# Patient Record
Sex: Female | Born: 1935
Health system: Southern US, Community
[De-identification: ages and names within clinical notes are randomized; demographics above are authoritative.]

## PROBLEM LIST (undated history)

## (undated) DIAGNOSIS — E785 Hyperlipidemia, unspecified: Secondary | ICD-10-CM

## (undated) DIAGNOSIS — M1990 Unspecified osteoarthritis, unspecified site: Secondary | ICD-10-CM

## (undated) DIAGNOSIS — K219 Gastro-esophageal reflux disease without esophagitis: Secondary | ICD-10-CM

## (undated) DIAGNOSIS — E039 Hypothyroidism, unspecified: Secondary | ICD-10-CM

## (undated) DIAGNOSIS — L570 Actinic keratosis: Secondary | ICD-10-CM

## (undated) DIAGNOSIS — J45909 Unspecified asthma, uncomplicated: Secondary | ICD-10-CM

## (undated) HISTORY — PX: THYROIDECTOMY: SHX17

## (undated) HISTORY — DX: Actinic keratosis: L57.0

## (undated) HISTORY — DX: Hyperlipidemia, unspecified: E78.5

## (undated) HISTORY — DX: Unspecified asthma, uncomplicated: J45.909

## (undated) HISTORY — PX: TOTAL KNEE ARTHROPLASTY: SHX125

## (undated) HISTORY — DX: Unspecified osteoarthritis, unspecified site: M19.90

## (undated) HISTORY — DX: Gastro-esophageal reflux disease without esophagitis: K21.9

## (undated) HISTORY — DX: Hypothyroidism, unspecified: E03.9

## (undated) HISTORY — PX: TONSILLECTOMY: SUR1361

## (undated) HISTORY — PX: VAGINAL HYSTERECTOMY: SUR661

## (undated) HISTORY — PX: CATARACT EXTRACTION: SUR2

---

## 2001-07-20 ENCOUNTER — Encounter: Payer: Self-pay | Admitting: Otolaryngology

## 2001-07-20 ENCOUNTER — Ambulatory Visit (HOSPITAL_COMMUNITY): Admission: RE | Admit: 2001-07-20 | Discharge: 2001-07-20 | Payer: Self-pay | Admitting: Otolaryngology

## 2002-07-09 ENCOUNTER — Encounter: Payer: Self-pay | Admitting: Family Medicine

## 2002-07-09 ENCOUNTER — Ambulatory Visit (HOSPITAL_COMMUNITY): Admission: RE | Admit: 2002-07-09 | Discharge: 2002-07-09 | Payer: Self-pay | Admitting: Family Medicine

## 2005-06-27 ENCOUNTER — Observation Stay (HOSPITAL_COMMUNITY): Admission: RE | Admit: 2005-06-27 | Discharge: 2005-06-29 | Payer: Self-pay | Admitting: Obstetrics and Gynecology

## 2005-06-27 ENCOUNTER — Encounter: Payer: Self-pay | Admitting: Obstetrics and Gynecology

## 2006-06-28 ENCOUNTER — Ambulatory Visit (HOSPITAL_COMMUNITY): Admission: RE | Admit: 2006-06-28 | Discharge: 2006-06-28 | Payer: Self-pay | Admitting: Ophthalmology

## 2010-06-01 ENCOUNTER — Ambulatory Visit (HOSPITAL_COMMUNITY): Admission: RE | Admit: 2010-06-01 | Discharge: 2010-06-01 | Payer: Self-pay | Admitting: Ophthalmology

## 2010-10-20 LAB — POCT I-STAT 4, (NA,K, GLUC, HGB,HCT)
Glucose, Bld: 104 mg/dL — ABNORMAL HIGH (ref 70–99)
HCT: 41 % (ref 36.0–46.0)
Hemoglobin: 13.9 g/dL (ref 12.0–15.0)
Potassium: 3.6 mEq/L (ref 3.5–5.1)
Sodium: 139 mEq/L (ref 135–145)

## 2010-12-24 NOTE — Group Therapy Note (Signed)
[place], Lindsey Butler               ACCOUNT NO.:  [id number]   MEDICAL RECORD NO.:  [id number]          PATIENT TYPE:  OBV   LOCATION:  A428                          FACILITY:  [facility]   PHYSICIAN:  Tilda Burrow, M.D. DATE OF BIRTH:  January 31, 1929   DATE OF PROCEDURE:  06/28/2005  DATE OF DISCHARGE:                                   PROGRESS NOTE   9 a.m., June 28, 2005.   [name] is having an unusually heavy amount of clots and blood per vagina.  She underwent posterior repair, hemorrhoidectomy, and enterocele repair  yesterday with vagina packing removed at midnight. She was taken to the  bathroom but discontinued due to grogginess from Phenergan given due to the  patient's nausea. She required 25 mg for the nausea, but this made her  extremely unsteady. Hemoglobin and hematocrit are 12.0 and 35.1 compared to  yesterday, 13.6 and 38.9, considerably reasonable for the hemodilution of  continuous IV at 100 cc an hour overnight. Urine output has been quite good  since Foley catheter was reinserted. Speculum exam shows that she has had  large amounts of clots at the vaginal apex which were extracted. Then we sat  there and watched and did not see any evidence of continued active bleeding.  Speculum was removed. We waited a few minutes and reinserted, and there is  indeed just a little bit of slow passive ooze. The fact that she cannot  identify an active site makes me think that she might respond to simple  packing for an additional period of time. Foley catheter is left in place.  Vaginal packing with Betadine-soaked gauze was inserted. Bowel sounds are  listened for and seem low normal activity. There is no hyperactivity, and  currently, patient has no bloating and specifically has no shoulder pain. We  will keep an additional day. Will give an additional dose of antibiotics due  to the manipulations and her elevated white count of 15,300 with 88  neutrophils. Convert to  admission.      Tilda Burrow, M.D.  Electronically Signed     JVF/MEDQ  D:  06/28/2005  T:  06/28/2005  Job:  [id number]

## 2010-12-24 NOTE — H&P (Signed)
NAMEMARKAN, CAZAREZ               ACCOUNT NO.:  [id number]   MEDICAL RECORD NO.:  [id number]          PATIENT TYPE:  AMB   LOCATION:  DAY                           FACILITY:  [facility]   PHYSICIAN:  Tilda Burrow, M.D. DATE OF BIRTH:  1929/05/10   DATE OF ADMISSION:  06/27/2005  DATE OF DISCHARGE:  LH                                HISTORY & PHYSICAL   ADMISSION DIAGNOSES:  1.  Rectocele.  2.  External hemorrhoids.  3.  Possible enterocele.   HISTORY OF PRESENT ILLNESS:  This 77 year old female is complaining of  symptomatic bulge at the vaginal introitus is found to have a rectocele  originating high in the posterior vaginal wall.  Digital rectal exam shows  that the lower one-third of the vagina is adequately supported, but above it  there is a large bulge that is distensible and stretches all the way to the  introitus.  There is no palpable enterocele, but there is possibility of  adjacent enterocele being encountered at the time of surgery.  The patient  has vaginal cuff laxity.  She additionally has an external hemorrhoid that  is symptomatic.  Plans are for trimming of the hemorrhoid at the time of the  rectocele repair.   PAST MEDICAL HISTORY:  1.  Osteoarthritis.  2.  GERD.  3.  Hypercholesterolemia.  4.  Hypothyroidism.  5.  Allergic rhinitis.   PAST SURGICAL HISTORY:  1.  Vaginal hysterectomy years ago.  2.  Tonsillectomy in 1950.  3.  Appendectomy in 1950.  4.  Left knee replacement.   The patient has had this rectocele since 2000, but it has only recently  become sufficiently uncomfortable for her to seek repair.   ALLERGIES:  None.   MEDICATIONS:  Nexium, Synthroid, Zeta.   PHYSICAL EXAMINATION:  GENERAL:  Slim, Caucasian female, alert and oriented  x3, who appears her stated age.  HEENT:  Pupils equal, round, and reactive.  NECK:  Supple.  Trachea midline.  Status post well-healed thyroidectomy  scar.  CARDIAC: Regular rate and rhythm.  No murmurs  appreciated.  LUNGS:  Clear.  ABDOMEN:  Nontender.  RECTAL/PELVIC:  High rectocele noted on digital rectal exam.  External  hemorrhoid present. Cervix and uterus absent.  Adnexa nontender.  External  hemorrhoid present anteriorly.   PLAN:  Posterior repair with hemorrhoidectomy June 27, 2005.      Tilda Burrow, M.D.  Electronically Signed     JVF/MEDQ  D:  06/22/2005  T:  06/22/2005  Job:  [id number]   cc:   [place], Attention: Dr. Reuel Boom

## 2010-12-24 NOTE — Op Note (Signed)
NAMEVIRGIL, Lindsey Butler               ACCOUNT NO.:  [id number]   MEDICAL RECORD NO.:  [id number]          PATIENT TYPE:  OBV   LOCATION:  A428                          FACILITY:  [facility]   PHYSICIAN:  Tilda Burrow, M.D. DATE OF BIRTH:  March 17, 1929   DATE OF PROCEDURE:  06/27/2005  DATE OF DISCHARGE:                                 OPERATIVE REPORT   PREOPERATIVE DIAGNOSIS:  Rectocele, possible enterocele, external  hemorrhoids.   POSTOPERATIVE DIAGNOSIS:  Rectocele, possible enterocele, external  hemorrhoids.   PROCEDURE:  Rectocele repair, enterocele repair, external hemorrhoidectomy.   SURGEON:  Dr. Emelda Fear.   ASSISTANT:  Small, R.N.   ANESTHESIA:  Spinal, Daniel, C.R.NA.   COMPLICATIONS:  None.   FINDINGS:  Extensive laxity in the perineal body and large enterocele in  front of a high rectocele, with good pelvic support tissues identified.   DETAILS OF PROCEDURE:  The patient was taken to the operating room, prepped  and draped for vaginal procedure with legs supported carefully with high  lithotomy support. The perineal body was prepped and draped. Large  hemorrhoidal skin tags noted. We used a vaginal bib to separate the rectal  area from the vaginal area. The patient had an enlarged bulging rectocele  which upon contact caused the expulsion of additional fecal material. This  did not contaminate the surgical field for the vaginal portion of the  procedure. We then proceeded with splitting of posterior vaginal mucosa at  the lower two thirds of the vagina from the hymen ring remnants upwards for  two thirds length of the vagina. We then proceeded to split the vaginal  mucosa off of the underlying connective tissue. There was an obvious  enterocele. Digital rectal exam could then be performed cautiously using  double-gloved technique, and we were able to identify the rectocele as  separate from the enterocele, opened the enterocele, peeled the enterocele  mucosa away  from the underlying connective tissue. Allis clamps could be  placed laterally at the small amount of remaining supportive tissues that  felt to represent the tips of the uterosacral ligament. These were grasped  and tagged with Allis clamps. The double-gloved hand was extracted, gloves  changed, and then proceeded with trimming the enterocele and placing a purse-  string around the edges of the remaining tissues. A series of horizontal  mattress sutures using 0 Vicryl was then performed to pull the remnants of  the uterosacral ligaments together in the midline, and then we used Allis  clamps down the side to pull perirectal tissues together in the midline to  rebuild the perineal body and the posterior rectovaginal septum. This  resulted in good tissue edge approximation and good anterior support. This  anterior process was performed with once again a double gloved right index  finger in the rectum in order to better identify the limits of bowel. At no  time was there any suspicion bowel preparation. The series of horizontal  mattress sutures were placed followed by trimming redundant vaginal mucosa,  and then 2-0 chromic was used to trim the vaginal mucosa posteriorly. We  elevated  the rectal tissues during the prior layers of sutures so this  entire process resulted in excellent tissue reapproximation. Once the  vaginal procedure had been completed, we placed a Foley catheter, obtained a  generous amount of clear urine, and placed Betadine-soaked vaginal packing.   Hemorrhoidectomy. The vaginal bib was then removed, a very loose elastic  redundant skin tissue was then removed cross clamping transversely, excising  the redundant tissue and then using interrupted 2-0 chromic sutures to pull  together the skin edges. The patient tolerated the procedure well. Of note,  she had three lateral large bulging external hemorrhoids which were much  more prominent now that she had finished her bowel  prep than before. We  considered whether to leave those or whether to attempt to repair those, but  considering they would require deeper work and that the bowel had not  resulted in an optimal evacuation and there had been contamination, we  decided to defer this until a later date with probable surgeon referral.      Tilda Burrow, M.D.  Electronically Signed     JVF/MEDQ  D:  06/27/2005  T:  06/27/2005  Job:  [id number]

## 2010-12-24 NOTE — Discharge Summary (Signed)
[place] Butler, Lindsey Butler               ACCOUNT NO.:  [id number]   MEDICAL RECORD NO.:  [id number]          PATIENT TYPE:  INP   LOCATION:  A428                          FACILITY:  [facility]   PHYSICIAN:  Tilda Burrow, M.D. DATE OF BIRTH:  09-20-1928   DATE OF ADMISSION:  06/27/2005  DATE OF DISCHARGE:  LH                                 DISCHARGE SUMMARY   ADMISSION DIAGNOSES:  1.  Rectocele.  2.  External hemorrhoids.  3.  Possible enterocele.   DISCHARGE DIAGNOSES:  1.  Rectocele.  2.  Enterocele.  3.  External hemorrhoids.   PROCEDURE:  Posterior repair, enterocele repair, hemorrhoidectomy, Tilda Burrow, M.D., June 27, 2005.   DISCHARGE MEDICATIONS:  1.  Tylox 0.1 mg p.o. daily.  2.  Lisinopril/hydrochlorothiazide 20 mg/12.5 mg p.o. daily.  3.  Zetia 10 mg p.o. daily.  4.  Nexium 40 mg p.o. daily.  5.  Doxycycline 100 mg b.i.d. for 5 days.   HOSPITAL SUMMARY:  This 77 year old female was admitted June 27, 2005  for posterior repair and hemorrhoidectomy with enterocele identified during  the surgery and closed as described in the operative note. Postoperative day  #1 was notable for a greater than expected amount of bleeding after the  vaginal packing was removed. She had a hemoglobin of 12, hematocrit 35  compared to her preoperative values of 13.6 and 38.9. speculum exam showed  that the bleeding was at the vaginal apex. There was no continued active  bleeding, but numerous clots had accumulated beneath the overnight vaginal  packing. She had repacking inserted for an additional precautionary 24 hours  with Foley catheter reinserted when she did not void. She did well over the  following 24  hours and had resolution of the initial postoperative nausea with tolerance  of a regular diet and remained afebrile with white count mildly elevated at  15,300. She was discharged home June 29, 2005 in excellent condition for  followup two weeks in our  office.      Tilda Burrow, M.D.  Electronically Signed     JVF/MEDQ  D:  06/29/2005  T:  06/29/2005  Job:  [id number]   cc:   [facility]   Donzetta Sprung  Fax: [phone]

## 2011-05-05 DIAGNOSIS — S93409A Sprain of unspecified ligament of unspecified ankle, initial encounter: Secondary | ICD-10-CM | POA: Insufficient documentation

## 2012-05-22 ENCOUNTER — Encounter: Payer: Self-pay | Admitting: Internal Medicine

## 2012-05-22 ENCOUNTER — Ambulatory Visit (INDEPENDENT_AMBULATORY_CARE_PROVIDER_SITE_OTHER): Payer: Medicare Other | Admitting: Internal Medicine

## 2012-05-22 VITALS — BP 142/86 | HR 93 | Temp 97.5°F | Ht 65.5 in | Wt 158.4 lb

## 2012-05-22 DIAGNOSIS — R05 Cough: Secondary | ICD-10-CM

## 2012-05-22 NOTE — Progress Notes (Signed)
Subjective:    Patient ID: Lindsey Butler, female    DOB: 1929-03-25, 78 y.o.   MRN: [id number]  HPI  PCP is DANIEL, TERRY, MD  IOV  78 year old Body mass index is 25.96 kg/(m^2).  reports that she has never smoked. Poor historian but outside chart review seems to correlate with her hx below    Reports cough and associated wheeze on and off x 2 years. Insidious onset. Stable since onset but with ups and downs. Coughs daily and mostly at day and only rarely at night. Quality of cough is dry. Aggravated by bending over and walking and wakinhg up early in morning. Improved at night when she sleeps, sitting quietly and also transiently by nebs. She gives hx of  prednisone bursts on and off x 2  Years with total 5 bursts with each course lasting 5-10 days. Currently on 4 month prednisone course that started 2 months ago and she has 2 more months to go. She feels cough is severe and driving her crazy. Visits PMD atleast q2-3 months for cough. RSI cough score is 38 and strongly indicates LPR cough  Does have associated symptoms of mucus in throat, gag but no clearing of throat  Note: she says she generally does not like taking medications  Other issues  1) BP: does not suffer from it and not on ace inhibitor  2) Allergies: denies allergy symptoms or dx or workup  3) Sinus - does have chronic  But intermittent runny nose for 2 years.  Rx periodic prednisone that helps. Rx claritin and singulair x 2 year - normal sense of smell  - no blocked nose  - no hx of ENT eval  - no hx if sinus CT scan  4) GERD  - admits to  "bad" GERD - Rx nexium that helps control well but without it bad GERD  - dx hiatal hernia - Dr Teena Dunk GI doc in [place], [place] 2 months ago  5) Pulmonary   - dx as asthma by PMD based on current symptoms x 1 year  - Rx nebs that does help cough but only transiently - per hx CXR are normal. No hx of CT chest. . Denies having had PFTs      Dr Gretta Cool Reflux Symptom Index (>  13-15 suggestive of LPR cough) 0 -> 5  =  none ->severe problem  Hoarseness of problem with voice 5  Clearing  Of Throat 4  Excess throat mucus or feeling of post nasal drip 4  Difficulty swallowing food, liquid or tablets 3  Cough after eating or lying down 4  Breathing difficulties or choking episodes 4  Troublesome or annoying cough 5  Sensation of something sticking in throat or lump in throat 5  Heartburn, chest pain, indigestion, or stomach acid coming up 4  TOTAL 38     Kouffman Reflux v Neurogenic Cough Differentiator Reflux Comments  Do you awaken from a sound sleep coughing violently?                            With trouble breathing?    Do you have choking episodes when you cannot  Get enough air, gasping for air ?                 Do you usually cough when you lie down into  The bed, or when you just lie down to rest ?  Not at night   Do you usually cough after meals or eating?         y   Do you cough when (or after) you bend over?    y   GERD SCORE  2   Kouffman Reflux v Neurogenic Cough Differentiator Neurogenic   Do you more-or-less cough all day long? y   Does change of temperature make you cough?    Does laughing or chuckling cause you to cough? y   Do fumes (perfume, automobile fumes, burned  Toast, etc.,) cause you to cough ?         Does speaking, singing, or talking on the phone cause you to cough   ?               y   Neurogenic/Airway score 3         Past Medical History  Diagnosis Date  . Asthma   . Hyperlipidemia   . GERD (gastroesophageal reflux disease)   . Osteoarthritis   . Hypothyroidism   . Actinic keratosis      Family History  Problem Relation Age of Onset  . Cancer Son   . Liver cancer Brother      History   Social History  . Marital Status: Married    Spouse Name: N/A    Number of Children: N/A  . Years of Education: N/A   Occupational History  . retired    Social History Main Topics  .  Smoking status: Never Smoker   . Smokeless tobacco: Not on file  . Alcohol Use: No  . Drug Use: No  . Sexually Active: Not on file   Other Topics Concern  . Not on file   Social History Narrative  . No narrative on file     No Known Allergies   No outpatient prescriptions prior to visit.    Current outpatient prescriptions:budesonide (PULMICORT) 0.5 MG/2ML nebulizer solution, Take 0.5 mg by nebulization 2 (two) times daily., Disp: , Rfl: ;  ipratropium-albuterol (DUONEB) 0.5-2.5 (3) MG/3ML SOLN, Take 3 mLs by nebulization every 6 (six) hours as needed., Disp: , Rfl: ;  levothyroxine (SYNTHROID, LEVOTHROID) 100 MCG tablet, Take 1 tablet by mouth Daily., Disp: , Rfl:  loratadine (CLARITIN) 10 MG tablet, Take 10 mg by mouth daily., Disp: , Rfl: ;  montelukast (SINGULAIR) 10 MG tablet, Take 1 tablet by mouth Daily., Disp: , Rfl: ;  NEXIUM 40 MG capsule, Take 1 tablet by mouth Daily., Disp: , Rfl: ;  predniSONE (DELTASONE) 20 MG tablet, Take 1 tablet by mouth Daily., Disp: , Rfl:    Review of Systems  Constitutional: Negative for fever and unexpected weight change.  HENT: Positive for congestion, rhinorrhea and postnasal drip. Negative for ear pain, nosebleeds, sore throat, sneezing, trouble swallowing, dental problem and sinus pressure.   Eyes: Negative for redness and itching.  Respiratory: Positive for cough, chest tightness, shortness of breath and wheezing.   Cardiovascular: Negative for palpitations and leg swelling.  Gastrointestinal: Negative for nausea and vomiting.  Genitourinary: Negative for dysuria.  Musculoskeletal: Negative for joint swelling.  Skin: Negative for rash.  Neurological: Negative for headaches.  Hematological: Does not bruise/bleed easily.  Psychiatric/Behavioral: Negative for dysphoric mood. The patient is not nervous/anxious.        Objective:   Physical Exam  Vitals reviewed. Constitutional: She is oriented to person, place, and time. She appears  well-developed and well-nourished. No distress.       Barking layrngeal Cough  and gag during interview  HENT:  Head: Normocephalic and atraumatic.  Right Ear: External ear normal.  Left Ear: External ear normal.  Mouth/Throat: Oropharynx is clear and moist. No oropharyngeal exudate.  Eyes: Conjunctivae normal and EOM are normal. Pupils are equal, round, and reactive to light. Right eye exhibits no discharge. Left eye exhibits no discharge. No scleral icterus.  Neck: Normal range of motion. Neck supple. No JVD present. No tracheal deviation present. No thyromegaly present.  Cardiovascular: Normal rate, regular rhythm, normal heart sounds and intact distal pulses.  Exam reveals no gallop and no friction rub.   No murmur heard. Pulmonary/Chest: Effort normal and breath sounds normal. No respiratory distress. She has no wheezes. She has no rales. She exhibits no tenderness.  Abdominal: Soft. Bowel sounds are normal. She exhibits no distension and no mass. There is no tenderness. There is no rebound and no guarding.  Musculoskeletal: Normal range of motion. She exhibits no edema and no tenderness.  Lymphadenopathy:    She has no cervical adenopathy.  Neurological: She is alert and oriented to person, place, and time. She has normal reflexes. No cranial nerve deficit. She exhibits normal muscle tone. Coordination normal.  Skin: Skin is warm and dry. No rash noted. She is not diaphoretic. No erythema. No pallor.  Psychiatric: She has a normal mood and affect. Her behavior is normal. Judgment and thought content normal.          Assessment & Plan:

## 2012-05-22 NOTE — Patient Instructions (Addendum)
In order to understand your cough better we need to get you off prednisone first STarting today, take prednisone 5mg  only on Tuesday 05/22/12, Thursday 05/24/12  and Saturday 05/26/12 Next week take prednisone on Tuesday 05/29/12 and Saturday 06/02/12 Then take prednisone 5mg  on Tuesday 06/05/12 as your last dose  No more prednisone after that Then mid November 2013 have full PFT breathing test and CT scan chest at [facility] See Dr Sandrea Hughs in [place], [place] for your cough- he is the cough guru of [place]

## 2012-05-23 DIAGNOSIS — R053 Chronic cough: Secondary | ICD-10-CM | POA: Insufficient documentation

## 2012-05-23 DIAGNOSIS — R05 Cough: Secondary | ICD-10-CM | POA: Insufficient documentation

## 2012-05-23 NOTE — Assessment & Plan Note (Signed)
Her cough is likely multifactorial and high cough score strongly suggests LPR or IRRITABLE LARYNX related cough. Unclear if she has true asthma or not. Will first slowly taper prednisone off over next 2 weeks. Then 2 weeks after that (4 weesk from now) get full PFT and CT chest wo contrast (rule out ILD) and she will see Dr Francella Solian the [facility] pulmonologist who comes to [place], [place]  Plan to fu with Dr Sherene Sires based on patient preference for shorter commute and Dr Sherene Sires experience/expertise with cough

## 2012-06-18 ENCOUNTER — Encounter: Payer: Self-pay | Admitting: Internal Medicine

## 2012-06-18 ENCOUNTER — Ambulatory Visit (HOSPITAL_COMMUNITY)
Admission: RE | Admit: 2012-06-18 | Discharge: 2012-06-18 | Disposition: A | Discharge status: Home / Self Care | Payer: Medicare Other | Source: Ambulatory Visit | Attending: Internal Medicine | Admitting: Internal Medicine

## 2012-06-18 ENCOUNTER — Telehealth: Payer: Self-pay | Admitting: Internal Medicine

## 2012-06-18 DIAGNOSIS — R05 Cough: Secondary | ICD-10-CM | POA: Insufficient documentation

## 2012-06-18 DIAGNOSIS — R9389 Abnormal findings on diagnostic imaging of other specified body structures: Secondary | ICD-10-CM | POA: Insufficient documentation

## 2012-06-18 DIAGNOSIS — R918 Other nonspecific abnormal finding of lung field: Secondary | ICD-10-CM | POA: Insufficient documentation

## 2012-06-18 DIAGNOSIS — R059 Cough, unspecified: Secondary | ICD-10-CM | POA: Insufficient documentation

## 2012-06-18 DIAGNOSIS — K219 Gastro-esophageal reflux disease without esophagitis: Secondary | ICD-10-CM | POA: Insufficient documentation

## 2012-06-18 DIAGNOSIS — E049 Nontoxic goiter, unspecified: Secondary | ICD-10-CM | POA: Insufficient documentation

## 2012-06-18 DIAGNOSIS — J45909 Unspecified asthma, uncomplicated: Secondary | ICD-10-CM | POA: Insufficient documentation

## 2012-06-18 MED ORDER — ALBUTEROL SULFATE (5 MG/ML) 0.5% IN NEBU
2.5000 mg | INHALATION_SOLUTION | Freq: Once | RESPIRATORY_TRACT | Status: AC
  Administered 2012-06-18: 2.5 mg via RESPIRATORY_TRACT

## 2012-06-18 NOTE — Telephone Encounter (Signed)
Ok to see in [place]- thx, will f/u on the ct

## 2012-06-18 NOTE — Procedures (Signed)
Lindsey Butler, Lindsey Butler               ACCOUNT NO.:  [id number]  MEDICAL RECORD NO.:  [id number]  LOCATION:  RAD                           FACILITY:  [facility]  PHYSICIAN:  Marvin Maenza L. Juanetta Gosling, M.D.DATE OF BIRTH:  1928-10-30  DATE OF PROCEDURE: DATE OF DISCHARGE:                           PULMONARY FUNCTION TEST   Reason for pulmonary function testing is chronic cough. 1. Spirometry shows a moderate ventilatory defect with evidence of     airflow obstruction. 2. Lung volumes show air trapping. 3. DLCO is moderately reduced. 4. Airway resistance is elevated confirming the presence of airflow     obstruction. 5. There is no significant bronchodilator improvement. 6. This study is consistent with COPD, despite apparently no smoking     history.  She did have exposure to tobacco at her workplace.     Jaece Ducharme L. Juanetta Gosling, M.D.     ELH/MEDQ  D:  06/18/2012  T:  06/18/2012  Job:  [id number]  cc:   Kalman Shan, MD [address] [place] [address] [place] [address]

## 2012-06-18 NOTE — Telephone Encounter (Signed)
thanks

## 2012-06-18 NOTE — Telephone Encounter (Signed)
Kathlene November  This lady I saw for cough. She lives near [place] and I have recommended she follow with you and also due to your cough expertise. You see her early dec 2013. I did CT chest on her due to poor hx quality. SHows some ? Focal boop LUL. I will tell her you will address it when you see her  Thanks  MR

## 2012-06-19 LAB — PULMONARY FUNCTION TEST

## 2012-06-22 NOTE — Progress Notes (Signed)
Quick Note:  Spoke with pt and notified of results per Dr.Ramaswamy. Pt verbalized understanding and denied any questions.  ______ 

## 2012-07-10 ENCOUNTER — Institutional Professional Consult (permissible substitution): Payer: Medicare Other | Admitting: Internal Medicine

## 2012-08-14 ENCOUNTER — Institutional Professional Consult (permissible substitution): Payer: Medicare Other | Admitting: Internal Medicine

## 2012-09-11 ENCOUNTER — Encounter: Payer: Self-pay | Admitting: Internal Medicine

## 2012-09-11 ENCOUNTER — Ambulatory Visit (INDEPENDENT_AMBULATORY_CARE_PROVIDER_SITE_OTHER): Payer: Medicare Other | Admitting: Internal Medicine

## 2012-09-11 ENCOUNTER — Institutional Professional Consult (permissible substitution): Payer: Medicare Other | Admitting: Internal Medicine

## 2012-09-11 VITALS — BP 160/100 | HR 92 | Temp 97.7°F | Ht 65.5 in | Wt 152.0 lb

## 2012-09-11 DIAGNOSIS — R05 Cough: Secondary | ICD-10-CM

## 2012-09-11 DIAGNOSIS — R9389 Abnormal findings on diagnostic imaging of other specified body structures: Secondary | ICD-10-CM

## 2012-09-11 DIAGNOSIS — J45909 Unspecified asthma, uncomplicated: Secondary | ICD-10-CM

## 2012-09-11 NOTE — Assessment & Plan Note (Addendum)
?   Could this be some form of BOOP or eosinophic process.  Before next round of prednisone rec check esr and cbc with diff and repeat cxr now

## 2012-09-11 NOTE — Patient Instructions (Addendum)
We will call you to set up a follow up CT sinus and cxr at [facility]  Nexium 40 mg Take 30-60 min before first meal of the day (plan A)  Reduce the albuterol by one half but continue to take the nebulizer twice daily.(plan B)  GERD (REFLUX)  is an extremely common cause of respiratory symptoms, many times with no significant heartburn at all.    It can be treated with medication, but also with lifestyle changes including avoidance of late meals, excessive alcohol, smoking cessation, and avoid fatty foods, chocolate, peppermint, colas, red wine, and acidic juices such as orange juice.  NO MINT OR MENTHOL PRODUCTS SO NO COUGH DROPS  USE SUGARLESS CANDY INSTEAD (jolley ranchers or Stover's)  NO OIL BASED VITAMINS - use powdered substitutes.  Please schedule a follow up office visit in 4 weeks, sooner if needed with all medications in hand separated into which medications are used as  Plan A = automatic, take no matter what Plan B = Backup, only take them when you feel like you need them.

## 2012-09-11 NOTE — Assessment & Plan Note (Signed)
The most common causes of chronic cough in immunocompetent adults include the following: upper airway cough syndrome (UACS), previously referred to as postnasal drip syndrome (PNDS), which is caused by variety of rhinosinus conditions; (2) asthma; (3) GERD; (4) chronic bronchitis from cigarette smoking or other inhaled environmental irritants; (5) nonasthmatic eosinophilic bronchitis; and (6) bronchiectasis.   These conditions, singly or in combination, have accounted for up to 94% of the causes of chronic cough in prospective studies.   Other conditions have constituted no >6% of the causes in prospective studies These have included bronchogenic carcinoma, chronic interstitial pneumonia, sarcoidosis, left ventricular failure, ACEI-induced cough, and aspiration from a condition associated with pharyngeal dysfunction.    Chronic cough is often simultaneously caused by more than one condition. A single cause has been found from 38 to 82% of the time, multiple causes from 18 to 62%. Multiply caused cough has been the result of three diseases up to 42% of the time.       Most likely this is either upper airway cough syndrome or some form of eosinophic / steroid responsive process or even BOOP with the prominent h/o rhinitis chronically favoring the former and the CT chest favoring the latter  The standardized cough guidelines published in Chest by [place] in 2006 are still the best available and consist of a multiple step process (up to 12!) , not a single office visit,  and are intended  to address this problem logically,  with an alogrithm dependent on response to empiric treatment at  each progressive step  to determine a specific diagnosis with  minimal addtional testing needed. Therefore if adherence is an issue or can't be accurately verified,  it's very unlikely the standard evaluation and treatment will be successful here.    Furthermore, response to therapy (other than acute cough  suppression, which should only be used short term with avoidance of narcotic containing cough syrups if possible), can be a gradual process for which the patient may perceive immediate benefit.  Unlike going to an eye doctor where the best perscription is almost always the first one and is immediately effective, this is almost never the case in the management of chronic cough syndromes. Therefore the patient needs to commit up front to consistently adhere to recommendations  for up to 6 weeks of therapy directed at the likely underlying problem(s) before the response can be reasonably evaluated.   Before we do anything else she'll need to return for full and accurate medication reconciliation  See instructions for specific recommendations which were reviewed directly with the patient who was given a copy with highlighter outlining the key components.

## 2012-09-11 NOTE — Progress Notes (Signed)
Subjective:    Patient ID: Lindsey Butler, female    DOB: 04/15/1929  MRN: [id number]  HPI  61 yowf never smoker with problems with sinus dz starting around mid 90's > sent by company doctors to "[facility] doctor" dx as sinus infection> p rx got better but required frequent otcs year round ever since then cough started around 2012.   05/22/12 1st pulmonary eva/ [facility]  cough and associated wheeze on and off x 2 years. Insidious onset. Stable since onset but with ups and downs. Coughs daily and mostly at day and only rarely at night. Quality of cough is dry. Aggravated by bending over and walking and waking up early in morning. Improved at night when she sleeps, sitting quietly and also transiently by nebs. She gives hx of  prednisone bursts on and off x 2  Years with total 5 bursts with each course lasting 5-10 days. Currently on 4 month prednisone course that started 2 months ago and she has 2 more months to go. She feels cough is severe and driving her crazy. Visits PMD atleast q2-3 months for cough. RSI cough score is 38 and strongly indicateed LPR cough  Does have associated symptoms of mucus in throat, gag but no clearing of throat  Note: she says she generally does not like taking medications  Other issues  1) BP: does not suffer from it and not on ace inhibitor  2) Allergies: denies allergy symptoms or dx or workup  3) Sinus - does have chronic  But intermittent runny nose for 2 years.  Rx periodic prednisone that helps. Rx claritin and singulair x 2 year - normal sense of smell  - no blocked nose  - no hx of ENT eval  - no hx if sinus CT scan  4) GERD  - admits to  "bad" GERD - Rx nexium that helps control well but without it bad GERD and takes randomly during the day  - dx hiatal hernia - Dr Teena Dunk GI doc in [place], [place] 2 months ago  5) Pulmonary   - dx as asthma by PMD based on current symptoms x 1 year  - Rx nebs that does help cough but only transiently - per hx  CXR are normal. No hx of CT chest. . Denies having had PFTs rec In order to understand your cough better we need to get you off prednisone first STarting today, take prednisone 5mg  only on Tuesday 05/22/12, Thursday 05/24/12  and Saturday 05/26/12 Next week take prednisone on Tuesday 05/29/12 and Saturday 06/02/12 Then take prednisone 5mg  on Tuesday 06/05/12 as your last dose  No more prednisone after that Then mid November 2013 have full PFT breathing test and CT [facility]   09/11/2012 f/u ov/Lindsey Butler cc cough progressively worse xsev weeks day> night cough and breathing transiently better only with prednisone but only for about a week - during that week though stopped all meds prior to  flare.   No arthralgias or purulent nasal discharge or overt HB.  Mostly sob when coughing, mostly dry or prod of min mucoid sputum.  Sleeping ok without nocturnal  or early am exacerbation  of respiratory  c/o's or need for noct saba. Also denies any obvious fluctuation of symptoms with weather or environmental changes or other aggravating or alleviating factors except as outlined above   Review of Systems  Constitutional: Negative for fever and unexpected weight change.  HENT: Positive for sore throat and sneezing. Negative  for ear pain, nosebleeds, congestion, rhinorrhea, trouble swallowing, dental problem, postnasal drip and sinus pressure.   Eyes: Negative for redness and itching.  Respiratory: Positive for cough and shortness of breath. Negative for chest tightness and wheezing.   Cardiovascular: Negative for palpitations and leg swelling.  Gastrointestinal: Negative for nausea and vomiting.  Genitourinary: Negative for dysuria.  Musculoskeletal: Negative for joint swelling.  Skin: Negative for rash.  Neurological: Positive for headaches.  Hematological: Does not bruise/bleed easily.  Psychiatric/Behavioral: Negative for dysphoric mood. The patient is not nervous/anxious.         Objective:   Physical Exam  amb wf with nasal tone to voice Wt Readings from Last 3 Encounters:  09/11/12 152 lb (68.947 kg)  05/22/12 158 lb 6.4 oz (71.85 kg)    HEENT: nl dentition, turbinates, and orophanx. Nl external ear canals without cough reflex   NECK :  without JVD/Nodes/TM/ nl carotid upstrokes bilaterally   LUNGS: no acc muscle use, clear to A and P bilaterally without cough on insp or exp maneuvers   CV:  RRR  no s3 or murmur or increase in P2, no edema   ABD:  soft and nontender with nl excursion in the supine position. No bruits or organomegaly, bowel sounds nl  MS:  warm without deformities, calf tenderness, cyanosis or clubbing  SKIN: warm and dry without lesions    NEURO:  alert, approp, no deficits    06/22/12 CT Chest 1. Patchy areas of pulmonary parenchymal ground-glass and airspace  consolidation, worse in the left upper lobe. Pattern is indicative  of organizing pneumonia.       Assessment & Plan:

## 2012-09-14 DIAGNOSIS — J45909 Unspecified asthma, uncomplicated: Secondary | ICD-10-CM | POA: Insufficient documentation

## 2012-09-14 NOTE — Assessment & Plan Note (Signed)
-   PFT's 06/18/12  FEV1  1.98 (54%) ratio 64 and DLCO 55 corrects to 91  Never smoker so clearly this is asthma that is difficult to control  DDX of  difficult airways managment all start with A and  include Adherence, Ace Inhibitors, Acid Reflux, Active Sinus Disease, Alpha 1 Antitripsin deficiency, Anxiety masquerading as Airways dz,  ABPA,  allergy(esp in young), Aspiration (esp in elderly), Adverse effects of DPI,  Active smokers, plus two Bs  = Bronchiectasis and Beta blocker use..and one C= CHF  Adherence is always the initial "prime suspect" and is a multilayered concern that requires a "trust but verify" approach in every patient - starting with knowing how to use medications, especially inhalers, correctly, keeping up with refills and understanding the fundamental difference between maintenance and prns vs those medications only taken for a very short course and then stopped and not refilled.    ? Acid reflux > max rx  ? Active or acute sinus dz > sinus ct next

## 2012-09-17 ENCOUNTER — Telehealth: Payer: Self-pay | Admitting: Internal Medicine

## 2012-09-17 ENCOUNTER — Encounter (HOSPITAL_COMMUNITY): Payer: Self-pay

## 2012-09-17 ENCOUNTER — Ambulatory Visit (HOSPITAL_COMMUNITY)
Admission: RE | Admit: 2012-09-17 | Discharge: 2012-09-17 | Disposition: A | Discharge status: Home / Self Care | Payer: Medicare Other | Source: Ambulatory Visit | Attending: Internal Medicine | Admitting: Internal Medicine

## 2012-09-17 DIAGNOSIS — R059 Cough, unspecified: Secondary | ICD-10-CM | POA: Insufficient documentation

## 2012-09-17 DIAGNOSIS — R51 Headache: Secondary | ICD-10-CM | POA: Insufficient documentation

## 2012-09-17 DIAGNOSIS — R05 Cough: Secondary | ICD-10-CM | POA: Insufficient documentation

## 2012-09-17 MED ORDER — PREDNISONE 10 MG PO TABS: ORAL_TABLET | ORAL | Status: DC

## 2012-09-17 NOTE — Telephone Encounter (Signed)
Pt aware of MW recs. rx has been sent to the pharmacy. Nothing further was needed

## 2012-09-17 NOTE — Telephone Encounter (Signed)
Prednisone 10 mg take  4 each am x 2 days,   2 each am x 2 days,  1 each am x2days and stop then needs ov to regroup.

## 2012-09-17 NOTE — Telephone Encounter (Signed)
I spoke with pt. She c/o wheezing, dry cough, and SOB is worse. She is going to [place] [facility] sinus at [facility] today at 3:00 and she lives in [place]. She is not able to come in today for OV. She is requetsingt o have something called in for the meantime. She is taking nexium daily. Pt was in for OV 2.4.14. Please advise MW thanks  No Known Allergies

## 2012-09-18 ENCOUNTER — Encounter: Payer: Self-pay | Admitting: Internal Medicine

## 2012-09-18 NOTE — Progress Notes (Signed)
Quick Note:  Spoke with pt and notified of results per Dr. Wert. Pt verbalized understanding and denied any questions.  ______ 

## 2012-10-01 DIAGNOSIS — R300 Dysuria: Secondary | ICD-10-CM | POA: Insufficient documentation

## 2013-10-28 DIAGNOSIS — M810 Age-related osteoporosis without current pathological fracture: Secondary | ICD-10-CM | POA: Insufficient documentation

## 2014-03-27 DIAGNOSIS — IMO0002 Reserved for concepts with insufficient information to code with codable children: Secondary | ICD-10-CM | POA: Insufficient documentation

## 2014-08-11 DIAGNOSIS — M5441 Lumbago with sciatica, right side: Secondary | ICD-10-CM | POA: Diagnosis not present

## 2014-08-12 DIAGNOSIS — H3562 Retinal hemorrhage, left eye: Secondary | ICD-10-CM | POA: Diagnosis not present

## 2014-08-12 DIAGNOSIS — H3532 Exudative age-related macular degeneration: Secondary | ICD-10-CM | POA: Diagnosis not present

## 2014-08-12 DIAGNOSIS — H3531 Nonexudative age-related macular degeneration: Secondary | ICD-10-CM | POA: Diagnosis not present

## 2014-08-12 DIAGNOSIS — H35052 Retinal neovascularization, unspecified, left eye: Secondary | ICD-10-CM | POA: Diagnosis not present

## 2014-09-01 DIAGNOSIS — M5137 Other intervertebral disc degeneration, lumbosacral region: Secondary | ICD-10-CM | POA: Diagnosis not present

## 2014-09-01 DIAGNOSIS — M461 Sacroiliitis, not elsewhere classified: Secondary | ICD-10-CM | POA: Diagnosis not present

## 2014-09-01 DIAGNOSIS — M4807 Spinal stenosis, lumbosacral region: Secondary | ICD-10-CM | POA: Diagnosis not present

## 2014-09-01 DIAGNOSIS — M4727 Other spondylosis with radiculopathy, lumbosacral region: Secondary | ICD-10-CM | POA: Diagnosis not present

## 2014-09-01 DIAGNOSIS — Z79891 Long term (current) use of opiate analgesic: Secondary | ICD-10-CM | POA: Diagnosis not present

## 2014-09-09 DIAGNOSIS — M4727 Other spondylosis with radiculopathy, lumbosacral region: Secondary | ICD-10-CM | POA: Diagnosis not present

## 2014-09-09 DIAGNOSIS — I10 Essential (primary) hypertension: Secondary | ICD-10-CM | POA: Diagnosis not present

## 2014-09-09 DIAGNOSIS — J45909 Unspecified asthma, uncomplicated: Secondary | ICD-10-CM | POA: Diagnosis not present

## 2014-09-09 DIAGNOSIS — M4720 Other spondylosis with radiculopathy, site unspecified: Secondary | ICD-10-CM | POA: Diagnosis not present

## 2014-09-09 DIAGNOSIS — M5137 Other intervertebral disc degeneration, lumbosacral region: Secondary | ICD-10-CM | POA: Diagnosis not present

## 2014-09-09 DIAGNOSIS — Z888 Allergy status to other drugs, medicaments and biological substances status: Secondary | ICD-10-CM | POA: Diagnosis not present

## 2014-09-09 DIAGNOSIS — K219 Gastro-esophageal reflux disease without esophagitis: Secondary | ICD-10-CM | POA: Diagnosis not present

## 2014-09-09 DIAGNOSIS — J328 Other chronic sinusitis: Secondary | ICD-10-CM | POA: Diagnosis not present

## 2014-09-09 DIAGNOSIS — Z79899 Other long term (current) drug therapy: Secondary | ICD-10-CM | POA: Diagnosis not present

## 2014-09-09 DIAGNOSIS — M461 Sacroiliitis, not elsewhere classified: Secondary | ICD-10-CM | POA: Diagnosis not present

## 2014-09-09 DIAGNOSIS — Z96653 Presence of artificial knee joint, bilateral: Secondary | ICD-10-CM | POA: Diagnosis not present

## 2014-09-09 DIAGNOSIS — G8929 Other chronic pain: Secondary | ICD-10-CM | POA: Diagnosis not present

## 2014-09-09 DIAGNOSIS — E039 Hypothyroidism, unspecified: Secondary | ICD-10-CM | POA: Diagnosis not present

## 2014-09-09 DIAGNOSIS — M4807 Spinal stenosis, lumbosacral region: Secondary | ICD-10-CM | POA: Diagnosis not present

## 2014-09-09 DIAGNOSIS — E785 Hyperlipidemia, unspecified: Secondary | ICD-10-CM | POA: Diagnosis not present

## 2014-09-16 DIAGNOSIS — H35052 Retinal neovascularization, unspecified, left eye: Secondary | ICD-10-CM | POA: Diagnosis not present

## 2014-09-16 DIAGNOSIS — H3532 Exudative age-related macular degeneration: Secondary | ICD-10-CM | POA: Diagnosis not present

## 2014-09-25 DIAGNOSIS — M25571 Pain in right ankle and joints of right foot: Secondary | ICD-10-CM | POA: Diagnosis not present

## 2014-09-25 DIAGNOSIS — M25562 Pain in left knee: Secondary | ICD-10-CM | POA: Diagnosis not present

## 2014-10-08 DIAGNOSIS — M4807 Spinal stenosis, lumbosacral region: Secondary | ICD-10-CM | POA: Diagnosis not present

## 2014-10-08 DIAGNOSIS — M4727 Other spondylosis with radiculopathy, lumbosacral region: Secondary | ICD-10-CM | POA: Diagnosis not present

## 2014-10-08 DIAGNOSIS — M5137 Other intervertebral disc degeneration, lumbosacral region: Secondary | ICD-10-CM | POA: Diagnosis not present

## 2014-10-08 DIAGNOSIS — Z79899 Other long term (current) drug therapy: Secondary | ICD-10-CM | POA: Diagnosis not present

## 2014-10-13 DIAGNOSIS — R2681 Unsteadiness on feet: Secondary | ICD-10-CM | POA: Diagnosis not present

## 2014-10-13 DIAGNOSIS — M461 Sacroiliitis, not elsewhere classified: Secondary | ICD-10-CM | POA: Diagnosis not present

## 2014-10-13 DIAGNOSIS — M5137 Other intervertebral disc degeneration, lumbosacral region: Secondary | ICD-10-CM | POA: Diagnosis not present

## 2014-10-13 DIAGNOSIS — M545 Low back pain: Secondary | ICD-10-CM | POA: Diagnosis not present

## 2014-10-13 DIAGNOSIS — M4807 Spinal stenosis, lumbosacral region: Secondary | ICD-10-CM | POA: Diagnosis not present

## 2014-10-15 DIAGNOSIS — M545 Low back pain: Secondary | ICD-10-CM | POA: Diagnosis not present

## 2014-10-15 DIAGNOSIS — M5137 Other intervertebral disc degeneration, lumbosacral region: Secondary | ICD-10-CM | POA: Diagnosis not present

## 2014-10-15 DIAGNOSIS — M461 Sacroiliitis, not elsewhere classified: Secondary | ICD-10-CM | POA: Diagnosis not present

## 2014-10-15 DIAGNOSIS — M4807 Spinal stenosis, lumbosacral region: Secondary | ICD-10-CM | POA: Diagnosis not present

## 2014-10-15 DIAGNOSIS — R2681 Unsteadiness on feet: Secondary | ICD-10-CM | POA: Diagnosis not present

## 2014-10-20 DIAGNOSIS — E039 Hypothyroidism, unspecified: Secondary | ICD-10-CM | POA: Diagnosis not present

## 2014-10-20 DIAGNOSIS — E782 Mixed hyperlipidemia: Secondary | ICD-10-CM | POA: Diagnosis not present

## 2014-10-20 DIAGNOSIS — K210 Gastro-esophageal reflux disease with esophagitis: Secondary | ICD-10-CM | POA: Diagnosis not present

## 2014-10-22 DIAGNOSIS — M461 Sacroiliitis, not elsewhere classified: Secondary | ICD-10-CM | POA: Diagnosis not present

## 2014-10-22 DIAGNOSIS — M4807 Spinal stenosis, lumbosacral region: Secondary | ICD-10-CM | POA: Diagnosis not present

## 2014-10-22 DIAGNOSIS — M5137 Other intervertebral disc degeneration, lumbosacral region: Secondary | ICD-10-CM | POA: Diagnosis not present

## 2014-10-22 DIAGNOSIS — R2681 Unsteadiness on feet: Secondary | ICD-10-CM | POA: Diagnosis not present

## 2014-10-22 DIAGNOSIS — M545 Low back pain: Secondary | ICD-10-CM | POA: Diagnosis not present

## 2014-10-27 DIAGNOSIS — J301 Allergic rhinitis due to pollen: Secondary | ICD-10-CM | POA: Diagnosis not present

## 2014-10-27 DIAGNOSIS — E782 Mixed hyperlipidemia: Secondary | ICD-10-CM | POA: Diagnosis not present

## 2014-10-27 DIAGNOSIS — E871 Hypo-osmolality and hyponatremia: Secondary | ICD-10-CM | POA: Diagnosis not present

## 2014-10-27 DIAGNOSIS — E039 Hypothyroidism, unspecified: Secondary | ICD-10-CM | POA: Diagnosis not present

## 2014-10-27 DIAGNOSIS — J449 Chronic obstructive pulmonary disease, unspecified: Secondary | ICD-10-CM | POA: Diagnosis not present

## 2014-10-28 DIAGNOSIS — H3532 Exudative age-related macular degeneration: Secondary | ICD-10-CM | POA: Diagnosis not present

## 2014-11-14 DIAGNOSIS — M810 Age-related osteoporosis without current pathological fracture: Secondary | ICD-10-CM | POA: Diagnosis not present

## 2014-11-14 DIAGNOSIS — M47816 Spondylosis without myelopathy or radiculopathy, lumbar region: Secondary | ICD-10-CM | POA: Diagnosis not present

## 2014-11-14 DIAGNOSIS — M4806 Spinal stenosis, lumbar region: Secondary | ICD-10-CM | POA: Diagnosis not present

## 2014-12-02 DIAGNOSIS — M810 Age-related osteoporosis without current pathological fracture: Secondary | ICD-10-CM | POA: Diagnosis not present

## 2014-12-09 DIAGNOSIS — H3532 Exudative age-related macular degeneration: Secondary | ICD-10-CM | POA: Diagnosis not present

## 2014-12-15 DIAGNOSIS — E039 Hypothyroidism, unspecified: Secondary | ICD-10-CM | POA: Diagnosis not present

## 2014-12-23 DIAGNOSIS — M4806 Spinal stenosis, lumbar region: Secondary | ICD-10-CM | POA: Diagnosis not present

## 2014-12-23 DIAGNOSIS — M47816 Spondylosis without myelopathy or radiculopathy, lumbar region: Secondary | ICD-10-CM | POA: Diagnosis not present

## 2014-12-23 DIAGNOSIS — M5136 Other intervertebral disc degeneration, lumbar region: Secondary | ICD-10-CM | POA: Diagnosis not present

## 2014-12-24 DIAGNOSIS — M4800 Spinal stenosis, site unspecified: Secondary | ICD-10-CM | POA: Diagnosis not present

## 2014-12-24 DIAGNOSIS — M4806 Spinal stenosis, lumbar region: Secondary | ICD-10-CM | POA: Diagnosis not present

## 2014-12-24 DIAGNOSIS — M47816 Spondylosis without myelopathy or radiculopathy, lumbar region: Secondary | ICD-10-CM | POA: Diagnosis not present

## 2015-01-16 DIAGNOSIS — J45909 Unspecified asthma, uncomplicated: Secondary | ICD-10-CM | POA: Diagnosis not present

## 2015-01-19 DIAGNOSIS — M4806 Spinal stenosis, lumbar region: Secondary | ICD-10-CM | POA: Diagnosis not present

## 2015-01-19 DIAGNOSIS — J449 Chronic obstructive pulmonary disease, unspecified: Secondary | ICD-10-CM | POA: Diagnosis not present

## 2015-01-19 DIAGNOSIS — E039 Hypothyroidism, unspecified: Secondary | ICD-10-CM | POA: Diagnosis not present

## 2015-01-19 DIAGNOSIS — Z7952 Long term (current) use of systemic steroids: Secondary | ICD-10-CM | POA: Diagnosis not present

## 2015-01-19 DIAGNOSIS — J45909 Unspecified asthma, uncomplicated: Secondary | ICD-10-CM | POA: Diagnosis not present

## 2015-01-19 DIAGNOSIS — Z96651 Presence of right artificial knee joint: Secondary | ICD-10-CM | POA: Diagnosis not present

## 2015-01-19 DIAGNOSIS — Z79891 Long term (current) use of opiate analgesic: Secondary | ICD-10-CM | POA: Diagnosis not present

## 2015-01-19 DIAGNOSIS — I10 Essential (primary) hypertension: Secondary | ICD-10-CM | POA: Diagnosis not present

## 2015-01-19 DIAGNOSIS — M4326 Fusion of spine, lumbar region: Secondary | ICD-10-CM | POA: Diagnosis not present

## 2015-01-19 DIAGNOSIS — K219 Gastro-esophageal reflux disease without esophagitis: Secondary | ICD-10-CM | POA: Diagnosis not present

## 2015-01-19 DIAGNOSIS — E780 Pure hypercholesterolemia: Secondary | ICD-10-CM | POA: Diagnosis not present

## 2015-01-19 DIAGNOSIS — M47816 Spondylosis without myelopathy or radiculopathy, lumbar region: Secondary | ICD-10-CM | POA: Diagnosis not present

## 2015-01-19 DIAGNOSIS — M47896 Other spondylosis, lumbar region: Secondary | ICD-10-CM | POA: Diagnosis not present

## 2015-01-19 DIAGNOSIS — Z7951 Long term (current) use of inhaled steroids: Secondary | ICD-10-CM | POA: Diagnosis not present

## 2015-01-19 DIAGNOSIS — M810 Age-related osteoporosis without current pathological fracture: Secondary | ICD-10-CM | POA: Diagnosis not present

## 2015-01-19 DIAGNOSIS — Z79899 Other long term (current) drug therapy: Secondary | ICD-10-CM | POA: Diagnosis not present

## 2015-01-19 DIAGNOSIS — Z888 Allergy status to other drugs, medicaments and biological substances status: Secondary | ICD-10-CM | POA: Diagnosis not present

## 2015-01-19 DIAGNOSIS — J329 Chronic sinusitis, unspecified: Secondary | ICD-10-CM | POA: Diagnosis not present

## 2015-01-23 DIAGNOSIS — J449 Chronic obstructive pulmonary disease, unspecified: Secondary | ICD-10-CM | POA: Diagnosis not present

## 2015-01-23 DIAGNOSIS — S52502D Unspecified fracture of the lower end of left radius, subsequent encounter for closed fracture with routine healing: Secondary | ICD-10-CM | POA: Diagnosis not present

## 2015-01-23 DIAGNOSIS — M4806 Spinal stenosis, lumbar region: Secondary | ICD-10-CM | POA: Diagnosis not present

## 2015-01-23 DIAGNOSIS — I10 Essential (primary) hypertension: Secondary | ICD-10-CM | POA: Diagnosis not present

## 2015-01-23 DIAGNOSIS — Z4789 Encounter for other orthopedic aftercare: Secondary | ICD-10-CM | POA: Diagnosis not present

## 2015-01-23 DIAGNOSIS — M4306 Spondylolysis, lumbar region: Secondary | ICD-10-CM | POA: Diagnosis not present

## 2015-01-24 DIAGNOSIS — Z4789 Encounter for other orthopedic aftercare: Secondary | ICD-10-CM | POA: Diagnosis not present

## 2015-01-24 DIAGNOSIS — M4806 Spinal stenosis, lumbar region: Secondary | ICD-10-CM | POA: Diagnosis not present

## 2015-01-24 DIAGNOSIS — M4306 Spondylolysis, lumbar region: Secondary | ICD-10-CM | POA: Diagnosis not present

## 2015-01-24 DIAGNOSIS — S52502D Unspecified fracture of the lower end of left radius, subsequent encounter for closed fracture with routine healing: Secondary | ICD-10-CM | POA: Diagnosis not present

## 2015-01-24 DIAGNOSIS — J449 Chronic obstructive pulmonary disease, unspecified: Secondary | ICD-10-CM | POA: Diagnosis not present

## 2015-01-24 DIAGNOSIS — I10 Essential (primary) hypertension: Secondary | ICD-10-CM | POA: Diagnosis not present

## 2015-01-26 DIAGNOSIS — Z888 Allergy status to other drugs, medicaments and biological substances status: Secondary | ICD-10-CM | POA: Diagnosis not present

## 2015-01-26 DIAGNOSIS — M159 Polyosteoarthritis, unspecified: Secondary | ICD-10-CM | POA: Diagnosis not present

## 2015-01-26 DIAGNOSIS — Z7952 Long term (current) use of systemic steroids: Secondary | ICD-10-CM | POA: Diagnosis not present

## 2015-01-26 DIAGNOSIS — R262 Difficulty in walking, not elsewhere classified: Secondary | ICD-10-CM | POA: Diagnosis not present

## 2015-01-26 DIAGNOSIS — M5416 Radiculopathy, lumbar region: Secondary | ICD-10-CM | POA: Diagnosis not present

## 2015-01-26 DIAGNOSIS — M179 Osteoarthritis of knee, unspecified: Secondary | ICD-10-CM | POA: Diagnosis not present

## 2015-01-26 DIAGNOSIS — Z7951 Long term (current) use of inhaled steroids: Secondary | ICD-10-CM | POA: Diagnosis not present

## 2015-01-26 DIAGNOSIS — I10 Essential (primary) hypertension: Secondary | ICD-10-CM | POA: Diagnosis not present

## 2015-01-26 DIAGNOSIS — E039 Hypothyroidism, unspecified: Secondary | ICD-10-CM | POA: Diagnosis not present

## 2015-01-26 DIAGNOSIS — M810 Age-related osteoporosis without current pathological fracture: Secondary | ICD-10-CM | POA: Diagnosis not present

## 2015-01-26 DIAGNOSIS — J45909 Unspecified asthma, uncomplicated: Secondary | ICD-10-CM | POA: Diagnosis not present

## 2015-01-26 DIAGNOSIS — M1712 Unilateral primary osteoarthritis, left knee: Secondary | ICD-10-CM | POA: Diagnosis not present

## 2015-01-26 DIAGNOSIS — Z79899 Other long term (current) drug therapy: Secondary | ICD-10-CM | POA: Diagnosis not present

## 2015-01-26 DIAGNOSIS — R269 Unspecified abnormalities of gait and mobility: Secondary | ICD-10-CM | POA: Diagnosis not present

## 2015-01-26 DIAGNOSIS — Z9181 History of falling: Secondary | ICD-10-CM | POA: Diagnosis not present

## 2015-01-26 DIAGNOSIS — I517 Cardiomegaly: Secondary | ICD-10-CM | POA: Diagnosis not present

## 2015-01-26 DIAGNOSIS — Z79891 Long term (current) use of opiate analgesic: Secondary | ICD-10-CM | POA: Diagnosis not present

## 2015-01-26 DIAGNOSIS — M25562 Pain in left knee: Secondary | ICD-10-CM | POA: Diagnosis not present

## 2015-01-26 DIAGNOSIS — K219 Gastro-esophageal reflux disease without esophagitis: Secondary | ICD-10-CM | POA: Diagnosis not present

## 2015-01-27 DIAGNOSIS — R262 Difficulty in walking, not elsewhere classified: Secondary | ICD-10-CM | POA: Diagnosis not present

## 2015-01-27 DIAGNOSIS — M179 Osteoarthritis of knee, unspecified: Secondary | ICD-10-CM | POA: Diagnosis not present

## 2015-01-28 DIAGNOSIS — M129 Arthropathy, unspecified: Secondary | ICD-10-CM | POA: Diagnosis not present

## 2015-01-28 DIAGNOSIS — R262 Difficulty in walking, not elsewhere classified: Secondary | ICD-10-CM | POA: Diagnosis not present

## 2015-01-28 DIAGNOSIS — M79605 Pain in left leg: Secondary | ICD-10-CM | POA: Diagnosis not present

## 2015-01-29 DIAGNOSIS — R262 Difficulty in walking, not elsewhere classified: Secondary | ICD-10-CM | POA: Diagnosis not present

## 2015-01-29 DIAGNOSIS — M5416 Radiculopathy, lumbar region: Secondary | ICD-10-CM | POA: Diagnosis not present

## 2015-01-29 DIAGNOSIS — Z9181 History of falling: Secondary | ICD-10-CM | POA: Diagnosis not present

## 2015-01-29 DIAGNOSIS — Z7952 Long term (current) use of systemic steroids: Secondary | ICD-10-CM | POA: Diagnosis not present

## 2015-01-29 DIAGNOSIS — M25562 Pain in left knee: Secondary | ICD-10-CM | POA: Diagnosis not present

## 2015-01-29 DIAGNOSIS — K5900 Constipation, unspecified: Secondary | ICD-10-CM | POA: Diagnosis not present

## 2015-01-29 DIAGNOSIS — M6281 Muscle weakness (generalized): Secondary | ICD-10-CM | POA: Diagnosis not present

## 2015-01-29 DIAGNOSIS — Z888 Allergy status to other drugs, medicaments and biological substances status: Secondary | ICD-10-CM | POA: Diagnosis not present

## 2015-01-29 DIAGNOSIS — Z79891 Long term (current) use of opiate analgesic: Secondary | ICD-10-CM | POA: Diagnosis not present

## 2015-01-29 DIAGNOSIS — M4856XA Collapsed vertebra, not elsewhere classified, lumbar region, initial encounter for fracture: Secondary | ICD-10-CM | POA: Diagnosis not present

## 2015-01-29 DIAGNOSIS — M159 Polyosteoarthritis, unspecified: Secondary | ICD-10-CM | POA: Diagnosis not present

## 2015-01-29 DIAGNOSIS — Z981 Arthrodesis status: Secondary | ICD-10-CM | POA: Diagnosis not present

## 2015-01-29 DIAGNOSIS — E039 Hypothyroidism, unspecified: Secondary | ICD-10-CM | POA: Diagnosis not present

## 2015-01-29 DIAGNOSIS — R52 Pain, unspecified: Secondary | ICD-10-CM | POA: Diagnosis not present

## 2015-01-29 DIAGNOSIS — I10 Essential (primary) hypertension: Secondary | ICD-10-CM | POA: Diagnosis not present

## 2015-01-29 DIAGNOSIS — K219 Gastro-esophageal reflux disease without esophagitis: Secondary | ICD-10-CM | POA: Diagnosis not present

## 2015-01-29 DIAGNOSIS — Z79899 Other long term (current) drug therapy: Secondary | ICD-10-CM | POA: Diagnosis not present

## 2015-01-29 DIAGNOSIS — Z7951 Long term (current) use of inhaled steroids: Secondary | ICD-10-CM | POA: Diagnosis not present

## 2015-01-29 DIAGNOSIS — M4806 Spinal stenosis, lumbar region: Secondary | ICD-10-CM | POA: Diagnosis not present

## 2015-01-29 DIAGNOSIS — J45909 Unspecified asthma, uncomplicated: Secondary | ICD-10-CM | POA: Diagnosis not present

## 2015-01-29 DIAGNOSIS — M47816 Spondylosis without myelopathy or radiculopathy, lumbar region: Secondary | ICD-10-CM | POA: Diagnosis not present

## 2015-01-29 DIAGNOSIS — M810 Age-related osteoporosis without current pathological fracture: Secondary | ICD-10-CM | POA: Diagnosis not present

## 2015-01-29 DIAGNOSIS — M4800 Spinal stenosis, site unspecified: Secondary | ICD-10-CM | POA: Diagnosis not present

## 2015-01-29 DIAGNOSIS — M179 Osteoarthritis of knee, unspecified: Secondary | ICD-10-CM | POA: Diagnosis not present

## 2015-02-01 DIAGNOSIS — M5416 Radiculopathy, lumbar region: Secondary | ICD-10-CM | POA: Diagnosis not present

## 2015-02-03 DIAGNOSIS — M4856XA Collapsed vertebra, not elsewhere classified, lumbar region, initial encounter for fracture: Secondary | ICD-10-CM | POA: Diagnosis not present

## 2015-02-03 DIAGNOSIS — Z981 Arthrodesis status: Secondary | ICD-10-CM | POA: Diagnosis not present

## 2015-02-08 DIAGNOSIS — M5416 Radiculopathy, lumbar region: Secondary | ICD-10-CM | POA: Diagnosis not present

## 2015-02-14 DIAGNOSIS — M5416 Radiculopathy, lumbar region: Secondary | ICD-10-CM | POA: Diagnosis not present

## 2015-02-20 DIAGNOSIS — I10 Essential (primary) hypertension: Secondary | ICD-10-CM | POA: Diagnosis not present

## 2015-02-20 DIAGNOSIS — M4306 Spondylolysis, lumbar region: Secondary | ICD-10-CM | POA: Diagnosis not present

## 2015-02-20 DIAGNOSIS — S52502D Unspecified fracture of the lower end of left radius, subsequent encounter for closed fracture with routine healing: Secondary | ICD-10-CM | POA: Diagnosis not present

## 2015-02-20 DIAGNOSIS — M4806 Spinal stenosis, lumbar region: Secondary | ICD-10-CM | POA: Diagnosis not present

## 2015-02-20 DIAGNOSIS — J449 Chronic obstructive pulmonary disease, unspecified: Secondary | ICD-10-CM | POA: Diagnosis not present

## 2015-02-20 DIAGNOSIS — Z4789 Encounter for other orthopedic aftercare: Secondary | ICD-10-CM | POA: Diagnosis not present

## 2015-02-23 DIAGNOSIS — M4806 Spinal stenosis, lumbar region: Secondary | ICD-10-CM | POA: Diagnosis not present

## 2015-02-23 DIAGNOSIS — I10 Essential (primary) hypertension: Secondary | ICD-10-CM | POA: Diagnosis not present

## 2015-02-23 DIAGNOSIS — S52502D Unspecified fracture of the lower end of left radius, subsequent encounter for closed fracture with routine healing: Secondary | ICD-10-CM | POA: Diagnosis not present

## 2015-02-23 DIAGNOSIS — M4306 Spondylolysis, lumbar region: Secondary | ICD-10-CM | POA: Diagnosis not present

## 2015-02-23 DIAGNOSIS — Z4789 Encounter for other orthopedic aftercare: Secondary | ICD-10-CM | POA: Diagnosis not present

## 2015-02-23 DIAGNOSIS — J449 Chronic obstructive pulmonary disease, unspecified: Secondary | ICD-10-CM | POA: Diagnosis not present

## 2015-02-24 DIAGNOSIS — S52502D Unspecified fracture of the lower end of left radius, subsequent encounter for closed fracture with routine healing: Secondary | ICD-10-CM | POA: Diagnosis not present

## 2015-02-24 DIAGNOSIS — M4306 Spondylolysis, lumbar region: Secondary | ICD-10-CM | POA: Diagnosis not present

## 2015-02-24 DIAGNOSIS — M4806 Spinal stenosis, lumbar region: Secondary | ICD-10-CM | POA: Diagnosis not present

## 2015-02-24 DIAGNOSIS — J449 Chronic obstructive pulmonary disease, unspecified: Secondary | ICD-10-CM | POA: Diagnosis not present

## 2015-02-24 DIAGNOSIS — Z4789 Encounter for other orthopedic aftercare: Secondary | ICD-10-CM | POA: Diagnosis not present

## 2015-02-24 DIAGNOSIS — I10 Essential (primary) hypertension: Secondary | ICD-10-CM | POA: Diagnosis not present

## 2015-02-26 DIAGNOSIS — I10 Essential (primary) hypertension: Secondary | ICD-10-CM | POA: Diagnosis not present

## 2015-02-26 DIAGNOSIS — J449 Chronic obstructive pulmonary disease, unspecified: Secondary | ICD-10-CM | POA: Diagnosis not present

## 2015-02-26 DIAGNOSIS — S52502D Unspecified fracture of the lower end of left radius, subsequent encounter for closed fracture with routine healing: Secondary | ICD-10-CM | POA: Diagnosis not present

## 2015-02-26 DIAGNOSIS — M4806 Spinal stenosis, lumbar region: Secondary | ICD-10-CM | POA: Diagnosis not present

## 2015-02-26 DIAGNOSIS — Z4789 Encounter for other orthopedic aftercare: Secondary | ICD-10-CM | POA: Diagnosis not present

## 2015-02-26 DIAGNOSIS — M4306 Spondylolysis, lumbar region: Secondary | ICD-10-CM | POA: Diagnosis not present

## 2015-02-27 DIAGNOSIS — Z4789 Encounter for other orthopedic aftercare: Secondary | ICD-10-CM | POA: Diagnosis not present

## 2015-02-27 DIAGNOSIS — M4806 Spinal stenosis, lumbar region: Secondary | ICD-10-CM | POA: Diagnosis not present

## 2015-02-27 DIAGNOSIS — J449 Chronic obstructive pulmonary disease, unspecified: Secondary | ICD-10-CM | POA: Diagnosis not present

## 2015-02-27 DIAGNOSIS — S52502D Unspecified fracture of the lower end of left radius, subsequent encounter for closed fracture with routine healing: Secondary | ICD-10-CM | POA: Diagnosis not present

## 2015-02-27 DIAGNOSIS — I10 Essential (primary) hypertension: Secondary | ICD-10-CM | POA: Diagnosis not present

## 2015-02-27 DIAGNOSIS — M4306 Spondylolysis, lumbar region: Secondary | ICD-10-CM | POA: Diagnosis not present

## 2015-03-02 DIAGNOSIS — J449 Chronic obstructive pulmonary disease, unspecified: Secondary | ICD-10-CM | POA: Diagnosis not present

## 2015-03-02 DIAGNOSIS — M4806 Spinal stenosis, lumbar region: Secondary | ICD-10-CM | POA: Diagnosis not present

## 2015-03-02 DIAGNOSIS — I10 Essential (primary) hypertension: Secondary | ICD-10-CM | POA: Diagnosis not present

## 2015-03-02 DIAGNOSIS — M4306 Spondylolysis, lumbar region: Secondary | ICD-10-CM | POA: Diagnosis not present

## 2015-03-02 DIAGNOSIS — S52502D Unspecified fracture of the lower end of left radius, subsequent encounter for closed fracture with routine healing: Secondary | ICD-10-CM | POA: Diagnosis not present

## 2015-03-02 DIAGNOSIS — Z4789 Encounter for other orthopedic aftercare: Secondary | ICD-10-CM | POA: Diagnosis not present

## 2015-03-05 DIAGNOSIS — S52502D Unspecified fracture of the lower end of left radius, subsequent encounter for closed fracture with routine healing: Secondary | ICD-10-CM | POA: Diagnosis not present

## 2015-03-05 DIAGNOSIS — M4806 Spinal stenosis, lumbar region: Secondary | ICD-10-CM | POA: Diagnosis not present

## 2015-03-05 DIAGNOSIS — Z4789 Encounter for other orthopedic aftercare: Secondary | ICD-10-CM | POA: Diagnosis not present

## 2015-03-05 DIAGNOSIS — I10 Essential (primary) hypertension: Secondary | ICD-10-CM | POA: Diagnosis not present

## 2015-03-05 DIAGNOSIS — J449 Chronic obstructive pulmonary disease, unspecified: Secondary | ICD-10-CM | POA: Diagnosis not present

## 2015-03-05 DIAGNOSIS — M4306 Spondylolysis, lumbar region: Secondary | ICD-10-CM | POA: Diagnosis not present

## 2015-03-09 DIAGNOSIS — M4856XD Collapsed vertebra, not elsewhere classified, lumbar region, subsequent encounter for fracture with routine healing: Secondary | ICD-10-CM | POA: Diagnosis not present

## 2015-03-09 DIAGNOSIS — Z9889 Other specified postprocedural states: Secondary | ICD-10-CM | POA: Diagnosis not present

## 2015-03-09 DIAGNOSIS — S32000A Wedge compression fracture of unspecified lumbar vertebra, initial encounter for closed fracture: Secondary | ICD-10-CM | POA: Diagnosis not present

## 2015-03-10 DIAGNOSIS — J449 Chronic obstructive pulmonary disease, unspecified: Secondary | ICD-10-CM | POA: Diagnosis not present

## 2015-03-10 DIAGNOSIS — S52502D Unspecified fracture of the lower end of left radius, subsequent encounter for closed fracture with routine healing: Secondary | ICD-10-CM | POA: Diagnosis not present

## 2015-03-10 DIAGNOSIS — Z4789 Encounter for other orthopedic aftercare: Secondary | ICD-10-CM | POA: Diagnosis not present

## 2015-03-10 DIAGNOSIS — M4806 Spinal stenosis, lumbar region: Secondary | ICD-10-CM | POA: Diagnosis not present

## 2015-03-10 DIAGNOSIS — I10 Essential (primary) hypertension: Secondary | ICD-10-CM | POA: Diagnosis not present

## 2015-03-10 DIAGNOSIS — M4306 Spondylolysis, lumbar region: Secondary | ICD-10-CM | POA: Diagnosis not present

## 2015-03-12 DIAGNOSIS — M4806 Spinal stenosis, lumbar region: Secondary | ICD-10-CM | POA: Diagnosis not present

## 2015-03-12 DIAGNOSIS — I10 Essential (primary) hypertension: Secondary | ICD-10-CM | POA: Diagnosis not present

## 2015-03-12 DIAGNOSIS — S52502D Unspecified fracture of the lower end of left radius, subsequent encounter for closed fracture with routine healing: Secondary | ICD-10-CM | POA: Diagnosis not present

## 2015-03-12 DIAGNOSIS — J449 Chronic obstructive pulmonary disease, unspecified: Secondary | ICD-10-CM | POA: Diagnosis not present

## 2015-03-12 DIAGNOSIS — Z4789 Encounter for other orthopedic aftercare: Secondary | ICD-10-CM | POA: Diagnosis not present

## 2015-03-12 DIAGNOSIS — M4306 Spondylolysis, lumbar region: Secondary | ICD-10-CM | POA: Diagnosis not present

## 2015-03-29 DIAGNOSIS — R609 Edema, unspecified: Secondary | ICD-10-CM | POA: Insufficient documentation

## 2015-03-30 DIAGNOSIS — R05 Cough: Secondary | ICD-10-CM | POA: Diagnosis not present

## 2015-03-30 DIAGNOSIS — W19XXXA Unspecified fall, initial encounter: Secondary | ICD-10-CM | POA: Diagnosis not present

## 2015-03-30 DIAGNOSIS — S8002XA Contusion of left knee, initial encounter: Secondary | ICD-10-CM | POA: Diagnosis not present

## 2015-03-30 DIAGNOSIS — J4541 Moderate persistent asthma with (acute) exacerbation: Secondary | ICD-10-CM | POA: Diagnosis not present

## 2015-03-30 DIAGNOSIS — S20212A Contusion of left front wall of thorax, initial encounter: Secondary | ICD-10-CM | POA: Diagnosis not present

## 2015-03-30 DIAGNOSIS — R600 Localized edema: Secondary | ICD-10-CM | POA: Diagnosis not present

## 2015-03-30 DIAGNOSIS — M25562 Pain in left knee: Secondary | ICD-10-CM | POA: Diagnosis not present

## 2015-03-30 DIAGNOSIS — R0602 Shortness of breath: Secondary | ICD-10-CM | POA: Diagnosis not present

## 2015-04-01 DIAGNOSIS — J301 Allergic rhinitis due to pollen: Secondary | ICD-10-CM | POA: Diagnosis not present

## 2015-04-01 DIAGNOSIS — M4806 Spinal stenosis, lumbar region: Secondary | ICD-10-CM | POA: Diagnosis not present

## 2015-04-01 DIAGNOSIS — E039 Hypothyroidism, unspecified: Secondary | ICD-10-CM | POA: Diagnosis not present

## 2015-04-01 DIAGNOSIS — E782 Mixed hyperlipidemia: Secondary | ICD-10-CM | POA: Diagnosis not present

## 2015-04-01 DIAGNOSIS — K219 Gastro-esophageal reflux disease without esophagitis: Secondary | ICD-10-CM | POA: Diagnosis not present

## 2015-04-03 DIAGNOSIS — E039 Hypothyroidism, unspecified: Secondary | ICD-10-CM | POA: Diagnosis not present

## 2015-04-03 DIAGNOSIS — J45909 Unspecified asthma, uncomplicated: Secondary | ICD-10-CM | POA: Diagnosis not present

## 2015-04-03 DIAGNOSIS — E780 Pure hypercholesterolemia: Secondary | ICD-10-CM | POA: Diagnosis not present

## 2015-04-03 DIAGNOSIS — S8011XA Contusion of right lower leg, initial encounter: Secondary | ICD-10-CM | POA: Diagnosis not present

## 2015-04-03 DIAGNOSIS — W109XXA Fall (on) (from) unspecified stairs and steps, initial encounter: Secondary | ICD-10-CM | POA: Diagnosis not present

## 2015-04-03 DIAGNOSIS — M79604 Pain in right leg: Secondary | ICD-10-CM | POA: Diagnosis not present

## 2015-04-03 DIAGNOSIS — M7989 Other specified soft tissue disorders: Secondary | ICD-10-CM | POA: Diagnosis not present

## 2015-04-03 DIAGNOSIS — Z79899 Other long term (current) drug therapy: Secondary | ICD-10-CM | POA: Diagnosis not present

## 2015-04-08 DIAGNOSIS — M6281 Muscle weakness (generalized): Secondary | ICD-10-CM | POA: Diagnosis not present

## 2015-04-08 DIAGNOSIS — M549 Dorsalgia, unspecified: Secondary | ICD-10-CM | POA: Diagnosis not present

## 2015-04-08 DIAGNOSIS — M79661 Pain in right lower leg: Secondary | ICD-10-CM | POA: Diagnosis not present

## 2015-04-10 DIAGNOSIS — M79661 Pain in right lower leg: Secondary | ICD-10-CM | POA: Diagnosis not present

## 2015-04-10 DIAGNOSIS — M549 Dorsalgia, unspecified: Secondary | ICD-10-CM | POA: Diagnosis not present

## 2015-04-10 DIAGNOSIS — M6281 Muscle weakness (generalized): Secondary | ICD-10-CM | POA: Diagnosis not present

## 2015-04-14 DIAGNOSIS — M549 Dorsalgia, unspecified: Secondary | ICD-10-CM | POA: Diagnosis not present

## 2015-04-14 DIAGNOSIS — M6281 Muscle weakness (generalized): Secondary | ICD-10-CM | POA: Diagnosis not present

## 2015-04-14 DIAGNOSIS — B351 Tinea unguium: Secondary | ICD-10-CM | POA: Insufficient documentation

## 2015-04-14 DIAGNOSIS — M79661 Pain in right lower leg: Secondary | ICD-10-CM | POA: Diagnosis not present

## 2015-04-15 DIAGNOSIS — S81801A Unspecified open wound, right lower leg, initial encounter: Secondary | ICD-10-CM | POA: Diagnosis not present

## 2015-04-15 DIAGNOSIS — B351 Tinea unguium: Secondary | ICD-10-CM | POA: Diagnosis not present

## 2015-04-16 DIAGNOSIS — M549 Dorsalgia, unspecified: Secondary | ICD-10-CM | POA: Diagnosis not present

## 2015-04-16 DIAGNOSIS — M79661 Pain in right lower leg: Secondary | ICD-10-CM | POA: Diagnosis not present

## 2015-04-16 DIAGNOSIS — M6281 Muscle weakness (generalized): Secondary | ICD-10-CM | POA: Diagnosis not present

## 2015-04-21 DIAGNOSIS — M549 Dorsalgia, unspecified: Secondary | ICD-10-CM | POA: Diagnosis not present

## 2015-04-21 DIAGNOSIS — M79661 Pain in right lower leg: Secondary | ICD-10-CM | POA: Diagnosis not present

## 2015-04-21 DIAGNOSIS — M6281 Muscle weakness (generalized): Secondary | ICD-10-CM | POA: Diagnosis not present

## 2015-04-23 DIAGNOSIS — M79661 Pain in right lower leg: Secondary | ICD-10-CM | POA: Diagnosis not present

## 2015-04-23 DIAGNOSIS — M6281 Muscle weakness (generalized): Secondary | ICD-10-CM | POA: Diagnosis not present

## 2015-04-23 DIAGNOSIS — M549 Dorsalgia, unspecified: Secondary | ICD-10-CM | POA: Diagnosis not present

## 2015-04-26 DIAGNOSIS — M549 Dorsalgia, unspecified: Secondary | ICD-10-CM | POA: Diagnosis not present

## 2015-04-26 DIAGNOSIS — M6281 Muscle weakness (generalized): Secondary | ICD-10-CM | POA: Diagnosis not present

## 2015-04-26 DIAGNOSIS — M79661 Pain in right lower leg: Secondary | ICD-10-CM | POA: Diagnosis not present

## 2015-04-27 DIAGNOSIS — M79661 Pain in right lower leg: Secondary | ICD-10-CM | POA: Diagnosis not present

## 2015-04-27 DIAGNOSIS — M6281 Muscle weakness (generalized): Secondary | ICD-10-CM | POA: Diagnosis not present

## 2015-04-27 DIAGNOSIS — M549 Dorsalgia, unspecified: Secondary | ICD-10-CM | POA: Diagnosis not present

## 2015-04-29 DIAGNOSIS — M47816 Spondylosis without myelopathy or radiculopathy, lumbar region: Secondary | ICD-10-CM | POA: Diagnosis not present

## 2015-04-29 DIAGNOSIS — M549 Dorsalgia, unspecified: Secondary | ICD-10-CM | POA: Diagnosis not present

## 2015-04-29 DIAGNOSIS — M6281 Muscle weakness (generalized): Secondary | ICD-10-CM | POA: Diagnosis not present

## 2015-04-29 DIAGNOSIS — M79661 Pain in right lower leg: Secondary | ICD-10-CM | POA: Diagnosis not present

## 2015-04-30 DIAGNOSIS — M6281 Muscle weakness (generalized): Secondary | ICD-10-CM | POA: Diagnosis not present

## 2015-04-30 DIAGNOSIS — M79661 Pain in right lower leg: Secondary | ICD-10-CM | POA: Diagnosis not present

## 2015-04-30 DIAGNOSIS — S81801A Unspecified open wound, right lower leg, initial encounter: Secondary | ICD-10-CM | POA: Diagnosis not present

## 2015-04-30 DIAGNOSIS — M549 Dorsalgia, unspecified: Secondary | ICD-10-CM | POA: Diagnosis not present

## 2015-05-04 DIAGNOSIS — M6281 Muscle weakness (generalized): Secondary | ICD-10-CM | POA: Diagnosis not present

## 2015-05-06 DIAGNOSIS — S81801A Unspecified open wound, right lower leg, initial encounter: Secondary | ICD-10-CM | POA: Diagnosis not present

## 2015-05-07 DIAGNOSIS — M549 Dorsalgia, unspecified: Secondary | ICD-10-CM | POA: Diagnosis not present

## 2015-05-07 DIAGNOSIS — M79661 Pain in right lower leg: Secondary | ICD-10-CM | POA: Diagnosis not present

## 2015-05-07 DIAGNOSIS — M6281 Muscle weakness (generalized): Secondary | ICD-10-CM | POA: Diagnosis not present

## 2015-05-13 DIAGNOSIS — M79661 Pain in right lower leg: Secondary | ICD-10-CM | POA: Diagnosis not present

## 2015-05-13 DIAGNOSIS — M549 Dorsalgia, unspecified: Secondary | ICD-10-CM | POA: Diagnosis not present

## 2015-05-13 DIAGNOSIS — M6281 Muscle weakness (generalized): Secondary | ICD-10-CM | POA: Diagnosis not present

## 2015-05-14 DIAGNOSIS — Z961 Presence of intraocular lens: Secondary | ICD-10-CM | POA: Diagnosis not present

## 2015-05-14 DIAGNOSIS — H353210 Exudative age-related macular degeneration, right eye, stage unspecified: Secondary | ICD-10-CM | POA: Diagnosis not present

## 2015-05-14 DIAGNOSIS — H4720 Unspecified optic atrophy: Secondary | ICD-10-CM | POA: Diagnosis not present

## 2015-05-21 DIAGNOSIS — M79661 Pain in right lower leg: Secondary | ICD-10-CM | POA: Diagnosis not present

## 2015-05-21 DIAGNOSIS — M549 Dorsalgia, unspecified: Secondary | ICD-10-CM | POA: Diagnosis not present

## 2015-05-21 DIAGNOSIS — M6281 Muscle weakness (generalized): Secondary | ICD-10-CM | POA: Diagnosis not present

## 2015-05-26 DIAGNOSIS — J4541 Moderate persistent asthma with (acute) exacerbation: Secondary | ICD-10-CM | POA: Diagnosis not present

## 2015-05-26 DIAGNOSIS — R062 Wheezing: Secondary | ICD-10-CM | POA: Diagnosis not present

## 2015-05-26 DIAGNOSIS — R05 Cough: Secondary | ICD-10-CM | POA: Diagnosis not present

## 2015-06-01 DIAGNOSIS — K219 Gastro-esophageal reflux disease without esophagitis: Secondary | ICD-10-CM | POA: Diagnosis not present

## 2015-06-01 DIAGNOSIS — J301 Allergic rhinitis due to pollen: Secondary | ICD-10-CM | POA: Diagnosis not present

## 2015-06-01 DIAGNOSIS — E782 Mixed hyperlipidemia: Secondary | ICD-10-CM | POA: Diagnosis not present

## 2015-06-01 DIAGNOSIS — M4806 Spinal stenosis, lumbar region: Secondary | ICD-10-CM | POA: Diagnosis not present

## 2015-06-01 DIAGNOSIS — E039 Hypothyroidism, unspecified: Secondary | ICD-10-CM | POA: Diagnosis not present

## 2015-06-03 DIAGNOSIS — K225 Diverticulum of esophagus, acquired: Secondary | ICD-10-CM | POA: Diagnosis not present

## 2015-06-03 DIAGNOSIS — K449 Diaphragmatic hernia without obstruction or gangrene: Secondary | ICD-10-CM | POA: Diagnosis not present

## 2015-06-03 DIAGNOSIS — R1310 Dysphagia, unspecified: Secondary | ICD-10-CM | POA: Diagnosis not present

## 2015-06-30 DIAGNOSIS — J4541 Moderate persistent asthma with (acute) exacerbation: Secondary | ICD-10-CM | POA: Diagnosis not present

## 2015-06-30 DIAGNOSIS — R05 Cough: Secondary | ICD-10-CM | POA: Diagnosis not present

## 2015-08-07 DIAGNOSIS — J40 Bronchitis, not specified as acute or chronic: Secondary | ICD-10-CM | POA: Diagnosis not present

## 2015-08-07 DIAGNOSIS — J4541 Moderate persistent asthma with (acute) exacerbation: Secondary | ICD-10-CM | POA: Diagnosis not present

## 2015-08-25 DIAGNOSIS — M4800 Spinal stenosis, site unspecified: Secondary | ICD-10-CM | POA: Diagnosis not present

## 2015-08-25 DIAGNOSIS — M4806 Spinal stenosis, lumbar region: Secondary | ICD-10-CM | POA: Diagnosis not present

## 2015-08-25 DIAGNOSIS — M47816 Spondylosis without myelopathy or radiculopathy, lumbar region: Secondary | ICD-10-CM | POA: Diagnosis not present

## 2015-09-06 DIAGNOSIS — R519 Headache, unspecified: Secondary | ICD-10-CM | POA: Insufficient documentation

## 2015-09-07 DIAGNOSIS — J069 Acute upper respiratory infection, unspecified: Secondary | ICD-10-CM | POA: Diagnosis not present

## 2015-09-07 DIAGNOSIS — R51 Headache: Secondary | ICD-10-CM | POA: Diagnosis not present

## 2015-09-07 DIAGNOSIS — J4541 Moderate persistent asthma with (acute) exacerbation: Secondary | ICD-10-CM | POA: Diagnosis not present

## 2015-09-09 DIAGNOSIS — J449 Chronic obstructive pulmonary disease, unspecified: Secondary | ICD-10-CM | POA: Diagnosis not present

## 2015-09-09 DIAGNOSIS — E782 Mixed hyperlipidemia: Secondary | ICD-10-CM | POA: Diagnosis not present

## 2015-09-09 DIAGNOSIS — K219 Gastro-esophageal reflux disease without esophagitis: Secondary | ICD-10-CM | POA: Diagnosis not present

## 2015-09-09 DIAGNOSIS — E039 Hypothyroidism, unspecified: Secondary | ICD-10-CM | POA: Diagnosis not present

## 2015-09-09 DIAGNOSIS — J301 Allergic rhinitis due to pollen: Secondary | ICD-10-CM | POA: Diagnosis not present

## 2015-10-06 DIAGNOSIS — J4541 Moderate persistent asthma with (acute) exacerbation: Secondary | ICD-10-CM | POA: Diagnosis not present

## 2015-10-06 DIAGNOSIS — J209 Acute bronchitis, unspecified: Secondary | ICD-10-CM | POA: Diagnosis not present

## 2015-10-28 DIAGNOSIS — J4541 Moderate persistent asthma with (acute) exacerbation: Secondary | ICD-10-CM | POA: Diagnosis not present

## 2015-11-25 DIAGNOSIS — J4541 Moderate persistent asthma with (acute) exacerbation: Secondary | ICD-10-CM | POA: Diagnosis not present

## 2015-11-26 DIAGNOSIS — E871 Hypo-osmolality and hyponatremia: Secondary | ICD-10-CM | POA: Diagnosis not present

## 2015-11-26 DIAGNOSIS — Z7952 Long term (current) use of systemic steroids: Secondary | ICD-10-CM | POA: Diagnosis not present

## 2015-11-26 DIAGNOSIS — M810 Age-related osteoporosis without current pathological fracture: Secondary | ICD-10-CM | POA: Diagnosis not present

## 2015-11-26 DIAGNOSIS — Z8489 Family history of other specified conditions: Secondary | ICD-10-CM | POA: Diagnosis not present

## 2015-11-26 DIAGNOSIS — I517 Cardiomegaly: Secondary | ICD-10-CM | POA: Diagnosis not present

## 2015-11-26 DIAGNOSIS — J9601 Acute respiratory failure with hypoxia: Secondary | ICD-10-CM | POA: Diagnosis not present

## 2015-11-26 DIAGNOSIS — E7800 Pure hypercholesterolemia, unspecified: Secondary | ICD-10-CM | POA: Diagnosis not present

## 2015-11-26 DIAGNOSIS — K219 Gastro-esophageal reflux disease without esophagitis: Secondary | ICD-10-CM | POA: Diagnosis not present

## 2015-11-26 DIAGNOSIS — M47896 Other spondylosis, lumbar region: Secondary | ICD-10-CM | POA: Diagnosis not present

## 2015-11-26 DIAGNOSIS — Z79899 Other long term (current) drug therapy: Secondary | ICD-10-CM | POA: Diagnosis not present

## 2015-11-26 DIAGNOSIS — J4552 Severe persistent asthma with status asthmaticus: Secondary | ICD-10-CM | POA: Diagnosis not present

## 2015-11-26 DIAGNOSIS — M5136 Other intervertebral disc degeneration, lumbar region: Secondary | ICD-10-CM | POA: Diagnosis not present

## 2015-11-26 DIAGNOSIS — J9602 Acute respiratory failure with hypercapnia: Secondary | ICD-10-CM | POA: Diagnosis not present

## 2015-11-26 DIAGNOSIS — Z96651 Presence of right artificial knee joint: Secondary | ICD-10-CM | POA: Diagnosis not present

## 2015-11-26 DIAGNOSIS — I272 Other secondary pulmonary hypertension: Secondary | ICD-10-CM | POA: Diagnosis not present

## 2015-11-26 DIAGNOSIS — Z888 Allergy status to other drugs, medicaments and biological substances status: Secondary | ICD-10-CM | POA: Diagnosis not present

## 2015-11-26 DIAGNOSIS — R0602 Shortness of breath: Secondary | ICD-10-CM | POA: Diagnosis not present

## 2015-11-26 DIAGNOSIS — J440 Chronic obstructive pulmonary disease with acute lower respiratory infection: Secondary | ICD-10-CM | POA: Diagnosis not present

## 2015-11-26 DIAGNOSIS — J45901 Unspecified asthma with (acute) exacerbation: Secondary | ICD-10-CM | POA: Diagnosis not present

## 2015-11-26 DIAGNOSIS — I5181 Takotsubo syndrome: Secondary | ICD-10-CM | POA: Diagnosis not present

## 2015-11-26 DIAGNOSIS — Z79891 Long term (current) use of opiate analgesic: Secondary | ICD-10-CM | POA: Diagnosis not present

## 2015-11-26 DIAGNOSIS — G4700 Insomnia, unspecified: Secondary | ICD-10-CM | POA: Diagnosis not present

## 2015-11-26 DIAGNOSIS — E039 Hypothyroidism, unspecified: Secondary | ICD-10-CM | POA: Diagnosis not present

## 2015-11-26 DIAGNOSIS — I358 Other nonrheumatic aortic valve disorders: Secondary | ICD-10-CM | POA: Diagnosis not present

## 2015-11-26 DIAGNOSIS — I5021 Acute systolic (congestive) heart failure: Secondary | ICD-10-CM | POA: Diagnosis not present

## 2015-12-03 DIAGNOSIS — K219 Gastro-esophageal reflux disease without esophagitis: Secondary | ICD-10-CM | POA: Diagnosis not present

## 2015-12-03 DIAGNOSIS — M5186 Other intervertebral disc disorders, lumbar region: Secondary | ICD-10-CM | POA: Diagnosis not present

## 2015-12-03 DIAGNOSIS — I5030 Unspecified diastolic (congestive) heart failure: Secondary | ICD-10-CM | POA: Diagnosis not present

## 2015-12-03 DIAGNOSIS — J4551 Severe persistent asthma with (acute) exacerbation: Secondary | ICD-10-CM | POA: Diagnosis not present

## 2015-12-03 DIAGNOSIS — I272 Other secondary pulmonary hypertension: Secondary | ICD-10-CM | POA: Diagnosis not present

## 2015-12-03 DIAGNOSIS — M1990 Unspecified osteoarthritis, unspecified site: Secondary | ICD-10-CM | POA: Diagnosis not present

## 2015-12-03 DIAGNOSIS — Z7952 Long term (current) use of systemic steroids: Secondary | ICD-10-CM | POA: Diagnosis not present

## 2015-12-03 DIAGNOSIS — Z7951 Long term (current) use of inhaled steroids: Secondary | ICD-10-CM | POA: Diagnosis not present

## 2015-12-03 DIAGNOSIS — I5181 Takotsubo syndrome: Secondary | ICD-10-CM | POA: Diagnosis not present

## 2015-12-03 DIAGNOSIS — E039 Hypothyroidism, unspecified: Secondary | ICD-10-CM | POA: Diagnosis not present

## 2015-12-07 DIAGNOSIS — M5186 Other intervertebral disc disorders, lumbar region: Secondary | ICD-10-CM | POA: Diagnosis not present

## 2015-12-07 DIAGNOSIS — K219 Gastro-esophageal reflux disease without esophagitis: Secondary | ICD-10-CM | POA: Diagnosis not present

## 2015-12-07 DIAGNOSIS — I5021 Acute systolic (congestive) heart failure: Secondary | ICD-10-CM | POA: Insufficient documentation

## 2015-12-07 DIAGNOSIS — M1990 Unspecified osteoarthritis, unspecified site: Secondary | ICD-10-CM | POA: Diagnosis not present

## 2015-12-07 DIAGNOSIS — Z7952 Long term (current) use of systemic steroids: Secondary | ICD-10-CM | POA: Diagnosis not present

## 2015-12-07 DIAGNOSIS — I5181 Takotsubo syndrome: Secondary | ICD-10-CM | POA: Diagnosis not present

## 2015-12-07 DIAGNOSIS — J4551 Severe persistent asthma with (acute) exacerbation: Secondary | ICD-10-CM | POA: Diagnosis not present

## 2015-12-07 DIAGNOSIS — I5030 Unspecified diastolic (congestive) heart failure: Secondary | ICD-10-CM | POA: Diagnosis not present

## 2015-12-07 DIAGNOSIS — E039 Hypothyroidism, unspecified: Secondary | ICD-10-CM | POA: Diagnosis not present

## 2015-12-07 DIAGNOSIS — Z7951 Long term (current) use of inhaled steroids: Secondary | ICD-10-CM | POA: Diagnosis not present

## 2015-12-07 DIAGNOSIS — I272 Other secondary pulmonary hypertension: Secondary | ICD-10-CM | POA: Diagnosis not present

## 2015-12-09 DIAGNOSIS — I272 Other secondary pulmonary hypertension: Secondary | ICD-10-CM | POA: Diagnosis not present

## 2015-12-09 DIAGNOSIS — Z7952 Long term (current) use of systemic steroids: Secondary | ICD-10-CM | POA: Diagnosis not present

## 2015-12-09 DIAGNOSIS — M5186 Other intervertebral disc disorders, lumbar region: Secondary | ICD-10-CM | POA: Diagnosis not present

## 2015-12-09 DIAGNOSIS — M1990 Unspecified osteoarthritis, unspecified site: Secondary | ICD-10-CM | POA: Diagnosis not present

## 2015-12-09 DIAGNOSIS — J4551 Severe persistent asthma with (acute) exacerbation: Secondary | ICD-10-CM | POA: Diagnosis not present

## 2015-12-09 DIAGNOSIS — I5181 Takotsubo syndrome: Secondary | ICD-10-CM | POA: Diagnosis not present

## 2015-12-09 DIAGNOSIS — Z7951 Long term (current) use of inhaled steroids: Secondary | ICD-10-CM | POA: Diagnosis not present

## 2015-12-09 DIAGNOSIS — K219 Gastro-esophageal reflux disease without esophagitis: Secondary | ICD-10-CM | POA: Diagnosis not present

## 2015-12-09 DIAGNOSIS — E039 Hypothyroidism, unspecified: Secondary | ICD-10-CM | POA: Diagnosis not present

## 2015-12-09 DIAGNOSIS — I5030 Unspecified diastolic (congestive) heart failure: Secondary | ICD-10-CM | POA: Diagnosis not present

## 2015-12-10 DIAGNOSIS — M1990 Unspecified osteoarthritis, unspecified site: Secondary | ICD-10-CM | POA: Diagnosis not present

## 2015-12-10 DIAGNOSIS — M5186 Other intervertebral disc disorders, lumbar region: Secondary | ICD-10-CM | POA: Diagnosis not present

## 2015-12-10 DIAGNOSIS — Z7951 Long term (current) use of inhaled steroids: Secondary | ICD-10-CM | POA: Diagnosis not present

## 2015-12-10 DIAGNOSIS — Z7952 Long term (current) use of systemic steroids: Secondary | ICD-10-CM | POA: Diagnosis not present

## 2015-12-10 DIAGNOSIS — E039 Hypothyroidism, unspecified: Secondary | ICD-10-CM | POA: Diagnosis not present

## 2015-12-10 DIAGNOSIS — K219 Gastro-esophageal reflux disease without esophagitis: Secondary | ICD-10-CM | POA: Diagnosis not present

## 2015-12-10 DIAGNOSIS — I5181 Takotsubo syndrome: Secondary | ICD-10-CM | POA: Diagnosis not present

## 2015-12-10 DIAGNOSIS — J4551 Severe persistent asthma with (acute) exacerbation: Secondary | ICD-10-CM | POA: Diagnosis not present

## 2015-12-10 DIAGNOSIS — I5030 Unspecified diastolic (congestive) heart failure: Secondary | ICD-10-CM | POA: Diagnosis not present

## 2015-12-10 DIAGNOSIS — I272 Other secondary pulmonary hypertension: Secondary | ICD-10-CM | POA: Diagnosis not present

## 2015-12-11 DIAGNOSIS — M1990 Unspecified osteoarthritis, unspecified site: Secondary | ICD-10-CM | POA: Diagnosis not present

## 2015-12-11 DIAGNOSIS — K219 Gastro-esophageal reflux disease without esophagitis: Secondary | ICD-10-CM | POA: Diagnosis not present

## 2015-12-11 DIAGNOSIS — I5181 Takotsubo syndrome: Secondary | ICD-10-CM | POA: Diagnosis not present

## 2015-12-11 DIAGNOSIS — J4551 Severe persistent asthma with (acute) exacerbation: Secondary | ICD-10-CM | POA: Diagnosis not present

## 2015-12-11 DIAGNOSIS — I272 Other secondary pulmonary hypertension: Secondary | ICD-10-CM | POA: Diagnosis not present

## 2015-12-11 DIAGNOSIS — I5030 Unspecified diastolic (congestive) heart failure: Secondary | ICD-10-CM | POA: Diagnosis not present

## 2015-12-11 DIAGNOSIS — E039 Hypothyroidism, unspecified: Secondary | ICD-10-CM | POA: Diagnosis not present

## 2015-12-11 DIAGNOSIS — Z7951 Long term (current) use of inhaled steroids: Secondary | ICD-10-CM | POA: Diagnosis not present

## 2015-12-11 DIAGNOSIS — M5186 Other intervertebral disc disorders, lumbar region: Secondary | ICD-10-CM | POA: Diagnosis not present

## 2015-12-11 DIAGNOSIS — Z7952 Long term (current) use of systemic steroids: Secondary | ICD-10-CM | POA: Diagnosis not present

## 2015-12-15 DIAGNOSIS — J4551 Severe persistent asthma with (acute) exacerbation: Secondary | ICD-10-CM | POA: Diagnosis not present

## 2015-12-15 DIAGNOSIS — Z7951 Long term (current) use of inhaled steroids: Secondary | ICD-10-CM | POA: Diagnosis not present

## 2015-12-15 DIAGNOSIS — I272 Other secondary pulmonary hypertension: Secondary | ICD-10-CM | POA: Diagnosis not present

## 2015-12-15 DIAGNOSIS — I5181 Takotsubo syndrome: Secondary | ICD-10-CM | POA: Diagnosis not present

## 2015-12-15 DIAGNOSIS — M5186 Other intervertebral disc disorders, lumbar region: Secondary | ICD-10-CM | POA: Diagnosis not present

## 2015-12-15 DIAGNOSIS — K219 Gastro-esophageal reflux disease without esophagitis: Secondary | ICD-10-CM | POA: Diagnosis not present

## 2015-12-15 DIAGNOSIS — M1990 Unspecified osteoarthritis, unspecified site: Secondary | ICD-10-CM | POA: Diagnosis not present

## 2015-12-15 DIAGNOSIS — Z7952 Long term (current) use of systemic steroids: Secondary | ICD-10-CM | POA: Diagnosis not present

## 2015-12-15 DIAGNOSIS — E039 Hypothyroidism, unspecified: Secondary | ICD-10-CM | POA: Diagnosis not present

## 2015-12-15 DIAGNOSIS — I5030 Unspecified diastolic (congestive) heart failure: Secondary | ICD-10-CM | POA: Diagnosis not present

## 2015-12-17 DIAGNOSIS — I272 Other secondary pulmonary hypertension: Secondary | ICD-10-CM | POA: Diagnosis not present

## 2015-12-17 DIAGNOSIS — Z7951 Long term (current) use of inhaled steroids: Secondary | ICD-10-CM | POA: Diagnosis not present

## 2015-12-17 DIAGNOSIS — E039 Hypothyroidism, unspecified: Secondary | ICD-10-CM | POA: Diagnosis not present

## 2015-12-17 DIAGNOSIS — M5186 Other intervertebral disc disorders, lumbar region: Secondary | ICD-10-CM | POA: Diagnosis not present

## 2015-12-17 DIAGNOSIS — Z7952 Long term (current) use of systemic steroids: Secondary | ICD-10-CM | POA: Diagnosis not present

## 2015-12-17 DIAGNOSIS — M1990 Unspecified osteoarthritis, unspecified site: Secondary | ICD-10-CM | POA: Diagnosis not present

## 2015-12-17 DIAGNOSIS — J4551 Severe persistent asthma with (acute) exacerbation: Secondary | ICD-10-CM | POA: Diagnosis not present

## 2015-12-17 DIAGNOSIS — I5030 Unspecified diastolic (congestive) heart failure: Secondary | ICD-10-CM | POA: Diagnosis not present

## 2015-12-17 DIAGNOSIS — I5181 Takotsubo syndrome: Secondary | ICD-10-CM | POA: Diagnosis not present

## 2015-12-17 DIAGNOSIS — K219 Gastro-esophageal reflux disease without esophagitis: Secondary | ICD-10-CM | POA: Diagnosis not present

## 2015-12-18 DIAGNOSIS — M1990 Unspecified osteoarthritis, unspecified site: Secondary | ICD-10-CM | POA: Diagnosis not present

## 2015-12-18 DIAGNOSIS — K219 Gastro-esophageal reflux disease without esophagitis: Secondary | ICD-10-CM | POA: Diagnosis not present

## 2015-12-18 DIAGNOSIS — Z7951 Long term (current) use of inhaled steroids: Secondary | ICD-10-CM | POA: Diagnosis not present

## 2015-12-18 DIAGNOSIS — I272 Other secondary pulmonary hypertension: Secondary | ICD-10-CM | POA: Diagnosis not present

## 2015-12-18 DIAGNOSIS — E039 Hypothyroidism, unspecified: Secondary | ICD-10-CM | POA: Diagnosis not present

## 2015-12-18 DIAGNOSIS — J4551 Severe persistent asthma with (acute) exacerbation: Secondary | ICD-10-CM | POA: Diagnosis not present

## 2015-12-18 DIAGNOSIS — M5186 Other intervertebral disc disorders, lumbar region: Secondary | ICD-10-CM | POA: Diagnosis not present

## 2015-12-18 DIAGNOSIS — I5030 Unspecified diastolic (congestive) heart failure: Secondary | ICD-10-CM | POA: Diagnosis not present

## 2015-12-18 DIAGNOSIS — I5181 Takotsubo syndrome: Secondary | ICD-10-CM | POA: Diagnosis not present

## 2015-12-18 DIAGNOSIS — Z7952 Long term (current) use of systemic steroids: Secondary | ICD-10-CM | POA: Diagnosis not present

## 2016-01-01 DIAGNOSIS — J440 Chronic obstructive pulmonary disease with acute lower respiratory infection: Secondary | ICD-10-CM | POA: Diagnosis not present

## 2016-01-01 DIAGNOSIS — I5021 Acute systolic (congestive) heart failure: Secondary | ICD-10-CM | POA: Diagnosis not present

## 2016-02-08 DIAGNOSIS — E782 Mixed hyperlipidemia: Secondary | ICD-10-CM | POA: Diagnosis not present

## 2016-02-08 DIAGNOSIS — K219 Gastro-esophageal reflux disease without esophagitis: Secondary | ICD-10-CM | POA: Diagnosis not present

## 2016-02-08 DIAGNOSIS — E039 Hypothyroidism, unspecified: Secondary | ICD-10-CM | POA: Diagnosis not present

## 2016-02-11 DIAGNOSIS — J449 Chronic obstructive pulmonary disease, unspecified: Secondary | ICD-10-CM | POA: Diagnosis not present

## 2016-02-11 DIAGNOSIS — K219 Gastro-esophageal reflux disease without esophagitis: Secondary | ICD-10-CM | POA: Diagnosis not present

## 2016-02-11 DIAGNOSIS — E782 Mixed hyperlipidemia: Secondary | ICD-10-CM | POA: Diagnosis not present

## 2016-02-11 DIAGNOSIS — E039 Hypothyroidism, unspecified: Secondary | ICD-10-CM | POA: Diagnosis not present

## 2016-02-11 DIAGNOSIS — J301 Allergic rhinitis due to pollen: Secondary | ICD-10-CM | POA: Diagnosis not present

## 2016-02-22 DIAGNOSIS — H02051 Trichiasis without entropian right upper eyelid: Secondary | ICD-10-CM | POA: Diagnosis not present

## 2016-02-22 DIAGNOSIS — H353222 Exudative age-related macular degeneration, left eye, with inactive choroidal neovascularization: Secondary | ICD-10-CM | POA: Diagnosis not present

## 2016-02-22 DIAGNOSIS — H00029 Hordeolum internum unspecified eye, unspecified eyelid: Secondary | ICD-10-CM | POA: Diagnosis not present

## 2016-02-22 DIAGNOSIS — H353211 Exudative age-related macular degeneration, right eye, with active choroidal neovascularization: Secondary | ICD-10-CM | POA: Diagnosis not present

## 2016-02-29 DIAGNOSIS — H3561 Retinal hemorrhage, right eye: Secondary | ICD-10-CM | POA: Diagnosis not present

## 2016-02-29 DIAGNOSIS — H353222 Exudative age-related macular degeneration, left eye, with inactive choroidal neovascularization: Secondary | ICD-10-CM | POA: Diagnosis not present

## 2016-02-29 DIAGNOSIS — H353132 Nonexudative age-related macular degeneration, bilateral, intermediate dry stage: Secondary | ICD-10-CM | POA: Diagnosis not present

## 2016-02-29 DIAGNOSIS — H353211 Exudative age-related macular degeneration, right eye, with active choroidal neovascularization: Secondary | ICD-10-CM | POA: Diagnosis not present

## 2016-02-29 DIAGNOSIS — H35362 Drusen (degenerative) of macula, left eye: Secondary | ICD-10-CM | POA: Diagnosis not present

## 2016-03-16 ENCOUNTER — Other Ambulatory Visit: Payer: Self-pay | Admitting: Pharmacist

## 2016-03-16 NOTE — Patient Outreach (Signed)
Outreach call to Corinna Capra regarding her request for follow up from the [facility] Medication Adherence Campaign. Called patient and phone rings, but patient does not answer. No voicemail available.  Harlow Asa, PharmD Clinical Pharmacist [facility] [phone]

## 2016-04-04 DIAGNOSIS — H353211 Exudative age-related macular degeneration, right eye, with active choroidal neovascularization: Secondary | ICD-10-CM | POA: Diagnosis not present

## 2016-04-12 DIAGNOSIS — J440 Chronic obstructive pulmonary disease with acute lower respiratory infection: Secondary | ICD-10-CM | POA: Diagnosis not present

## 2016-04-12 DIAGNOSIS — J209 Acute bronchitis, unspecified: Secondary | ICD-10-CM | POA: Diagnosis not present

## 2016-05-09 DIAGNOSIS — S7000XA Contusion of unspecified hip, initial encounter: Secondary | ICD-10-CM | POA: Insufficient documentation

## 2016-05-10 DIAGNOSIS — S7002XA Contusion of left hip, initial encounter: Secondary | ICD-10-CM | POA: Diagnosis not present

## 2016-05-13 DIAGNOSIS — E871 Hypo-osmolality and hyponatremia: Secondary | ICD-10-CM | POA: Diagnosis not present

## 2016-05-13 DIAGNOSIS — E782 Mixed hyperlipidemia: Secondary | ICD-10-CM | POA: Diagnosis not present

## 2016-05-16 DIAGNOSIS — H353222 Exudative age-related macular degeneration, left eye, with inactive choroidal neovascularization: Secondary | ICD-10-CM | POA: Diagnosis not present

## 2016-05-16 DIAGNOSIS — H353211 Exudative age-related macular degeneration, right eye, with active choroidal neovascularization: Secondary | ICD-10-CM | POA: Diagnosis not present

## 2016-06-13 DIAGNOSIS — S7002XD Contusion of left hip, subsequent encounter: Secondary | ICD-10-CM | POA: Diagnosis not present

## 2016-06-13 DIAGNOSIS — J440 Chronic obstructive pulmonary disease with acute lower respiratory infection: Secondary | ICD-10-CM | POA: Diagnosis not present

## 2016-06-23 DIAGNOSIS — J0100 Acute maxillary sinusitis, unspecified: Secondary | ICD-10-CM | POA: Diagnosis not present

## 2016-06-23 DIAGNOSIS — E039 Hypothyroidism, unspecified: Secondary | ICD-10-CM | POA: Diagnosis not present

## 2016-06-23 DIAGNOSIS — J4541 Moderate persistent asthma with (acute) exacerbation: Secondary | ICD-10-CM | POA: Diagnosis not present

## 2016-06-23 DIAGNOSIS — Z23 Encounter for immunization: Secondary | ICD-10-CM | POA: Diagnosis not present

## 2016-06-23 DIAGNOSIS — J449 Chronic obstructive pulmonary disease, unspecified: Secondary | ICD-10-CM | POA: Diagnosis not present

## 2016-06-23 DIAGNOSIS — E782 Mixed hyperlipidemia: Secondary | ICD-10-CM | POA: Diagnosis not present

## 2016-07-04 DIAGNOSIS — H353211 Exudative age-related macular degeneration, right eye, with active choroidal neovascularization: Secondary | ICD-10-CM | POA: Diagnosis not present

## 2016-07-06 DIAGNOSIS — M810 Age-related osteoporosis without current pathological fracture: Secondary | ICD-10-CM | POA: Diagnosis not present

## 2016-07-20 DIAGNOSIS — J441 Chronic obstructive pulmonary disease with (acute) exacerbation: Secondary | ICD-10-CM | POA: Diagnosis not present

## 2016-08-17 DIAGNOSIS — J441 Chronic obstructive pulmonary disease with (acute) exacerbation: Secondary | ICD-10-CM | POA: Diagnosis not present

## 2016-08-29 DIAGNOSIS — C44319 Basal cell carcinoma of skin of other parts of face: Secondary | ICD-10-CM | POA: Insufficient documentation

## 2016-08-30 DIAGNOSIS — C44311 Basal cell carcinoma of skin of nose: Secondary | ICD-10-CM | POA: Diagnosis not present

## 2016-09-15 DIAGNOSIS — I10 Essential (primary) hypertension: Secondary | ICD-10-CM | POA: Diagnosis not present

## 2016-09-15 DIAGNOSIS — J4550 Severe persistent asthma, uncomplicated: Secondary | ICD-10-CM | POA: Diagnosis not present

## 2016-09-26 ENCOUNTER — Other Ambulatory Visit (HOSPITAL_COMMUNITY): Payer: Self-pay | Admitting: Respiratory Therapy

## 2016-09-26 DIAGNOSIS — J441 Chronic obstructive pulmonary disease with (acute) exacerbation: Secondary | ICD-10-CM

## 2016-09-27 DIAGNOSIS — M48061 Spinal stenosis, lumbar region without neurogenic claudication: Secondary | ICD-10-CM | POA: Insufficient documentation

## 2016-09-28 DIAGNOSIS — J449 Chronic obstructive pulmonary disease, unspecified: Secondary | ICD-10-CM | POA: Diagnosis not present

## 2016-09-28 DIAGNOSIS — J301 Allergic rhinitis due to pollen: Secondary | ICD-10-CM | POA: Diagnosis not present

## 2016-09-28 DIAGNOSIS — K219 Gastro-esophageal reflux disease without esophagitis: Secondary | ICD-10-CM | POA: Diagnosis not present

## 2016-09-28 DIAGNOSIS — E039 Hypothyroidism, unspecified: Secondary | ICD-10-CM | POA: Diagnosis not present

## 2016-09-28 DIAGNOSIS — E782 Mixed hyperlipidemia: Secondary | ICD-10-CM | POA: Diagnosis not present

## 2016-10-05 ENCOUNTER — Other Ambulatory Visit (HOSPITAL_COMMUNITY)
Admission: RE | Admit: 2016-10-05 | Discharge: 2016-10-05 | Disposition: A | Discharge status: Home / Self Care | Payer: Medicare Other | Source: Ambulatory Visit | Attending: Pulmonary Disease | Admitting: Pulmonary Disease

## 2016-10-05 ENCOUNTER — Ambulatory Visit (HOSPITAL_COMMUNITY)
Admission: RE | Admit: 2016-10-05 | Discharge: 2016-10-05 | Disposition: A | Discharge status: Home / Self Care | Payer: Medicare Other | Source: Ambulatory Visit | Attending: Pulmonary Disease | Admitting: Pulmonary Disease

## 2016-10-05 ENCOUNTER — Other Ambulatory Visit (HOSPITAL_COMMUNITY): Payer: Self-pay | Admitting: Pulmonary Disease

## 2016-10-05 DIAGNOSIS — R942 Abnormal results of pulmonary function studies: Secondary | ICD-10-CM | POA: Insufficient documentation

## 2016-10-05 DIAGNOSIS — J4550 Severe persistent asthma, uncomplicated: Secondary | ICD-10-CM | POA: Insufficient documentation

## 2016-10-05 DIAGNOSIS — J441 Chronic obstructive pulmonary disease with (acute) exacerbation: Secondary | ICD-10-CM | POA: Diagnosis not present

## 2016-10-05 LAB — CBC
HCT: 39.1 % (ref 36.0–46.0)
Hemoglobin: 12.6 g/dL (ref 12.0–15.0)
MCH: 29.7 pg (ref 26.0–34.0)
MCHC: 32.2 g/dL (ref 30.0–36.0)
MCV: 92.2 fL (ref 78.0–100.0)
Platelets: 439 10*3/uL — ABNORMAL HIGH (ref 150–400)
RBC: 4.24 MIL/uL (ref 3.87–5.11)
RDW: 15.5 % (ref 11.5–15.5)
WBC: 9.6 10*3/uL (ref 4.0–10.5)

## 2016-10-05 MED ORDER — ALBUTEROL SULFATE (2.5 MG/3ML) 0.083% IN NEBU
2.5000 mg | INHALATION_SOLUTION | Freq: Once | RESPIRATORY_TRACT | Status: AC
  Administered 2016-10-05: 2.5 mg via RESPIRATORY_TRACT

## 2016-10-08 LAB — IGE: IgE (Immunoglobulin E), Serum: 171 IU/mL — ABNORMAL HIGH (ref 0–100)

## 2016-10-20 DIAGNOSIS — J4550 Severe persistent asthma, uncomplicated: Secondary | ICD-10-CM | POA: Diagnosis not present

## 2016-10-20 DIAGNOSIS — M1990 Unspecified osteoarthritis, unspecified site: Secondary | ICD-10-CM | POA: Diagnosis not present

## 2016-10-20 DIAGNOSIS — J019 Acute sinusitis, unspecified: Secondary | ICD-10-CM | POA: Diagnosis not present

## 2016-11-01 DIAGNOSIS — H353212 Exudative age-related macular degeneration, right eye, with inactive choroidal neovascularization: Secondary | ICD-10-CM | POA: Diagnosis not present

## 2016-11-01 DIAGNOSIS — H353114 Nonexudative age-related macular degeneration, right eye, advanced atrophic with subfoveal involvement: Secondary | ICD-10-CM | POA: Diagnosis not present

## 2016-11-01 DIAGNOSIS — H3561 Retinal hemorrhage, right eye: Secondary | ICD-10-CM | POA: Diagnosis not present

## 2016-11-01 DIAGNOSIS — H353222 Exudative age-related macular degeneration, left eye, with inactive choroidal neovascularization: Secondary | ICD-10-CM | POA: Diagnosis not present

## 2016-11-07 DIAGNOSIS — H40053 Ocular hypertension, bilateral: Secondary | ICD-10-CM | POA: Diagnosis not present

## 2016-11-07 DIAGNOSIS — H40013 Open angle with borderline findings, low risk, bilateral: Secondary | ICD-10-CM | POA: Diagnosis not present

## 2016-11-29 LAB — PULMONARY FUNCTION TEST
DL/VA % pred: 83 %
DL/VA: 4.02 ml/min/mmHg/L
DLCO COR % PRED: 59 %
DLCO COR: 14.52 ml/min/mmHg
DLCO UNC % PRED: 59 %
DLCO unc: 14.52 ml/min/mmHg
FEF 25-75 POST: 0.95 L/s
FEF 25-75 PRE: 0.99 L/s
FEF2575-%Change-Post: -4 %
FEF2575-%PRED-PRE: 90 %
FEF2575-%Pred-Post: 86 %
FEV1-%Change-Post: 0 %
FEV1-%PRED-POST: 76 %
FEV1-%Pred-Pre: 76 %
FEV1-POST: 1.33 L
FEV1-Pre: 1.33 L
FEV1FVC-%CHANGE-POST: 3 %
FEV1FVC-%Pred-Pre: 102 %
FEV6-%Change-Post: -3 %
FEV6-%Pred-Post: 77 %
FEV6-%Pred-Pre: 80 %
FEV6-Post: 1.71 L
FEV6-Pre: 1.77 L
FEV6FVC-%Pred-Post: 106 %
FEV6FVC-%Pred-Pre: 106 %
FVC-%Change-Post: -3 %
FVC-%PRED-POST: 72 %
FVC-%Pred-Pre: 75 %
FVC-Post: 1.71 L
FVC-Pre: 1.77 L
POST FEV1/FVC RATIO: 77 %
POST FEV6/FVC RATIO: 100 %
PRE FEV1/FVC RATIO: 75 %
Pre FEV6/FVC Ratio: 100 %
RV % pred: 110 %
RV: 2.78 L
TLC % PRED: 91 %
TLC: 4.62 L

## 2016-12-21 DIAGNOSIS — H40013 Open angle with borderline findings, low risk, bilateral: Secondary | ICD-10-CM | POA: Diagnosis not present

## 2016-12-21 DIAGNOSIS — H40053 Ocular hypertension, bilateral: Secondary | ICD-10-CM | POA: Diagnosis not present

## 2016-12-30 DIAGNOSIS — J441 Chronic obstructive pulmonary disease with (acute) exacerbation: Secondary | ICD-10-CM | POA: Diagnosis not present

## 2016-12-30 DIAGNOSIS — E782 Mixed hyperlipidemia: Secondary | ICD-10-CM | POA: Diagnosis not present

## 2016-12-30 DIAGNOSIS — M25511 Pain in right shoulder: Secondary | ICD-10-CM | POA: Diagnosis not present

## 2016-12-30 DIAGNOSIS — I5021 Acute systolic (congestive) heart failure: Secondary | ICD-10-CM | POA: Diagnosis not present

## 2016-12-30 DIAGNOSIS — E871 Hypo-osmolality and hyponatremia: Secondary | ICD-10-CM | POA: Diagnosis not present

## 2016-12-30 DIAGNOSIS — E039 Hypothyroidism, unspecified: Secondary | ICD-10-CM | POA: Diagnosis not present

## 2017-01-03 DIAGNOSIS — J301 Allergic rhinitis due to pollen: Secondary | ICD-10-CM | POA: Diagnosis not present

## 2017-01-03 DIAGNOSIS — J449 Chronic obstructive pulmonary disease, unspecified: Secondary | ICD-10-CM | POA: Diagnosis not present

## 2017-01-03 DIAGNOSIS — E039 Hypothyroidism, unspecified: Secondary | ICD-10-CM | POA: Diagnosis not present

## 2017-01-03 DIAGNOSIS — E782 Mixed hyperlipidemia: Secondary | ICD-10-CM | POA: Diagnosis not present

## 2017-01-03 DIAGNOSIS — K219 Gastro-esophageal reflux disease without esophagitis: Secondary | ICD-10-CM | POA: Diagnosis not present

## 2017-01-09 DIAGNOSIS — M75111 Incomplete rotator cuff tear or rupture of right shoulder, not specified as traumatic: Secondary | ICD-10-CM | POA: Diagnosis not present

## 2017-01-09 DIAGNOSIS — M7551 Bursitis of right shoulder: Secondary | ICD-10-CM | POA: Diagnosis not present

## 2017-01-09 DIAGNOSIS — M25561 Pain in right knee: Secondary | ICD-10-CM | POA: Diagnosis not present

## 2017-01-09 DIAGNOSIS — M19011 Primary osteoarthritis, right shoulder: Secondary | ICD-10-CM | POA: Diagnosis not present

## 2017-01-30 DIAGNOSIS — M75111 Incomplete rotator cuff tear or rupture of right shoulder, not specified as traumatic: Secondary | ICD-10-CM | POA: Diagnosis not present

## 2017-01-30 DIAGNOSIS — M19011 Primary osteoarthritis, right shoulder: Secondary | ICD-10-CM | POA: Diagnosis not present

## 2017-01-30 DIAGNOSIS — M7551 Bursitis of right shoulder: Secondary | ICD-10-CM | POA: Diagnosis not present

## 2017-05-08 DIAGNOSIS — H353222 Exudative age-related macular degeneration, left eye, with inactive choroidal neovascularization: Secondary | ICD-10-CM | POA: Diagnosis not present

## 2017-05-08 DIAGNOSIS — H3561 Retinal hemorrhage, right eye: Secondary | ICD-10-CM | POA: Diagnosis not present

## 2017-05-08 DIAGNOSIS — H353212 Exudative age-related macular degeneration, right eye, with inactive choroidal neovascularization: Secondary | ICD-10-CM | POA: Diagnosis not present

## 2017-05-08 DIAGNOSIS — H353114 Nonexudative age-related macular degeneration, right eye, advanced atrophic with subfoveal involvement: Secondary | ICD-10-CM | POA: Diagnosis not present

## 2017-05-25 DIAGNOSIS — J441 Chronic obstructive pulmonary disease with (acute) exacerbation: Secondary | ICD-10-CM | POA: Diagnosis not present

## 2017-05-25 DIAGNOSIS — R062 Wheezing: Secondary | ICD-10-CM | POA: Diagnosis not present

## 2017-06-06 DIAGNOSIS — Z23 Encounter for immunization: Secondary | ICD-10-CM | POA: Diagnosis not present

## 2017-06-20 DIAGNOSIS — Z961 Presence of intraocular lens: Secondary | ICD-10-CM | POA: Diagnosis not present

## 2017-06-20 DIAGNOSIS — H353131 Nonexudative age-related macular degeneration, bilateral, early dry stage: Secondary | ICD-10-CM | POA: Diagnosis not present

## 2017-08-24 DIAGNOSIS — M47816 Spondylosis without myelopathy or radiculopathy, lumbar region: Secondary | ICD-10-CM | POA: Diagnosis not present

## 2017-08-29 DIAGNOSIS — M419 Scoliosis, unspecified: Secondary | ICD-10-CM | POA: Diagnosis not present

## 2017-08-29 DIAGNOSIS — M47816 Spondylosis without myelopathy or radiculopathy, lumbar region: Secondary | ICD-10-CM | POA: Diagnosis not present

## 2017-08-29 DIAGNOSIS — Z981 Arthrodesis status: Secondary | ICD-10-CM | POA: Diagnosis not present

## 2017-08-29 DIAGNOSIS — M48061 Spinal stenosis, lumbar region without neurogenic claudication: Secondary | ICD-10-CM | POA: Diagnosis not present

## 2017-08-29 DIAGNOSIS — M4856XD Collapsed vertebra, not elsewhere classified, lumbar region, subsequent encounter for fracture with routine healing: Secondary | ICD-10-CM | POA: Diagnosis not present

## 2017-09-12 DIAGNOSIS — M47816 Spondylosis without myelopathy or radiculopathy, lumbar region: Secondary | ICD-10-CM | POA: Diagnosis not present

## 2017-09-13 DIAGNOSIS — L570 Actinic keratosis: Secondary | ICD-10-CM | POA: Diagnosis not present

## 2017-09-13 DIAGNOSIS — C44311 Basal cell carcinoma of skin of nose: Secondary | ICD-10-CM | POA: Diagnosis not present

## 2017-09-20 DIAGNOSIS — M5126 Other intervertebral disc displacement, lumbar region: Secondary | ICD-10-CM | POA: Diagnosis not present

## 2017-09-20 DIAGNOSIS — Z981 Arthrodesis status: Secondary | ICD-10-CM | POA: Diagnosis not present

## 2017-09-20 DIAGNOSIS — M47816 Spondylosis without myelopathy or radiculopathy, lumbar region: Secondary | ICD-10-CM | POA: Diagnosis not present

## 2017-09-22 DIAGNOSIS — J189 Pneumonia, unspecified organism: Secondary | ICD-10-CM | POA: Diagnosis not present

## 2017-09-22 DIAGNOSIS — J4521 Mild intermittent asthma with (acute) exacerbation: Secondary | ICD-10-CM | POA: Diagnosis not present

## 2017-09-26 DIAGNOSIS — H04123 Dry eye syndrome of bilateral lacrimal glands: Secondary | ICD-10-CM | POA: Diagnosis not present

## 2017-09-26 DIAGNOSIS — H524 Presbyopia: Secondary | ICD-10-CM | POA: Diagnosis not present

## 2017-10-05 DIAGNOSIS — S32030S Wedge compression fracture of third lumbar vertebra, sequela: Secondary | ICD-10-CM | POA: Diagnosis not present

## 2017-10-05 DIAGNOSIS — M47816 Spondylosis without myelopathy or radiculopathy, lumbar region: Secondary | ICD-10-CM | POA: Diagnosis not present

## 2017-10-12 DIAGNOSIS — H401131 Primary open-angle glaucoma, bilateral, mild stage: Secondary | ICD-10-CM | POA: Diagnosis not present

## 2017-10-12 DIAGNOSIS — Z9849 Cataract extraction status, unspecified eye: Secondary | ICD-10-CM | POA: Diagnosis not present

## 2017-11-07 DIAGNOSIS — Z6823 Body mass index (BMI) 23.0-23.9, adult: Secondary | ICD-10-CM | POA: Diagnosis not present

## 2017-11-07 DIAGNOSIS — M4850XA Collapsed vertebra, not elsewhere classified, site unspecified, initial encounter for fracture: Secondary | ICD-10-CM | POA: Diagnosis not present

## 2017-11-07 DIAGNOSIS — M5441 Lumbago with sciatica, right side: Secondary | ICD-10-CM | POA: Diagnosis not present

## 2017-11-07 DIAGNOSIS — J440 Chronic obstructive pulmonary disease with acute lower respiratory infection: Secondary | ICD-10-CM | POA: Diagnosis not present

## 2017-11-07 DIAGNOSIS — R0600 Dyspnea, unspecified: Secondary | ICD-10-CM | POA: Diagnosis not present

## 2017-11-14 DIAGNOSIS — E785 Hyperlipidemia, unspecified: Secondary | ICD-10-CM | POA: Diagnosis not present

## 2017-11-14 DIAGNOSIS — M810 Age-related osteoporosis without current pathological fracture: Secondary | ICD-10-CM | POA: Diagnosis not present

## 2017-11-14 DIAGNOSIS — J449 Chronic obstructive pulmonary disease, unspecified: Secondary | ICD-10-CM | POA: Diagnosis not present

## 2017-11-14 DIAGNOSIS — E039 Hypothyroidism, unspecified: Secondary | ICD-10-CM | POA: Diagnosis not present

## 2017-11-15 DIAGNOSIS — J449 Chronic obstructive pulmonary disease, unspecified: Secondary | ICD-10-CM | POA: Diagnosis not present

## 2017-11-15 DIAGNOSIS — E039 Hypothyroidism, unspecified: Secondary | ICD-10-CM | POA: Diagnosis not present

## 2017-11-15 DIAGNOSIS — E785 Hyperlipidemia, unspecified: Secondary | ICD-10-CM | POA: Diagnosis not present

## 2017-12-01 DIAGNOSIS — H401131 Primary open-angle glaucoma, bilateral, mild stage: Secondary | ICD-10-CM | POA: Diagnosis not present

## 2017-12-01 DIAGNOSIS — Z9849 Cataract extraction status, unspecified eye: Secondary | ICD-10-CM | POA: Diagnosis not present

## 2017-12-14 DIAGNOSIS — E782 Mixed hyperlipidemia: Secondary | ICD-10-CM | POA: Diagnosis not present

## 2017-12-14 DIAGNOSIS — K219 Gastro-esophageal reflux disease without esophagitis: Secondary | ICD-10-CM | POA: Diagnosis not present

## 2017-12-14 DIAGNOSIS — J301 Allergic rhinitis due to pollen: Secondary | ICD-10-CM | POA: Diagnosis not present

## 2017-12-14 DIAGNOSIS — J449 Chronic obstructive pulmonary disease, unspecified: Secondary | ICD-10-CM | POA: Diagnosis not present

## 2017-12-14 DIAGNOSIS — E039 Hypothyroidism, unspecified: Secondary | ICD-10-CM | POA: Diagnosis not present

## 2018-01-04 DIAGNOSIS — J449 Chronic obstructive pulmonary disease, unspecified: Secondary | ICD-10-CM | POA: Diagnosis not present

## 2018-01-04 DIAGNOSIS — E039 Hypothyroidism, unspecified: Secondary | ICD-10-CM | POA: Diagnosis not present

## 2018-01-04 DIAGNOSIS — E785 Hyperlipidemia, unspecified: Secondary | ICD-10-CM | POA: Diagnosis not present

## 2018-01-10 DIAGNOSIS — L570 Actinic keratosis: Secondary | ICD-10-CM | POA: Diagnosis not present

## 2018-01-10 DIAGNOSIS — Z85828 Personal history of other malignant neoplasm of skin: Secondary | ICD-10-CM | POA: Diagnosis not present

## 2018-01-18 DIAGNOSIS — M47816 Spondylosis without myelopathy or radiculopathy, lumbar region: Secondary | ICD-10-CM | POA: Diagnosis not present

## 2018-01-18 DIAGNOSIS — S32030S Wedge compression fracture of third lumbar vertebra, sequela: Secondary | ICD-10-CM | POA: Diagnosis not present

## 2018-02-02 DIAGNOSIS — E039 Hypothyroidism, unspecified: Secondary | ICD-10-CM | POA: Diagnosis not present

## 2018-02-02 DIAGNOSIS — E785 Hyperlipidemia, unspecified: Secondary | ICD-10-CM | POA: Diagnosis not present

## 2018-02-02 DIAGNOSIS — J449 Chronic obstructive pulmonary disease, unspecified: Secondary | ICD-10-CM | POA: Diagnosis not present

## 2018-02-02 DIAGNOSIS — H401131 Primary open-angle glaucoma, bilateral, mild stage: Secondary | ICD-10-CM | POA: Diagnosis not present

## 2018-02-16 DIAGNOSIS — K210 Gastro-esophageal reflux disease with esophagitis: Secondary | ICD-10-CM | POA: Diagnosis not present

## 2018-02-16 DIAGNOSIS — E039 Hypothyroidism, unspecified: Secondary | ICD-10-CM | POA: Diagnosis not present

## 2018-02-16 DIAGNOSIS — J449 Chronic obstructive pulmonary disease, unspecified: Secondary | ICD-10-CM | POA: Diagnosis not present

## 2018-02-16 DIAGNOSIS — E871 Hypo-osmolality and hyponatremia: Secondary | ICD-10-CM | POA: Diagnosis not present

## 2018-02-16 DIAGNOSIS — E782 Mixed hyperlipidemia: Secondary | ICD-10-CM | POA: Diagnosis not present

## 2018-02-20 DIAGNOSIS — E782 Mixed hyperlipidemia: Secondary | ICD-10-CM | POA: Diagnosis not present

## 2018-02-20 DIAGNOSIS — E039 Hypothyroidism, unspecified: Secondary | ICD-10-CM | POA: Diagnosis not present

## 2018-02-20 DIAGNOSIS — J449 Chronic obstructive pulmonary disease, unspecified: Secondary | ICD-10-CM | POA: Diagnosis not present

## 2018-02-20 DIAGNOSIS — J301 Allergic rhinitis due to pollen: Secondary | ICD-10-CM | POA: Diagnosis not present

## 2018-02-20 DIAGNOSIS — Z23 Encounter for immunization: Secondary | ICD-10-CM | POA: Diagnosis not present

## 2018-02-20 DIAGNOSIS — Z0001 Encounter for general adult medical examination with abnormal findings: Secondary | ICD-10-CM | POA: Diagnosis not present

## 2018-03-06 DIAGNOSIS — E782 Mixed hyperlipidemia: Secondary | ICD-10-CM | POA: Diagnosis not present

## 2018-03-06 DIAGNOSIS — E039 Hypothyroidism, unspecified: Secondary | ICD-10-CM | POA: Diagnosis not present

## 2018-03-06 DIAGNOSIS — J449 Chronic obstructive pulmonary disease, unspecified: Secondary | ICD-10-CM | POA: Diagnosis not present

## 2018-03-06 DIAGNOSIS — K219 Gastro-esophageal reflux disease without esophagitis: Secondary | ICD-10-CM | POA: Diagnosis not present

## 2018-03-23 DIAGNOSIS — H40052 Ocular hypertension, left eye: Secondary | ICD-10-CM | POA: Diagnosis not present

## 2018-03-23 DIAGNOSIS — H401123 Primary open-angle glaucoma, left eye, severe stage: Secondary | ICD-10-CM | POA: Diagnosis not present

## 2018-03-23 DIAGNOSIS — H04123 Dry eye syndrome of bilateral lacrimal glands: Secondary | ICD-10-CM | POA: Diagnosis not present

## 2018-03-23 DIAGNOSIS — H401111 Primary open-angle glaucoma, right eye, mild stage: Secondary | ICD-10-CM | POA: Diagnosis not present

## 2018-04-04 DIAGNOSIS — K219 Gastro-esophageal reflux disease without esophagitis: Secondary | ICD-10-CM | POA: Diagnosis not present

## 2018-04-04 DIAGNOSIS — H409 Unspecified glaucoma: Secondary | ICD-10-CM | POA: Diagnosis not present

## 2018-04-04 DIAGNOSIS — J449 Chronic obstructive pulmonary disease, unspecified: Secondary | ICD-10-CM | POA: Diagnosis not present

## 2018-05-01 DIAGNOSIS — M47816 Spondylosis without myelopathy or radiculopathy, lumbar region: Secondary | ICD-10-CM | POA: Diagnosis not present

## 2018-05-03 DIAGNOSIS — H401111 Primary open-angle glaucoma, right eye, mild stage: Secondary | ICD-10-CM | POA: Diagnosis not present

## 2018-05-03 DIAGNOSIS — H401123 Primary open-angle glaucoma, left eye, severe stage: Secondary | ICD-10-CM | POA: Diagnosis not present

## 2018-05-03 DIAGNOSIS — H40052 Ocular hypertension, left eye: Secondary | ICD-10-CM | POA: Diagnosis not present

## 2018-05-07 DIAGNOSIS — I10 Essential (primary) hypertension: Secondary | ICD-10-CM | POA: Diagnosis not present

## 2018-05-07 DIAGNOSIS — K219 Gastro-esophageal reflux disease without esophagitis: Secondary | ICD-10-CM | POA: Diagnosis not present

## 2018-05-14 DIAGNOSIS — Z85828 Personal history of other malignant neoplasm of skin: Secondary | ICD-10-CM | POA: Diagnosis not present

## 2018-05-14 DIAGNOSIS — D485 Neoplasm of uncertain behavior of skin: Secondary | ICD-10-CM | POA: Diagnosis not present

## 2018-05-14 DIAGNOSIS — C44311 Basal cell carcinoma of skin of nose: Secondary | ICD-10-CM | POA: Diagnosis not present

## 2018-05-14 DIAGNOSIS — D0439 Carcinoma in situ of skin of other parts of face: Secondary | ICD-10-CM | POA: Diagnosis not present

## 2018-05-14 DIAGNOSIS — L570 Actinic keratosis: Secondary | ICD-10-CM | POA: Diagnosis not present

## 2018-05-17 DIAGNOSIS — F331 Major depressive disorder, recurrent, moderate: Secondary | ICD-10-CM | POA: Insufficient documentation

## 2018-06-05 DIAGNOSIS — J301 Allergic rhinitis due to pollen: Secondary | ICD-10-CM | POA: Diagnosis not present

## 2018-06-05 DIAGNOSIS — J449 Chronic obstructive pulmonary disease, unspecified: Secondary | ICD-10-CM | POA: Diagnosis not present

## 2018-06-05 DIAGNOSIS — K219 Gastro-esophageal reflux disease without esophagitis: Secondary | ICD-10-CM | POA: Diagnosis not present

## 2018-06-05 DIAGNOSIS — M48061 Spinal stenosis, lumbar region without neurogenic claudication: Secondary | ICD-10-CM | POA: Diagnosis not present

## 2018-06-05 DIAGNOSIS — Z23 Encounter for immunization: Secondary | ICD-10-CM | POA: Diagnosis not present

## 2018-06-05 DIAGNOSIS — E782 Mixed hyperlipidemia: Secondary | ICD-10-CM | POA: Diagnosis not present

## 2018-06-05 DIAGNOSIS — E039 Hypothyroidism, unspecified: Secondary | ICD-10-CM | POA: Diagnosis not present

## 2018-06-05 DIAGNOSIS — M25511 Pain in right shoulder: Secondary | ICD-10-CM | POA: Diagnosis not present

## 2018-06-06 DIAGNOSIS — J449 Chronic obstructive pulmonary disease, unspecified: Secondary | ICD-10-CM | POA: Diagnosis not present

## 2018-06-06 DIAGNOSIS — I10 Essential (primary) hypertension: Secondary | ICD-10-CM | POA: Diagnosis not present

## 2018-06-11 DIAGNOSIS — M810 Age-related osteoporosis without current pathological fracture: Secondary | ICD-10-CM | POA: Diagnosis not present

## 2018-06-12 DIAGNOSIS — M75121 Complete rotator cuff tear or rupture of right shoulder, not specified as traumatic: Secondary | ICD-10-CM | POA: Diagnosis not present

## 2018-06-14 DIAGNOSIS — D485 Neoplasm of uncertain behavior of skin: Secondary | ICD-10-CM | POA: Diagnosis not present

## 2018-06-14 DIAGNOSIS — C44311 Basal cell carcinoma of skin of nose: Secondary | ICD-10-CM | POA: Diagnosis not present

## 2018-06-14 DIAGNOSIS — L720 Epidermal cyst: Secondary | ICD-10-CM | POA: Diagnosis not present

## 2018-06-14 DIAGNOSIS — C44321 Squamous cell carcinoma of skin of nose: Secondary | ICD-10-CM | POA: Diagnosis not present

## 2018-07-07 DIAGNOSIS — I10 Essential (primary) hypertension: Secondary | ICD-10-CM | POA: Diagnosis not present

## 2018-07-07 DIAGNOSIS — E039 Hypothyroidism, unspecified: Secondary | ICD-10-CM | POA: Diagnosis not present

## 2018-07-07 DIAGNOSIS — E782 Mixed hyperlipidemia: Secondary | ICD-10-CM | POA: Diagnosis not present

## 2018-07-20 DIAGNOSIS — H524 Presbyopia: Secondary | ICD-10-CM | POA: Diagnosis not present

## 2018-07-20 DIAGNOSIS — H401131 Primary open-angle glaucoma, bilateral, mild stage: Secondary | ICD-10-CM | POA: Diagnosis not present

## 2018-07-20 DIAGNOSIS — Z961 Presence of intraocular lens: Secondary | ICD-10-CM | POA: Diagnosis not present

## 2018-07-20 DIAGNOSIS — H353232 Exudative age-related macular degeneration, bilateral, with inactive choroidal neovascularization: Secondary | ICD-10-CM | POA: Diagnosis not present

## 2018-08-06 DIAGNOSIS — E039 Hypothyroidism, unspecified: Secondary | ICD-10-CM | POA: Diagnosis not present

## 2018-08-06 DIAGNOSIS — R609 Edema, unspecified: Secondary | ICD-10-CM | POA: Diagnosis not present

## 2018-08-06 DIAGNOSIS — J449 Chronic obstructive pulmonary disease, unspecified: Secondary | ICD-10-CM | POA: Diagnosis not present

## 2018-08-16 DIAGNOSIS — M47816 Spondylosis without myelopathy or radiculopathy, lumbar region: Secondary | ICD-10-CM | POA: Diagnosis not present

## 2018-08-16 DIAGNOSIS — S32030S Wedge compression fracture of third lumbar vertebra, sequela: Secondary | ICD-10-CM | POA: Diagnosis not present

## 2018-08-20 DIAGNOSIS — H401131 Primary open-angle glaucoma, bilateral, mild stage: Secondary | ICD-10-CM | POA: Diagnosis not present

## 2018-08-27 DIAGNOSIS — M48061 Spinal stenosis, lumbar region without neurogenic claudication: Secondary | ICD-10-CM | POA: Diagnosis not present

## 2018-08-27 DIAGNOSIS — K219 Gastro-esophageal reflux disease without esophagitis: Secondary | ICD-10-CM | POA: Diagnosis not present

## 2018-08-27 DIAGNOSIS — E039 Hypothyroidism, unspecified: Secondary | ICD-10-CM | POA: Diagnosis not present

## 2018-08-27 DIAGNOSIS — J449 Chronic obstructive pulmonary disease, unspecified: Secondary | ICD-10-CM | POA: Diagnosis not present

## 2018-08-27 DIAGNOSIS — E782 Mixed hyperlipidemia: Secondary | ICD-10-CM | POA: Diagnosis not present

## 2018-09-06 DIAGNOSIS — E782 Mixed hyperlipidemia: Secondary | ICD-10-CM | POA: Diagnosis not present

## 2018-09-06 DIAGNOSIS — E039 Hypothyroidism, unspecified: Secondary | ICD-10-CM | POA: Diagnosis not present

## 2018-11-05 DIAGNOSIS — E039 Hypothyroidism, unspecified: Secondary | ICD-10-CM | POA: Diagnosis not present

## 2018-12-06 DIAGNOSIS — E782 Mixed hyperlipidemia: Secondary | ICD-10-CM | POA: Diagnosis not present

## 2018-12-06 DIAGNOSIS — I10 Essential (primary) hypertension: Secondary | ICD-10-CM | POA: Diagnosis not present

## 2018-12-28 DIAGNOSIS — R600 Localized edema: Secondary | ICD-10-CM | POA: Diagnosis not present

## 2018-12-28 DIAGNOSIS — J449 Chronic obstructive pulmonary disease, unspecified: Secondary | ICD-10-CM | POA: Diagnosis not present

## 2018-12-28 DIAGNOSIS — E782 Mixed hyperlipidemia: Secondary | ICD-10-CM | POA: Diagnosis not present

## 2018-12-28 DIAGNOSIS — E039 Hypothyroidism, unspecified: Secondary | ICD-10-CM | POA: Diagnosis not present

## 2018-12-28 DIAGNOSIS — M79605 Pain in left leg: Secondary | ICD-10-CM | POA: Diagnosis not present

## 2018-12-31 DIAGNOSIS — Z7951 Long term (current) use of inhaled steroids: Secondary | ICD-10-CM | POA: Diagnosis not present

## 2018-12-31 DIAGNOSIS — Z981 Arthrodesis status: Secondary | ICD-10-CM | POA: Diagnosis not present

## 2018-12-31 DIAGNOSIS — E039 Hypothyroidism, unspecified: Secondary | ICD-10-CM | POA: Diagnosis not present

## 2018-12-31 DIAGNOSIS — Z79899 Other long term (current) drug therapy: Secondary | ICD-10-CM | POA: Diagnosis not present

## 2018-12-31 DIAGNOSIS — J441 Chronic obstructive pulmonary disease with (acute) exacerbation: Secondary | ICD-10-CM | POA: Diagnosis not present

## 2018-12-31 DIAGNOSIS — R0602 Shortness of breath: Secondary | ICD-10-CM | POA: Diagnosis not present

## 2018-12-31 DIAGNOSIS — R609 Edema, unspecified: Secondary | ICD-10-CM | POA: Diagnosis not present

## 2018-12-31 DIAGNOSIS — I5032 Chronic diastolic (congestive) heart failure: Secondary | ICD-10-CM | POA: Diagnosis not present

## 2018-12-31 DIAGNOSIS — Z888 Allergy status to other drugs, medicaments and biological substances status: Secondary | ICD-10-CM | POA: Diagnosis not present

## 2018-12-31 DIAGNOSIS — J45901 Unspecified asthma with (acute) exacerbation: Secondary | ICD-10-CM | POA: Diagnosis not present

## 2018-12-31 DIAGNOSIS — K449 Diaphragmatic hernia without obstruction or gangrene: Secondary | ICD-10-CM | POA: Diagnosis not present

## 2018-12-31 DIAGNOSIS — J309 Allergic rhinitis, unspecified: Secondary | ICD-10-CM | POA: Diagnosis not present

## 2018-12-31 DIAGNOSIS — M1990 Unspecified osteoarthritis, unspecified site: Secondary | ICD-10-CM | POA: Diagnosis not present

## 2018-12-31 DIAGNOSIS — Z96653 Presence of artificial knee joint, bilateral: Secondary | ICD-10-CM | POA: Diagnosis not present

## 2018-12-31 DIAGNOSIS — K219 Gastro-esophageal reflux disease without esophagitis: Secondary | ICD-10-CM | POA: Diagnosis not present

## 2018-12-31 DIAGNOSIS — R0902 Hypoxemia: Secondary | ICD-10-CM | POA: Diagnosis not present

## 2018-12-31 DIAGNOSIS — Z7952 Long term (current) use of systemic steroids: Secondary | ICD-10-CM | POA: Diagnosis not present

## 2019-01-01 DIAGNOSIS — J440 Chronic obstructive pulmonary disease with acute lower respiratory infection: Secondary | ICD-10-CM | POA: Diagnosis not present

## 2019-01-03 DIAGNOSIS — J449 Chronic obstructive pulmonary disease, unspecified: Secondary | ICD-10-CM | POA: Diagnosis not present

## 2019-01-05 DIAGNOSIS — I10 Essential (primary) hypertension: Secondary | ICD-10-CM | POA: Diagnosis not present

## 2019-01-05 DIAGNOSIS — E782 Mixed hyperlipidemia: Secondary | ICD-10-CM | POA: Diagnosis not present

## 2019-01-08 DIAGNOSIS — M545 Low back pain: Secondary | ICD-10-CM | POA: Diagnosis not present

## 2019-01-08 DIAGNOSIS — M5416 Radiculopathy, lumbar region: Secondary | ICD-10-CM | POA: Diagnosis not present

## 2019-01-09 ENCOUNTER — Other Ambulatory Visit: Payer: Self-pay | Admitting: Orthopedic Surgery

## 2019-01-09 DIAGNOSIS — M5416 Radiculopathy, lumbar region: Secondary | ICD-10-CM

## 2019-01-09 DIAGNOSIS — S32009K Unspecified fracture of unspecified lumbar vertebra, subsequent encounter for fracture with nonunion: Secondary | ICD-10-CM

## 2019-01-17 DIAGNOSIS — Z131 Encounter for screening for diabetes mellitus: Secondary | ICD-10-CM | POA: Diagnosis not present

## 2019-01-17 DIAGNOSIS — R0602 Shortness of breath: Secondary | ICD-10-CM | POA: Diagnosis not present

## 2019-01-17 DIAGNOSIS — M545 Low back pain: Secondary | ICD-10-CM | POA: Diagnosis not present

## 2019-01-17 DIAGNOSIS — M189 Osteoarthritis of first carpometacarpal joint, unspecified: Secondary | ICD-10-CM | POA: Diagnosis not present

## 2019-01-17 DIAGNOSIS — I10 Essential (primary) hypertension: Secondary | ICD-10-CM | POA: Diagnosis not present

## 2019-01-17 DIAGNOSIS — J4520 Mild intermittent asthma, uncomplicated: Secondary | ICD-10-CM | POA: Diagnosis not present

## 2019-01-21 ENCOUNTER — Ambulatory Visit (HOSPITAL_COMMUNITY)
Admission: RE | Admit: 2019-01-21 | Discharge: 2019-01-21 | Disposition: A | Discharge status: Home / Self Care | Payer: Medicare Other | Source: Ambulatory Visit | Attending: Orthopedic Surgery | Admitting: Orthopedic Surgery

## 2019-01-21 ENCOUNTER — Other Ambulatory Visit: Payer: Self-pay

## 2019-01-21 DIAGNOSIS — M5416 Radiculopathy, lumbar region: Secondary | ICD-10-CM | POA: Diagnosis not present

## 2019-01-21 DIAGNOSIS — M48061 Spinal stenosis, lumbar region without neurogenic claudication: Secondary | ICD-10-CM | POA: Diagnosis not present

## 2019-01-21 DIAGNOSIS — M545 Low back pain: Secondary | ICD-10-CM | POA: Diagnosis not present

## 2019-01-21 DIAGNOSIS — S32009K Unspecified fracture of unspecified lumbar vertebra, subsequent encounter for fracture with nonunion: Secondary | ICD-10-CM | POA: Diagnosis not present

## 2019-01-22 DIAGNOSIS — H5203 Hypermetropia, bilateral: Secondary | ICD-10-CM | POA: Diagnosis not present

## 2019-01-28 DIAGNOSIS — M4807 Spinal stenosis, lumbosacral region: Secondary | ICD-10-CM | POA: Diagnosis not present

## 2019-02-01 DIAGNOSIS — J449 Chronic obstructive pulmonary disease, unspecified: Secondary | ICD-10-CM | POA: Diagnosis not present

## 2019-02-01 DIAGNOSIS — M25551 Pain in right hip: Secondary | ICD-10-CM | POA: Diagnosis not present

## 2019-02-04 ENCOUNTER — Other Ambulatory Visit: Payer: Self-pay | Admitting: Internal Medicine

## 2019-02-04 DIAGNOSIS — G8929 Other chronic pain: Secondary | ICD-10-CM

## 2019-02-05 DIAGNOSIS — E782 Mixed hyperlipidemia: Secondary | ICD-10-CM | POA: Diagnosis not present

## 2019-02-05 DIAGNOSIS — I10 Essential (primary) hypertension: Secondary | ICD-10-CM | POA: Diagnosis not present

## 2019-02-16 DIAGNOSIS — M545 Low back pain: Secondary | ICD-10-CM | POA: Diagnosis not present

## 2019-02-16 DIAGNOSIS — K219 Gastro-esophageal reflux disease without esophagitis: Secondary | ICD-10-CM | POA: Diagnosis not present

## 2019-02-16 DIAGNOSIS — Z780 Asymptomatic menopausal state: Secondary | ICD-10-CM | POA: Diagnosis not present

## 2019-02-16 DIAGNOSIS — E871 Hypo-osmolality and hyponatremia: Secondary | ICD-10-CM | POA: Diagnosis not present

## 2019-02-16 DIAGNOSIS — J441 Chronic obstructive pulmonary disease with (acute) exacerbation: Secondary | ICD-10-CM | POA: Diagnosis not present

## 2019-02-16 DIAGNOSIS — J309 Allergic rhinitis, unspecified: Secondary | ICD-10-CM | POA: Diagnosis not present

## 2019-02-16 DIAGNOSIS — L03116 Cellulitis of left lower limb: Secondary | ICD-10-CM | POA: Diagnosis not present

## 2019-02-16 DIAGNOSIS — Z7951 Long term (current) use of inhaled steroids: Secondary | ICD-10-CM | POA: Diagnosis not present

## 2019-02-16 DIAGNOSIS — E039 Hypothyroidism, unspecified: Secondary | ICD-10-CM | POA: Diagnosis not present

## 2019-02-16 DIAGNOSIS — M7989 Other specified soft tissue disorders: Secondary | ICD-10-CM | POA: Diagnosis not present

## 2019-02-16 DIAGNOSIS — R05 Cough: Secondary | ICD-10-CM | POA: Diagnosis not present

## 2019-02-16 DIAGNOSIS — I10 Essential (primary) hypertension: Secondary | ICD-10-CM | POA: Diagnosis not present

## 2019-02-16 DIAGNOSIS — E876 Hypokalemia: Secondary | ICD-10-CM | POA: Diagnosis not present

## 2019-02-16 DIAGNOSIS — Z7952 Long term (current) use of systemic steroids: Secondary | ICD-10-CM | POA: Diagnosis not present

## 2019-02-16 DIAGNOSIS — G8929 Other chronic pain: Secondary | ICD-10-CM | POA: Diagnosis not present

## 2019-02-17 DIAGNOSIS — R05 Cough: Secondary | ICD-10-CM | POA: Diagnosis not present

## 2019-02-20 ENCOUNTER — Inpatient Hospital Stay: Admission: RE | Admit: 2019-02-20 | Payer: Medicare Other | Source: Ambulatory Visit

## 2019-02-28 DIAGNOSIS — Z9181 History of falling: Secondary | ICD-10-CM | POA: Diagnosis not present

## 2019-02-28 DIAGNOSIS — Z79891 Long term (current) use of opiate analgesic: Secondary | ICD-10-CM | POA: Diagnosis not present

## 2019-02-28 DIAGNOSIS — M545 Low back pain: Secondary | ICD-10-CM | POA: Diagnosis not present

## 2019-02-28 DIAGNOSIS — Z7952 Long term (current) use of systemic steroids: Secondary | ICD-10-CM | POA: Diagnosis not present

## 2019-02-28 DIAGNOSIS — Z4800 Encounter for change or removal of nonsurgical wound dressing: Secondary | ICD-10-CM | POA: Diagnosis not present

## 2019-02-28 DIAGNOSIS — I10 Essential (primary) hypertension: Secondary | ICD-10-CM | POA: Diagnosis not present

## 2019-02-28 DIAGNOSIS — J4520 Mild intermittent asthma, uncomplicated: Secondary | ICD-10-CM | POA: Diagnosis not present

## 2019-02-28 DIAGNOSIS — L97829 Non-pressure chronic ulcer of other part of left lower leg with unspecified severity: Secondary | ICD-10-CM | POA: Diagnosis not present

## 2019-02-28 DIAGNOSIS — M189 Osteoarthritis of first carpometacarpal joint, unspecified: Secondary | ICD-10-CM | POA: Diagnosis not present

## 2019-03-04 DIAGNOSIS — L0390 Cellulitis, unspecified: Secondary | ICD-10-CM | POA: Diagnosis not present

## 2019-03-04 DIAGNOSIS — J4520 Mild intermittent asthma, uncomplicated: Secondary | ICD-10-CM | POA: Diagnosis not present

## 2019-03-04 DIAGNOSIS — Z79891 Long term (current) use of opiate analgesic: Secondary | ICD-10-CM | POA: Diagnosis not present

## 2019-03-04 DIAGNOSIS — I10 Essential (primary) hypertension: Secondary | ICD-10-CM | POA: Diagnosis not present

## 2019-03-04 DIAGNOSIS — M545 Low back pain: Secondary | ICD-10-CM | POA: Diagnosis not present

## 2019-03-04 DIAGNOSIS — Z9181 History of falling: Secondary | ICD-10-CM | POA: Diagnosis not present

## 2019-03-04 DIAGNOSIS — M189 Osteoarthritis of first carpometacarpal joint, unspecified: Secondary | ICD-10-CM | POA: Diagnosis not present

## 2019-03-04 DIAGNOSIS — L97829 Non-pressure chronic ulcer of other part of left lower leg with unspecified severity: Secondary | ICD-10-CM | POA: Diagnosis not present

## 2019-03-04 DIAGNOSIS — Z0000 Encounter for general adult medical examination without abnormal findings: Secondary | ICD-10-CM | POA: Diagnosis not present

## 2019-03-04 DIAGNOSIS — Z4800 Encounter for change or removal of nonsurgical wound dressing: Secondary | ICD-10-CM | POA: Diagnosis not present

## 2019-03-04 DIAGNOSIS — Z7952 Long term (current) use of systemic steroids: Secondary | ICD-10-CM | POA: Diagnosis not present

## 2019-03-05 ENCOUNTER — Ambulatory Visit
Admission: RE | Admit: 2019-03-05 | Discharge: 2019-03-05 | Disposition: A | Discharge status: Home / Self Care | Payer: Medicare Other | Source: Ambulatory Visit | Attending: Internal Medicine | Admitting: Internal Medicine

## 2019-03-05 DIAGNOSIS — M545 Low back pain, unspecified: Secondary | ICD-10-CM

## 2019-03-05 DIAGNOSIS — G8929 Other chronic pain: Secondary | ICD-10-CM

## 2019-03-05 DIAGNOSIS — M48061 Spinal stenosis, lumbar region without neurogenic claudication: Secondary | ICD-10-CM | POA: Diagnosis not present

## 2019-03-05 MED ORDER — IOPAMIDOL (ISOVUE-M 200) INJECTION 41%
1.0000 mL | Freq: Once | INTRAMUSCULAR | Status: AC
  Administered 2019-03-05: 15:00:00 1 mL via EPIDURAL

## 2019-03-05 MED ORDER — METHYLPREDNISOLONE ACETATE 40 MG/ML INJ SUSP (RADIOLOG
120.0000 mg | Freq: Once | INTRAMUSCULAR | Status: AC
  Administered 2019-03-05: 120 mg via EPIDURAL

## 2019-03-05 NOTE — Discharge Instructions (Signed)

## 2019-03-06 DIAGNOSIS — L97829 Non-pressure chronic ulcer of other part of left lower leg with unspecified severity: Secondary | ICD-10-CM | POA: Diagnosis not present

## 2019-03-13 DIAGNOSIS — J4520 Mild intermittent asthma, uncomplicated: Secondary | ICD-10-CM | POA: Diagnosis not present

## 2019-03-13 DIAGNOSIS — M189 Osteoarthritis of first carpometacarpal joint, unspecified: Secondary | ICD-10-CM | POA: Diagnosis not present

## 2019-03-13 DIAGNOSIS — Z79891 Long term (current) use of opiate analgesic: Secondary | ICD-10-CM | POA: Diagnosis not present

## 2019-03-13 DIAGNOSIS — L97829 Non-pressure chronic ulcer of other part of left lower leg with unspecified severity: Secondary | ICD-10-CM | POA: Diagnosis not present

## 2019-03-13 DIAGNOSIS — Z9181 History of falling: Secondary | ICD-10-CM | POA: Diagnosis not present

## 2019-03-13 DIAGNOSIS — Z7952 Long term (current) use of systemic steroids: Secondary | ICD-10-CM | POA: Diagnosis not present

## 2019-03-13 DIAGNOSIS — Z4800 Encounter for change or removal of nonsurgical wound dressing: Secondary | ICD-10-CM | POA: Diagnosis not present

## 2019-03-13 DIAGNOSIS — I10 Essential (primary) hypertension: Secondary | ICD-10-CM | POA: Diagnosis not present

## 2019-03-13 DIAGNOSIS — M545 Low back pain: Secondary | ICD-10-CM | POA: Diagnosis not present

## 2019-03-15 DIAGNOSIS — L97829 Non-pressure chronic ulcer of other part of left lower leg with unspecified severity: Secondary | ICD-10-CM | POA: Diagnosis not present

## 2019-03-20 DIAGNOSIS — M545 Low back pain: Secondary | ICD-10-CM | POA: Diagnosis not present

## 2019-03-20 DIAGNOSIS — J4520 Mild intermittent asthma, uncomplicated: Secondary | ICD-10-CM | POA: Diagnosis not present

## 2019-03-20 DIAGNOSIS — M189 Osteoarthritis of first carpometacarpal joint, unspecified: Secondary | ICD-10-CM | POA: Diagnosis not present

## 2019-03-20 DIAGNOSIS — Z4800 Encounter for change or removal of nonsurgical wound dressing: Secondary | ICD-10-CM | POA: Diagnosis not present

## 2019-03-20 DIAGNOSIS — I10 Essential (primary) hypertension: Secondary | ICD-10-CM | POA: Diagnosis not present

## 2019-03-20 DIAGNOSIS — Z7952 Long term (current) use of systemic steroids: Secondary | ICD-10-CM | POA: Diagnosis not present

## 2019-03-20 DIAGNOSIS — Z79891 Long term (current) use of opiate analgesic: Secondary | ICD-10-CM | POA: Diagnosis not present

## 2019-03-20 DIAGNOSIS — Z9181 History of falling: Secondary | ICD-10-CM | POA: Diagnosis not present

## 2019-03-20 DIAGNOSIS — L97829 Non-pressure chronic ulcer of other part of left lower leg with unspecified severity: Secondary | ICD-10-CM | POA: Diagnosis not present

## 2019-03-27 DIAGNOSIS — Z4800 Encounter for change or removal of nonsurgical wound dressing: Secondary | ICD-10-CM | POA: Diagnosis not present

## 2019-03-27 DIAGNOSIS — Z7952 Long term (current) use of systemic steroids: Secondary | ICD-10-CM | POA: Diagnosis not present

## 2019-03-27 DIAGNOSIS — M189 Osteoarthritis of first carpometacarpal joint, unspecified: Secondary | ICD-10-CM | POA: Diagnosis not present

## 2019-03-27 DIAGNOSIS — Z9181 History of falling: Secondary | ICD-10-CM | POA: Diagnosis not present

## 2019-03-27 DIAGNOSIS — M545 Low back pain: Secondary | ICD-10-CM | POA: Diagnosis not present

## 2019-03-27 DIAGNOSIS — J4520 Mild intermittent asthma, uncomplicated: Secondary | ICD-10-CM | POA: Diagnosis not present

## 2019-03-27 DIAGNOSIS — I10 Essential (primary) hypertension: Secondary | ICD-10-CM | POA: Diagnosis not present

## 2019-03-27 DIAGNOSIS — Z79891 Long term (current) use of opiate analgesic: Secondary | ICD-10-CM | POA: Diagnosis not present

## 2019-03-27 DIAGNOSIS — L97829 Non-pressure chronic ulcer of other part of left lower leg with unspecified severity: Secondary | ICD-10-CM | POA: Diagnosis not present

## 2019-04-03 DIAGNOSIS — Z780 Asymptomatic menopausal state: Secondary | ICD-10-CM | POA: Diagnosis not present

## 2019-04-03 DIAGNOSIS — G8929 Other chronic pain: Secondary | ICD-10-CM | POA: Diagnosis not present

## 2019-04-03 DIAGNOSIS — J441 Chronic obstructive pulmonary disease with (acute) exacerbation: Secondary | ICD-10-CM | POA: Diagnosis not present

## 2019-04-03 DIAGNOSIS — R0602 Shortness of breath: Secondary | ICD-10-CM | POA: Diagnosis not present

## 2019-04-03 DIAGNOSIS — E039 Hypothyroidism, unspecified: Secondary | ICD-10-CM | POA: Diagnosis not present

## 2019-04-03 DIAGNOSIS — I10 Essential (primary) hypertension: Secondary | ICD-10-CM | POA: Diagnosis not present

## 2019-04-03 DIAGNOSIS — K219 Gastro-esophageal reflux disease without esophagitis: Secondary | ICD-10-CM | POA: Diagnosis not present

## 2019-04-03 DIAGNOSIS — Z7952 Long term (current) use of systemic steroids: Secondary | ICD-10-CM | POA: Diagnosis not present

## 2019-04-03 DIAGNOSIS — M545 Low back pain: Secondary | ICD-10-CM | POA: Diagnosis not present

## 2019-04-03 DIAGNOSIS — J309 Allergic rhinitis, unspecified: Secondary | ICD-10-CM | POA: Diagnosis not present

## 2019-04-03 DIAGNOSIS — J9602 Acute respiratory failure with hypercapnia: Secondary | ICD-10-CM | POA: Diagnosis not present

## 2019-04-03 DIAGNOSIS — E874 Mixed disorder of acid-base balance: Secondary | ICD-10-CM | POA: Diagnosis not present

## 2019-05-01 DIAGNOSIS — I878 Other specified disorders of veins: Secondary | ICD-10-CM | POA: Diagnosis not present

## 2019-05-01 DIAGNOSIS — J449 Chronic obstructive pulmonary disease, unspecified: Secondary | ICD-10-CM | POA: Diagnosis not present

## 2019-05-01 DIAGNOSIS — M545 Low back pain: Secondary | ICD-10-CM | POA: Diagnosis not present

## 2019-05-06 DIAGNOSIS — L570 Actinic keratosis: Secondary | ICD-10-CM | POA: Diagnosis not present

## 2019-05-06 DIAGNOSIS — Z85828 Personal history of other malignant neoplasm of skin: Secondary | ICD-10-CM | POA: Diagnosis not present

## 2019-05-06 DIAGNOSIS — L853 Xerosis cutis: Secondary | ICD-10-CM | POA: Diagnosis not present

## 2019-05-28 DIAGNOSIS — R531 Weakness: Secondary | ICD-10-CM | POA: Diagnosis not present

## 2019-05-28 DIAGNOSIS — E873 Alkalosis: Secondary | ICD-10-CM | POA: Diagnosis not present

## 2019-05-28 DIAGNOSIS — M545 Low back pain: Secondary | ICD-10-CM | POA: Diagnosis not present

## 2019-05-28 DIAGNOSIS — M6281 Muscle weakness (generalized): Secondary | ICD-10-CM | POA: Diagnosis not present

## 2019-05-28 DIAGNOSIS — J441 Chronic obstructive pulmonary disease with (acute) exacerbation: Secondary | ICD-10-CM | POA: Diagnosis not present

## 2019-05-28 DIAGNOSIS — S0990XA Unspecified injury of head, initial encounter: Secondary | ICD-10-CM | POA: Diagnosis not present

## 2019-05-28 DIAGNOSIS — M25552 Pain in left hip: Secondary | ICD-10-CM | POA: Diagnosis not present

## 2019-05-28 DIAGNOSIS — E874 Mixed disorder of acid-base balance: Secondary | ICD-10-CM | POA: Diagnosis not present

## 2019-05-28 DIAGNOSIS — M25551 Pain in right hip: Secondary | ICD-10-CM | POA: Diagnosis not present

## 2019-05-28 DIAGNOSIS — K219 Gastro-esophageal reflux disease without esophagitis: Secondary | ICD-10-CM | POA: Diagnosis not present

## 2019-05-28 DIAGNOSIS — I10 Essential (primary) hypertension: Secondary | ICD-10-CM | POA: Diagnosis not present

## 2019-05-28 DIAGNOSIS — E039 Hypothyroidism, unspecified: Secondary | ICD-10-CM | POA: Diagnosis not present

## 2019-05-28 DIAGNOSIS — Z7952 Long term (current) use of systemic steroids: Secondary | ICD-10-CM | POA: Diagnosis not present

## 2019-05-28 DIAGNOSIS — J309 Allergic rhinitis, unspecified: Secondary | ICD-10-CM | POA: Diagnosis not present

## 2019-05-28 DIAGNOSIS — Z780 Asymptomatic menopausal state: Secondary | ICD-10-CM | POA: Diagnosis not present

## 2019-05-28 DIAGNOSIS — S79911A Unspecified injury of right hip, initial encounter: Secondary | ICD-10-CM | POA: Diagnosis not present

## 2019-05-28 DIAGNOSIS — R2689 Other abnormalities of gait and mobility: Secondary | ICD-10-CM | POA: Diagnosis not present

## 2019-05-28 DIAGNOSIS — R05 Cough: Secondary | ICD-10-CM | POA: Diagnosis not present

## 2019-05-28 DIAGNOSIS — M25462 Effusion, left knee: Secondary | ICD-10-CM | POA: Diagnosis not present

## 2019-05-28 DIAGNOSIS — J90 Pleural effusion, not elsewhere classified: Secondary | ICD-10-CM | POA: Diagnosis not present

## 2019-05-28 DIAGNOSIS — G8929 Other chronic pain: Secondary | ICD-10-CM | POA: Diagnosis not present

## 2019-05-28 DIAGNOSIS — S6992XA Unspecified injury of left wrist, hand and finger(s), initial encounter: Secondary | ICD-10-CM | POA: Diagnosis not present

## 2019-05-28 DIAGNOSIS — M25532 Pain in left wrist: Secondary | ICD-10-CM | POA: Diagnosis not present

## 2019-05-28 DIAGNOSIS — R0602 Shortness of breath: Secondary | ICD-10-CM | POA: Diagnosis not present

## 2019-05-28 DIAGNOSIS — J9602 Acute respiratory failure with hypercapnia: Secondary | ICD-10-CM | POA: Diagnosis not present

## 2019-06-05 DIAGNOSIS — E873 Alkalosis: Secondary | ICD-10-CM | POA: Diagnosis not present

## 2019-06-05 DIAGNOSIS — E874 Mixed disorder of acid-base balance: Secondary | ICD-10-CM | POA: Diagnosis not present

## 2019-06-05 DIAGNOSIS — E038 Other specified hypothyroidism: Secondary | ICD-10-CM | POA: Diagnosis not present

## 2019-06-05 DIAGNOSIS — J449 Chronic obstructive pulmonary disease, unspecified: Secondary | ICD-10-CM | POA: Diagnosis not present

## 2019-06-05 DIAGNOSIS — R2689 Other abnormalities of gait and mobility: Secondary | ICD-10-CM | POA: Diagnosis not present

## 2019-06-05 DIAGNOSIS — J9690 Respiratory failure, unspecified, unspecified whether with hypoxia or hypercapnia: Secondary | ICD-10-CM | POA: Diagnosis not present

## 2019-06-05 DIAGNOSIS — K219 Gastro-esophageal reflux disease without esophagitis: Secondary | ICD-10-CM | POA: Diagnosis not present

## 2019-06-05 DIAGNOSIS — J441 Chronic obstructive pulmonary disease with (acute) exacerbation: Secondary | ICD-10-CM | POA: Diagnosis not present

## 2019-06-05 DIAGNOSIS — M545 Low back pain: Secondary | ICD-10-CM | POA: Diagnosis not present

## 2019-06-05 DIAGNOSIS — I10 Essential (primary) hypertension: Secondary | ICD-10-CM | POA: Diagnosis not present

## 2019-06-05 DIAGNOSIS — M6281 Muscle weakness (generalized): Secondary | ICD-10-CM | POA: Diagnosis not present

## 2019-06-05 DIAGNOSIS — R531 Weakness: Secondary | ICD-10-CM | POA: Diagnosis not present

## 2019-06-05 DIAGNOSIS — J9602 Acute respiratory failure with hypercapnia: Secondary | ICD-10-CM | POA: Diagnosis not present

## 2019-06-06 DIAGNOSIS — K219 Gastro-esophageal reflux disease without esophagitis: Secondary | ICD-10-CM | POA: Diagnosis not present

## 2019-06-06 DIAGNOSIS — R531 Weakness: Secondary | ICD-10-CM | POA: Diagnosis not present

## 2019-06-06 DIAGNOSIS — E038 Other specified hypothyroidism: Secondary | ICD-10-CM | POA: Diagnosis not present

## 2019-06-06 DIAGNOSIS — J9690 Respiratory failure, unspecified, unspecified whether with hypoxia or hypercapnia: Secondary | ICD-10-CM | POA: Diagnosis not present

## 2019-06-17 DIAGNOSIS — E039 Hypothyroidism, unspecified: Secondary | ICD-10-CM | POA: Diagnosis not present

## 2019-06-17 DIAGNOSIS — G8929 Other chronic pain: Secondary | ICD-10-CM | POA: Diagnosis not present

## 2019-06-17 DIAGNOSIS — K219 Gastro-esophageal reflux disease without esophagitis: Secondary | ICD-10-CM | POA: Diagnosis not present

## 2019-06-17 DIAGNOSIS — Z7952 Long term (current) use of systemic steroids: Secondary | ICD-10-CM | POA: Diagnosis not present

## 2019-06-17 DIAGNOSIS — M545 Low back pain: Secondary | ICD-10-CM | POA: Diagnosis not present

## 2019-06-17 DIAGNOSIS — I10 Essential (primary) hypertension: Secondary | ICD-10-CM | POA: Diagnosis not present

## 2019-06-17 DIAGNOSIS — J441 Chronic obstructive pulmonary disease with (acute) exacerbation: Secondary | ICD-10-CM | POA: Diagnosis not present

## 2019-06-17 DIAGNOSIS — Z9981 Dependence on supplemental oxygen: Secondary | ICD-10-CM | POA: Diagnosis not present

## 2019-06-19 DIAGNOSIS — E039 Hypothyroidism, unspecified: Secondary | ICD-10-CM | POA: Diagnosis not present

## 2019-06-19 DIAGNOSIS — Z9981 Dependence on supplemental oxygen: Secondary | ICD-10-CM | POA: Diagnosis not present

## 2019-06-19 DIAGNOSIS — M545 Low back pain: Secondary | ICD-10-CM | POA: Diagnosis not present

## 2019-06-19 DIAGNOSIS — I10 Essential (primary) hypertension: Secondary | ICD-10-CM | POA: Diagnosis not present

## 2019-06-19 DIAGNOSIS — J441 Chronic obstructive pulmonary disease with (acute) exacerbation: Secondary | ICD-10-CM | POA: Diagnosis not present

## 2019-06-19 DIAGNOSIS — G8929 Other chronic pain: Secondary | ICD-10-CM | POA: Diagnosis not present

## 2019-06-19 DIAGNOSIS — K219 Gastro-esophageal reflux disease without esophagitis: Secondary | ICD-10-CM | POA: Diagnosis not present

## 2019-06-19 DIAGNOSIS — Z7952 Long term (current) use of systemic steroids: Secondary | ICD-10-CM | POA: Diagnosis not present

## 2019-06-21 DIAGNOSIS — M545 Low back pain: Secondary | ICD-10-CM | POA: Diagnosis not present

## 2019-06-21 DIAGNOSIS — Z9981 Dependence on supplemental oxygen: Secondary | ICD-10-CM | POA: Diagnosis not present

## 2019-06-21 DIAGNOSIS — G8929 Other chronic pain: Secondary | ICD-10-CM | POA: Diagnosis not present

## 2019-06-21 DIAGNOSIS — I10 Essential (primary) hypertension: Secondary | ICD-10-CM | POA: Diagnosis not present

## 2019-06-21 DIAGNOSIS — Z7952 Long term (current) use of systemic steroids: Secondary | ICD-10-CM | POA: Diagnosis not present

## 2019-06-21 DIAGNOSIS — K219 Gastro-esophageal reflux disease without esophagitis: Secondary | ICD-10-CM | POA: Diagnosis not present

## 2019-06-21 DIAGNOSIS — J441 Chronic obstructive pulmonary disease with (acute) exacerbation: Secondary | ICD-10-CM | POA: Diagnosis not present

## 2019-06-21 DIAGNOSIS — E039 Hypothyroidism, unspecified: Secondary | ICD-10-CM | POA: Diagnosis not present

## 2019-06-24 DIAGNOSIS — J441 Chronic obstructive pulmonary disease with (acute) exacerbation: Secondary | ICD-10-CM | POA: Diagnosis not present

## 2019-06-24 DIAGNOSIS — Z7952 Long term (current) use of systemic steroids: Secondary | ICD-10-CM | POA: Diagnosis not present

## 2019-06-24 DIAGNOSIS — Z0000 Encounter for general adult medical examination without abnormal findings: Secondary | ICD-10-CM | POA: Diagnosis not present

## 2019-06-24 DIAGNOSIS — M545 Low back pain: Secondary | ICD-10-CM | POA: Diagnosis not present

## 2019-06-24 DIAGNOSIS — E039 Hypothyroidism, unspecified: Secondary | ICD-10-CM | POA: Diagnosis not present

## 2019-06-24 DIAGNOSIS — Z1389 Encounter for screening for other disorder: Secondary | ICD-10-CM | POA: Diagnosis not present

## 2019-06-24 DIAGNOSIS — G8929 Other chronic pain: Secondary | ICD-10-CM | POA: Diagnosis not present

## 2019-06-24 DIAGNOSIS — I10 Essential (primary) hypertension: Secondary | ICD-10-CM | POA: Diagnosis not present

## 2019-06-24 DIAGNOSIS — Z6822 Body mass index (BMI) 22.0-22.9, adult: Secondary | ICD-10-CM | POA: Diagnosis not present

## 2019-06-24 DIAGNOSIS — K219 Gastro-esophageal reflux disease without esophagitis: Secondary | ICD-10-CM | POA: Diagnosis not present

## 2019-06-24 DIAGNOSIS — J449 Chronic obstructive pulmonary disease, unspecified: Secondary | ICD-10-CM | POA: Diagnosis not present

## 2019-06-24 DIAGNOSIS — Z9981 Dependence on supplemental oxygen: Secondary | ICD-10-CM | POA: Diagnosis not present

## 2019-06-25 DIAGNOSIS — Z9981 Dependence on supplemental oxygen: Secondary | ICD-10-CM | POA: Diagnosis not present

## 2019-06-25 DIAGNOSIS — Z7952 Long term (current) use of systemic steroids: Secondary | ICD-10-CM | POA: Diagnosis not present

## 2019-06-25 DIAGNOSIS — E039 Hypothyroidism, unspecified: Secondary | ICD-10-CM | POA: Diagnosis not present

## 2019-06-25 DIAGNOSIS — K219 Gastro-esophageal reflux disease without esophagitis: Secondary | ICD-10-CM | POA: Diagnosis not present

## 2019-06-25 DIAGNOSIS — G8929 Other chronic pain: Secondary | ICD-10-CM | POA: Diagnosis not present

## 2019-06-25 DIAGNOSIS — I10 Essential (primary) hypertension: Secondary | ICD-10-CM | POA: Diagnosis not present

## 2019-06-25 DIAGNOSIS — M545 Low back pain: Secondary | ICD-10-CM | POA: Diagnosis not present

## 2019-06-25 DIAGNOSIS — J441 Chronic obstructive pulmonary disease with (acute) exacerbation: Secondary | ICD-10-CM | POA: Diagnosis not present

## 2019-06-28 DIAGNOSIS — Z9981 Dependence on supplemental oxygen: Secondary | ICD-10-CM | POA: Diagnosis not present

## 2019-06-28 DIAGNOSIS — K219 Gastro-esophageal reflux disease without esophagitis: Secondary | ICD-10-CM | POA: Diagnosis not present

## 2019-06-28 DIAGNOSIS — M545 Low back pain: Secondary | ICD-10-CM | POA: Diagnosis not present

## 2019-06-28 DIAGNOSIS — G8929 Other chronic pain: Secondary | ICD-10-CM | POA: Diagnosis not present

## 2019-06-28 DIAGNOSIS — Z7952 Long term (current) use of systemic steroids: Secondary | ICD-10-CM | POA: Diagnosis not present

## 2019-06-28 DIAGNOSIS — E039 Hypothyroidism, unspecified: Secondary | ICD-10-CM | POA: Diagnosis not present

## 2019-06-28 DIAGNOSIS — J441 Chronic obstructive pulmonary disease with (acute) exacerbation: Secondary | ICD-10-CM | POA: Diagnosis not present

## 2019-06-28 DIAGNOSIS — I10 Essential (primary) hypertension: Secondary | ICD-10-CM | POA: Diagnosis not present

## 2019-07-01 DIAGNOSIS — I10 Essential (primary) hypertension: Secondary | ICD-10-CM | POA: Diagnosis not present

## 2019-07-01 DIAGNOSIS — G8929 Other chronic pain: Secondary | ICD-10-CM | POA: Diagnosis not present

## 2019-07-01 DIAGNOSIS — M545 Low back pain: Secondary | ICD-10-CM | POA: Diagnosis not present

## 2019-07-01 DIAGNOSIS — K219 Gastro-esophageal reflux disease without esophagitis: Secondary | ICD-10-CM | POA: Diagnosis not present

## 2019-07-01 DIAGNOSIS — J441 Chronic obstructive pulmonary disease with (acute) exacerbation: Secondary | ICD-10-CM | POA: Diagnosis not present

## 2019-07-01 DIAGNOSIS — E039 Hypothyroidism, unspecified: Secondary | ICD-10-CM | POA: Diagnosis not present

## 2019-07-01 DIAGNOSIS — Z7952 Long term (current) use of systemic steroids: Secondary | ICD-10-CM | POA: Diagnosis not present

## 2019-07-01 DIAGNOSIS — Z9981 Dependence on supplemental oxygen: Secondary | ICD-10-CM | POA: Diagnosis not present

## 2019-07-15 DIAGNOSIS — J449 Chronic obstructive pulmonary disease, unspecified: Secondary | ICD-10-CM | POA: Diagnosis not present

## 2019-07-16 DIAGNOSIS — Z9981 Dependence on supplemental oxygen: Secondary | ICD-10-CM | POA: Diagnosis not present

## 2019-07-16 DIAGNOSIS — E039 Hypothyroidism, unspecified: Secondary | ICD-10-CM | POA: Diagnosis not present

## 2019-07-16 DIAGNOSIS — M545 Low back pain: Secondary | ICD-10-CM | POA: Diagnosis not present

## 2019-07-16 DIAGNOSIS — K219 Gastro-esophageal reflux disease without esophagitis: Secondary | ICD-10-CM | POA: Diagnosis not present

## 2019-07-16 DIAGNOSIS — G8929 Other chronic pain: Secondary | ICD-10-CM | POA: Diagnosis not present

## 2019-07-16 DIAGNOSIS — Z7952 Long term (current) use of systemic steroids: Secondary | ICD-10-CM | POA: Diagnosis not present

## 2019-07-16 DIAGNOSIS — I10 Essential (primary) hypertension: Secondary | ICD-10-CM | POA: Diagnosis not present

## 2019-07-16 DIAGNOSIS — J441 Chronic obstructive pulmonary disease with (acute) exacerbation: Secondary | ICD-10-CM | POA: Diagnosis not present

## 2019-07-31 DIAGNOSIS — Z6822 Body mass index (BMI) 22.0-22.9, adult: Secondary | ICD-10-CM | POA: Diagnosis not present

## 2019-07-31 DIAGNOSIS — J449 Chronic obstructive pulmonary disease, unspecified: Secondary | ICD-10-CM | POA: Diagnosis not present

## 2019-07-31 DIAGNOSIS — I509 Heart failure, unspecified: Secondary | ICD-10-CM | POA: Diagnosis not present

## 2019-08-15 DIAGNOSIS — J449 Chronic obstructive pulmonary disease, unspecified: Secondary | ICD-10-CM | POA: Diagnosis not present

## 2019-09-06 DIAGNOSIS — I10 Essential (primary) hypertension: Secondary | ICD-10-CM | POA: Diagnosis not present

## 2019-09-06 DIAGNOSIS — E7849 Other hyperlipidemia: Secondary | ICD-10-CM | POA: Diagnosis not present

## 2019-09-11 DIAGNOSIS — Z6823 Body mass index (BMI) 23.0-23.9, adult: Secondary | ICD-10-CM | POA: Diagnosis not present

## 2019-09-11 DIAGNOSIS — M25842 Other specified joint disorders, left hand: Secondary | ICD-10-CM | POA: Diagnosis not present

## 2019-09-11 DIAGNOSIS — C44629 Squamous cell carcinoma of skin of left upper limb, including shoulder: Secondary | ICD-10-CM | POA: Diagnosis not present

## 2019-09-11 DIAGNOSIS — C4491 Basal cell carcinoma of skin, unspecified: Secondary | ICD-10-CM | POA: Diagnosis not present

## 2019-09-15 DIAGNOSIS — J449 Chronic obstructive pulmonary disease, unspecified: Secondary | ICD-10-CM | POA: Diagnosis not present

## 2019-09-17 DIAGNOSIS — L905 Scar conditions and fibrosis of skin: Secondary | ICD-10-CM | POA: Diagnosis not present

## 2019-09-17 DIAGNOSIS — C44629 Squamous cell carcinoma of skin of left upper limb, including shoulder: Secondary | ICD-10-CM | POA: Diagnosis not present

## 2019-09-18 ENCOUNTER — Other Ambulatory Visit: Payer: Self-pay

## 2019-09-18 NOTE — Patient Outreach (Signed)
[facility]) Care Management  09/18/2019  Lindsey Butler February 01, 1929 [id number]   Medication Adherence call to Lindsey Butler Hipp[facility] Identifiers Verify spoke with patient she is past due on Losartan 50 mg,patient explain she is now receiving a pill pack every month on the 15 th of the month,patient explain she has a few and will be receiving it thru her pharmacy. Lindsey Butler is showing due under [place].   [place] Direct Dial [phone]  Fax [phone] [email or web address]

## 2019-10-03 ENCOUNTER — Emergency Department (HOSPITAL_COMMUNITY): Payer: Medicare Other

## 2019-10-03 ENCOUNTER — Other Ambulatory Visit: Payer: Self-pay

## 2019-10-03 ENCOUNTER — Encounter (HOSPITAL_COMMUNITY): Payer: Self-pay | Admitting: *Deleted

## 2019-10-03 ENCOUNTER — Inpatient Hospital Stay (HOSPITAL_COMMUNITY)
Admission: EM | Admit: 2019-10-03 | Discharge: 2019-10-08 | DRG: 522 | Disposition: A | Discharge status: Skilled Nursing Facility | Payer: Medicare Other | Attending: Internal Medicine | Admitting: Internal Medicine

## 2019-10-03 DIAGNOSIS — E785 Hyperlipidemia, unspecified: Secondary | ICD-10-CM | POA: Diagnosis present

## 2019-10-03 DIAGNOSIS — E039 Hypothyroidism, unspecified: Secondary | ICD-10-CM | POA: Diagnosis not present

## 2019-10-03 DIAGNOSIS — Z20822 Contact with and (suspected) exposure to covid-19: Secondary | ICD-10-CM | POA: Diagnosis present

## 2019-10-03 DIAGNOSIS — J45909 Unspecified asthma, uncomplicated: Secondary | ICD-10-CM | POA: Diagnosis present

## 2019-10-03 DIAGNOSIS — S299XXA Unspecified injury of thorax, initial encounter: Secondary | ICD-10-CM | POA: Diagnosis not present

## 2019-10-03 DIAGNOSIS — S31821A Laceration without foreign body of left buttock, initial encounter: Secondary | ICD-10-CM | POA: Diagnosis present

## 2019-10-03 DIAGNOSIS — M1990 Unspecified osteoarthritis, unspecified site: Secondary | ICD-10-CM | POA: Diagnosis present

## 2019-10-03 DIAGNOSIS — Z79890 Hormone replacement therapy (postmenopausal): Secondary | ICD-10-CM

## 2019-10-03 DIAGNOSIS — S72012A Unspecified intracapsular fracture of left femur, initial encounter for closed fracture: Principal | ICD-10-CM | POA: Diagnosis present

## 2019-10-03 DIAGNOSIS — K219 Gastro-esophageal reflux disease without esophagitis: Secondary | ICD-10-CM | POA: Diagnosis not present

## 2019-10-03 DIAGNOSIS — I10 Essential (primary) hypertension: Secondary | ICD-10-CM | POA: Diagnosis not present

## 2019-10-03 DIAGNOSIS — Y92008 Other place in unspecified non-institutional (private) residence as the place of occurrence of the external cause: Secondary | ICD-10-CM

## 2019-10-03 DIAGNOSIS — W19XXXA Unspecified fall, initial encounter: Secondary | ICD-10-CM

## 2019-10-03 DIAGNOSIS — D649 Anemia, unspecified: Secondary | ICD-10-CM | POA: Diagnosis not present

## 2019-10-03 DIAGNOSIS — Z79899 Other long term (current) drug therapy: Secondary | ICD-10-CM

## 2019-10-03 DIAGNOSIS — Z743 Need for continuous supervision: Secondary | ICD-10-CM | POA: Diagnosis not present

## 2019-10-03 DIAGNOSIS — S72002A Fracture of unspecified part of neck of left femur, initial encounter for closed fracture: Secondary | ICD-10-CM | POA: Diagnosis not present

## 2019-10-03 DIAGNOSIS — W1839XA Other fall on same level, initial encounter: Secondary | ICD-10-CM | POA: Diagnosis present

## 2019-10-03 DIAGNOSIS — R9431 Abnormal electrocardiogram [ECG] [EKG]: Secondary | ICD-10-CM | POA: Diagnosis not present

## 2019-10-03 DIAGNOSIS — Z03818 Encounter for observation for suspected exposure to other biological agents ruled out: Secondary | ICD-10-CM | POA: Diagnosis not present

## 2019-10-03 DIAGNOSIS — Z96642 Presence of left artificial hip joint: Secondary | ICD-10-CM

## 2019-10-03 DIAGNOSIS — F329 Major depressive disorder, single episode, unspecified: Secondary | ICD-10-CM | POA: Diagnosis present

## 2019-10-03 DIAGNOSIS — Z7951 Long term (current) use of inhaled steroids: Secondary | ICD-10-CM

## 2019-10-03 DIAGNOSIS — R0689 Other abnormalities of breathing: Secondary | ICD-10-CM | POA: Diagnosis not present

## 2019-10-03 DIAGNOSIS — M25562 Pain in left knee: Secondary | ICD-10-CM | POA: Diagnosis not present

## 2019-10-03 DIAGNOSIS — R000 Tachycardia, unspecified: Secondary | ICD-10-CM | POA: Diagnosis not present

## 2019-10-03 DIAGNOSIS — R52 Pain, unspecified: Secondary | ICD-10-CM | POA: Diagnosis not present

## 2019-10-03 LAB — COMPREHENSIVE METABOLIC PANEL
ALT: 19 U/L (ref 0–44)
AST: 19 U/L (ref 15–41)
Albumin: 3.7 g/dL (ref 3.5–5.0)
Alkaline Phosphatase: 56 U/L (ref 38–126)
Anion gap: 8 (ref 5–15)
BUN: 16 mg/dL (ref 8–23)
CO2: 21 mmol/L — ABNORMAL LOW (ref 22–32)
Calcium: 9.6 mg/dL (ref 8.9–10.3)
Chloride: 106 mmol/L (ref 98–111)
Creatinine, Ser: 0.6 mg/dL (ref 0.44–1.00)
GFR calc Af Amer: >60 mL/min (ref >60)
GFR calc non Af Amer: >60 mL/min (ref >60)
Glucose, Bld: 117 mg/dL — ABNORMAL HIGH (ref 70–99)
Potassium: 3.6 mmol/L (ref 3.5–5.1)
Sodium: 135 mmol/L (ref 135–145)
Total Bilirubin: 0.6 mg/dL (ref 0.3–1.2)
Total Protein: 6.6 g/dL (ref 6.5–8.1)

## 2019-10-03 LAB — RESPIRATORY PANEL BY RT PCR (FLU A&B, COVID)
Influenza A by PCR: NEGATIVE
Influenza B by PCR: NEGATIVE
SARS Coronavirus 2 by RT PCR: NEGATIVE

## 2019-10-03 LAB — CBC WITH DIFFERENTIAL/PLATELET
Abs Immature Granulocytes: 0.08 10*3/uL — ABNORMAL HIGH (ref 0.00–0.07)
Basophils Absolute: 0.1 10*3/uL (ref 0.0–0.1)
Basophils Relative: 1 %
Eosinophils Absolute: 0.1 10*3/uL (ref 0.0–0.5)
Eosinophils Relative: 0 %
HCT: 38.8 % (ref 36.0–46.0)
Hemoglobin: 12.3 g/dL (ref 12.0–15.0)
Immature Granulocytes: 1 %
Lymphocytes Relative: 5 %
Lymphs Abs: 0.6 10*3/uL — ABNORMAL LOW (ref 0.7–4.0)
MCH: 31.5 pg (ref 26.0–34.0)
MCHC: 31.7 g/dL (ref 30.0–36.0)
MCV: 99.2 fL (ref 80.0–100.0)
Monocytes Absolute: 1.1 10*3/uL — ABNORMAL HIGH (ref 0.1–1.0)
Monocytes Relative: 9 %
Neutro Abs: 9.9 10*3/uL — ABNORMAL HIGH (ref 1.7–7.7)
Neutrophils Relative %: 84 %
Platelets: 361 10*3/uL (ref 150–400)
RBC: 3.91 MIL/uL (ref 3.87–5.11)
RDW: 13.6 % (ref 11.5–15.5)
WBC: 11.9 10*3/uL — ABNORMAL HIGH (ref 4.0–10.5)
nRBC: 0 % (ref 0.0–0.2)

## 2019-10-03 MED ORDER — MORPHINE SULFATE (PF) 2 MG/ML IV SOLN
2.0000 mg | Freq: Once | INTRAVENOUS | Status: AC
  Administered 2019-10-03: 23:00:00 2 mg via INTRAVENOUS
  Filled 2019-10-03: qty 1

## 2019-10-03 MED ORDER — MORPHINE SULFATE (PF) 2 MG/ML IV SOLN
2.0000 mg | Freq: Once | INTRAVENOUS | Status: AC
  Administered 2019-10-03: 21:00:00 2 mg via INTRAVENOUS
  Filled 2019-10-03: qty 1

## 2019-10-03 NOTE — ED Provider Notes (Signed)
[facility] EMERGENCY DEPARTMENT Provider Note   CSN: [id number] Arrival date & time: 10/03/19  1821     History Chief Complaint  Patient presents with  . Hip Pain    left    Lindsey Butler is a 86 y.o. female.  HPI      Lindsey Butler is a 86 y.o. female with past medical history of asthma, GERD, and hypothyroidism presents to the Emergency Department complaining of left hip pain secondary to a mechanical fall that occurred earlier today at 1:30 PM.  Patient states that she was walking down the hall when her left knee "gave away" causing her to fall on her left side.  She did not strike her head or lose consciousness.  She was unable to get up without assistance.  She reports pain to the anterior and lateral left hip.  Pain is worse with attempted movement.  She denies nausea, vomiting, abdominal pain, chest pain, numbness or weakness of the extremity.  She denies use of blood thinners.    Past Medical History:  Diagnosis Date  . Actinic keratosis   . Asthma   . GERD (gastroesophageal reflux disease)   . Hyperlipidemia   . Hypothyroidism   . Osteoarthritis     Patient Active Problem List   Diagnosis Date Noted  . Asthma 09/14/2012  . Abnormal chest CT 06/18/2012  . Chronic cough 05/23/2012       OB History   No obstetric history on file.     Family History  Problem Relation Age of Onset  . Cancer Son   . Liver cancer Brother     Social History   Tobacco Use  . Smoking status: Never Smoker  . Smokeless tobacco: Never Used  Substance Use Topics  . Alcohol use: No  . Drug use: No    Home Medications Prior to Admission medications   Medication Sig Start Date End Date Taking? Authorizing Provider  budesonide (PULMICORT) 0.5 MG/2ML nebulizer solution Take 0.5 mg by nebulization 2 (two) times daily.    [provider]  ipratropium-albuterol (DUONEB) 0.5-2.5 (3) MG/3ML SOLN Take 3 mLs by nebulization every 6 (six) hours as needed.    [provider]  levothyroxine (SYNTHROID, LEVOTHROID) 100 MCG tablet Take 1 tablet by mouth Daily. 05/12/12   [provider]  loratadine (CLARITIN) 10 MG tablet Take 10 mg by mouth daily.    [provider]  montelukast (SINGULAIR) 10 MG tablet Take 1 tablet by mouth Daily. 04/21/12   [provider]  NEXIUM 40 MG capsule Take 1 tablet by mouth Daily. 05/12/12   [provider]  olmesartan (BENICAR) 20 MG tablet Take 10 mg by mouth daily.    [provider]  predniSONE (DELTASONE) 10 MG tablet Take 4 daily x 2 days, 2 daily x 2 days, 1 daily x 2 days then stop 09/17/12   Tanda Rockers, MD  predniSONE (DELTASONE) 20 MG tablet Take 5 tablets by mouth as needed.  04/05/12   [provider]    Allergies    Patient has no known allergies.  Review of Systems   Review of Systems  Constitutional: Negative for chills and fever.  Respiratory: Negative for shortness of breath.   Cardiovascular: Negative for chest pain.  Gastrointestinal: Negative for abdominal pain, nausea and vomiting.  Genitourinary: Negative for difficulty urinating and dysuria.  Musculoskeletal: Positive for arthralgias (Left hip and knee pain). Negative for back pain, joint swelling, neck pain and neck  stiffness.  Skin: Negative for wound.  Neurological: Negative for dizziness, syncope, weakness, numbness and headaches.    Physical Exam Updated Vital Signs BP (!) 188/85   Pulse 90   Temp 98.2 F (36.8 C) (Oral)   Resp 20   Ht 5\' 6"  (1.676 m)   Wt 66.7 kg   SpO2 97%   BMI 23.73 kg/m   Physical Exam Vitals and nursing note reviewed.  Constitutional:      Appearance: Normal appearance.  HENT:     Head: Atraumatic.     Mouth/Throat:     Mouth: Mucous membranes are moist.  Eyes:     Extraocular Movements: Extraocular movements intact.     Pupils: Pupils are equal, round, and reactive to light.  Cardiovascular:     Rate and Rhythm: Normal rate and regular  rhythm.     Pulses: Normal pulses.  Pulmonary:     Effort: Pulmonary effort is normal.     Breath sounds: Normal breath sounds.  Musculoskeletal:        General: Tenderness, deformity and signs of injury present.     Cervical back: Normal range of motion. No tenderness.     Comments: Left lower extremity is shortened and externally rotated.  Anterior left knee is tender to palpation without palpable effusion or crepitus.  Compartments are soft.  Skin:    General: Skin is warm.     Capillary Refill: Capillary refill takes less than 2 seconds.     Findings: No erythema or rash.  Neurological:     General: No focal deficit present.     Mental Status: She is alert.     GCS: GCS eye subscore is 4. GCS verbal subscore is 5. GCS motor subscore is 6.     Sensory: Sensation is intact.     Motor: Motor function is intact.     Coordination: Coordination is intact.     Comments: CN II-XII grossly intact.  Speech clear.       ED Results / Procedures / Treatments   Labs (all labs ordered are listed, but only abnormal results are displayed) Labs Reviewed  CBC WITH DIFFERENTIAL/PLATELET - Abnormal; Notable for the following components:      Result Value   WBC 11.9 (*)    Neutro Abs 9.9 (*)    Lymphs Abs 0.6 (*)    Monocytes Absolute 1.1 (*)    Abs Immature Granulocytes 0.08 (*)    All other components within normal limits  COMPREHENSIVE METABOLIC PANEL - Abnormal; Notable for the following components:   CO2 21 (*)    Glucose, Bld 117 (*)    All other components within normal limits  RESPIRATORY PANEL BY RT PCR (FLU A&B, COVID)  URINALYSIS, ROUTINE W REFLEX MICROSCOPIC    EKG EKG Interpretation  Date/Time:  Thursday October 03 2019 21:19:30 EST Ventricular Rate:  94 PR Interval:    QRS Duration: 107 QT Interval:  365 QTC Calculation: 459 R Axis:   68 Text Interpretation: Sinus rhythm Borderline T abnormalities, inferior leads No previous ECGs available Confirmed by Fredia Sorrow [phone]) on 10/03/2019 9:22:12 PM   Radiology DG Chest 1 View  Result Date: 10/03/2019 CLINICAL DATA:  Injury. EXAM: CHEST  1 VIEW COMPARISON:  06/01/2019 FINDINGS: The heart size is enlarged. There is a large hiatal hernia. There is no pneumothorax. No large pleural effusion. There may be some scarring versus atelectasis at the lung bases. Advanced degenerative changes are noted of both glenohumeral joints, right  worse than left. Aortic calcifications are noted. IMPRESSION: Cardiomegaly with large hiatal hernia. No evidence for acute disease or significant interval change from prior. Electronically Signed   By: Constance Holster M.D.   On: 10/03/2019 21:02   DG Knee 1-2 Views Left  Result Date: 10/03/2019 CLINICAL DATA:  Pain EXAM: LEFT KNEE - 1-2 VIEW COMPARISON:  February 16, 2019. FINDINGS: The patient is status post total knee arthroplasty on the left. The hardware appears to be grossly intact. There may be a trace suprapatellar joint effusion. There is no evidence for area a periprosthetic fracture. IMPRESSION: No acute displaced fracture. Electronically Signed   By: Constance Holster M.D.   On: 10/03/2019 21:04   DG Hip Unilat W or Wo Pelvis 2-3 Views Left  Result Date: 10/03/2019 CLINICAL DATA:  [place], left hip pain EXAM: DG HIP (WITH OR WITHOUT PELVIS) 2-3V LEFT COMPARISON:  05/28/2019 FINDINGS: Frontal view of the pelvis as well as frontal and cross-table lateral views of the left hip are obtained. There is a subcapital left femoral neck fracture with proximal migration of the femur. Femoral head articulates normally with the left acetabulum. Bilateral hip osteoarthritis unchanged. The remainder of the bony pelvis is stable. Postsurgical changes mid lumbar spine. IMPRESSION: 1. Impacted subcapital left femoral neck fracture. Electronically Signed   By: Randa Ngo M.D.   On: 10/03/2019 20:55   DG Femur Min 2 Views Left  Result Date: 10/03/2019 CLINICAL DATA:  [place], left hip pain EXAM:  LEFT FEMUR 2 VIEWS COMPARISON:  05/28/2019 FINDINGS: Frontal and lateral views of the left femur demonstrates an impacted subcapital femoral neck fracture, with proximal migration of the femur relative to the femoral head. No dislocation. Left knee arthroplasty appears well aligned. Soft tissues are unremarkable. IMPRESSION: 1. Impacted subcapital left femoral neck fracture. Electronically Signed   By: Randa Ngo M.D.   On: 10/03/2019 20:56    Procedures Procedures (including critical care time)  Medications Ordered in ED Medications  morphine 2 MG/ML injection 2 mg (2 mg Intravenous Given 10/03/19 2111)  morphine 2 MG/ML injection 2 mg (2 mg Intravenous Given 10/03/19 2253)    ED Course  I have reviewed the triage vital signs and the nursing notes.  Pertinent labs & imaging results that were available during my care of the patient were reviewed by me and considered in my medical decision making (see chart for details).    MDM Rules/Calculators/A&P                      Patient with impacted subcapital fracture of the femoral neck secondary to mechanical fall.  Neurovascularly intact.  Will consult orthopedics.  Consulted Dr. Aline Brochure and discussed findings.  He requests that patient be admitted to hospitalist service and he will consult in the a.m.    I spoke with patient's daughter-in-law, Lindsey Butler at [phone] and discussed findings with patient's permission.  She requests to be called in the morning with update  Final Clinical Impression(s) / ED Diagnoses Final diagnoses:  Subcapital fracture of femur, left, closed, initial encounter [facility])    Rx / DC Orders ED Discharge Orders    None       Kem Parkinson, PA-C 10/03/19 2258    Fredia Sorrow, MD 10/04/19 [phone]

## 2019-10-03 NOTE — ED Provider Notes (Signed)
Medical screening examination/treatment/procedure(s) were conducted as a shared visit with non-physician practitioner(s) and myself.  I personally evaluated the patient during the encounter.  EKG Interpretation  Date/Time:  Thursday October 03 2019 21:19:30 EST Ventricular Rate:  94 PR Interval:    QRS Duration: 107 QT Interval:  365 QTC Calculation: 459 R Axis:   68 Text Interpretation: Sinus rhythm Borderline T abnormalities, inferior leads No previous ECGs available Confirmed by Fredia Sorrow [phone]) on 10/03/2019 9:22:12 PM   Results for orders placed or performed during the hospital encounter of 10/03/19  CBC with Differential  Result Value Ref Range   WBC 11.9 (H) 4.0 - 10.5 K/uL   RBC 3.91 3.87 - 5.11 MIL/uL   Hemoglobin 12.3 12.0 - 15.0 g/dL   HCT 38.8 36.0 - 46.0 %   MCV 99.2 80.0 - 100.0 fL   MCH 31.5 26.0 - 34.0 pg   MCHC 31.7 30.0 - 36.0 g/dL   RDW 13.6 11.5 - 15.5 %   Platelets 361 150 - 400 K/uL   nRBC 0.0 0.0 - 0.2 %   Neutrophils Relative % 84 %   Neutro Abs 9.9 (H) 1.7 - 7.7 K/uL   Lymphocytes Relative 5 %   Lymphs Abs 0.6 (L) 0.7 - 4.0 K/uL   Monocytes Relative 9 %   Monocytes Absolute 1.1 (H) 0.1 - 1.0 K/uL   Eosinophils Relative 0 %   Eosinophils Absolute 0.1 0.0 - 0.5 K/uL   Basophils Relative 1 %   Basophils Absolute 0.1 0.0 - 0.1 K/uL   Immature Granulocytes 1 %   Abs Immature Granulocytes 0.08 (H) 0.00 - 0.07 K/uL  Comprehensive metabolic panel  Result Value Ref Range   Sodium 135 135 - 145 mmol/L   Potassium 3.6 3.5 - 5.1 mmol/L   Chloride 106 98 - 111 mmol/L   CO2 21 (L) 22 - 32 mmol/L   Glucose, Bld 117 (H) 70 - 99 mg/dL   BUN 16 8 - 23 mg/dL   Creatinine, Ser 0.60 0.44 - 1.00 mg/dL   Calcium 9.6 8.9 - 10.3 mg/dL   Total Protein 6.6 6.5 - 8.1 g/dL   Albumin 3.7 3.5 - 5.0 g/dL   AST 19 15 - 41 U/L   ALT 19 0 - 44 U/L   Alkaline Phosphatase 56 38 - 126 U/L   Total Bilirubin 0.6 0.3 - 1.2 mg/dL   GFR calc non Af Amer >60 >60 mL/min   GFR calc Af Amer >60 >60 mL/min   Anion gap 8 5 - 15   DG Chest 1 View  Result Date: 10/03/2019 CLINICAL DATA:  Injury. EXAM: CHEST  1 VIEW COMPARISON:  06/01/2019 FINDINGS: The heart size is enlarged. There is a large hiatal hernia. There is no pneumothorax. No large pleural effusion. There may be some scarring versus atelectasis at the lung bases. Advanced degenerative changes are noted of both glenohumeral joints, right worse than left. Aortic calcifications are noted. IMPRESSION: Cardiomegaly with large hiatal hernia. No evidence for acute disease or significant interval change from prior. Electronically Signed   By: Constance Holster M.D.   On: 10/03/2019 21:02   DG Knee 1-2 Views Left  Result Date: 10/03/2019 CLINICAL DATA:  Pain EXAM: LEFT KNEE - 1-2 VIEW COMPARISON:  February 16, 2019. FINDINGS: The patient is status post total knee arthroplasty on the left. The hardware appears to be grossly intact. There may be a trace suprapatellar joint effusion. There is no evidence for area a periprosthetic fracture. IMPRESSION:  No acute displaced fracture. Electronically Signed   By: Constance Holster M.D.   On: 10/03/2019 21:04   DG Hip Unilat W or Wo Pelvis 2-3 Views Left  Result Date: 10/03/2019 CLINICAL DATA:  [place], left hip pain EXAM: DG HIP (WITH OR WITHOUT PELVIS) 2-3V LEFT COMPARISON:  05/28/2019 FINDINGS: Frontal view of the pelvis as well as frontal and cross-table lateral views of the left hip are obtained. There is a subcapital left femoral neck fracture with proximal migration of the femur. Femoral head articulates normally with the left acetabulum. Bilateral hip osteoarthritis unchanged. The remainder of the bony pelvis is stable. Postsurgical changes mid lumbar spine. IMPRESSION: 1. Impacted subcapital left femoral neck fracture. Electronically Signed   By: Randa Ngo M.D.   On: 10/03/2019 20:55   DG Femur Min 2 Views Left  Result Date: 10/03/2019 CLINICAL DATA:  [place], left hip pain  EXAM: LEFT FEMUR 2 VIEWS COMPARISON:  05/28/2019 FINDINGS: Frontal and lateral views of the left femur demonstrates an impacted subcapital femoral neck fracture, with proximal migration of the femur relative to the femoral head. No dislocation. Left knee arthroplasty appears well aligned. Soft tissues are unremarkable. IMPRESSION: 1. Impacted subcapital left femoral neck fracture. Electronically Signed   By: Randa Ngo M.D.   On: 10/03/2019 20:56    Patient seen by me.  Patient had a fall at home at around 130 today.  Brought in by EMS.  Patient got here around 6:30 PM.  Patient denies any other pains other than pain to the left hip area denies hitting her head or any loss of consciousness.  Past medical history is significant for asthma hyperlipidemia hypothyroidism.  On exam patient's left leg is shortened.  Does have good cap refill.  Does have movement of the toes.  Right leg normal.  No other significant findings.  Labs mild leukocytosis electrolytes without significant abnormality including liver function test.  Chest x-ray showed cardiomegaly no evidence for acute disease.  Patient will need Covid testing.  Physician assistant will contact Dr. Aline Brochure from orthopedics.  Will probably require medicine admission to get the hip fixed.   Fredia Sorrow, MD 10/03/19 2219

## 2019-10-03 NOTE — ED Triage Notes (Signed)
Patient presents to the ED due to a fall at home around 1330 today.  Family assisted patient back to chair.  Patient began to complain of left hip pain when tried to get up to the bathroom.  Per EMS patient had fallen when her left knee "buckled " upon standing.  Patient has had knee surgery last fall.

## 2019-10-03 NOTE — H&P (Signed)
History and Physical    Patient Demographics:    Lindsey Butler [id number] DOB: 1929-07-23 DOA: 10/03/2019  PCP: Neale Burly, MD  Patient coming from: Home  I have personally briefly reviewed patient's old medical records in [place]  Chief Complaint: Fall at home    Assessment & Plan:     Assessment/Plan Principal Problem:   Fracture of femoral neck, left ([place]) Active Problems:   Closed left hip fracture ([place])     Principal Problem: Left impacted femoral neck fracture Patient had a mechanical fall at home around 1:30 PM when she was walking down the hall and says her left knee gave way it caused her to fall to the side.  No head injury, no loss of consciousness.  Unable to get up without assistance.  Found to have a left femoral neck fracture on admission.  Neurovascularly intact.  No motor or sensory deficits.  Case discussed with orthopedic surgeon on call will plan for intervention in a.m. patient is medically clear for surgical intervention. -Bedrest -Keep n.p.o. -Pain meds as needed -Orthopedic surgery consult  Other Active Problems: Hypertension -Continue losartan  Hypothyroidism -Continue levothyroxine  Depression -Continue sertraline  GERD -Continue pantoprazole  Asthma Not in acute exacerbation -Continue Pulmicort, DuoNebs as needed   DVT prophylaxis: Lovenox Code Status:  Full code Family Communication: N/A  Disposition Plan: admitted as inpatient for femoral neck fracture  Consults called: N/A Admission status: inpatient status    HPI:     HPI: Lindsey Butler is a 86 y.o. female with medical history significant of hypertension, hypothyroidism, depression, GERD, asthma who presented to the ER with a fall at home with left hip pain.  Patient had a mechanical fall at home around 1:30 PM when she was walking down the hall and says her left knee gave way it caused her to fall to the side.  No head injury, no loss of consciousness.   Unable to get up without assistance.  Found to have a left femoral neck fracture on admission.  Case discussed with orthopedic surgeon on call will plan for intervention in a.m. ED Course:  Vital Signs reviewed on presentation, significant for temperature 98.2, heart rate 90, blood pressure 180/85, saturation 97% on room air. Labs reviewed, significant for sodium 135, potassium 3.6, chloride 106, BUN 16, creatinine 0.6, LFTs normal limits, WBC count 9.9, hemoglobin 12.3, hematocrit 38, platelets 361. Imaging personally Reviewed, left hip with pelvis x-ray shows impacted subcapital left femoral neck fracture.  Left knee x-ray shows no acute abnormalities.  Chest x-ray shows scarring.  Lung bases.   EKG personally reviewed, shows sinus rhythm, no acute ST-T changes.    Review of systems:    Review of Systems: As per HPI otherwise 10 point review of systems negative.  All other review of systems is negative except the ones noted above in the HPI.    Past Medical and Surgical History:  Reviewed by me  Past Medical History:  Diagnosis Date  . Actinic keratosis   . Asthma   . GERD (gastroesophageal reflux disease)   . Hyperlipidemia   . Hypothyroidism   . Osteoarthritis       Social History:  Reviewed by me   reports that she has never smoked. She has never used smokeless tobacco. She reports that she does not drink alcohol or use drugs.  Allergies:    No Known Allergies  Family History :   Family History  Problem Relation Age of Onset  .  Cancer Son   . Liver cancer Brother    Family history reviewed, noted as above, not pertinent to current presentation.   Home Medications:    Prior to Admission medications   Medication Sig Start Date End Date Taking? Authorizing Provider  budesonide (PULMICORT) 0.5 MG/2ML nebulizer solution Take 0.5 mg by nebulization 2 (two) times daily.    [provider]  ipratropium-albuterol (DUONEB) 0.5-2.5 (3) MG/3ML SOLN Take 3 mLs by  nebulization every 6 (six) hours as needed.    [provider]  levothyroxine (SYNTHROID, LEVOTHROID) 100 MCG tablet Take 1 tablet by mouth Daily. 05/12/12   [provider]  loratadine (CLARITIN) 10 MG tablet Take 10 mg by mouth daily.    [provider]  montelukast (SINGULAIR) 10 MG tablet Take 1 tablet by mouth Daily. 04/21/12   [provider]  NEXIUM 40 MG capsule Take 1 tablet by mouth Daily. 05/12/12   [provider]  olmesartan (BENICAR) 20 MG tablet Take 10 mg by mouth daily.    [provider]  predniSONE (DELTASONE) 10 MG tablet Take 4 daily x 2 days, 2 daily x 2 days, 1 daily x 2 days then stop 09/17/12   Tanda Rockers, MD  predniSONE (DELTASONE) 20 MG tablet Take 5 tablets by mouth as needed.  04/05/12   [provider]    Physical Exam:    Physical Exam: Vitals:   10/04/19 0030 10/04/19 0101 10/04/19 0157 10/04/19 0302  BP: (!) 192/87 (!) 186/96 (!) 165/89 (!) 181/90  Pulse: 95 (!) 105 (!) 102 (!) 105  Resp: 19 20 16 18   Temp:  97.7 F (36.5 C) 97.9 F (36.6 C) 98.5 F (36.9 C)  TempSrc:  Oral Oral Oral  SpO2: 99% 98% 100% 98%  Weight:  62.8 kg    Height:  5\' 6"  (1.676 m)      Constitutional: NAD, calm, comfortable Vitals:   10/04/19 0030 10/04/19 0101 10/04/19 0157 10/04/19 0302  BP: (!) 192/87 (!) 186/96 (!) 165/89 (!) 181/90  Pulse: 95 (!) 105 (!) 102 (!) 105  Resp: 19 20 16 18   Temp:  97.7 F (36.5 C) 97.9 F (36.6 C) 98.5 F (36.9 C)  TempSrc:  Oral Oral Oral  SpO2: 99% 98% 100% 98%  Weight:  62.8 kg    Height:  5\' 6"  (1.676 m)     Eyes: PERRL, lids and conjunctivae normal ENMT: Mucous membranes are moist. Posterior pharynx clear of any exudate or lesions.Normal dentition.  Neck: normal, supple, no masses, no thyromegaly Respiratory: clear to auscultation bilaterally, no wheezing, no crackles. Normal respiratory effort. No accessory muscle use.  Cardiovascular: Regular rate and rhythm, no  murmurs / rubs / gallops. No extremity edema. 2+ pedal pulses. No carotid bruits.  Abdomen: no tenderness, no masses palpated. No hepatosplenomegaly. Bowel sounds positive.  Musculoskeletal: no clubbing / cyanosis.  Left lower extremity with external rotation and shortening, significant tenderness over the left hip, pain on active and passive movement of the left hip, normal sensations, peripheral pulses normal, normal muscle tone.  Skin: no rashes, lesions, ulcers. No induration Neurologic: CN 2-12 grossly intact. Sensation intact, DTR normal. Strength 5/5 in all 4.  Psychiatric: Normal judgment and insight. Alert and oriented x 3. Normal mood.    Decubitus Ulcers: Not present on admission Catheters and tubes: None  Data Review:    Labs on Admission: I have personally reviewed following labs and imaging studies  CBC: Recent Labs  Lab 10/03/19 2055  WBC 11.9*  NEUTROABS 9.9*  HGB 12.3  HCT 38.8  MCV 99.2  PLT [id number]   Basic Metabolic Panel: Recent Labs  Lab 10/03/19 2055  NA 135  K 3.6  CL 106  CO2 21*  GLUCOSE 117*  BUN 16  CREATININE 0.60  CALCIUM 9.6   GFR: Estimated Creatinine Clearance: 45.5 mL/min (by C-G formula based on SCr of 0.6 mg/dL). Liver Function Tests: Recent Labs  Lab 10/03/19 2055  AST 19  ALT 19  ALKPHOS 56  BILITOT 0.6  PROT 6.6  ALBUMIN 3.7   No results for input(s): LIPASE, AMYLASE in the last 168 hours. No results for input(s): AMMONIA in the last 168 hours. Coagulation Profile: No results for input(s): INR, PROTIME in the last 168 hours. Cardiac Enzymes: No results for input(s): CKTOTAL, CKMB, CKMBINDEX, TROPONINI in the last 168 hours. BNP (last 3 results) No results for input(s): PROBNP in the last 8760 hours. HbA1C: No results for input(s): HGBA1C in the last 72 hours. CBG: No results for input(s): GLUCAP in the last 168 hours. Lipid Profile: No results for input(s): CHOL, HDL, LDLCALC, TRIG, CHOLHDL, LDLDIRECT in the last 72  hours. Thyroid Function Tests: No results for input(s): TSH, T4TOTAL, FREET4, T3FREE, THYROIDAB in the last 72 hours. Anemia Panel: No results for input(s): VITAMINB12, FOLATE, FERRITIN, TIBC, IRON, RETICCTPCT in the last 72 hours. Urine analysis:    Component Value Date/Time   COLORURINE YELLOW 10/04/2019 0130   APPEARANCEUR CLEAR 10/04/2019 0130   LABSPEC 1.010 10/04/2019 0130   PHURINE 7.0 10/04/2019 0130   GLUCOSEU NEGATIVE 10/04/2019 0130   HGBUR NEGATIVE 10/04/2019 0130   BILIRUBINUR NEGATIVE 10/04/2019 0130   KETONESUR NEGATIVE 10/04/2019 0130   PROTEINUR NEGATIVE 10/04/2019 0130   NITRITE NEGATIVE 10/04/2019 0130   LEUKOCYTESUR NEGATIVE 10/04/2019 0130     Imaging Results:      Radiological Exams on Admission: DG Chest 1 View  Result Date: 10/03/2019 CLINICAL DATA:  Injury. EXAM: CHEST  1 VIEW COMPARISON:  06/01/2019 FINDINGS: The heart size is enlarged. There is a large hiatal hernia. There is no pneumothorax. No large pleural effusion. There may be some scarring versus atelectasis at the lung bases. Advanced degenerative changes are noted of both glenohumeral joints, right worse than left. Aortic calcifications are noted. IMPRESSION: Cardiomegaly with large hiatal hernia. No evidence for acute disease or significant interval change from prior. Electronically Signed   By: Constance Holster M.D.   On: 10/03/2019 21:02   DG Knee 1-2 Views Left  Result Date: 10/03/2019 CLINICAL DATA:  Pain EXAM: LEFT KNEE - 1-2 VIEW COMPARISON:  February 16, 2019. FINDINGS: The patient is status post total knee arthroplasty on the left. The hardware appears to be grossly intact. There may be a trace suprapatellar joint effusion. There is no evidence for area a periprosthetic fracture. IMPRESSION: No acute displaced fracture. Electronically Signed   By: Constance Holster M.D.   On: 10/03/2019 21:04   DG Hip Unilat W or Wo Pelvis 2-3 Views Left  Result Date: 10/03/2019 CLINICAL DATA:  [place], left  hip pain EXAM: DG HIP (WITH OR WITHOUT PELVIS) 2-3V LEFT COMPARISON:  05/28/2019 FINDINGS: Frontal view of the pelvis as well as frontal and cross-table lateral views of the left hip are obtained. There is a subcapital left femoral neck fracture with proximal migration of the femur. Femoral head articulates normally with the left acetabulum. Bilateral hip osteoarthritis unchanged. The remainder of the bony pelvis is stable. Postsurgical changes mid lumbar spine. IMPRESSION:  1. Impacted subcapital left femoral neck fracture. Electronically Signed   By: Randa Ngo M.D.   On: 10/03/2019 20:55   DG Femur Min 2 Views Left  Result Date: 10/03/2019 CLINICAL DATA:  [place], left hip pain EXAM: LEFT FEMUR 2 VIEWS COMPARISON:  05/28/2019 FINDINGS: Frontal and lateral views of the left femur demonstrates an impacted subcapital femoral neck fracture, with proximal migration of the femur relative to the femoral head. No dislocation. Left knee arthroplasty appears well aligned. Soft tissues are unremarkable. IMPRESSION: 1. Impacted subcapital left femoral neck fracture. Electronically Signed   By: Randa Ngo M.D.   On: 10/03/2019 20:56      [facility] Ginette Otto MD [facility]  If 7PM-7AM, please contact night-coverage   10/04/2019, 3:24 AM

## 2019-10-04 ENCOUNTER — Encounter (HOSPITAL_COMMUNITY)
Admission: EM | Disposition: A | Discharge status: Skilled Nursing Facility | Payer: Self-pay | Source: Home / Self Care | Attending: Internal Medicine

## 2019-10-04 ENCOUNTER — Inpatient Hospital Stay (HOSPITAL_COMMUNITY): Payer: Medicare Other

## 2019-10-04 ENCOUNTER — Inpatient Hospital Stay (HOSPITAL_COMMUNITY): Payer: Medicare Other | Admitting: Anesthesiology

## 2019-10-04 ENCOUNTER — Encounter (HOSPITAL_COMMUNITY): Payer: Self-pay | Admitting: Internal Medicine

## 2019-10-04 DIAGNOSIS — Z20822 Contact with and (suspected) exposure to covid-19: Secondary | ICD-10-CM | POA: Diagnosis not present

## 2019-10-04 DIAGNOSIS — E785 Hyperlipidemia, unspecified: Secondary | ICD-10-CM | POA: Diagnosis present

## 2019-10-04 DIAGNOSIS — F329 Major depressive disorder, single episode, unspecified: Secondary | ICD-10-CM | POA: Diagnosis present

## 2019-10-04 DIAGNOSIS — Z7951 Long term (current) use of inhaled steroids: Secondary | ICD-10-CM | POA: Diagnosis not present

## 2019-10-04 DIAGNOSIS — S72002D Fracture of unspecified part of neck of left femur, subsequent encounter for closed fracture with routine healing: Secondary | ICD-10-CM | POA: Diagnosis not present

## 2019-10-04 DIAGNOSIS — Y92008 Other place in unspecified non-institutional (private) residence as the place of occurrence of the external cause: Secondary | ICD-10-CM | POA: Diagnosis not present

## 2019-10-04 DIAGNOSIS — Z79890 Hormone replacement therapy (postmenopausal): Secondary | ICD-10-CM | POA: Diagnosis not present

## 2019-10-04 DIAGNOSIS — M1990 Unspecified osteoarthritis, unspecified site: Secondary | ICD-10-CM | POA: Diagnosis present

## 2019-10-04 DIAGNOSIS — K449 Diaphragmatic hernia without obstruction or gangrene: Secondary | ICD-10-CM | POA: Diagnosis not present

## 2019-10-04 DIAGNOSIS — I10 Essential (primary) hypertension: Secondary | ICD-10-CM | POA: Diagnosis not present

## 2019-10-04 DIAGNOSIS — M6281 Muscle weakness (generalized): Secondary | ICD-10-CM | POA: Diagnosis not present

## 2019-10-04 DIAGNOSIS — Z79899 Other long term (current) drug therapy: Secondary | ICD-10-CM | POA: Diagnosis not present

## 2019-10-04 DIAGNOSIS — S31821A Laceration without foreign body of left buttock, initial encounter: Secondary | ICD-10-CM | POA: Diagnosis not present

## 2019-10-04 DIAGNOSIS — J45909 Unspecified asthma, uncomplicated: Secondary | ICD-10-CM | POA: Diagnosis not present

## 2019-10-04 DIAGNOSIS — W1839XA Other fall on same level, initial encounter: Secondary | ICD-10-CM | POA: Diagnosis present

## 2019-10-04 DIAGNOSIS — Z471 Aftercare following joint replacement surgery: Secondary | ICD-10-CM | POA: Diagnosis not present

## 2019-10-04 DIAGNOSIS — R600 Localized edema: Secondary | ICD-10-CM | POA: Diagnosis not present

## 2019-10-04 DIAGNOSIS — K219 Gastro-esophageal reflux disease without esophagitis: Secondary | ICD-10-CM | POA: Diagnosis not present

## 2019-10-04 DIAGNOSIS — Z741 Need for assistance with personal care: Secondary | ICD-10-CM | POA: Diagnosis not present

## 2019-10-04 DIAGNOSIS — S72045A Nondisplaced fracture of base of neck of left femur, initial encounter for closed fracture: Secondary | ICD-10-CM | POA: Diagnosis not present

## 2019-10-04 DIAGNOSIS — S72012A Unspecified intracapsular fracture of left femur, initial encounter for closed fracture: Secondary | ICD-10-CM | POA: Diagnosis not present

## 2019-10-04 DIAGNOSIS — Z9181 History of falling: Secondary | ICD-10-CM | POA: Diagnosis not present

## 2019-10-04 DIAGNOSIS — E039 Hypothyroidism, unspecified: Secondary | ICD-10-CM | POA: Diagnosis not present

## 2019-10-04 DIAGNOSIS — E890 Postprocedural hypothyroidism: Secondary | ICD-10-CM | POA: Diagnosis not present

## 2019-10-04 DIAGNOSIS — S72002A Fracture of unspecified part of neck of left femur, initial encounter for closed fracture: Secondary | ICD-10-CM | POA: Diagnosis not present

## 2019-10-04 DIAGNOSIS — D649 Anemia, unspecified: Secondary | ICD-10-CM | POA: Diagnosis not present

## 2019-10-04 DIAGNOSIS — R262 Difficulty in walking, not elsewhere classified: Secondary | ICD-10-CM | POA: Diagnosis not present

## 2019-10-04 DIAGNOSIS — Z96642 Presence of left artificial hip joint: Secondary | ICD-10-CM | POA: Diagnosis not present

## 2019-10-04 HISTORY — PX: HIP ARTHROPLASTY: SHX981

## 2019-10-04 LAB — BASIC METABOLIC PANEL
Anion gap: 10 (ref 5–15)
BUN: 14 mg/dL (ref 8–23)
CO2: 21 mmol/L — ABNORMAL LOW (ref 22–32)
Calcium: 9.5 mg/dL (ref 8.9–10.3)
Chloride: 104 mmol/L (ref 98–111)
Creatinine, Ser: 0.54 mg/dL (ref 0.44–1.00)
GFR calc Af Amer: >60 mL/min (ref >60)
GFR calc non Af Amer: >60 mL/min (ref >60)
Glucose, Bld: 131 mg/dL — ABNORMAL HIGH (ref 70–99)
Potassium: 3.6 mmol/L (ref 3.5–5.1)
Sodium: 135 mmol/L (ref 135–145)

## 2019-10-04 LAB — URINALYSIS, ROUTINE W REFLEX MICROSCOPIC
Bilirubin Urine: NEGATIVE
Glucose, UA: NEGATIVE mg/dL
Hgb urine dipstick: NEGATIVE
Ketones, ur: NEGATIVE mg/dL
Leukocytes,Ua: NEGATIVE
Nitrite: NEGATIVE
Protein, ur: NEGATIVE mg/dL
Specific Gravity, Urine: 1.01 (ref 1.005–1.030)
pH: 7 (ref 5.0–8.0)

## 2019-10-04 LAB — CBC
HCT: 38.7 % (ref 36.0–46.0)
Hemoglobin: 12.7 g/dL (ref 12.0–15.0)
MCH: 31.8 pg (ref 26.0–34.0)
MCHC: 32.8 g/dL (ref 30.0–36.0)
MCV: 97 fL (ref 80.0–100.0)
Platelets: 381 10*3/uL (ref 150–400)
RBC: 3.99 MIL/uL (ref 3.87–5.11)
RDW: 13.4 % (ref 11.5–15.5)
WBC: 12.2 10*3/uL — ABNORMAL HIGH (ref 4.0–10.5)
nRBC: 0 % (ref 0.0–0.2)

## 2019-10-04 LAB — SURGICAL PCR SCREEN
MRSA, PCR: NEGATIVE
Staphylococcus aureus: NEGATIVE

## 2019-10-04 LAB — PREPARE RBC (CROSSMATCH)

## 2019-10-04 LAB — ABO/RH: ABO/RH(D): O POS

## 2019-10-04 SURGERY — HEMIARTHROPLASTY, HIP, DIRECT ANTERIOR APPROACH, FOR FRACTURE
Anesthesia: General | Site: Hip | Laterality: Left

## 2019-10-04 MED ORDER — CEFAZOLIN SODIUM-DEXTROSE 2-4 GM/100ML-% IV SOLN
INTRAVENOUS | Status: AC
  Filled 2019-10-04: qty 100

## 2019-10-04 MED ORDER — HYDROCODONE-ACETAMINOPHEN 5-325 MG PO TABS
1.0000 | ORAL_TABLET | Freq: Four times a day (QID) | ORAL | Status: DC | PRN
  Administered 2019-10-04: 1 via ORAL
  Administered 2019-10-05 – 2019-10-07 (×7): 2 via ORAL
  Administered 2019-10-08 (×2): 1 via ORAL
  Filled 2019-10-04: qty 2
  Filled 2019-10-04: qty 1
  Filled 2019-10-04 (×5): qty 2
  Filled 2019-10-04: qty 1
  Filled 2019-10-04: qty 2
  Filled 2019-10-04: qty 1

## 2019-10-04 MED ORDER — LOSARTAN POTASSIUM 50 MG PO TABS
50.0000 mg | ORAL_TABLET | Freq: Every day | ORAL | Status: DC
  Administered 2019-10-05 – 2019-10-08 (×4): 50 mg via ORAL
  Filled 2019-10-04 (×6): qty 1

## 2019-10-04 MED ORDER — CHLORHEXIDINE GLUCONATE 4 % EX LIQD
60.0000 mL | Freq: Once | CUTANEOUS | Status: AC
  Administered 2019-10-04: 4 via TOPICAL
  Filled 2019-10-04: qty 60

## 2019-10-04 MED ORDER — PHENYLEPHRINE HCL (PRESSORS) 10 MG/ML IV SOLN
INTRAVENOUS | Status: DC | PRN
  Administered 2019-10-04: 40 ug via INTRAVENOUS
  Administered 2019-10-04: 80 ug via INTRAVENOUS
  Administered 2019-10-04 (×3): 40 ug via INTRAVENOUS

## 2019-10-04 MED ORDER — PROPOFOL 10 MG/ML IV BOLUS
INTRAVENOUS | Status: DC | PRN
  Administered 2019-10-04: 100 mg via INTRAVENOUS

## 2019-10-04 MED ORDER — ENOXAPARIN SODIUM 40 MG/0.4ML ~~LOC~~ SOLN
40.0000 mg | SUBCUTANEOUS | Status: DC
  Administered 2019-10-04 – 2019-10-08 (×4): 40 mg via SUBCUTANEOUS
  Filled 2019-10-04 (×4): qty 0.4

## 2019-10-04 MED ORDER — DOCUSATE SODIUM 100 MG PO CAPS
100.0000 mg | ORAL_CAPSULE | Freq: Two times a day (BID) | ORAL | Status: DC
  Administered 2019-10-04 – 2019-10-08 (×8): 100 mg via ORAL
  Filled 2019-10-04 (×12): qty 1

## 2019-10-04 MED ORDER — ONDANSETRON HCL 4 MG/2ML IJ SOLN
INTRAMUSCULAR | Status: AC
  Filled 2019-10-04: qty 2

## 2019-10-04 MED ORDER — SERTRALINE HCL 50 MG PO TABS
25.0000 mg | ORAL_TABLET | Freq: Every day | ORAL | Status: DC
  Administered 2019-10-05 – 2019-10-08 (×4): 25 mg via ORAL
  Filled 2019-10-04 (×6): qty 1

## 2019-10-04 MED ORDER — ONDANSETRON HCL 4 MG/2ML IJ SOLN
4.0000 mg | Freq: Four times a day (QID) | INTRAMUSCULAR | Status: DC | PRN
  Administered 2019-10-04: 19:00:00 4 mg via INTRAVENOUS
  Filled 2019-10-04: qty 2

## 2019-10-04 MED ORDER — ACETAZOLAMIDE 250 MG PO TABS
250.0000 mg | ORAL_TABLET | Freq: Two times a day (BID) | ORAL | Status: DC
  Administered 2019-10-04 – 2019-10-08 (×8): 250 mg via ORAL
  Filled 2019-10-04 (×11): qty 1

## 2019-10-04 MED ORDER — LACTATED RINGERS IV SOLN
INTRAVENOUS | Status: DC
  Administered 2019-10-04: 13:00:00 1000 mL via INTRAVENOUS

## 2019-10-04 MED ORDER — FENTANYL CITRATE (PF) 100 MCG/2ML IJ SOLN
25.0000 ug | INTRAMUSCULAR | Status: DC | PRN
  Administered 2019-10-04 (×2): 25 ug via INTRAVENOUS

## 2019-10-04 MED ORDER — PHENYLEPHRINE 40 MCG/ML (10ML) SYRINGE FOR IV PUSH (FOR BLOOD PRESSURE SUPPORT)
PREFILLED_SYRINGE | INTRAVENOUS | Status: AC
  Filled 2019-10-04: qty 10

## 2019-10-04 MED ORDER — SODIUM CHLORIDE 0.9 % IR SOLN
Status: DC | PRN
  Administered 2019-10-04: 3000 mL

## 2019-10-04 MED ORDER — SUCCINYLCHOLINE 20MG/ML (10ML) SYRINGE FOR MEDFUSION PUMP - OPTIME
INTRAMUSCULAR | Status: DC | PRN
  Administered 2019-10-04: 100 mg via INTRAVENOUS

## 2019-10-04 MED ORDER — SODIUM CHLORIDE 0.9% IV SOLUTION: Freq: Once | INTRAVENOUS | Status: DC

## 2019-10-04 MED ORDER — DOCUSATE SODIUM 100 MG PO CAPS
100.0000 mg | ORAL_CAPSULE | Freq: Two times a day (BID) | ORAL | Status: DC
  Filled 2019-10-04: qty 1

## 2019-10-04 MED ORDER — FENTANYL CITRATE (PF) 100 MCG/2ML IJ SOLN
INTRAMUSCULAR | Status: AC
  Filled 2019-10-04: qty 2

## 2019-10-04 MED ORDER — PROMETHAZINE HCL 25 MG/ML IJ SOLN: 6.2500 mg | INTRAMUSCULAR | Status: DC | PRN

## 2019-10-04 MED ORDER — MONTELUKAST SODIUM 10 MG PO TABS
10.0000 mg | ORAL_TABLET | Freq: Every day | ORAL | Status: DC
  Administered 2019-10-04 – 2019-10-07 (×4): 10 mg via ORAL
  Filled 2019-10-04 (×4): qty 1

## 2019-10-04 MED ORDER — BUPIVACAINE LIPOSOME 1.3 % IJ SUSP
INTRAMUSCULAR | Status: DC | PRN
  Administered 2019-10-04: 20 mL

## 2019-10-04 MED ORDER — LIDOCAINE HCL 1 % IJ SOLN
INTRAMUSCULAR | Status: DC | PRN
  Administered 2019-10-04: 50 mg via INTRADERMAL

## 2019-10-04 MED ORDER — MIDAZOLAM HCL 2 MG/2ML IJ SOLN: 0.5000 mg | Freq: Once | INTRAMUSCULAR | Status: DC | PRN

## 2019-10-04 MED ORDER — ALBUTEROL SULFATE (2.5 MG/3ML) 0.083% IN NEBU: 2.5000 mg | INHALATION_SOLUTION | RESPIRATORY_TRACT | Status: DC | PRN

## 2019-10-04 MED ORDER — BUPIVACAINE-EPINEPHRINE (PF) 0.5% -1:200000 IJ SOLN
INTRAMUSCULAR | Status: AC
  Filled 2019-10-04: qty 30

## 2019-10-04 MED ORDER — HYDRALAZINE HCL 25 MG PO TABS: 25.0000 mg | ORAL_TABLET | Freq: Four times a day (QID) | ORAL | Status: DC | PRN

## 2019-10-04 MED ORDER — ROCURONIUM 10MG/ML (10ML) SYRINGE FOR MEDFUSION PUMP - OPTIME
INTRAVENOUS | Status: DC | PRN
  Administered 2019-10-04: 10 mg via INTRAVENOUS
  Administered 2019-10-04: 25 mg via INTRAVENOUS

## 2019-10-04 MED ORDER — 0.9 % SODIUM CHLORIDE (POUR BTL) OPTIME
TOPICAL | Status: DC | PRN
  Administered 2019-10-04: 1000 mL

## 2019-10-04 MED ORDER — SUGAMMADEX SODIUM 200 MG/2ML IV SOLN
INTRAVENOUS | Status: DC | PRN
  Administered 2019-10-04: 130 mg via INTRAVENOUS

## 2019-10-04 MED ORDER — CEFAZOLIN SODIUM-DEXTROSE 2-4 GM/100ML-% IV SOLN
2.0000 g | Freq: Four times a day (QID) | INTRAVENOUS | Status: AC
  Administered 2019-10-04 (×2): 2 g via INTRAVENOUS
  Filled 2019-10-04 (×2): qty 100

## 2019-10-04 MED ORDER — ENSURE ENLIVE PO LIQD
237.0000 mL | Freq: Two times a day (BID) | ORAL | Status: DC
  Administered 2019-10-05 – 2019-10-07 (×5): 237 mL via ORAL

## 2019-10-04 MED ORDER — ONDANSETRON HCL 4 MG PO TABS
4.0000 mg | ORAL_TABLET | Freq: Four times a day (QID) | ORAL | Status: DC | PRN
  Administered 2019-10-05: 4 mg via ORAL
  Filled 2019-10-04 (×3): qty 1

## 2019-10-04 MED ORDER — BUDESONIDE 0.5 MG/2ML IN SUSP
0.5000 mg | Freq: Two times a day (BID) | RESPIRATORY_TRACT | Status: DC
  Administered 2019-10-04 – 2019-10-07 (×8): 0.5 mg via RESPIRATORY_TRACT
  Filled 2019-10-04 (×8): qty 2

## 2019-10-04 MED ORDER — PREDNISONE 10 MG PO TABS
5.0000 mg | ORAL_TABLET | Freq: Every day | ORAL | Status: DC
  Administered 2019-10-05 – 2019-10-08 (×4): 5 mg via ORAL
  Filled 2019-10-04 (×6): qty 1

## 2019-10-04 MED ORDER — METOCLOPRAMIDE HCL 5 MG PO TABS
5.0000 mg | ORAL_TABLET | Freq: Three times a day (TID) | ORAL | Status: DC | PRN
  Filled 2019-10-04: qty 2

## 2019-10-04 MED ORDER — HYDROMORPHONE HCL 1 MG/ML IJ SOLN
INTRAMUSCULAR | Status: AC
  Filled 2019-10-04: qty 0.5

## 2019-10-04 MED ORDER — CHLORHEXIDINE GLUCONATE CLOTH 2 % EX PADS
6.0000 | MEDICATED_PAD | Freq: Every day | CUTANEOUS | Status: DC
  Administered 2019-10-05 – 2019-10-06 (×2): 6 via TOPICAL

## 2019-10-04 MED ORDER — PHENOL 1.4 % MT LIQD
1.0000 | OROMUCOSAL | Status: DC | PRN
  Filled 2019-10-04: qty 177

## 2019-10-04 MED ORDER — FENTANYL CITRATE (PF) 100 MCG/2ML IJ SOLN
INTRAMUSCULAR | Status: DC | PRN
  Administered 2019-10-04 (×2): 25 ug via INTRAVENOUS
  Administered 2019-10-04: 50 ug via INTRAVENOUS
  Administered 2019-10-04 (×2): 25 ug via INTRAVENOUS

## 2019-10-04 MED ORDER — METOPROLOL TARTRATE 5 MG/5ML IV SOLN
5.0000 mg | Freq: Once | INTRAVENOUS | Status: AC
  Administered 2019-10-04: 04:00:00 5 mg via INTRAVENOUS
  Filled 2019-10-04: qty 5

## 2019-10-04 MED ORDER — ONDANSETRON HCL 4 MG/2ML IJ SOLN
INTRAMUSCULAR | Status: DC | PRN
  Administered 2019-10-04: 4 mg via INTRAVENOUS

## 2019-10-04 MED ORDER — METHOCARBAMOL 1000 MG/10ML IJ SOLN
500.0000 mg | Freq: Four times a day (QID) | INTRAVENOUS | Status: DC | PRN
  Filled 2019-10-04: qty 5

## 2019-10-04 MED ORDER — ALBUTEROL SULFATE HFA 108 (90 BASE) MCG/ACT IN AERS: 2.0000 | INHALATION_SPRAY | RESPIRATORY_TRACT | Status: DC | PRN

## 2019-10-04 MED ORDER — HYDROMORPHONE HCL 1 MG/ML IJ SOLN
0.2500 mg | INTRAMUSCULAR | Status: DC | PRN
  Administered 2019-10-04: 0.5 mg via INTRAVENOUS

## 2019-10-04 MED ORDER — ADULT MULTIVITAMIN W/MINERALS CH
1.0000 | ORAL_TABLET | Freq: Every day | ORAL | Status: DC
  Administered 2019-10-05 – 2019-10-08 (×4): 1 via ORAL
  Filled 2019-10-04 (×5): qty 1

## 2019-10-04 MED ORDER — ASPIRIN EC 325 MG PO TBEC
325.0000 mg | DELAYED_RELEASE_TABLET | Freq: Every day | ORAL | Status: DC
  Administered 2019-10-05 – 2019-10-08 (×4): 325 mg via ORAL
  Filled 2019-10-04 (×7): qty 1

## 2019-10-04 MED ORDER — JUVEN PO PACK
1.0000 | PACK | Freq: Two times a day (BID) | ORAL | Status: DC
  Administered 2019-10-05 – 2019-10-08 (×8): 1 via ORAL
  Filled 2019-10-04 (×8): qty 1

## 2019-10-04 MED ORDER — SODIUM CHLORIDE 0.9 % IV SOLN: INTRAVENOUS | Status: DC

## 2019-10-04 MED ORDER — MENTHOL 3 MG MT LOZG
1.0000 | LOZENGE | OROMUCOSAL | Status: DC | PRN
  Filled 2019-10-04: qty 9

## 2019-10-04 MED ORDER — BISACODYL 10 MG RE SUPP: 10.0000 mg | Freq: Every day | RECTAL | Status: DC | PRN

## 2019-10-04 MED ORDER — METOCLOPRAMIDE HCL 5 MG/ML IJ SOLN: 5.0000 mg | Freq: Three times a day (TID) | INTRAMUSCULAR | Status: DC | PRN

## 2019-10-04 MED ORDER — LIDOCAINE 2% (20 MG/ML) 5 ML SYRINGE
INTRAMUSCULAR | Status: AC
  Filled 2019-10-04: qty 5

## 2019-10-04 MED ORDER — PANTOPRAZOLE SODIUM 40 MG PO TBEC
40.0000 mg | DELAYED_RELEASE_TABLET | Freq: Every day | ORAL | Status: DC
  Administered 2019-10-05 – 2019-10-08 (×4): 40 mg via ORAL
  Filled 2019-10-04 (×5): qty 1

## 2019-10-04 MED ORDER — LEVOTHYROXINE SODIUM 25 MCG PO TABS
125.0000 ug | ORAL_TABLET | Freq: Every morning | ORAL | Status: DC
  Administered 2019-10-05 – 2019-10-08 (×4): 125 ug via ORAL
  Filled 2019-10-04 (×6): qty 1

## 2019-10-04 MED ORDER — LORATADINE 10 MG PO TABS
10.0000 mg | ORAL_TABLET | Freq: Every day | ORAL | Status: DC
  Administered 2019-10-05 – 2019-10-08 (×4): 10 mg via ORAL
  Filled 2019-10-04 (×6): qty 1

## 2019-10-04 MED ORDER — POVIDONE-IODINE 10 % EX SWAB: 2.0000 "application " | Freq: Once | CUTANEOUS | Status: DC

## 2019-10-04 MED ORDER — FUROSEMIDE 40 MG PO TABS
40.0000 mg | ORAL_TABLET | Freq: Every day | ORAL | Status: DC
  Administered 2019-10-05 – 2019-10-08 (×4): 40 mg via ORAL
  Filled 2019-10-04 (×6): qty 1

## 2019-10-04 MED ORDER — BUPIVACAINE LIPOSOME 1.3 % IJ SUSP
INTRAMUSCULAR | Status: AC
  Filled 2019-10-04: qty 20

## 2019-10-04 MED ORDER — MORPHINE SULFATE (PF) 2 MG/ML IV SOLN
0.5000 mg | INTRAVENOUS | Status: DC | PRN
  Administered 2019-10-04 – 2019-10-06 (×7): 0.5 mg via INTRAVENOUS
  Filled 2019-10-04 (×8): qty 1

## 2019-10-04 MED ORDER — CEFAZOLIN SODIUM-DEXTROSE 2-4 GM/100ML-% IV SOLN
2.0000 g | INTRAVENOUS | Status: AC
  Administered 2019-10-04: 14:00:00 2 g via INTRAVENOUS

## 2019-10-04 MED ORDER — METHOCARBAMOL 500 MG PO TABS
500.0000 mg | ORAL_TABLET | Freq: Four times a day (QID) | ORAL | Status: DC | PRN
  Administered 2019-10-07: 500 mg via ORAL
  Filled 2019-10-04: qty 1

## 2019-10-04 MED ORDER — IPRATROPIUM-ALBUTEROL 0.5-2.5 (3) MG/3ML IN SOLN: 3.0000 mL | Freq: Four times a day (QID) | RESPIRATORY_TRACT | Status: DC | PRN

## 2019-10-04 SURGICAL SUPPLY — 64 items
APL PRP STRL LF DISP 70% ISPRP (MISCELLANEOUS) ×1
BIPOLAR PROS AML 46 (Hips) ×2 IMPLANT
BIPOLAR PROS AML 46MM (Hips) ×1 IMPLANT
BIT DRILL 2.8X128 (BIT) ×2 IMPLANT
BIT DRILL 2.8X128MM (BIT) ×1
BLADE HEX COATED 2.75 (ELECTRODE) ×3 IMPLANT
BLADE SAGITTAL 25.0X1.27X90 (BLADE) ×2 IMPLANT
BLADE SAGITTAL 25.0X1.27X90MM (BLADE) ×1
BLADE SURG SZ10 CARB STEEL (BLADE) ×3 IMPLANT
BRUSH FEMORAL CANAL (MISCELLANEOUS) IMPLANT
CHLORAPREP W/TINT 26 (MISCELLANEOUS) ×3 IMPLANT
CLOTH BEACON ORANGE TIMEOUT ST (SAFETY) ×3 IMPLANT
COVER LIGHT HANDLE STERIS (MISCELLANEOUS) ×6 IMPLANT
COVER WAND RF STERILE (DRAPES) ×3 IMPLANT
DECANTER SPIKE VIAL GLASS SM (MISCELLANEOUS) ×3 IMPLANT
DRAPE HIP W/POCKET STRL (MISCELLANEOUS) ×3 IMPLANT
DRAPE U-SHAPE 47X51 STRL (DRAPES) ×3 IMPLANT
DRSG MEPILEX BORDER 4X12 (GAUZE/BANDAGES/DRESSINGS) ×3 IMPLANT
ELECT REM PT RETURN 9FT ADLT (ELECTROSURGICAL) ×3
ELECTRODE REM PT RTRN 9FT ADLT (ELECTROSURGICAL) ×1 IMPLANT
GLOVE BIOGEL PI IND STRL 7.0 (GLOVE) ×3 IMPLANT
GLOVE BIOGEL PI INDICATOR 7.0 (GLOVE) ×6
GLOVE SKINSENSE NS SZ8.0 LF (GLOVE) ×4
GLOVE SKINSENSE STRL SZ8.0 LF (GLOVE) ×2 IMPLANT
GLOVE SS N UNI LF 8.5 STRL (GLOVE) ×3 IMPLANT
GOWN STRL REUS W/TWL LRG LVL3 (GOWN DISPOSABLE) ×8 IMPLANT
GOWN STRL REUS W/TWL XL LVL3 (GOWN DISPOSABLE) ×3 IMPLANT
HANDPIECE INTERPULSE COAX TIP (DISPOSABLE) ×3
HEAD BIPOLAR PROS AML 46 (Hips) IMPLANT
HEAD FEM STD 28X+1.5 STRL (Hips) ×2 IMPLANT
INST SET MAJOR BONE (KITS) ×3 IMPLANT
KIT BLADEGUARD II DBL (SET/KITS/TRAYS/PACK) ×3 IMPLANT
KIT TURNOVER KIT A (KITS) ×3 IMPLANT
MANIFOLD NEPTUNE II (INSTRUMENTS) ×3 IMPLANT
MARKER SKIN DUAL TIP RULER LAB (MISCELLANEOUS) ×3 IMPLANT
NDL HYPO 18GX1.5 BLUNT FILL (NEEDLE) ×1 IMPLANT
NDL HYPO 21X1.5 SAFETY (NEEDLE) ×1 IMPLANT
NEEDLE HYPO 18GX1.5 BLUNT FILL (NEEDLE) ×3 IMPLANT
NEEDLE HYPO 21X1.5 SAFETY (NEEDLE) ×3 IMPLANT
NS IRRIG 1000ML POUR BTL (IV SOLUTION) ×3 IMPLANT
PACK TOTAL JOINT (CUSTOM PROCEDURE TRAY) ×3 IMPLANT
PAD ARMBOARD 7.5X6 YLW CONV (MISCELLANEOUS) ×3 IMPLANT
PASSER SUT SWANSON 36MM LOOP (INSTRUMENTS) IMPLANT
PENCIL SMOKE EVACUATOR (MISCELLANEOUS) ×2 IMPLANT
PIN STMN SNGL STERILE 9X3.6MM (PIN) ×6 IMPLANT
SET BASIN LINEN APH (SET/KITS/TRAYS/PACK) ×3 IMPLANT
SET HNDPC FAN SPRY TIP SCT (DISPOSABLE) ×1 IMPLANT
STAPLER VISISTAT 35W (STAPLE) ×3 IMPLANT
STEM SUMMIT BASIC PRESSFIT SZ3 (Hips) IMPLANT
SUMMIT BASIC PRESSFIT SZ3 (Hips) ×3 IMPLANT
SUT BRALON NAB BRD #1 30IN (SUTURE) ×8 IMPLANT
SUT ETHIBOND 5 LR DA (SUTURE) ×6 IMPLANT
SUT MNCRL 0 VIOLET CTX 36 (SUTURE) ×1 IMPLANT
SUT MON AB 2-0 CT1 36 (SUTURE) ×3 IMPLANT
SUT MONOCRYL 0 CTX 36 (SUTURE) ×2
SUT VIC AB 1 CT1 27 (SUTURE) ×15
SUT VIC AB 1 CT1 27XBRD ANTBC (SUTURE) ×2 IMPLANT
SYR 20ML LL LF (SYRINGE) ×6 IMPLANT
SYR 30ML LL (SYRINGE) ×3 IMPLANT
SYR BULB IRRIGATION 50ML (SYRINGE) ×3 IMPLANT
TOWER CARTRIDGE SMART MIX (DISPOSABLE) IMPLANT
TRAY FOLEY MTR SLVR 16FR STAT (SET/KITS/TRAYS/PACK) ×3 IMPLANT
WATER STERILE IRR 1000ML POUR (IV SOLUTION) ×6 IMPLANT
YANKAUER SUCT 12FT TUBE ARGYLE (SUCTIONS) ×3 IMPLANT

## 2019-10-04 NOTE — Progress Notes (Signed)
Patient still too drowsy to instruct on the use of IS. Dayshift RT will visit in the morning to instruct patient on IS. SpO2 on 2L [place] 100%

## 2019-10-04 NOTE — Care Management Important Message (Signed)
Important Message  Patient Details  Name: Lindsey Butler MRN: [id number] Date of Birth: 01/09/1929   Medicare Important Message Given:  Yes     [name] 10/04/2019, 2:31 PM

## 2019-10-04 NOTE — Consult Note (Addendum)
Ortho care [place]  In hospital consultation  Dr. Heath Lark requesting consultation  Chief Complaint  Patient presents with  . Hip Pain    left    86 year old female mechanical fall Left hip pain Pain is nonradiating Located over the left proximal thigh and left hip Pain is severe Pain is sharp Pain is associated with increased pain with movement Patient is unable to ambulate  Review of Systems  Constitutional: Negative for chills, fever and weight loss.  Respiratory: Negative for shortness of breath.   Cardiovascular: Negative for chest pain.  Musculoskeletal: Positive for back pain and falls.  Skin:       Bouts of cellulitis tenuous skin easy bruising  Neurological: Positive for weakness.  Endo/Heme/Allergies: Bruises/bleeds easily.  All other systems reviewed and are negative.  Past Medical History:  Diagnosis Date  . Actinic keratosis   . Asthma   . GERD (gastroesophageal reflux disease)   . Hyperlipidemia   . Hypothyroidism   . Osteoarthritis    She has a left total knee She also had lumbar spine surgery  Family History  Problem Relation Age of Onset  . Cancer Son   . Liver cancer Brother    Social History   Tobacco Use  . Smoking status: Never Smoker  . Smokeless tobacco: Never Used  Substance Use Topics  . Alcohol use: No  . Drug use: No   BP 139/71   Pulse 93   Temp 98.3 F (36.8 C) (Oral)   Resp 20   Ht 5\' 6"  (1.676 m)   Wt 62.8 kg   SpO2 95%   BMI 22.35 kg/m   She has a small thin frail appearing 86 year old female who is in moderate to severe pain lying supine in the bed She is awake alert and oriented x3 mood and affect are normal and flat respectively Gait unable to walk  Right and left upper extremity I find no malalignment contracture subluxation atrophy or tremor again the skin is very tenuous various areas of subcutaneous bruising but no tenderness  Right lower extremity no tenderness normal range of motion to passive  testing no instability of the hip knee or ankle muscle tone is normal skin again shows darkening of the proximal to mid tibia distally towards the leg with edema  All 4 extremities have normal pulse and perfusion normal color and capillary refill  Neurologically reflexes are normal in the upper extremities and right lower extremity sensation is normal in all 4 extremities coordination upper extremities normal lower extremities could not be tested balance could not be tested  Assessment and plan and medical decision making  CBC Latest Ref Rng & Units 10/04/2019 10/03/2019 10/05/2016  WBC 4.0 - 10.5 K/uL 12.2(H) 11.9(H) 9.6  Hemoglobin 12.0 - 15.0 g/dL 12.7 12.3 12.6  Hematocrit 36.0 - 46.0 % 38.7 38.8 39.1  Platelets 150 - 400 K/uL 381 361 439(H)    BMP Latest Ref Rng & Units 10/04/2019 10/03/2019 06/01/2010  Glucose 70 - 99 mg/dL 131(H) 117(H) 104(H)  BUN 8 - 23 mg/dL 14 16 -  Creatinine 0.44 - 1.00 mg/dL 0.54 0.60 -  Sodium 135 - 145 mmol/L 135 135 139  Potassium 3.5 - 5.1 mmol/L 3.6 3.6 3.6  Chloride 98 - 111 mmol/L 104 106 -  CO2 22 - 32 mmol/L 21(L) 21(L) -  Calcium 8.9 - 10.3 mg/dL 9.5 9.6 -    Images assessed: Were read as a femoral neck impacted fracture however this is a completely displaced left  femoral neck fracture.    CLINICAL DATA:  Injury.   EXAM: CHEST  1 VIEW   COMPARISON:  06/01/2019   FINDINGS: The heart size is enlarged. There is a large hiatal hernia. There is no pneumothorax. No large pleural effusion. There may be some scarring versus atelectasis at the lung bases. Advanced degenerative changes are noted of both glenohumeral joints, right worse than left. Aortic calcifications are noted.   IMPRESSION: Cardiomegaly with large hiatal hernia. No evidence for acute disease or significant interval change from prior.     Electronically Signed   By: Constance Holster M.D.   On: 10/03/2019 21:02   Diagnosis left femoral neck fracture completely  displaced  Plan partial hip replacement left hip

## 2019-10-04 NOTE — Progress Notes (Signed)
PROGRESS NOTE    Lindsey Butler  [id number] DOB: Jan 08, 1929 DOA: 10/03/2019 PCP: Neale Burly, MD   Brief Narrative:  Per HPI: Lindsey Butler is a 86 y.o. female with medical history significant of hypertension, hypothyroidism, depression, GERD, asthma who presented to the ER with a fall at home with left hip pain.  Patient had a mechanical fall at home around 1:30 PM when she was walking down the hall and says her left knee gave way it caused her to fall to the side.  No head injury, no loss of consciousness.  Unable to get up without assistance.  Found to have a left femoral neck fracture on admission.  Case discussed with orthopedic surgeon on call will plan for intervention in a.m.  2/26: Plans for partial left hip replacement today per orthopedics.  Pain appears to be well controlled.  Assessment & Plan:   Principal Problem:   Fracture of femoral neck, left (HCC) Active Problems:   Closed left hip fracture (HCC)   Left impacted femoral neck fracture -Status post mechanical fall -Continue n.p.o. status with orthopedic surgery for partial left hip replacement this afternoon -Pain management as ordered  Hypertension-controlled -Continue losartan  Hypothyroidism -Continue levothyroxine  Depression -Continue sertraline  GERD -PPI  Asthma -No acute exacerbation currently noted -Continue Pulmicort with DuoNebs as needed   DVT prophylaxis: Lovenox Code Status: Full Family Communication: Will call daughter-in-law on phone Disposition Plan: Partial left hip replacement today per orthopedics   Consultants:   Orthopedics  Procedures:   See below  Antimicrobials:  Anti-infectives (From admission, onward)   Start     Dose/Rate Route Frequency Ordered Stop   10/04/19 0930  ceFAZolin (ANCEF) IVPB 2g/100 mL premix     2 g 200 mL/hr over 30 Minutes Intravenous On call to O.R. 10/04/19 [id number] 10/05/19 0559       Subjective: Patient seen and evaluated today  with no new acute complaints or concerns. No acute concerns or events noted overnight.  She denies any significant pain currently.  Objective: Vitals:   10/04/19 0516 10/04/19 0518 10/04/19 0811 10/04/19 0940  BP: (!) 173/96 (!) 173/96 139/71   Pulse: 94 92 93   Resp: 20 20    Temp: 98.3 F (36.8 C) 98.3 F (36.8 C)    TempSrc: Oral Oral    SpO2: 100% 98% 95% 92%  Weight:      Height:        Intake/Output Summary (Last 24 hours) at 10/04/2019 1118 Last data filed at 10/04/2019 0600 Gross per 24 hour  Intake 0 ml  Output 650 ml  Net -650 ml   Filed Weights   10/03/19 1827 10/04/19 0101  Weight: 66.7 kg 62.8 kg    Examination:  General exam: Appears calm and comfortable  Respiratory system: Clear to auscultation. Respiratory effort normal. Cardiovascular system: S1 & S2 heard, RRR. No JVD, murmurs, rubs, gallops or clicks. No pedal edema. Gastrointestinal system: Abdomen is nondistended, soft and nontender. No organomegaly or masses felt. Normal bowel sounds heard. Central nervous system: Alert and oriented. No focal neurological deficits. Extremities: Symmetric 5 x 5 power. Skin: No rashes, lesions or ulcers Psychiatry: Judgement and insight appear normal. Mood & affect appropriate.     Data Reviewed: I have personally reviewed following labs and imaging studies  CBC: Recent Labs  Lab 10/03/19 2055 10/04/19 0439  WBC 11.9* 12.2*  NEUTROABS 9.9*  --   HGB 12.3 12.7  HCT 38.8 38.7  MCV 99.2  97.0  PLT 361 [id number]   Basic Metabolic Panel: Recent Labs  Lab 10/03/19 2055 10/04/19 0439  NA 135 135  K 3.6 3.6  CL 106 104  CO2 21* 21*  GLUCOSE 117* 131*  BUN 16 14  CREATININE 0.60 0.54  CALCIUM 9.6 9.5   GFR: Estimated Creatinine Clearance: 45.5 mL/min (by C-G formula based on SCr of 0.54 mg/dL). Liver Function Tests: Recent Labs  Lab 10/03/19 2055  AST 19  ALT 19  ALKPHOS 56  BILITOT 0.6  PROT 6.6  ALBUMIN 3.7   No results for input(s): LIPASE,  AMYLASE in the last 168 hours. No results for input(s): AMMONIA in the last 168 hours. Coagulation Profile: No results for input(s): INR, PROTIME in the last 168 hours. Cardiac Enzymes: No results for input(s): CKTOTAL, CKMB, CKMBINDEX, TROPONINI in the last 168 hours. BNP (last 3 results) No results for input(s): PROBNP in the last 8760 hours. HbA1C: No results for input(s): HGBA1C in the last 72 hours. CBG: No results for input(s): GLUCAP in the last 168 hours. Lipid Profile: No results for input(s): CHOL, HDL, LDLCALC, TRIG, CHOLHDL, LDLDIRECT in the last 72 hours. Thyroid Function Tests: No results for input(s): TSH, T4TOTAL, FREET4, T3FREE, THYROIDAB in the last 72 hours. Anemia Panel: No results for input(s): VITAMINB12, FOLATE, FERRITIN, TIBC, IRON, RETICCTPCT in the last 72 hours. Sepsis Labs: No results for input(s): PROCALCITON, LATICACIDVEN in the last 168 hours.  Recent Results (from the past 240 hour(s))  Respiratory Panel by RT PCR (Flu A&B, Covid) - Nasopharyngeal Swab     Status: None   Collection Time: 10/03/19 10:23 PM   Specimen: Nasopharyngeal Swab  Result Value Ref Range Status   SARS Coronavirus 2 by RT PCR NEGATIVE NEGATIVE Final    Comment: (NOTE) SARS-CoV-2 target nucleic acids are NOT DETECTED. The SARS-CoV-2 RNA is generally detectable in upper respiratoy specimens during the acute phase of infection. The lowest concentration of SARS-CoV-2 viral copies this assay can detect is 131 copies/mL. A negative result does not preclude SARS-Cov-2 infection and should not be used as the sole basis for treatment or other patient management decisions. A negative result may occur with  improper specimen collection/handling, submission of specimen other than nasopharyngeal swab, presence of viral mutation(s) within the areas targeted by this assay, and inadequate number of viral copies (<131 copies/mL). A negative result must be combined with clinical observations,  patient history, and epidemiological information. The expected result is Negative. Fact Sheet for Patients:  PinkCheek.be Fact Sheet for Healthcare Providers:  GravelBags.it This test is not yet ap proved or cleared by the [place] FDA and  has been authorized for detection and/or diagnosis of SARS-CoV-2 by FDA under an Emergency Use Authorization (EUA). This EUA will remain  in effect (meaning this test can be used) for the duration of the COVID-19 declaration under Section 564(b)(1) of the Act, 21 U.S.C. section 360bbb-3(b)(1), unless the authorization is terminated or revoked sooner.    Influenza A by PCR NEGATIVE NEGATIVE Final   Influenza B by PCR NEGATIVE NEGATIVE Final    Comment: (NOTE) The Xpert Xpress SARS-CoV-2/FLU/RSV assay is intended as an aid in  the diagnosis of influenza from Nasopharyngeal swab specimens and  should not be used as a sole basis for treatment. Nasal washings and  aspirates are unacceptable for Xpert Xpress SARS-CoV-2/FLU/RSV  testing. Fact Sheet for Patients: PinkCheek.be Fact Sheet for Healthcare Providers: GravelBags.it This test is not yet approved or cleared by the [place] FDA  and  has been authorized for detection and/or diagnosis of SARS-CoV-2 by  FDA under an Emergency Use Authorization (EUA). This EUA will remain  in effect (meaning this test can be used) for the duration of the  Covid-19 declaration under Section 564(b)(1) of the Act, 21  U.S.C. section 360bbb-3(b)(1), unless the authorization is  terminated or revoked. Performed at [facility], [address] [place], [place] [address]   Surgical PCR screen     Status: None   Collection Time: 10/04/19  1:23 AM   Specimen: Nasal Mucosa; Nasal Swab  Result Value Ref Range Status   MRSA, PCR NEGATIVE NEGATIVE Final   Staphylococcus aureus NEGATIVE NEGATIVE Final      Comment: (NOTE) The Xpert SA Assay (FDA approved for NASAL specimens in patients 32 years of age and older), is one component of a comprehensive surveillance program. It is not intended to diagnose infection nor to guide or monitor treatment. Performed at [facility], [address] [place], [place] [address]          Radiology Studies: DG Chest 1 View  Result Date: 10/03/2019 CLINICAL DATA:  Injury. EXAM: CHEST  1 VIEW COMPARISON:  06/01/2019 FINDINGS: The heart size is enlarged. There is a large hiatal hernia. There is no pneumothorax. No large pleural effusion. There may be some scarring versus atelectasis at the lung bases. Advanced degenerative changes are noted of both glenohumeral joints, right worse than left. Aortic calcifications are noted. IMPRESSION: Cardiomegaly with large hiatal hernia. No evidence for acute disease or significant interval change from prior. Electronically Signed   By: Constance Holster M.D.   On: 10/03/2019 21:02   DG Knee 1-2 Views Left  Result Date: 10/03/2019 CLINICAL DATA:  Pain EXAM: LEFT KNEE - 1-2 VIEW COMPARISON:  February 16, 2019. FINDINGS: The patient is status post total knee arthroplasty on the left. The hardware appears to be grossly intact. There may be a trace suprapatellar joint effusion. There is no evidence for area a periprosthetic fracture. IMPRESSION: No acute displaced fracture. Electronically Signed   By: Constance Holster M.D.   On: 10/03/2019 21:04   DG Hip Unilat W or Wo Pelvis 2-3 Views Left  Result Date: 10/03/2019 CLINICAL DATA:  [place], left hip pain EXAM: DG HIP (WITH OR WITHOUT PELVIS) 2-3V LEFT COMPARISON:  05/28/2019 FINDINGS: Frontal view of the pelvis as well as frontal and cross-table lateral views of the left hip are obtained. There is a subcapital left femoral neck fracture with proximal migration of the femur. Femoral head articulates normally with the left acetabulum. Bilateral hip osteoarthritis unchanged. The  remainder of the bony pelvis is stable. Postsurgical changes mid lumbar spine. IMPRESSION: 1. Impacted subcapital left femoral neck fracture. Electronically Signed   By: Randa Ngo M.D.   On: 10/03/2019 20:55   DG Femur Min 2 Views Left  Result Date: 10/03/2019 CLINICAL DATA:  [place], left hip pain EXAM: LEFT FEMUR 2 VIEWS COMPARISON:  05/28/2019 FINDINGS: Frontal and lateral views of the left femur demonstrates an impacted subcapital femoral neck fracture, with proximal migration of the femur relative to the femoral head. No dislocation. Left knee arthroplasty appears well aligned. Soft tissues are unremarkable. IMPRESSION: 1. Impacted subcapital left femoral neck fracture. Electronically Signed   By: Randa Ngo M.D.   On: 10/03/2019 20:56        Scheduled Meds: . sodium chloride   Intravenous Once  . acetaZOLAMIDE  250 mg Oral BID  . budesonide  0.5 mg Nebulization  BID  . docusate sodium  100 mg Oral BID  . enoxaparin (LOVENOX) injection  40 mg Subcutaneous Q24H  . furosemide  40 mg Oral Daily  . levothyroxine  125 mcg Oral q morning - 10a  . loratadine  10 mg Oral Daily  . losartan  50 mg Oral Daily  . montelukast  10 mg Oral QHS  . pantoprazole  40 mg Oral Daily  . povidone-iodine  2 application Topical Once  . predniSONE  5 mg Oral Daily  . sertraline  25 mg Oral Daily   Continuous Infusions: .  ceFAZolin (ANCEF) IV       LOS: 0 days    Time spent: 30 minutes    Maigan Bittinger Darleen Crocker, DO [facility] Pager [phone]  If 7PM-7AM, please contact night-coverage [email or web address] Password [email or web address] 10/04/2019, 11:18 AM

## 2019-10-04 NOTE — Transfer of Care (Signed)
Immediate Anesthesia Transfer of Care Note  Patient: Lindsey Butler  Procedure(s) Performed: ARTHROPLASTY BIPOLAR HIP (HEMIARTHROPLASTY) (Left Hip)  Patient Location: PACU  Anesthesia Type:General  Level of Consciousness: awake, oriented and patient cooperative  Airway & Oxygen Therapy: Patient Spontanous Breathing and Patient connected to nasal cannula oxygen  Post-op Assessment: Report given to RN and Post -op Vital signs reviewed and stable  Post vital signs: Reviewed and stable  Last Vitals:  Vitals Value Taken Time  BP    Temp    Pulse 92 10/04/19 1556  Resp 30 10/04/19 1556  SpO2 100 % 10/04/19 1556  Vitals shown include unvalidated device data.  Last Pain:  Vitals:   10/04/19 1320  TempSrc:   PainSc: 4       Patients Stated Pain Goal: 6 ([id number])  Complications: No apparent anesthesia complications

## 2019-10-04 NOTE — ED Notes (Signed)
CALL FAMILY IN MORNING WITH ROOM NUMBER

## 2019-10-04 NOTE — Interval H&P Note (Signed)
History and Physical Interval Note:  10/04/2019 1:31 PM  Lindsey Butler  has presented today for surgery, with the diagnosis of Left femoral neck fracture.  The various methods of treatment have been discussed with the patient and family. After consideration of risks, benefits and other options for treatment, the patient has consented to  Procedure(s): ARTHROPLASTY BIPOLAR HIP (HEMIARTHROPLASTY) (Left) as a surgical intervention.  The patient's history has been reviewed, patient examined, no change in status, stable for surgery.  I have reviewed the patient's chart and labs.  Questions were answered to the patient's satisfaction.     Arther Abbott

## 2019-10-04 NOTE — Op Note (Addendum)
10/04/2019  3:48 PM  PATIENT:  Lindsey Butler  86 y.o. female  PRE-OPERATIVE DIAGNOSIS:  Left femoral neck fracture  POST-OPERATIVE DIAGNOSIS:  Left femoral neck fracture  PROCEDURE:  Procedure(s): ARTHROPLASTY BIPOLAR HIP (HEMIARTHROPLASTY) (Left)  SURGEON:  Surgeon(s) and Role:    Carole Civil, MD - Primary  PHYSICIAN ASSISTANT:   ASSISTANTS: Corrie Dandy  ANESTHESIA:   General anesthesia   Estimated blood loss 100 cc  Local medications used Exparel 20 cc and Marcaine with epinephrine 20 cc  No drains  Disposition of specimens none counts were correct  DICTATION: .Dragon Dictation  PLAN OF CARE: Admit to inpatient   PATIENT DISPOSITION:  PACU - hemodynamically stable.   Delay start of Pharmacological VTE agent (>24hrs) due to surgical blood loss or risk of bleeding: not applicable  Details of surgery.  Mr. Mcneary was seen in preop the surgical site was conformed the chart was reviewed and all forms that need signing were signed  She was taken to the operating room we started her Ancef within an hour of the skin incision and she had general anesthesia.  She was placed in the lateral decubitus position right side down left side up and a hip positioner was used to hold her at the ASIS and lower lumbar spine  After sterile prep and drape timeout was completed.  I checked the implant sizes and x-rays.  Once all were in agreement I made a lateral incision for direct lateral approach to the hip  Subcutaneous tissue was divided fascia was divided and then a large fluid collection from a prior bursitis was excised and it was noted that the gluteus medius musculature had already split from the greater trochanter.  Therefore I suddenly change the approach to include continuing to split proximally and distally with the myofascial sleeve of vastus lateralis cauterizing the anterior femoral circumflex branch which was bleeding.  I then did a capsulotomy and preserve the  capsule by tagging each limb of the T shaped incision.  The femoral head was encountered the hematoma was evacuated the head was measured to be 46 mm the fracture was oblique.  The acetabulum had very minimal degenerative changes mainly in the weightbearing area.  A Ralynn San sweep maneuver was used to clean out the acetabulum and the hip was dislocated anteriorly  A box osteotome was used to open the proximal canal followed by a starter hole reamer followed by canal finder and then trochanteric reamer.  The DePuy proximal femoral cutting guide was used referencing the greater trochanter and a proximal femoral cut was made.  After further excising the capsule and identifying the lesser trochanter further femoral neck cut was made.  We broached up to a size 3 and then did a trial reduction with a 46 had a 1.5 neck and a 3 press-fit stem  Reduction was excellent hip was stable knee flexed past 90degrees with the hip in neutral flexion extension and we were able to externally rotate the hip with 5 degrees of extension and no dislocation and then we flexed the hip and internally rotated 60 degrees without dislocation and the bed test was normal.  Leg lengths were restored to normal  The hip was irrigated 2 drill holes were made in the greater trochanter passed a #5 suture through those 2 drill holes and then the implant was placed.  The hip was reduced the reduction maneuvers and range of motion tests were repeated and were stable  We injected the soft tissues  of the hip with exparel and then repaired the capsule with #1 Braylon and then repaired the vastus lateralis gluteus sleeve with interrupted and running Braylon suture.  We closed the fascia with #1 Braylon suture closed the subcutaneous tissue with 0 Monocryl in interrupted and running fashion and then reapproximated the skin with staples injecting it with 20 cc of Marcaine with epinephrine  Postoperative plan  DVT prevention for 35  days  Weightbearing status as tolerated  Direct lateral hip precautions  Staples out in 2 weeks  Follow-up at 4 weeks.  Implants  DePuy press-fit stem [place] basic 3, 46 bipolar head, 1.5 neck.  Approach direct lateral  Operative findings oblique fracture of the femoral neck and a split in the gluteus musculature from the greater trochanter

## 2019-10-04 NOTE — Progress Notes (Signed)
Initial Nutrition Assessment  DOCUMENTATION CODES:   Not applicable  INTERVENTION:  -Ensure Enlive po BID, each supplement provides 350 kcal and 20 grams of protein  -1 packet Juven BID, each packet provides 95 calories, 2.5 grams of protein (collagen), and 9.8 grams of carbohydrate (3 grams sugar); also contains 7 grams of L-arginine and L-glutamine, 300 mg vitamin C, 15 mg vitamin E, 1.2 mcg vitamin B-12, 9.5 mg zinc, 200 mg calcium, and 1.5 g  Calcium Beta-hydroxy-Beta-methylbutyrate to support wound healing  MVI with minerals daily  NUTRITION DIAGNOSIS:   Increased nutrient needs related to post-op healing, hip fracture as evidenced by estimated needs.    GOAL:   Patient will meet greater than or equal to 90% of their needs   MONITOR:   Labs, Skin, Supplement acceptance, PO intake, Weight trends, I & O's  REASON FOR ASSESSMENT:   Consult Hip fracture protocol  ASSESSMENT:  RD working remotely.  86 year old female with past medical history significant of HLD, GERD, osteoarthritis, hypothyroidism presented for evaluation of left hip pain after mechanical fall at home. Patient found to have left femoral neck fracture.  Patient presented to surgery 2/26 for left hemiarthroplasty.  RD unable to obtain nutrition history at this time, patient has just returned to room from procedure. Diet has been advanced to regular. Will continue to monitor for po intake and provide Ensure supplement to aid with estimated needs and Juven to support post-op wound healing.   Mild pitting BLE edema noted per RN assessment.  Current wt 138.16 lbs No recent wt history available for review. Per care everywhere, pt 72.8 kg (160.16 lbs) on 08/16/18. Patient has lost 22 lbs (13.7%) in the past 13 months which is insignificant for time frame.   NUTRITION - FOCUSED PHYSICAL EXAM: Unable to complete at this time, RD working remotely.   Diet Order:   Diet Order            Diet regular Room  service appropriate? Yes; Fluid consistency: Thin  Diet effective now              EDUCATION NEEDS:   No education needs have been identified at this time  Skin:  Skin Assessment: Skin Integrity Issues: Skin Integrity Issues:: Incisions Incisions: closed; L hip  Last BM:  2/25  Height:   Ht Readings from Last 1 Encounters:  10/04/19 5\' 6"  (1.676 m)    Weight:   Wt Readings from Last 1 Encounters:  10/04/19 62.8 kg    BMI:  Body mass index is 22.35 kg/m.  Estimated Nutritional Needs:   Kcal:  1600-1800  Protein:  80-90  Fluid:  >/= 1.5 L   Lajuan Lines, RD, LDN Clinical Nutrition After Hours/Weekend Pager # in [place]

## 2019-10-04 NOTE — Brief Op Note (Signed)
10/03/2019 - 10/04/2019  3:48 PM  PATIENT:  Lindsey Butler  86 y.o. female  PRE-OPERATIVE DIAGNOSIS:  Left femoral neck fracture  POST-OPERATIVE DIAGNOSIS:  Left femoral neck fracture  PROCEDURE:  Procedure(s): ARTHROPLASTY BIPOLAR HIP (HEMIARTHROPLASTY) (Left)  SURGEON:  Surgeon(s) and Role:    Lindsey Civil, MD - Primary  PHYSICIAN ASSISTANT:   ASSISTANTS: Lindsey Butler  ANESTHESIA:   General anesthesia   Estimated blood loss 100 cc  Local medications used Exparel 20 cc and Marcaine with epinephrine 20 cc  No drains  Disposition of specimens none counts were correct  DICTATION: .Dragon Dictation  PLAN OF CARE: Admit to inpatient   PATIENT DISPOSITION:  PACU - hemodynamically stable.   Delay start of Pharmacological VTE agent (>24hrs) due to surgical blood loss or risk of bleeding: not applicable  Details of surgery.  [name] was seen in preop the surgical site was conformed the chart was reviewed and all forms that need signing were signed  She was taken to the operating room we started her Ancef within an hour of the skin incision and she had general anesthesia.  She was placed in the lateral decubitus position right side down left side up and a hip positioner was used to hold her at the ASIS and lower lumbar spine  After sterile prep and drape timeout was completed.  I checked the implant sizes and x-rays.  Once all were in agreement I made a lateral incision for direct lateral approach to the hip  Subcutaneous tissue was divided fascia was divided and then a large fluid collection from a prior bursitis was excised and it was noted that the gluteus medius musculature had already split from the greater trochanter.  Therefore I suddenly change the approach to include continuing to split proximally and distally with the myofascial sleeve of vastus lateralis cauterizing the anterior femoral circumflex branch which was bleeding.  I then did a capsulotomy and  preserve the capsule by tagging each limb of the T shaped incision.  The femoral head was encountered the hematoma was evacuated the head was measured to be 46 mm the fracture was oblique.  The acetabulum had very minimal degenerative changes mainly in the weightbearing area.  A Lindsey Butler sweep maneuver was used to clean out the acetabulum and the hip was dislocated anteriorly  A box osteotome was used to open the proximal canal followed by a starter hole reamer followed by canal finder and then trochanteric reamer.  The DePuy proximal femoral cutting guide was used referencing the greater trochanter and a proximal femoral cut was made.  After further excising the capsule and identifying the lesser trochanter further femoral neck cut was made.  We broached up to a size 3 and then did a trial reduction with a 46 had a 1.5 neck and a 3 press-fit stem  Reduction was excellent hip was stable knee flexed past 90degrees with the hip in neutral flexion extension and we were able to externally rotate the hip with 5 degrees of extension and no dislocation and then we flexed the hip and internally rotated 60 degrees without dislocation and the bed test was normal.  Leg lengths were restored to normal  The hip was irrigated 2 drill holes were made in the greater trochanter passed a #5 suture through those 2 drill holes and then the implant was placed.  The hip was reduced the reduction maneuvers and range of motion tests were repeated and were stable  We injected the  soft tissues of the hip with exparel and then repaired the capsule with #1 Lindsey Butler and then repaired the vastus lateralis gluteus sleeve with interrupted and running Lindsey Butler suture.  We closed the fascia with #1 Lindsey Butler suture closed the subcutaneous tissue with 0 Monocryl in interrupted and running fashion and then reapproximated the skin with staples injecting it with 20 cc of Marcaine with epinephrine   Implants  DePuy press-fit stem [place]  basic 3, 46 bipolar head, 1.5 neck.  Approach direct lateral  Operative findings oblique fracture of the femoral neck and a split in the gluteus musculature from the greater trochanter

## 2019-10-04 NOTE — Progress Notes (Signed)
OT Cancellation Note  Patient Details Name: Lindsey Butler MRN: [id number] DOB: [birth year]   Cancelled Treatment:    Reason Eval/Treat Not Completed: Medical issues which prohibited therapy;Active bedrest order. Pt with active bedrest order, waiting on orthopedic consult this am for potential surgical intervention.    Guadelupe Sabin, OTR/L  [phone] 10/04/2019, 7:09 AM

## 2019-10-04 NOTE — Anesthesia Procedure Notes (Signed)
Procedure Name: Intubation Date/Time: 10/04/2019 2:02 PM Performed by: Ollen Bowl, CRNA Pre-anesthesia Checklist: Patient identified, Patient being monitored, Timeout performed, Emergency Drugs available and Suction available Patient Re-evaluated:Patient Re-evaluated prior to induction Oxygen Delivery Method: Circle system utilized Preoxygenation: Pre-oxygenation with 100% oxygen Induction Type: IV induction Ventilation: Mask ventilation without difficulty Laryngoscope Size: Mac and 3 Grade View: Grade I Tube type: Oral Tube size: 7.0 mm Number of attempts: 1 Airway Equipment and Method: Stylet Placement Confirmation: ETT inserted through vocal cords under direct vision,  positive ETCO2 and breath sounds checked- equal and bilateral Secured at: 21 cm Tube secured with: Tape Dental Injury: Teeth and Oropharynx as per pre-operative assessment

## 2019-10-04 NOTE — H&P (View-Only) (Signed)
Ortho care   In hospital consultation  Dr. Heath Lark requesting consultation  Chief Complaint  Patient presents with  . Hip Pain    left    86 year old female mechanical fall Left hip pain Pain is nonradiating Located over the left proximal thigh and left hip Pain is severe Pain is sharp Pain is associated with increased pain with movement Patient is unable to ambulate  Review of Systems  Constitutional: Negative for chills, fever and weight loss.  Respiratory: Negative for shortness of breath.   Cardiovascular: Negative for chest pain.  Musculoskeletal: Positive for back pain and falls.  Skin:       Bouts of cellulitis tenuous skin easy bruising  Neurological: Positive for weakness.  Endo/Heme/Allergies: Bruises/bleeds easily.  All other systems reviewed and are negative.  Past Medical History:  Diagnosis Date  . Actinic keratosis   . Asthma   . GERD (gastroesophageal reflux disease)   . Hyperlipidemia   . Hypothyroidism   . Osteoarthritis    She has a left total knee She also had lumbar spine surgery  Family History  Problem Relation Age of Onset  . Cancer Son   . Liver cancer Brother    Social History   Tobacco Use  . Smoking status: Never Smoker  . Smokeless tobacco: Never Used  Substance Use Topics  . Alcohol use: No  . Drug use: No   BP 139/71   Pulse 93   Temp 98.3 F (36.8 C) (Oral)   Resp 20   Ht 5\' 6"  (1.676 m)   Wt 62.8 kg   SpO2 95%   BMI 22.35 kg/m   She has a small thin frail appearing 86 year old female who is in moderate to severe pain lying supine in the bed She is awake alert and oriented x3 mood and affect are normal and flat respectively Gait unable to walk  Right and left upper extremity I find no malalignment contracture subluxation atrophy or tremor again the skin is very tenuous various areas of subcutaneous bruising but no tenderness  Right lower extremity no tenderness normal range of motion to passive  testing no instability of the hip knee or ankle muscle tone is normal skin again shows darkening of the proximal to mid tibia distally towards the leg with edema  All 4 extremities have normal pulse and perfusion normal color and capillary refill  Neurologically reflexes are normal in the upper extremities and right lower extremity sensation is normal in all 4 extremities coordination upper extremities normal lower extremities could not be tested balance could not be tested  Assessment and plan and medical decision making  CBC Latest Ref Rng & Units 10/04/2019 10/03/2019 10/05/2016  WBC 4.0 - 10.5 K/uL 12.2(H) 11.9(H) 9.6  Hemoglobin 12.0 - 15.0 g/dL 12.7 12.3 12.6  Hematocrit 36.0 - 46.0 % 38.7 38.8 39.1  Platelets 150 - 400 K/uL 381 361 439(H)    BMP Latest Ref Rng & Units 10/04/2019 10/03/2019 06/01/2010  Glucose 70 - 99 mg/dL 131(H) 117(H) 104(H)  BUN 8 - 23 mg/dL 14 16 -  Creatinine 0.44 - 1.00 mg/dL 0.54 0.60 -  Sodium 135 - 145 mmol/L 135 135 139  Potassium 3.5 - 5.1 mmol/L 3.6 3.6 3.6  Chloride 98 - 111 mmol/L 104 106 -  CO2 22 - 32 mmol/L 21(L) 21(L) -  Calcium 8.9 - 10.3 mg/dL 9.5 9.6 -    Images assessed: Were read as a femoral neck impacted fracture however this is a completely displaced left  femoral neck fracture.    CLINICAL DATA:  Injury.   EXAM: CHEST  1 VIEW   COMPARISON:  06/01/2019   FINDINGS: The heart size is enlarged. There is a large hiatal hernia. There is no pneumothorax. No large pleural effusion. There may be some scarring versus atelectasis at the lung bases. Advanced degenerative changes are noted of both glenohumeral joints, right worse than left. Aortic calcifications are noted.   IMPRESSION: Cardiomegaly with large hiatal hernia. No evidence for acute disease or significant interval change from prior.     Electronically Signed   By: Constance Holster M.D.   On: 10/03/2019 21:02   Diagnosis left femoral neck fracture completely  displaced  Plan partial hip replacement left hip

## 2019-10-04 NOTE — Progress Notes (Signed)
PT Cancellation Note  Patient Details Name: Lindsey Butler MRN: [id number] DOB: 08/19/1928   Cancelled Treatment:    Reason Eval/Treat Not Completed: Medical issues which prohibited therapy;Active bedrest order Pt with active bedrest order.  Per orthopedic consult and RN, patient will be having surgery today and will require new PT order when appropriate.    9:57 AM, 10/04/19 Mearl Latin PT, DPT Physical Therapist at [facility]

## 2019-10-04 NOTE — Anesthesia Preprocedure Evaluation (Signed)
Anesthesia Evaluation  Patient identified by MRN, date of birth, ID band Patient awake    Reviewed: Allergy & Precautions, NPO status , Patient's Chart, lab work & pertinent test results  Airway Mallampati: II  TM Distance: >3 FB Neck ROM: Full    Dental no notable dental hx. (+) Teeth Intact   Pulmonary asthma ,    Pulmonary exam normal breath sounds clear to auscultation       Cardiovascular Exercise Tolerance: Good negative cardio ROS Normal cardiovascular examI Rhythm:Regular Rate:Normal     Neuro/Psych negative neurological ROS  negative psych ROS   GI/Hepatic Neg liver ROS, GERD  Medicated and Controlled,  Endo/Other  Hypothyroidism   Renal/GU negative Renal ROS  negative genitourinary   Musculoskeletal  (+) Arthritis , Osteoarthritis,    Abdominal   Peds negative pediatric ROS (+)  Hematology negative hematology ROS (+)   Anesthesia Other Findings Pt blind - recent loss of her husband - pleasant - good historian - reports fall without LOC  Reproductive/Obstetrics negative OB ROS                             Anesthesia Physical Anesthesia Plan  ASA: III  Anesthesia Plan: General   Post-op Pain Management:    Induction: Intravenous  PONV Risk Score and Plan: 3 and Ondansetron, Dexamethasone and Treatment may vary due to age or medical condition  Airway Management Planned: Oral ETT  Additional Equipment:   Intra-op Plan:   Post-operative Plan: Extubation in OR  Informed Consent: I have reviewed the patients History and Physical, chart, labs and discussed the procedure including the risks, benefits and alternatives for the proposed anesthesia with the patient or authorized representative who has indicated his/her understanding and acceptance.     Dental advisory given  Plan Discussed with: CRNA  Anesthesia Plan Comments: (Plan Full PPE use Plan GETA D/W PT -WTP  with same after Q&A  D/w pt unlikely need for postop ventilation - voiced understanding -WTP with GETA)        Anesthesia Quick Evaluation

## 2019-10-04 NOTE — Progress Notes (Signed)
Patient new admit to Unit 300. BP 186/96, Heart rate 105. MD notified. No new orders

## 2019-10-05 LAB — CBC
HCT: 31.6 % — ABNORMAL LOW (ref 36.0–46.0)
Hemoglobin: 9.9 g/dL — ABNORMAL LOW (ref 12.0–15.0)
MCH: 31.7 pg (ref 26.0–34.0)
MCHC: 31.3 g/dL (ref 30.0–36.0)
MCV: 101.3 fL — ABNORMAL HIGH (ref 80.0–100.0)
Platelets: 264 10*3/uL (ref 150–400)
RBC: 3.12 MIL/uL — ABNORMAL LOW (ref 3.87–5.11)
RDW: 13.7 % (ref 11.5–15.5)
WBC: 12.9 10*3/uL — ABNORMAL HIGH (ref 4.0–10.5)
nRBC: 0 % (ref 0.0–0.2)

## 2019-10-05 LAB — BASIC METABOLIC PANEL
Anion gap: 8 (ref 5–15)
BUN: 13 mg/dL (ref 8–23)
CO2: 21 mmol/L — ABNORMAL LOW (ref 22–32)
Calcium: 8.7 mg/dL — ABNORMAL LOW (ref 8.9–10.3)
Chloride: 106 mmol/L (ref 98–111)
Creatinine, Ser: 0.61 mg/dL (ref 0.44–1.00)
GFR calc Af Amer: >60 mL/min (ref >60)
GFR calc non Af Amer: >60 mL/min (ref >60)
Glucose, Bld: 111 mg/dL — ABNORMAL HIGH (ref 70–99)
Potassium: 3.5 mmol/L (ref 3.5–5.1)
Sodium: 135 mmol/L (ref 135–145)

## 2019-10-05 MED ORDER — POTASSIUM CHLORIDE CRYS ER 20 MEQ PO TBCR
40.0000 meq | EXTENDED_RELEASE_TABLET | Freq: Every day | ORAL | Status: DC
  Administered 2019-10-05 – 2019-10-08 (×4): 40 meq via ORAL
  Filled 2019-10-05 (×4): qty 2

## 2019-10-05 NOTE — NC FL2 (Signed)
[place] LEVEL OF CARE SCREENING TOOL     IDENTIFICATION  Patient Name: Lindsey Butler Birthdate: [birth year] Sex: female Admission Date (Current Location): 10/03/2019  [facility] and [place] Number:  [employer] and Address:  [facility] [address] [place]      Provider Number: [id number]  Attending Physician Name and Address:  Rodena Goldmann, DO  Relative Name and Phone Number:       Current Level of Care: Hospital Recommended Level of Care: [place] Prior Approval Number: [id number]  Date Approved/Denied:   PASRR Number:    Discharge Plan: SNF    Current Diagnoses: Patient Active Problem List   Diagnosis Date Noted  . Closed left hip fracture ([place]) 10/04/2019  . Fracture of femoral neck, left ([place]) 10/03/2019  . Asthma 09/14/2012  . Abnormal chest CT 06/18/2012  . Chronic cough 05/23/2012    Orientation RESPIRATION BLADDER Height & Weight     Self, Time, Situation, Place  Normal(see DC summary) External catheter Weight: 62.8 kg Height:  5\' 6"  (167.6 cm)  BEHAVIORAL SYMPTOMS/MOOD NEUROLOGICAL BOWEL NUTRITION STATUS      Continent Diet(see DC summary)  AMBULATORY STATUS COMMUNICATION OF NEEDS Skin   Extensive Assist Verbally Surgical wounds(left hip surgical wound. left arm skin tear)                       Personal Care Assistance Level of Assistance  Bathing, Dressing, Feeding Bathing Assistance: Limited assistance Feeding assistance: Independent Dressing Assistance: Limited assistance     Functional Limitations Info  Sight, Hearing, Speech Sight Info: Adequate Hearing Info: Adequate Speech Info: Adequate    SPECIAL CARE FACTORS FREQUENCY  PT (By licensed PT)     PT Frequency: 5x/week              Contractures Contractures Info: Not present    Additional Factors Info  Code Status, Allergies Code Status Info: Full Allergies Info: NKA           Current  Medications (10/05/2019):  This is the current hospital active medication list Current Facility-Administered Medications  Medication Dose Route Frequency Provider Last Rate Last Admin  . 0.9 %  sodium chloride infusion (Manually program via Guardrails IV Fluids)   Intravenous Once Carole Civil, MD      . acetaZOLAMIDE (DIAMOX) tablet 250 mg  250 mg Oral BID Carole Civil, MD   250 mg at 10/05/19 [id number]  . albuterol (PROVENTIL) (2.5 MG/3ML) 0.083% nebulizer solution 2.5 mg  2.5 mg Nebulization Q4H PRN Carole Civil, MD      . aspirin EC tablet 325 mg  325 mg Oral Q breakfast Carole Civil, MD   325 mg at 10/05/19 0856  . bisacodyl (DULCOLAX) suppository 10 mg  10 mg Rectal Daily PRN Carole Civil, MD      . budesonide (PULMICORT) nebulizer solution 0.5 mg  0.5 mg Nebulization BID Carole Civil, MD   0.5 mg at 10/05/19 0824  . Chlorhexidine Gluconate Cloth 2 % PADS 6 each  6 each Topical Daily Carole Civil, MD   6 each at 10/05/19 [phone]  . docusate sodium (COLACE) capsule 100 mg  100 mg Oral BID Carole Civil, MD   100 mg at 10/05/19 0857  . enoxaparin (LOVENOX) injection 40 mg  40 mg Subcutaneous Q24H Carole Civil, MD   40 mg at 10/05/19 0047  . feeding supplement (  ENSURE ENLIVE) (ENSURE ENLIVE) liquid 237 mL  237 mL Oral BID BM Shah, Pratik D, DO   237 mL at 10/05/19 0859  . furosemide (LASIX) tablet 40 mg  40 mg Oral Daily Carole Civil, MD   40 mg at 10/05/19 0856  . hydrALAZINE (APRESOLINE) tablet 25 mg  25 mg Oral Q6H PRN Carole Civil, MD      . HYDROcodone-acetaminophen (NORCO/VICODIN) 5-325 MG per tablet 1-2 tablet  1-2 tablet Oral Q6H PRN Carole Civil, MD   2 tablet at 10/05/19 1152  . levothyroxine (SYNTHROID) tablet 125 mcg  125 mcg Oral q morning - 10a Carole Civil, MD   125 mcg at 10/05/19 0855  . loratadine (CLARITIN) tablet 10 mg  10 mg Oral Daily Carole Civil, MD   10 mg at 10/05/19 0856  . losartan  (COZAAR) tablet 50 mg  50 mg Oral Daily Carole Civil, MD   50 mg at 10/05/19 0856  . menthol-cetylpyridinium (CEPACOL) lozenge 3 mg  1 lozenge Oral PRN Carole Civil, MD       Or  . phenol (CHLORASEPTIC) mouth spray 1 spray  1 spray Mouth/Throat PRN Carole Civil, MD      . methocarbamol (ROBAXIN) tablet 500 mg  500 mg Oral Q6H PRN Carole Civil, MD       Or  . methocarbamol (ROBAXIN) 500 mg in dextrose 5 % 50 mL IVPB  500 mg Intravenous Q6H PRN Carole Civil, MD      . metoCLOPramide (REGLAN) tablet 5-10 mg  5-10 mg Oral Q8H PRN Carole Civil, MD       Or  . metoCLOPramide (REGLAN) injection 5-10 mg  5-10 mg Intravenous Q8H PRN Carole Civil, MD      . montelukast (SINGULAIR) tablet 10 mg  10 mg Oral QHS Carole Civil, MD   10 mg at 10/04/19 2328  . morphine 2 MG/ML injection 0.5 mg  0.5 mg Intravenous Q2H PRN Carole Civil, MD   0.5 mg at 10/05/19 0853  . multivitamin with minerals tablet 1 tablet  1 tablet Oral Daily Manuella Ghazi, Pratik D, DO   1 tablet at 10/05/19 0857  . nutrition supplement (JUVEN) (JUVEN) powder packet 1 packet  1 packet Oral BID BM Manuella Ghazi, Pratik D, DO   1 packet at 10/05/19 0857  . ondansetron (ZOFRAN) tablet 4 mg  4 mg Oral Q6H PRN Carole Civil, MD       Or  . ondansetron [facility]) injection 4 mg  4 mg Intravenous Q6H PRN Carole Civil, MD   4 mg at 10/04/19 1915  . pantoprazole (PROTONIX) EC tablet 40 mg  40 mg Oral Daily Carole Civil, MD   40 mg at 10/05/19 0857  . potassium chloride SA (KLOR-CON) CR tablet 40 mEq  40 mEq Oral Daily Manuella Ghazi, Pratik D, DO   40 mEq at 10/05/19 1150  . predniSONE (DELTASONE) tablet 5 mg  5 mg Oral Daily Carole Civil, MD   5 mg at 10/05/19 0856  . sertraline (ZOLOFT) tablet 25 mg  25 mg Oral Daily Carole Civil, MD   25 mg at 10/05/19 [id number]     Discharge Medications: Please see discharge summary for a list of discharge medications.  Relevant Imaging  Results:  Relevant Lab Results:   Additional Information SSN [id number]  Kenedie Dirocco, Chauncey Reading, RN

## 2019-10-05 NOTE — Plan of Care (Signed)
  Problem: Acute Rehab PT Goals(only PT should resolve) Goal: Pt Will Go Supine/Side To Sit Flowsheets (Taken 10/05/2019 1315) Pt will go Supine/Side to Sit: with minimal assist Goal: Patient Will Transfer Sit To/From Stand Flowsheets (Taken 10/05/2019 1315) Patient will transfer sit to/from stand: with minimal assist Goal: Pt Will Transfer Bed To Chair/Chair To Bed Flowsheets (Taken 10/05/2019 1315) Pt will Transfer Bed to Chair/Chair to Bed: with min assist Goal: Pt Will Ambulate Flowsheets (Taken 10/05/2019 1315) Pt will Ambulate:  50 feet  with min guard assist  with rolling walker   1:16 PM, 10/05/19 Josue Hector PT DPT  Physical Therapist with [facility]  [phone]

## 2019-10-05 NOTE — Progress Notes (Signed)
PROGRESS NOTE    Lindsey Butler  [id number] DOB: 16-Aug-1928 DOA: 10/03/2019 PCP: Neale Burly, MD   Brief Narrative:  Per HPI: Lindsey Kettle Mooreis a 86 y.o.femalewith medical history significant ofhypertension, hypothyroidism, depression, GERD, asthmawho presented to the ER with a fall at home with left hip pain. Patient had a mechanical fall at home around 1:30 PM when she was walking down the hall and says her left knee gave way it caused her to fall to the side. No head injury, no loss of consciousness. Unable to get up without assistance. Found to have a left femoral neck fracture on admission. Case discussed with orthopedic surgeon on call will plan for intervention in a.m.  2/26: Plans for partial left hip replacement today per orthopedics.  Pain appears to be well controlled.  2/27: Patient underwent ORIF to left hip yesterday.  Denies any significant pain concerns.  Awaiting PT evaluation and SNF.  Assessment & Plan:   Principal Problem:   Fracture of femoral neck, left (HCC) Active Problems:   Closed left hip fracture (HCC)   Left impacted femoral neck fracture -Status post mechanical fall -Underwent ORIF to L hip on 2/26 -Pain management as ordered  Anemia -Follow CBC -No overt bleeding  Hypertension-controlled -Continue losartan  Hypothyroidism -Continue levothyroxine  Depression -Continue sertraline  GERD -PPI  Asthma -No acute exacerbation currently noted -Continue Pulmicort with DuoNebs as needed   DVT prophylaxis: Lovenox Code Status: Full Family Communication: Will call daughter-in-law on phone Disposition Plan: Partial left hip replacement yesterday, will need PT evaluation and SNF placement.   Consultants:   Orthopedics  Procedures:   See below  ORIF to L hip 2/26  Antimicrobials:  Anti-infectives (From admission, onward)   Start     Dose/Rate Route Frequency Ordered Stop   10/04/19 1800  ceFAZolin  (ANCEF) IVPB 2g/100 mL premix     2 g 200 mL/hr over 30 Minutes Intravenous Every 6 hours 10/04/19 1647 10/05/19 0004   10/04/19 0930  ceFAZolin (ANCEF) IVPB 2g/100 mL premix     2 g 200 mL/hr over 30 Minutes Intravenous On call to O.R. 10/04/19 [id number] 10/04/19 1404       Subjective: Patient seen and evaluated today with no new acute complaints or concerns. No acute concerns or events noted overnight.  Objective: Vitals:   10/04/19 2322 10/05/19 0047 10/05/19 0543 10/05/19 0945  BP: (!) 143/64  (!) 114/59 (!) 108/55  Pulse: (!) 108 (!) 102 100 (!) 111  Resp: 20  20 18   Temp: 99.2 F (37.3 C)  98.9 F (37.2 C) 98.7 F (37.1 C)  TempSrc: Oral  Oral Oral  SpO2: 95% 94% 96% 96%  Weight:      Height:        Intake/Output Summary (Last 24 hours) at 10/05/2019 1113 Last data filed at 10/05/2019 0900 Gross per 24 hour  Intake 1320 ml  Output 1800 ml  Net -480 ml   Filed Weights   10/03/19 1827 10/04/19 0101  Weight: 66.7 kg 62.8 kg    Examination:  General exam: Appears calm and comfortable  Respiratory system: Clear to auscultation. Respiratory effort normal. Cardiovascular system: S1 & S2 heard, RRR. No JVD, murmurs, rubs, gallops or clicks. No pedal edema. Gastrointestinal system: Abdomen is nondistended, soft and nontender. No organomegaly or masses felt. Normal bowel sounds heard. Central nervous system: Alert and oriented. No focal neurological deficits. Extremities: Symmetric 5 x 5 power.  Left hip dressing clean dry and intact. Skin:  No rashes, lesions or ulcers Psychiatry: Judgement and insight appear normal. Mood & affect appropriate.     Data Reviewed: I have personally reviewed following labs and imaging studies  CBC: Recent Labs  Lab 10/03/19 2055 10/04/19 0439 10/05/19 0731  WBC 11.9* 12.2* 12.9*  NEUTROABS 9.9*  --   --   HGB 12.3 12.7 9.9*  HCT 38.8 38.7 31.6*  MCV 99.2 97.0 101.3*  PLT 361 381 [id number]   Basic Metabolic Panel: Recent Labs  Lab  10/03/19 2055 10/04/19 0439 10/05/19 0731  NA 135 135 135  K 3.6 3.6 3.5  CL 106 104 106  CO2 21* 21* 21*  GLUCOSE 117* 131* 111*  BUN 16 14 13   CREATININE 0.60 0.54 0.61  CALCIUM 9.6 9.5 8.7*   GFR: Estimated Creatinine Clearance: 45.5 mL/min (by C-G formula based on SCr of 0.61 mg/dL). Liver Function Tests: Recent Labs  Lab 10/03/19 2055  AST 19  ALT 19  ALKPHOS 56  BILITOT 0.6  PROT 6.6  ALBUMIN 3.7   No results for input(s): LIPASE, AMYLASE in the last 168 hours. No results for input(s): AMMONIA in the last 168 hours. Coagulation Profile: No results for input(s): INR, PROTIME in the last 168 hours. Cardiac Enzymes: No results for input(s): CKTOTAL, CKMB, CKMBINDEX, TROPONINI in the last 168 hours. BNP (last 3 results) No results for input(s): PROBNP in the last 8760 hours. HbA1C: No results for input(s): HGBA1C in the last 72 hours. CBG: No results for input(s): GLUCAP in the last 168 hours. Lipid Profile: No results for input(s): CHOL, HDL, LDLCALC, TRIG, CHOLHDL, LDLDIRECT in the last 72 hours. Thyroid Function Tests: No results for input(s): TSH, T4TOTAL, FREET4, T3FREE, THYROIDAB in the last 72 hours. Anemia Panel: No results for input(s): VITAMINB12, FOLATE, FERRITIN, TIBC, IRON, RETICCTPCT in the last 72 hours. Sepsis Labs: No results for input(s): PROCALCITON, LATICACIDVEN in the last 168 hours.  Recent Results (from the past 240 hour(s))  Respiratory Panel by RT PCR (Flu A&B, Covid) - Nasopharyngeal Swab     Status: None   Collection Time: 10/03/19 10:23 PM   Specimen: Nasopharyngeal Swab  Result Value Ref Range Status   SARS Coronavirus 2 by RT PCR NEGATIVE NEGATIVE Final    Comment: (NOTE) SARS-CoV-2 target nucleic acids are NOT DETECTED. The SARS-CoV-2 RNA is generally detectable in upper respiratoy specimens during the acute phase of infection. The lowest concentration of SARS-CoV-2 viral copies this assay can detect is 131 copies/mL. A  negative result does not preclude SARS-Cov-2 infection and should not be used as the sole basis for treatment or other patient management decisions. A negative result may occur with  improper specimen collection/handling, submission of specimen other than nasopharyngeal swab, presence of viral mutation(s) within the areas targeted by this assay, and inadequate number of viral copies (<131 copies/mL). A negative result must be combined with clinical observations, patient history, and epidemiological information. The expected result is Negative. Fact Sheet for Patients:  PinkCheek.be Fact Sheet for Healthcare Providers:  GravelBags.it This test is not yet ap proved or cleared by the [place] FDA and  has been authorized for detection and/or diagnosis of SARS-CoV-2 by FDA under an Emergency Use Authorization (EUA). This EUA will remain  in effect (meaning this test can be used) for the duration of the COVID-19 declaration under Section 564(b)(1) of the Act, 21 U.S.C. section 360bbb-3(b)(1), unless the authorization is terminated or revoked sooner.    Influenza A by PCR NEGATIVE NEGATIVE Final  Influenza B by PCR NEGATIVE NEGATIVE Final    Comment: (NOTE) The Xpert Xpress SARS-CoV-2/FLU/RSV assay is intended as an aid in  the diagnosis of influenza from Nasopharyngeal swab specimens and  should not be used as a sole basis for treatment. Nasal washings and  aspirates are unacceptable for Xpert Xpress SARS-CoV-2/FLU/RSV  testing. Fact Sheet for Patients: PinkCheek.be Fact Sheet for Healthcare Providers: GravelBags.it This test is not yet approved or cleared by the [place] FDA and  has been authorized for detection and/or diagnosis of SARS-CoV-2 by  FDA under an Emergency Use Authorization (EUA). This EUA will remain  in effect (meaning this test can be used) for  the duration of the  Covid-19 declaration under Section 564(b)(1) of the Act, 21  U.S.C. section 360bbb-3(b)(1), unless the authorization is  terminated or revoked. Performed at [facility], [address], [place], [place] [address]   Surgical PCR screen     Status: None   Collection Time: 10/04/19  1:23 AM   Specimen: Nasal Mucosa; Nasal Swab  Result Value Ref Range Status   MRSA, PCR NEGATIVE NEGATIVE Final   Staphylococcus aureus NEGATIVE NEGATIVE Final    Comment: (NOTE) The Xpert SA Assay (FDA approved for NASAL specimens in patients 23 years of age and older), is one component of a comprehensive surveillance program. It is not intended to diagnose infection nor to guide or monitor treatment. Performed at [facility], [address], [place], [place] [address]          Radiology Studies: DG Chest 1 View  Result Date: 10/03/2019 CLINICAL DATA:  Injury. EXAM: CHEST  1 VIEW COMPARISON:  06/01/2019 FINDINGS: The heart size is enlarged. There is a large hiatal hernia. There is no pneumothorax. No large pleural effusion. There may be some scarring versus atelectasis at the lung bases. Advanced degenerative changes are noted of both glenohumeral joints, right worse than left. Aortic calcifications are noted. IMPRESSION: Cardiomegaly with large hiatal hernia. No evidence for acute disease or significant interval change from prior. Electronically Signed   By: Constance Holster M.D.   On: 10/03/2019 21:02   DG Knee 1-2 Views Left  Result Date: 10/03/2019 CLINICAL DATA:  Pain EXAM: LEFT KNEE - 1-2 VIEW COMPARISON:  February 16, 2019. FINDINGS: The patient is status post total knee arthroplasty on the left. The hardware appears to be grossly intact. There may be a trace suprapatellar joint effusion. There is no evidence for area a periprosthetic fracture. IMPRESSION: No acute displaced fracture. Electronically Signed   By: Constance Holster M.D.   On: 10/03/2019 21:04   DG Pelvis  Portable  Result Date: 10/04/2019 CLINICAL DATA:  Postop left hip EXAM: PORTABLE PELVIS 1-2 VIEWS COMPARISON:  05/28/2019 FINDINGS: Left hip hemiarthroplasty in satisfactory position. Overlying skin staples with soft tissue gas. Mild degenerative changes of the right hip. Visualized bony pelvis appears intact. IMPRESSION: Left hip hemiarthroplasty in satisfactory position. Electronically Signed   By: Julian Hy M.D.   On: 10/04/2019 16:37   DG Hip Unilat W or Wo Pelvis 2-3 Views Left  Result Date: 10/03/2019 CLINICAL DATA:  [place], left hip pain EXAM: DG HIP (WITH OR WITHOUT PELVIS) 2-3V LEFT COMPARISON:  05/28/2019 FINDINGS: Frontal view of the pelvis as well as frontal and cross-table lateral views of the left hip are obtained. There is a subcapital left femoral neck fracture with proximal migration of the femur. Femoral head articulates normally with the left acetabulum. Bilateral hip osteoarthritis unchanged. The remainder of the  bony pelvis is stable. Postsurgical changes mid lumbar spine. IMPRESSION: 1. Impacted subcapital left femoral neck fracture. Electronically Signed   By: Randa Ngo M.D.   On: 10/03/2019 20:55   DG Femur Min 2 Views Left  Result Date: 10/03/2019 CLINICAL DATA:  [place], left hip pain EXAM: LEFT FEMUR 2 VIEWS COMPARISON:  05/28/2019 FINDINGS: Frontal and lateral views of the left femur demonstrates an impacted subcapital femoral neck fracture, with proximal migration of the femur relative to the femoral head. No dislocation. Left knee arthroplasty appears well aligned. Soft tissues are unremarkable. IMPRESSION: 1. Impacted subcapital left femoral neck fracture. Electronically Signed   By: Randa Ngo M.D.   On: 10/03/2019 20:56        Scheduled Meds: . sodium chloride   Intravenous Once  . acetaZOLAMIDE  250 mg Oral BID  . aspirin EC  325 mg Oral Q breakfast  . budesonide  0.5 mg Nebulization BID  . Chlorhexidine Gluconate Cloth  6 each Topical Daily  .  docusate sodium  100 mg Oral BID  . enoxaparin (LOVENOX) injection  40 mg Subcutaneous Q24H  . feeding supplement (ENSURE ENLIVE)  237 mL Oral BID BM  . furosemide  40 mg Oral Daily  . levothyroxine  125 mcg Oral q morning - 10a  . loratadine  10 mg Oral Daily  . losartan  50 mg Oral Daily  . montelukast  10 mg Oral QHS  . multivitamin with minerals  1 tablet Oral Daily  . nutrition supplement (JUVEN)  1 packet Oral BID BM  . pantoprazole  40 mg Oral Daily  . potassium chloride  40 mEq Oral Daily  . predniSONE  5 mg Oral Daily  . sertraline  25 mg Oral Daily   Continuous Infusions: . methocarbamol (ROBAXIN) IV       LOS: 1 day    Time spent: 30 minutes    Nonie Lochner Darleen Crocker, DO [facility] Pager [phone]  If 7PM-7AM, please contact night-coverage [email or web address] Password [email or web address] 10/05/2019, 11:13 AM

## 2019-10-05 NOTE — Progress Notes (Addendum)
   10/05/19 1650  Vitals  BP (!) 95/50 (dina map Right arm )  MAP (mmHg) (!) 64  Pulse Rate 86  MEWS Score  MEWS Temp 0  MEWS Systolic 1  MEWS Pulse 0  MEWS RR 0  MEWS LOC 0  MEWS Score 1  MEWS Score Color Green   BP Left arm 88/48.  Dr. Manuella Ghazi notified of low blood pressure readings. Awaiting new orders.  1800 No New orders received, will continue to monitor blood pressure per Dr Manuella Ghazi.

## 2019-10-05 NOTE — Care Management (Signed)
Left bipolar hip on 10/04/19. Recommended for SNF. Patient A&O x4. From home with son and DIL. Agreeable to referrals to [facility] and [facility]. Has been to [facility] in the past. Referrals made.

## 2019-10-05 NOTE — Evaluation (Signed)
Physical Therapy Evaluation Patient Details Name: Lindsey Butler MRN: [id number] DOB: Oct 07, 1928 Today's Date: 10/05/2019   History of Present Illness  Lindsey Butler is a 86 y.o. female with medical history significant of hypertension, hypothyroidism, depression, GERD, asthma who presented to the ER with a fall at home with left hip pain.  Patient had a mechanical fall at home around 1:30 PM when she was walking down the hall and says her left knee gave way it caused her to fall to the side.  No head injury, no loss of consciousness.  Unable to get up without assistance.  Found to have a left femoral neck fracture on admission.  Case discussed with orthopedic surgeon on call will plan for intervention in a.m.     Clinical Impression  Patient presents supine in bed with HOB elevated, SCDs in place. Patient awake and alert but reporting elevated pain and apprehensive to movement. Educated patient on and performed supine quad sets and ankle pumps for strength and mobility. Patient unable to perform partial ROM heel slides with assist due to pain and guarding. Patient required Max assist with bed mobility due to apprehension and muscle guarding. Patient with very poor return for verbal cues for hand placement and weight shifting. Patient unable to complete sit to stand with several attempts with therapist max A. Patient with poor return for verbal cues on hand placement and does not extend knees. Returned patient to bed, Max A for repositioning, call bell and phone within reach, SCDs reapplied, bed alarm set. Post op bandaging is intact and no signs of draining noted. Patient will benefit from continued physical therapy in hospital and recommended venue below to increase strength, balance, endurance for safe ADLs and gait.      Follow Up Recommendations SNF;Supervision/Assistance - 24 hour;Supervision for mobility/OOB    Equipment Recommendations       Recommendations for Other Services        Precautions / Restrictions Precautions Precautions: Fall Restrictions Weight Bearing Restrictions: Yes LLE Weight Bearing: Weight bearing as tolerated      Mobility  Bed Mobility Overal bed mobility: Needs Assistance Bed Mobility: Rolling;Sidelying to Sit;Supine to Sit;Sit to Supine Rolling: Max assist Sidelying to sit: Max assist Supine to sit: Max assist Sit to supine: Max assist   General bed mobility comments: slow, labored movement, very guarded, need max VCs for hand placement  Transfers Overall transfer level: Needs assistance   Transfers: Sit to/from Stand Sit to Stand: Max assist         General transfer comment: very guarded, apprehensive, unable to maintain standing independantly, does not extend knees  Ambulation/Gait                Stairs            Wheelchair Mobility    Modified Rankin (Stroke Patients Only)       Balance Overall balance assessment: Needs assistance Sitting-balance support: Bilateral upper extremity supported;Feet supported Sitting balance-Leahy Scale: Poor Sitting balance - Comments: at EOB Postural control: Posterior lean   Standing balance-Leahy Scale: Zero Standing balance comment: Unable to stand fully upright, with max assist, does not extend knees, requires max verbal cues for weight shift and hand placement                             Pertinent Vitals/Pain Pain Assessment: 0-10 Pain Score: 7  Pain Location: low back Pain Descriptors / Indicators: Aching;Sore Pain  Intervention(s): Limited activity within patient's tolerance;Monitored during session    [place] expects to be discharged to:: Private residence Living Arrangements: Children Available Help at Discharge: Family Type of Home: House Home Access: Stairs to enter Entrance Stairs-Rails: Left Entrance Stairs-Number of Steps: 3 Home Layout: One level [place] - single point;Walker - 2 wheels;Tub  bench;Wheelchair - manual      Prior Function Level of Independence: Independent               Hand Dominance        Extremity/Trunk Assessment   Upper Extremity Assessment Upper Extremity Assessment: Generalized weakness    Lower Extremity Assessment Lower Extremity Assessment: Generalized weakness       Communication   Communication: No difficulties  Cognition Arousal/Alertness: Awake/alert Behavior During Therapy: WFL for tasks assessed/performed Overall Cognitive Status: Within Functional Limits for tasks assessed                                        General Comments      Exercises General Exercises - Lower Extremity Ankle Circles/Pumps: Both;5 reps;Supine Quad Sets: Left;5 reps;Supine Heel Slides: (attempted, patient unable due to gaurding)   Assessment/Plan    PT Assessment Patient needs continued PT services  PT Problem List Decreased strength;Decreased mobility;Decreased range of motion;Decreased activity tolerance;Decreased balance;Pain       PT Treatment Interventions DME instruction;Gait training;Stair training;Functional mobility training;Therapeutic activities;Patient/family education;Therapeutic exercise;Balance training    PT Goals (Current goals can be found in the Care Plan section)  Acute Rehab PT Goals Patient Stated Goal: Return home PT Goal Formulation: With patient Time For Goal Achievement: 10/18/19 Potential to Achieve Goals: Good    Frequency BID   Barriers to discharge        Co-evaluation               AM-PAC PT "6 Clicks" Mobility  Outcome Measure Help needed turning from your back to your side while in a flat bed without using bedrails?: A Lot Help needed moving from lying on your back to sitting on the side of a flat bed without using bedrails?: A Lot Help needed moving to and from a bed to a chair (including a wheelchair)?: Total Help needed standing up from a chair using your arms (e.g.,  wheelchair or bedside chair)?: Total Help needed to walk in hospital room?: Total Help needed climbing 3-5 steps with a railing? : Total 6 Click Score: 8    End of Session Equipment Utilized During Treatment: Gait belt(RW) Activity Tolerance: Patient limited by pain Patient left: in bed;with call bell/phone within reach;with bed alarm set;with SCD's reapplied Nurse Communication: Mobility status PT Visit Diagnosis: Unsteadiness on feet (R26.81);Other abnormalities of gait and mobility (R26.89);Muscle weakness (generalized) (M62.81)    Time: 1055-1130 PT Time Calculation (min) (ACUTE ONLY): 35 min   Charges:   PT Evaluation $PT Eval Low Complexity: 1 Low PT Treatments $Therapeutic Activity: 8-22 mins   1:14 PM, 10/05/19 Josue Hector PT DPT  Physical Therapist with [facility]  [phone]

## 2019-10-05 NOTE — Progress Notes (Signed)
Patient ID: Lindsey Butler, female   DOB: [birth year], 86 y.o.   MRN: [id number]   BP (!) 108/55 (BP Location: Right Arm)   Pulse (!) 111   Temp 98.7 F (37.1 C) (Oral)   Resp 18   Ht 5\' 6"  (1.676 m)   Wt 62.8 kg   SpO2 96%   BMI 22.35 kg/m   RIGHT HIP FRACTURE , RIGHT HIP BIPOLAR   CBC Latest Ref Rng & Units 10/05/2019 10/04/2019 10/03/2019  WBC 4.0 - 10.5 K/uL 12.9(H) 12.2(H) 11.9(H)  Hemoglobin 12.0 - 15.0 g/dL 9.9(L) 12.7 12.3  Hematocrit 36.0 - 46.0 % 31.6(L) 38.7 38.8  Platelets 150 - 400 K/uL [phone]   She looks pretty good today. Complains of pain. Limb alignment is great  Dressing is dry.  Conitnue PT Ok to discharge when stable

## 2019-10-06 LAB — BASIC METABOLIC PANEL
Anion gap: 7 (ref 5–15)
BUN: 30 mg/dL — ABNORMAL HIGH (ref 8–23)
CO2: 22 mmol/L (ref 22–32)
Calcium: 9.1 mg/dL (ref 8.9–10.3)
Chloride: 104 mmol/L (ref 98–111)
Creatinine, Ser: 0.74 mg/dL (ref 0.44–1.00)
GFR calc Af Amer: >60 mL/min (ref >60)
GFR calc non Af Amer: >60 mL/min (ref >60)
Glucose, Bld: 93 mg/dL (ref 70–99)
Potassium: 3.8 mmol/L (ref 3.5–5.1)
Sodium: 133 mmol/L — ABNORMAL LOW (ref 135–145)

## 2019-10-06 LAB — CBC
HCT: 28.7 % — ABNORMAL LOW (ref 36.0–46.0)
Hemoglobin: 8.9 g/dL — ABNORMAL LOW (ref 12.0–15.0)
MCH: 31.7 pg (ref 26.0–34.0)
MCHC: 31 g/dL (ref 30.0–36.0)
MCV: 102.1 fL — ABNORMAL HIGH (ref 80.0–100.0)
Platelets: 229 10*3/uL (ref 150–400)
RBC: 2.81 MIL/uL — ABNORMAL LOW (ref 3.87–5.11)
RDW: 13.6 % (ref 11.5–15.5)
WBC: 11.4 10*3/uL — ABNORMAL HIGH (ref 4.0–10.5)
nRBC: 0 % (ref 0.0–0.2)

## 2019-10-06 LAB — MAGNESIUM: Magnesium: 1.7 mg/dL (ref 1.7–2.4)

## 2019-10-06 NOTE — Progress Notes (Signed)
Physical Therapy Treatment Patient Details Name: Lindsey Butler MRN: [id number] DOB: 03/28/1929 Today's Date: 10/06/2019    History of Present Illness Lindsey Butler is a 86 y.o. female with medical history significant of hypertension, hypothyroidism, depression, GERD, asthma who presented to the ER with a fall at home with left hip pain.  Patient had a mechanical fall at home around 1:30 PM when she was walking down the hall and says her left knee gave way it caused her to fall to the side.  No head injury, no loss of consciousness.  Unable to get up without assistance.  Found to have a left femoral neck fracture on admission.  Case discussed with orthopedic surgeon on call will plan for intervention in a.m.    PT Comments    Patient continues to be very guarded and apprehensive with movement. Patient tolerated treatment somewhat better than yesterday and was able to perform supine and seated BLE strengthening exercise. Patient requires Max A for AAROM heel slides. Patient continues to require Max A for bed mobility and transfers and shows poor return for verbal cues on weight shifting and hand placement. Patient has poor trunk control as well and requires Max A for upper body positioning until sitting completely upright at EOB. Patient was able to perform sit to stand x 2 today with Max A and was able to maintain standing for brief period to encourage WB. Patient required cues for upright posturing while standing. Patient able to take 2 small steps to Rt and LT at bedside to encourage WB and weight shifting. Pateint fatigued very quickly with this and was returned to supine in bed with HOB elevated, again with Max A for repositioning in bed, poor return for use of UES and RLE to assist in this. Patient will benefit from continued physical therapy in hospital and recommended venue below to increase strength, balance, endurance for safe ADLs and gait.     Follow Up Recommendations   SNF;Supervision/Assistance - 24 hour;Supervision for mobility/OOB     Equipment Recommendations       Recommendations for Other Services       Precautions / Restrictions Precautions Precautions: Fall Restrictions Weight Bearing Restrictions: Yes LLE Weight Bearing: Weight bearing as tolerated    Mobility  Bed Mobility Overal bed mobility: Needs Assistance Bed Mobility: Rolling;Sidelying to Sit;Supine to Sit;Sit to Supine Rolling: Max assist Sidelying to sit: Max assist Supine to sit: Max assist Sit to supine: Max assist   General bed mobility comments: slow, labored movement, very guarded, need max VCs for hand placement  Transfers Overall transfer level: Needs assistance   Transfers: Sit to/from Stand Sit to Stand: Max assist         General transfer comment: Poor return for verbal cues, max A for positioning, able to stand briefly today x 2  Ambulation/Gait Ambulation/Gait assistance: Max assist Gait Distance (Feet): 2 Feet Assistive device: Rolling walker (2 wheeled) Gait Pattern/deviations: Decreased stance time - left;Decreased step length - right;Decreased step length - left     General Gait Details: very guarded, small steps, flexed trunk, looks down at feet, poor return for verbal cueing for positioning and weight shift   Stairs             Wheelchair Mobility    Modified Rankin (Stroke Patients Only)       Balance Overall balance assessment: Needs assistance Sitting-balance support: Bilateral upper extremity supported;Feet supported Sitting balance-Leahy Scale: Poor Sitting balance - Comments: at EOB  Standing balance-Leahy Scale: Poor Standing balance comment: Able to stand briefly with Max A and RW for UE support                            Cognition Arousal/Alertness: Awake/alert Behavior During Therapy: WFL for tasks assessed/performed Overall Cognitive Status: Within Functional Limits for tasks assessed                                         Exercises General Exercises - Lower Extremity Ankle Circles/Pumps: Both;15 reps;Supine Quad Sets: Left;Supine;10 reps Long Arc Quad: Both;10 reps;Seated Heel Slides: AAROM;5 reps;Left;Supine(attempted, patient unable due to gaurding) Hip Flexion/Marching: Both;10 reps;Seated    General Comments        Pertinent Vitals/Pain Pain Assessment: No/denies pain    Home Living                      Prior Function            PT Goals (current goals can now be found in the care plan section) Acute Rehab PT Goals Patient Stated Goal: Return home PT Goal Formulation: With patient Time For Goal Achievement: 10/18/19 Potential to Achieve Goals: Good Progress towards PT goals: Progressing toward goals    Frequency    BID      PT Plan Current plan remains appropriate    Co-evaluation              AM-PAC PT "6 Clicks" Mobility   Outcome Measure  Help needed turning from your back to your side while in a flat bed without using bedrails?: A Lot Help needed moving from lying on your back to sitting on the side of a flat bed without using bedrails?: A Lot Help needed moving to and from a bed to a chair (including a wheelchair)?: Total Help needed standing up from a chair using your arms (e.g., wheelchair or bedside chair)?: Total Help needed to walk in hospital room?: Total Help needed climbing 3-5 steps with a railing? : Total 6 Click Score: 8    End of Session Equipment Utilized During Treatment: Gait belt(RW) Activity Tolerance: Patient limited by pain;Patient limited by fatigue Patient left: in bed;with call bell/phone within reach;with bed alarm set;with SCD's reapplied Nurse Communication: Mobility status PT Visit Diagnosis: Unsteadiness on feet (R26.81);Other abnormalities of gait and mobility (R26.89);Muscle weakness (generalized) (M62.81)     Time: [id number] PT Time Calculation (min) (ACUTE ONLY): 30  min  Charges:  $Therapeutic Exercise: 8-22 mins $Therapeutic Activity: 8-22 mins                     12:50 PM, 10/06/19 Lindsey Butler PT DPT  Physical Therapist with [facility]  [phone]

## 2019-10-06 NOTE — Progress Notes (Signed)
PROGRESS NOTE    Lindsey Butler  [id number] DOB: 10-24-1928 DOA: 10/03/2019 PCP: Neale Burly, MD   Brief Narrative:  Per HPI: Lindsey Kettle Mooreis a 86 y.o.femalewith medical history significant ofhypertension, hypothyroidism, depression, GERD, asthmawho presented to the ER with a fall at home with left hip pain. Patient had a mechanical fall at home around 1:30 PM when she was walking down the hall and says her left knee gave way it caused her to fall to the side. No head injury, no loss of consciousness. Unable to get up without assistance. Found to have a left femoral neck fracture on admission. Case discussed with orthopedic surgeon on call will plan for intervention in a.m.  2/26:Plans for partial left hip replacement today per orthopedics. Pain appears to be well controlled.  2/27: Patient underwent ORIF to left hip yesterday.  Denies any significant pain concerns.  Awaiting PT evaluation and SNF.  2/28: No acute concerns or events noted overnight.  She is overall doing well.  Hemoglobin appears to be downtrending which will be monitored.  Assessment & Plan:   Principal Problem:   Fracture of femoral neck, left (HCC) Active Problems:   Closed left hip fracture (HCC)   Left impacted femoral neck fracture -Status post mechanical fall -Underwent ORIF to L hip on 2/26 -Pain management as ordered -Continue on Lovenox for 35 days with staples out in 2 weeks and follow-up with orthopedics in 4 weeks  Anemia-downtrending -Follow CBC -No overt bleeding  Hypertension-controlled -Continue losartan  Hypothyroidism -Continue levothyroxine  Depression -Continue sertraline  GERD -PPI  Asthma -No acute exacerbation currently noted -Continue Pulmicort with DuoNebs as needed   DVT prophylaxis:Lovenox Code Status:Full Family Communication:Will call daughter-in-law on phone Disposition Plan:Partial left hip replacement yesterday, will need PT  evaluation and SNF placement.   Consultants:  Orthopedics  Procedures:  See below  ORIF to L hip 2/26  Antimicrobials:  Anti-infectives (From admission, onward)   Start     Dose/Rate Route Frequency Ordered Stop   10/04/19 1800  ceFAZolin (ANCEF) IVPB 2g/100 mL premix     2 g 200 mL/hr over 30 Minutes Intravenous Every 6 hours 10/04/19 1647 10/05/19 0004   10/04/19 0930  ceFAZolin (ANCEF) IVPB 2g/100 mL premix     2 g 200 mL/hr over 30 Minutes Intravenous On call to O.R. 10/04/19 [id number] 10/04/19 1404       Subjective: Patient seen and evaluated today with no new acute complaints or concerns. No acute concerns or events noted overnight.  Objective: Vitals:   10/05/19 2148 10/06/19 0148 10/06/19 0607 10/06/19 0803  BP: (!) 108/50 (!) 100/59 (!) 115/59   Pulse: 98 79 83   Resp: 18 16 16    Temp: 98.1 F (36.7 C) 98.8 F (37.1 C) 98.6 F (37 C)   TempSrc: Oral Oral Axillary   SpO2: 96% 97% 100% 96%  Weight:      Height:        Intake/Output Summary (Last 24 hours) at 10/06/2019 0956 Last data filed at 10/06/2019 0900 Gross per 24 hour  Intake 480 ml  Output 300 ml  Net 180 ml   Filed Weights   10/03/19 1827 10/04/19 0101  Weight: 66.7 kg 62.8 kg    Examination:  General exam: Appears calm and comfortable  Respiratory system: Clear to auscultation. Respiratory effort normal. Cardiovascular system: S1 & S2 heard, RRR. No JVD, murmurs, rubs, gallops or clicks. No pedal edema. Gastrointestinal system: Abdomen is nondistended, soft and nontender. No  organomegaly or masses felt. Normal bowel sounds heard. Central nervous system: Alert and oriented. No focal neurological deficits. Extremities: Symmetric 5 x 5 power.  Dressing to left hip clean dry and intact. Skin: No rashes, lesions or ulcers Psychiatry: Flat affect    Data Reviewed: I have personally reviewed following labs and imaging studies  CBC: Recent Labs  Lab 10/03/19 2055 10/04/19 0439  10/05/19 0731 10/06/19 0644  WBC 11.9* 12.2* 12.9* 11.4*  NEUTROABS 9.9*  --   --   --   HGB 12.3 12.7 9.9* 8.9*  HCT 38.8 38.7 31.6* 28.7*  MCV 99.2 97.0 101.3* 102.1*  PLT 361 381 264 [id number]   Basic Metabolic Panel: Recent Labs  Lab 10/03/19 2055 10/04/19 0439 10/05/19 0731 10/06/19 0644  NA 135 135 135 133*  K 3.6 3.6 3.5 3.8  CL 106 104 106 104  CO2 21* 21* 21* 22  GLUCOSE 117* 131* 111* 93  BUN 16 14 13  30*  CREATININE 0.60 0.54 0.61 0.74  CALCIUM 9.6 9.5 8.7* 9.1  MG  --   --   --  1.7   GFR: Estimated Creatinine Clearance: 45.5 mL/min (by C-G formula based on SCr of 0.74 mg/dL). Liver Function Tests: Recent Labs  Lab 10/03/19 2055  AST 19  ALT 19  ALKPHOS 56  BILITOT 0.6  PROT 6.6  ALBUMIN 3.7   No results for input(s): LIPASE, AMYLASE in the last 168 hours. No results for input(s): AMMONIA in the last 168 hours. Coagulation Profile: No results for input(s): INR, PROTIME in the last 168 hours. Cardiac Enzymes: No results for input(s): CKTOTAL, CKMB, CKMBINDEX, TROPONINI in the last 168 hours. BNP (last 3 results) No results for input(s): PROBNP in the last 8760 hours. HbA1C: No results for input(s): HGBA1C in the last 72 hours. CBG: No results for input(s): GLUCAP in the last 168 hours. Lipid Profile: No results for input(s): CHOL, HDL, LDLCALC, TRIG, CHOLHDL, LDLDIRECT in the last 72 hours. Thyroid Function Tests: No results for input(s): TSH, T4TOTAL, FREET4, T3FREE, THYROIDAB in the last 72 hours. Anemia Panel: No results for input(s): VITAMINB12, FOLATE, FERRITIN, TIBC, IRON, RETICCTPCT in the last 72 hours. Sepsis Labs: No results for input(s): PROCALCITON, LATICACIDVEN in the last 168 hours.  Recent Results (from the past 240 hour(s))  Respiratory Panel by RT PCR (Flu A&B, Covid) - Nasopharyngeal Swab     Status: None   Collection Time: 10/03/19 10:23 PM   Specimen: Nasopharyngeal Swab  Result Value Ref Range Status   SARS Coronavirus 2 by  RT PCR NEGATIVE NEGATIVE Final    Comment: (NOTE) SARS-CoV-2 target nucleic acids are NOT DETECTED. The SARS-CoV-2 RNA is generally detectable in upper respiratoy specimens during the acute phase of infection. The lowest concentration of SARS-CoV-2 viral copies this assay can detect is 131 copies/mL. A negative result does not preclude SARS-Cov-2 infection and should not be used as the sole basis for treatment or other patient management decisions. A negative result may occur with  improper specimen collection/handling, submission of specimen other than nasopharyngeal swab, presence of viral mutation(s) within the areas targeted by this assay, and inadequate number of viral copies (<131 copies/mL). A negative result must be combined with clinical observations, patient history, and epidemiological information. The expected result is Negative. Fact Sheet for Patients:  PinkCheek.be Fact Sheet for Healthcare Providers:  GravelBags.it This test is not yet ap proved or cleared by the [place] FDA and  has been authorized for detection and/or diagnosis of SARS-CoV-2  by FDA under an Emergency Use Authorization (EUA). This EUA will remain  in effect (meaning this test can be used) for the duration of the COVID-19 declaration under Section 564(b)(1) of the Act, 21 U.S.C. section 360bbb-3(b)(1), unless the authorization is terminated or revoked sooner.    Influenza A by PCR NEGATIVE NEGATIVE Final   Influenza B by PCR NEGATIVE NEGATIVE Final    Comment: (NOTE) The Xpert Xpress SARS-CoV-2/FLU/RSV assay is intended as an aid in  the diagnosis of influenza from Nasopharyngeal swab specimens and  should not be used as a sole basis for treatment. Nasal washings and  aspirates are unacceptable for Xpert Xpress SARS-CoV-2/FLU/RSV  testing. Fact Sheet for Patients: PinkCheek.be Fact Sheet for Healthcare  Providers: GravelBags.it This test is not yet approved or cleared by the [place] FDA and  has been authorized for detection and/or diagnosis of SARS-CoV-2 by  FDA under an Emergency Use Authorization (EUA). This EUA will remain  in effect (meaning this test can be used) for the duration of the  Covid-19 declaration under Section 564(b)(1) of the Act, 21  U.S.C. section 360bbb-3(b)(1), unless the authorization is  terminated or revoked. Performed at [facility], [address], [place], [place] [address]   Surgical PCR screen     Status: None   Collection Time: 10/04/19  1:23 AM   Specimen: Nasal Mucosa; Nasal Swab  Result Value Ref Range Status   MRSA, PCR NEGATIVE NEGATIVE Final   Staphylococcus aureus NEGATIVE NEGATIVE Final    Comment: (NOTE) The Xpert SA Assay (FDA approved for NASAL specimens in patients 66 years of age and older), is one component of a comprehensive surveillance program. It is not intended to diagnose infection nor to guide or monitor treatment. Performed at [facility], [address], [place], [place] [address]          Radiology Studies: DG Pelvis Portable  Result Date: 10/04/2019 CLINICAL DATA:  Postop left hip EXAM: PORTABLE PELVIS 1-2 VIEWS COMPARISON:  05/28/2019 FINDINGS: Left hip hemiarthroplasty in satisfactory position. Overlying skin staples with soft tissue gas. Mild degenerative changes of the right hip. Visualized bony pelvis appears intact. IMPRESSION: Left hip hemiarthroplasty in satisfactory position. Electronically Signed   By: Julian Hy M.D.   On: 10/04/2019 16:37        Scheduled Meds: . sodium chloride   Intravenous Once  . acetaZOLAMIDE  250 mg Oral BID  . aspirin EC  325 mg Oral Q breakfast  . budesonide  0.5 mg Nebulization BID  . Chlorhexidine Gluconate Cloth  6 each Topical Daily  . docusate sodium  100 mg Oral BID  . enoxaparin (LOVENOX) injection  40 mg Subcutaneous Q24H  .  feeding supplement (ENSURE ENLIVE)  237 mL Oral BID BM  . furosemide  40 mg Oral Daily  . levothyroxine  125 mcg Oral q morning - 10a  . loratadine  10 mg Oral Daily  . losartan  50 mg Oral Daily  . montelukast  10 mg Oral QHS  . multivitamin with minerals  1 tablet Oral Daily  . nutrition supplement (JUVEN)  1 packet Oral BID BM  . pantoprazole  40 mg Oral Daily  . potassium chloride  40 mEq Oral Daily  . predniSONE  5 mg Oral Daily  . sertraline  25 mg Oral Daily   Continuous Infusions: . methocarbamol (ROBAXIN) IV       LOS: 2 days    Time spent: 30 minutes    Bodhi Moradi D  Manuella Ghazi, DO [facility] Pager [phone]  If 7PM-7AM, please contact night-coverage [email or web address] Password [facility] 10/06/2019, 9:56 AM

## 2019-10-07 LAB — BASIC METABOLIC PANEL
Anion gap: 7 (ref 5–15)
BUN: 38 mg/dL — ABNORMAL HIGH (ref 8–23)
CO2: 22 mmol/L (ref 22–32)
Calcium: 9.6 mg/dL (ref 8.9–10.3)
Chloride: 107 mmol/L (ref 98–111)
Creatinine, Ser: 0.63 mg/dL (ref 0.44–1.00)
GFR calc Af Amer: >60 mL/min (ref >60)
GFR calc non Af Amer: >60 mL/min (ref >60)
Glucose, Bld: 95 mg/dL (ref 70–99)
Potassium: 3.9 mmol/L (ref 3.5–5.1)
Sodium: 136 mmol/L (ref 135–145)

## 2019-10-07 LAB — MAGNESIUM: Magnesium: 2 mg/dL (ref 1.7–2.4)

## 2019-10-07 LAB — CBC
HCT: 27.6 % — ABNORMAL LOW (ref 36.0–46.0)
Hemoglobin: 8.6 g/dL — ABNORMAL LOW (ref 12.0–15.0)
MCH: 31.7 pg (ref 26.0–34.0)
MCHC: 31.2 g/dL (ref 30.0–36.0)
MCV: 101.8 fL — ABNORMAL HIGH (ref 80.0–100.0)
Platelets: 275 10*3/uL (ref 150–400)
RBC: 2.71 MIL/uL — ABNORMAL LOW (ref 3.87–5.11)
RDW: 13.4 % (ref 11.5–15.5)
WBC: 10.6 10*3/uL — ABNORMAL HIGH (ref 4.0–10.5)
nRBC: 0 % (ref 0.0–0.2)

## 2019-10-07 MED ORDER — JUVEN PO PACK: 1.0000 | PACK | Freq: Two times a day (BID) | ORAL | 0 refills | Status: DC

## 2019-10-07 MED ORDER — POTASSIUM CHLORIDE CRYS ER 20 MEQ PO TBCR: 20.0000 meq | EXTENDED_RELEASE_TABLET | Freq: Every day | ORAL | 0 refills | Status: DC

## 2019-10-07 MED ORDER — ENSURE ENLIVE PO LIQD: 237.0000 mL | Freq: Two times a day (BID) | ORAL | 12 refills | Status: DC

## 2019-10-07 MED ORDER — BUDESONIDE 0.5 MG/2ML IN SUSP
0.5000 mg | Freq: Two times a day (BID) | RESPIRATORY_TRACT | Status: DC
  Administered 2019-10-08: 0.5 mg via RESPIRATORY_TRACT
  Filled 2019-10-07: qty 2

## 2019-10-07 MED ORDER — HYDROCODONE-ACETAMINOPHEN 5-325 MG PO TABS: 1.0000 | ORAL_TABLET | Freq: Four times a day (QID) | ORAL | 0 refills | Status: DC | PRN

## 2019-10-07 MED ORDER — ADULT MULTIVITAMIN W/MINERALS CH: 1.0000 | ORAL_TABLET | Freq: Every day | ORAL | 0 refills | Status: DC

## 2019-10-07 MED ORDER — ASPIRIN 325 MG PO TBEC: 325.0000 mg | DELAYED_RELEASE_TABLET | Freq: Every day | ORAL | 0 refills | Status: AC

## 2019-10-07 MED ORDER — DOCUSATE SODIUM 100 MG PO CAPS: 100.0000 mg | ORAL_CAPSULE | Freq: Two times a day (BID) | ORAL | 0 refills | Status: DC

## 2019-10-07 NOTE — Anesthesia Postprocedure Evaluation (Signed)
Anesthesia Post Note  Patient: Lindsey Butler  Procedure(s) Performed: ARTHROPLASTY BIPOLAR HIP (HEMIARTHROPLASTY) (Left Hip)  Anesthesia Post Evaluation   Last Vitals:  Vitals:   10/07/19 0401 10/07/19 0801  BP: (!) 120/55   Pulse: 99   Resp: 20   Temp: 37.3 C   SpO2: 97% 95%    Last Pain:  Vitals:   10/07/19 1054  TempSrc:   PainSc: 5                  [name] S Orlinda Slomski

## 2019-10-07 NOTE — Progress Notes (Signed)
Patient ate breakfast in bed with assistance from staff with setting breakfast up. Patient sitting up in chair, transferred to chair by PT. Patient is tolerated well. Pain medication given to patient prior to working with PT. Patient is resting in chair with eyes closed. Patient easily aroused and offers no complaints. Will continue to monitor.

## 2019-10-07 NOTE — Progress Notes (Signed)
Physical Therapy Treatment Patient Details Name: Lindsey Butler MRN: [id number] DOB: [birth year] Today's Date: 10/07/2019    History of Present Illness Lindsey Butler is a 86 y.o. female with medical history significant of hypertension, hypothyroidism, depression, GERD, asthma who presented to the ER with a fall at home with left hip pain.  Patient had a mechanical fall at home around 1:30 PM when she was walking down the hall and says her left knee gave way it caused her to fall to the side.  No head injury, no loss of consciousness.  Unable to get up without assistance.  Found to have a left femoral neck fracture on admission.  Case discussed with orthopedic surgeon on call will plan for intervention in a.m.    PT Comments    Patient demonstrates improvement for using BUE to sit up at bedside, had difficulty scooting to EOB requiring tactile cueing from behind left hip, limited to a few steps at bedside due to increase left hip pain and fall risk.  Patient tolerated sitting up in chair after therapy - RN aware.  Patient will benefit from continued physical therapy in hospital and recommended venue below to increase strength, balance, endurance for safe ADLs and gait.    Follow Up Recommendations  SNF;Supervision/Assistance - 24 hour;Supervision for mobility/OOB     Equipment Recommendations       Recommendations for Other Services       Precautions / Restrictions Precautions Precautions: Fall Restrictions Weight Bearing Restrictions: Yes LLE Weight Bearing: Weight bearing as tolerated    Mobility  Bed Mobility   Bed Mobility: Supine to Sit     Supine to sit: Mod assist     General bed mobility comments: demonstrates good return for using BUE for supine to sitting with verbal/tactile cueing  Transfers Overall transfer level: Needs assistance Equipment used: Rolling walker (2 wheeled) Transfers: Sit to/from [facility] Sit to Stand: Mod assist;Max  assist Stand pivot transfers: Max assist       General transfer comment: slow labored movement  Ambulation/Gait Ambulation/Gait assistance: Max assist Gait Distance (Feet): 3 Feet Assistive device: Rolling walker (2 wheeled) Gait Pattern/deviations: Decreased stance time - left;Decreased step length - right;Decreased step length - left;Antalgic Gait velocity: slow   General Gait Details: limited to 3-4 very unsteady labored side steps with difficulty advancing LLE due to increased pain/weakness   Stairs             Wheelchair Mobility    Modified Rankin (Stroke Patients Only)       Balance Overall balance assessment: Needs assistance Sitting-balance support: Feet supported;No upper extremity supported Sitting balance-Leahy Scale: Fair Sitting balance - Comments: seated at EOB   Standing balance support: During functional activity;Bilateral upper extremity supported Standing balance-Leahy Scale: Poor Standing balance comment: using RW                            Cognition Arousal/Alertness: Awake/alert Behavior During Therapy: WFL for tasks assessed/performed Overall Cognitive Status: Within Functional Limits for tasks assessed                                        Exercises Total Joint Exercises Ankle Circles/Pumps: Seated;AROM;Strengthening;Both;5 reps    General Comments        Pertinent Vitals/Pain Pain Assessment: Faces Faces Pain Scale: Hurts whole lot Pain Location:  left hip with  movement Pain Descriptors / Indicators: Aching;Sharp;Sore;Grimacing;Guarding Pain Intervention(s): Limited activity within patient's tolerance;Monitored during session;Premedicated before session    Home Living                      Prior Function            PT Goals (current goals can now be found in the care plan section) Acute Rehab PT Goals Patient Stated Goal: Return home PT Goal Formulation: With patient Time For Goal  Achievement: 10/18/19 Potential to Achieve Goals: Good Progress towards PT goals: Progressing toward goals    Frequency    BID      PT Plan Current plan remains appropriate    Co-evaluation              AM-PAC PT "6 Clicks" Mobility   Outcome Measure  Help needed turning from your back to your side while in a flat bed without using bedrails?: A Lot Help needed moving from lying on your back to sitting on the side of a flat bed without using bedrails?: A Lot Help needed moving to and from a bed to a chair (including a wheelchair)?: A Lot Help needed standing up from a chair using your arms (e.g., wheelchair or bedside chair)?: A Lot Help needed to walk in hospital room?: A Lot Help needed climbing 3-5 steps with a railing? : Total 6 Click Score: 11    End of Session   Activity Tolerance: Patient tolerated treatment well;Patient limited by fatigue;No increased pain Patient left: in chair;with call bell/phone within reach Nurse Communication: Mobility status PT Visit Diagnosis: Unsteadiness on feet (R26.81);Other abnormalities of gait and mobility (R26.89);Muscle weakness (generalized) (M62.81)     Time: [id number] PT Time Calculation (min) (ACUTE ONLY): 24 min  Charges:  $Therapeutic Activity: 23-37 mins                     2:10 PM, 10/07/19 Lonell Grandchild, MPT Physical Therapist with [facility] [phone] office [phone] mobile phone

## 2019-10-07 NOTE — Care Management Important Message (Signed)
Important Message  Patient Details  Name: Lindsey Butler MRN: [id number] Date of Birth: 1929/01/05   Medicare Important Message Given:  Yes     [name] 10/07/2019, 11:06 AM

## 2019-10-07 NOTE — Progress Notes (Signed)
Physical Therapy Treatment Patient Details Name: Lindsey Butler MRN: [id number] DOB: 08-04-1929 Today's Date: 10/07/2019    History of Present Illness Lindsey Butler is a 86 y.o. female with medical history significant of hypertension, hypothyroidism, depression, GERD, asthma who presented to the ER with a fall at home with left hip pain.  Patient had a mechanical fall at home around 1:30 PM when she was walking down the hall and says her left knee gave way it caused her to fall to the side.  No head injury, no loss of consciousness.  Unable to get up without assistance.  Found to have a left femoral neck fracture on admission.  Case discussed with orthopedic surgeon on call will plan for intervention in a.m.    PT Comments    Patient presents in chair and tolerated sitting up for greater than 3 hours, her visitor present at bedside.  Patient demonstrates fair/good return for scooting up in chair, had difficulty completing sit to stand requiring tactile/verbal cueing for proper placement of hands, limited to a few unsteady slow labored side steps with near loss of balance due to increasing left hip pain.  Patient put back to bed after therapy with visitor in room.  Patient will benefit from continued physical therapy in hospital and recommended venue below to increase strength, balance, endurance for safe ADLs and gait.    Follow Up Recommendations  SNF;Supervision/Assistance - 24 hour;Supervision for mobility/OOB     Equipment Recommendations  None recommended by PT    Recommendations for Other Services       Precautions / Restrictions Precautions Precautions: Fall Restrictions Weight Bearing Restrictions: Yes LLE Weight Bearing: Weight bearing as tolerated    Mobility  Bed Mobility Overal bed mobility: Needs Assistance Bed Mobility: Sit to Supine     Supine to sit: Mod assist Sit to supine: Max assist   General bed mobility comments: Patient unable to lift legs back onto bed  due to weakness and increasing left hip pain  Transfers Overall transfer level: Needs assistance Equipment used: Rolling walker (2 wheeled) Transfers: Sit to/from [facility] Sit to Stand: Mod assist;Max assist Stand pivot transfers: Mod assist;Max assist       General transfer comment: slow labored movement  Ambulation/Gait Ambulation/Gait assistance: Max assist Gait Distance (Feet): 3 Feet Assistive device: Rolling walker (2 wheeled) Gait Pattern/deviations: Decreased stance time - left;Decreased step length - right;Decreased step length - left;Antalgic Gait velocity: slow   General Gait Details: limited to 3-4 very unsteady labored side steps with buckling of knees due to weakness and increasing left hip pain   Stairs             Wheelchair Mobility    Modified Rankin (Stroke Patients Only)       Balance Overall balance assessment: Needs assistance Sitting-balance support: Feet supported;No upper extremity supported Sitting balance-Leahy Scale: Fair Sitting balance - Comments: seated in chair and at EOB   Standing balance support: During functional activity;Bilateral upper extremity supported Standing balance-Leahy Scale: Poor Standing balance comment: using RW                            Cognition Arousal/Alertness: Awake/alert Behavior During Therapy: WFL for tasks assessed/performed Overall Cognitive Status: Within Functional Limits for tasks assessed  Exercises Total Joint Exercises Ankle Circles/Pumps: Seated;AROM;Strengthening;Both;5 reps    General Comments        Pertinent Vitals/Pain Pain Assessment: Faces Faces Pain Scale: Hurts even more Pain Location: left hip with  movement Pain Descriptors / Indicators: Aching;Sharp;Sore;Grimacing;Guarding Pain Intervention(s): Limited activity within patient's tolerance;Monitored during session;Repositioned    Home  Living                      Prior Function            PT Goals (current goals can now be found in the care plan section) Acute Rehab PT Goals Patient Stated Goal: Return home PT Goal Formulation: With patient Time For Goal Achievement: 10/18/19 Potential to Achieve Goals: Good Progress towards PT goals: Progressing toward goals    Frequency    Min 6X/week      PT Plan Current plan remains appropriate    Co-evaluation              AM-PAC PT "6 Clicks" Mobility   Outcome Measure  Help needed turning from your back to your side while in a flat bed without using bedrails?: A Lot Help needed moving from lying on your back to sitting on the side of a flat bed without using bedrails?: A Lot Help needed moving to and from a bed to a chair (including a wheelchair)?: A Lot Help needed standing up from a chair using your arms (e.g., wheelchair or bedside chair)?: A Lot Help needed to walk in hospital room?: A Lot Help needed climbing 3-5 steps with a railing? : Total 6 Click Score: 11    End of Session   Activity Tolerance: Patient tolerated treatment well;Patient limited by fatigue;Patient limited by pain Patient left: with call bell/phone within reach;in bed;with family/visitor present Nurse Communication: Mobility status PT Visit Diagnosis: Unsteadiness on feet (R26.81);Other abnormalities of gait and mobility (R26.89);Muscle weakness (generalized) (M62.81)     Time: [id number] PT Time Calculation (min) (ACUTE ONLY): 27 min  Charges:  $Therapeutic Activity: 23-37 mins                     2:58 PM, 10/07/19 Lonell Grandchild, MPT Physical Therapist with [facility] [phone] office [phone] mobile phone

## 2019-10-07 NOTE — Discharge Summary (Signed)
Physician Discharge Summary  Lindsey Butler [id number] DOB: 05-02-1929 DOA: 10/03/2019  PCP: Lindsey Burly, MD  Admit date: 10/03/2019  Discharge date: 10/07/2019  Admitted From:Home  Disposition:  SNF  Recommendations for Outpatient Follow-up:  1. Follow up with PCP in 1-2 weeks 2. Follow-up with Lindsey Butler of orthopedics in 4 weeks 3. Continue on full dose aspirin for DVT prophylaxis for the next 35 days as prescribed 4. Norco given for pain management for 10 tablets and 0 refills 5. Weightbearing as tolerated with left hip precautions 6. Staples out in 2 weeks 7. Continue other home medications as prior  Home Health: None  Equipment/Devices: None  Discharge Condition: Stable  CODE STATUS: Full  Diet recommendation: Heart Healthy  Brief/Interim Summary: Per HPI: Lindsey B Mooreis a 86 y.o.femalewith medical history significant ofhypertension, hypothyroidism, depression, GERD, asthmawho presented to the ER with a fall at home with left hip pain. Patient had a mechanical fall at home around 1:30 PM when she was walking down the hall and says her left knee gave way it caused her to fall to the side. No head injury, no loss of consciousness. Unable to get up without assistance. Found to have a left femoral neck fracture on admission. Case discussed with orthopedic surgeon on call will plan for intervention in a.m.  2/26:Plans for partial left hip replacement today per orthopedics. Pain appears to be well controlled.  2/27:Patient underwent ORIF to left hip yesterday. Denies any significant pain concerns. Awaiting PT evaluation and SNF.  2/28: No acute concerns or events noted overnight.  She is overall doing well.  Hemoglobin appears to be downtrending which will be monitored.  3/1: Patient stable for discharge today with no other acute concerns noted.  She will remain on full dose aspirin for 35 days for DVT prophylaxis and follow-up with Lindsey Butler in  4 weeks.  Weightbearing as tolerated while in rehab with staples to be removed in the next 2 weeks.  Discharge Diagnoses:  Principal Problem:   Fracture of femoral neck, left (HCC) Active Problems:   Closed left hip fracture [facility])  Principal discharge diagnosis: Left impacted femoral neck fracture status post ORIF on 2/26.  Discharge Instructions  Discharge Instructions    Diet - low sodium heart healthy   Complete by: As directed    Increase activity slowly   Complete by: As directed      Allergies as of 10/07/2019   No Known Allergies     Medication List    STOP taking these medications   olmesartan 20 MG tablet Commonly known as: BENICAR   traMADol 50 MG tablet Commonly known as: ULTRAM     TAKE these medications   acetaZOLAMIDE 250 MG tablet Commonly known as: DIAMOX Take 250 mg by mouth 2 (two) times daily.   albuterol 108 (90 Base) MCG/ACT inhaler Commonly known as: VENTOLIN HFA Inhale 2 puffs into the lungs every 4 (four) hours as needed for wheezing or shortness of breath.   aspirin 325 MG EC tablet Take 1 tablet (325 mg total) by mouth daily with breakfast. Start taking on: October 08, 2019   budesonide 0.5 MG/2ML nebulizer solution Commonly known as: PULMICORT Take 0.5 mg by nebulization 2 (two) times daily.   docusate sodium 100 MG capsule Commonly known as: COLACE Take 1 capsule (100 mg total) by mouth 2 (two) times daily.   feeding supplement (ENSURE ENLIVE) Liqd Take 237 mLs by mouth 2 (two) times daily between meals.   nutrition  supplement (JUVEN) Pack Take 1 packet by mouth 2 (two) times daily between meals.   furosemide 40 MG tablet Commonly known as: LASIX Take 40 mg by mouth daily.   HYDROcodone-acetaminophen 5-325 MG tablet Commonly known as: NORCO/VICODIN Take 1 tablet by mouth every 6 (six) hours as needed for moderate pain or severe pain.   ipratropium-albuterol 0.5-2.5 (3) MG/3ML Soln Commonly known as: DUONEB Take 3 mLs by  nebulization every 6 (six) hours as needed (for sohrtness of breath).   levothyroxine 125 MCG tablet Commonly known as: SYNTHROID Take 125 mcg by mouth every morning.   loratadine 10 MG tablet Commonly known as: CLARITIN Take 10 mg by mouth daily.   losartan 50 MG tablet Commonly known as: COZAAR Take 50 mg by mouth daily.   montelukast 10 MG tablet Commonly known as: SINGULAIR Take 1 tablet by mouth Daily.   multivitamin with minerals Tabs tablet Take 1 tablet by mouth daily. Start taking on: October 08, 2019   NexIUM 40 MG capsule Generic drug: esomeprazole Take 1 tablet by mouth Daily.   potassium chloride SA 20 MEQ tablet Commonly known as: KLOR-CON Take 1 tablet (20 mEq total) by mouth daily. Start taking on: October 08, 2019   predniSONE 5 MG tablet Commonly known as: DELTASONE Take 5 mg by mouth daily.   sertraline 25 MG tablet Commonly known as: ZOLOFT Take 25 mg by mouth daily.      Follow-up Information    Lindsey Burly, MD Follow up in 1 week(s).   Specialty: Internal Medicine Contact information: [place] [phone]        Lindsey Civil, MD Follow up in 4 week(s).   Specialties: Orthopedic Surgery, Radiology Contact information: [address] [place] [address] [phone]          No Known Allergies  Consultations:  Orthopedics   Procedures/Studies: DG Chest 1 View  Result Date: 10/03/2019 CLINICAL DATA:  Injury. EXAM: CHEST  1 VIEW COMPARISON:  06/01/2019 FINDINGS: The heart size is enlarged. There is a large hiatal hernia. There is no pneumothorax. No large pleural effusion. There may be some scarring versus atelectasis at the lung bases. Advanced degenerative changes are noted of both glenohumeral joints, right worse than left. Aortic calcifications are noted. IMPRESSION: Cardiomegaly with large hiatal hernia. No evidence for acute disease or significant interval change from prior.  Electronically Signed   By: Constance Holster M.D.   On: 10/03/2019 21:02   DG Knee 1-2 Views Left  Result Date: 10/03/2019 CLINICAL DATA:  Pain EXAM: LEFT KNEE - 1-2 VIEW COMPARISON:  February 16, 2019. FINDINGS: The patient is status post total knee arthroplasty on the left. The hardware appears to be grossly intact. There may be a trace suprapatellar joint effusion. There is no evidence for area a periprosthetic fracture. IMPRESSION: No acute displaced fracture. Electronically Signed   By: Constance Holster M.D.   On: 10/03/2019 21:04   DG Pelvis Portable  Result Date: 10/04/2019 CLINICAL DATA:  Postop left hip EXAM: PORTABLE PELVIS 1-2 VIEWS COMPARISON:  05/28/2019 FINDINGS: Left hip hemiarthroplasty in satisfactory position. Overlying skin staples with soft tissue gas. Mild degenerative changes of the right hip. Visualized bony pelvis appears intact. IMPRESSION: Left hip hemiarthroplasty in satisfactory position. Electronically Signed   By: Julian Hy M.D.   On: 10/04/2019 16:37   DG Hip Unilat W or Wo Pelvis 2-3 Views Left  Result Date: 10/03/2019 CLINICAL DATA:  [place], left  hip pain EXAM: DG HIP (WITH OR WITHOUT PELVIS) 2-3V LEFT COMPARISON:  05/28/2019 FINDINGS: Frontal view of the pelvis as well as frontal and cross-table lateral views of the left hip are obtained. There is a subcapital left femoral neck fracture with proximal migration of the femur. Femoral head articulates normally with the left acetabulum. Bilateral hip osteoarthritis unchanged. The remainder of the bony pelvis is stable. Postsurgical changes mid lumbar spine. IMPRESSION: 1. Impacted subcapital left femoral neck fracture. Electronically Signed   By: Randa Ngo M.D.   On: 10/03/2019 20:55   DG Femur Min 2 Views Left  Result Date: 10/03/2019 CLINICAL DATA:  [place], left hip pain EXAM: LEFT FEMUR 2 VIEWS COMPARISON:  05/28/2019 FINDINGS: Frontal and lateral views of the left femur demonstrates an impacted subcapital  femoral neck fracture, with proximal migration of the femur relative to the femoral head. No dislocation. Left knee arthroplasty appears well aligned. Soft tissues are unremarkable. IMPRESSION: 1. Impacted subcapital left femoral neck fracture. Electronically Signed   By: Randa Ngo M.D.   On: 10/03/2019 20:56     Discharge Exam: Vitals:   10/07/19 0401 10/07/19 0801  BP: (!) 120/55   Pulse: 99   Resp: 20   Temp: 99.2 F (37.3 C)   SpO2: 97% 95%   Vitals:   10/06/19 1938 10/06/19 1959 10/07/19 0401 10/07/19 0801  BP:  (!) 146/61 (!) 120/55   Pulse: (!) 106 (!) 102 99   Resp: 18 18 20    Temp:  98.7 F (37.1 C) 99.2 F (37.3 C)   TempSrc:  Oral Oral   SpO2: 98% 99% 97% 95%  Weight:      Height:        General: Pt is alert, awake, not in acute distress Cardiovascular: RRR, S1/S2 +, no rubs, no gallops Respiratory: CTA bilaterally, no wheezing, no rhonchi Abdominal: Soft, NT, ND, bowel sounds + Extremities: no edema, no cyanosis, left hip dressings clean dry and intact.    The results of significant diagnostics from this hospitalization (including imaging, microbiology, ancillary and laboratory) are listed below for reference.     Microbiology: Recent Results (from the past 240 hour(s))  Respiratory Panel by RT PCR (Flu A&B, Covid) - Nasopharyngeal Swab     Status: None   Collection Time: 10/03/19 10:23 PM   Specimen: Nasopharyngeal Swab  Result Value Ref Range Status   SARS Coronavirus 2 by RT PCR NEGATIVE NEGATIVE Final    Comment: (NOTE) SARS-CoV-2 target nucleic acids are NOT DETECTED. The SARS-CoV-2 RNA is generally detectable in upper respiratoy specimens during the acute phase of infection. The lowest concentration of SARS-CoV-2 viral copies this assay can detect is 131 copies/mL. A negative result does not preclude SARS-Cov-2 infection and should not be used as the sole basis for treatment or other patient management decisions. A negative result may occur  with  improper specimen collection/handling, submission of specimen other than nasopharyngeal swab, presence of viral mutation(s) within the areas targeted by this assay, and inadequate number of viral copies (<131 copies/mL). A negative result must be combined with clinical observations, patient history, and epidemiological information. The expected result is Negative. Fact Sheet for Patients:  PinkCheek.be Fact Sheet for Healthcare Providers:  GravelBags.it This test is not yet ap proved or cleared by the [place] FDA and  has been authorized for detection and/or diagnosis of SARS-CoV-2 by FDA under an Emergency Use Authorization (EUA). This EUA will remain  in effect (meaning this test can be used)  for the duration of the COVID-19 declaration under Section 564(b)(1) of the Act, 21 U.S.C. section 360bbb-3(b)(1), unless the authorization is terminated or revoked sooner.    Influenza A by PCR NEGATIVE NEGATIVE Final   Influenza B by PCR NEGATIVE NEGATIVE Final    Comment: (NOTE) The Xpert Xpress SARS-CoV-2/FLU/RSV assay is intended as an aid in  the diagnosis of influenza from Nasopharyngeal swab specimens and  should not be used as a sole basis for treatment. Nasal washings and  aspirates are unacceptable for Xpert Xpress SARS-CoV-2/FLU/RSV  testing. Fact Sheet for Patients: PinkCheek.be Fact Sheet for Healthcare Providers: GravelBags.it This test is not yet approved or cleared by the [place] FDA and  has been authorized for detection and/or diagnosis of SARS-CoV-2 by  FDA under an Emergency Use Authorization (EUA). This EUA will remain  in effect (meaning this test can be used) for the duration of the  Covid-19 declaration under Section 564(b)(1) of the Act, 21  U.S.C. section 360bbb-3(b)(1), unless the authorization is  terminated or revoked. Performed  at [facility], [address], [place], [place] [address]   Surgical PCR screen     Status: None   Collection Time: 10/04/19  1:23 AM   Specimen: Nasal Mucosa; Nasal Swab  Result Value Ref Range Status   MRSA, PCR NEGATIVE NEGATIVE Final   Staphylococcus aureus NEGATIVE NEGATIVE Final    Comment: (NOTE) The Xpert SA Assay (FDA approved for NASAL specimens in patients 22 years of age and older), is one component of a comprehensive surveillance program. It is not intended to diagnose infection nor to guide or monitor treatment. Performed at [facility], [address] [place], [place] [address]      Labs: BNP (last 3 results) No results for input(s): BNP in the last 8760 hours. Basic Metabolic Panel: Recent Labs  Lab 10/03/19 2055 10/04/19 0439 10/05/19 0731 10/06/19 0644 10/07/19 0457  NA 135 135 135 133* 136  K 3.6 3.6 3.5 3.8 3.9  CL 106 104 106 104 107  CO2 21* 21* 21* 22 22  GLUCOSE 117* 131* 111* 93 95  BUN 16 14 13  30* 38*  CREATININE 0.60 0.54 0.61 0.74 0.63  CALCIUM 9.6 9.5 8.7* 9.1 9.6  MG  --   --   --  1.7 2.0   Liver Function Tests: Recent Labs  Lab 10/03/19 2055  AST 19  ALT 19  ALKPHOS 56  BILITOT 0.6  PROT 6.6  ALBUMIN 3.7   No results for input(s): LIPASE, AMYLASE in the last 168 hours. No results for input(s): AMMONIA in the last 168 hours. CBC: Recent Labs  Lab 10/03/19 2055 10/04/19 0439 10/05/19 0731 10/06/19 0644 10/07/19 0457  WBC 11.9* 12.2* 12.9* 11.4* 10.6*  NEUTROABS 9.9*  --   --   --   --   HGB 12.3 12.7 9.9* 8.9* 8.6*  HCT 38.8 38.7 31.6* 28.7* 27.6*  MCV 99.2 97.0 101.3* 102.1* 101.8*  PLT 361 381 264 229 275   Cardiac Enzymes: No results for input(s): CKTOTAL, CKMB, CKMBINDEX, TROPONINI in the last 168 hours. BNP: Invalid input(s): POCBNP CBG: No results for input(s): GLUCAP in the last 168 hours. D-Dimer No results for input(s): DDIMER in the last 72 hours. Hgb A1c No results for input(s): HGBA1C in the last  72 hours. Lipid Profile No results for input(s): CHOL, HDL, LDLCALC, TRIG, CHOLHDL, LDLDIRECT in the last 72 hours. Thyroid function studies No results for input(s): TSH, T4TOTAL, T3FREE, THYROIDAB in the last  72 hours.  Invalid input(s): FREET3 Anemia work up No results for input(s): VITAMINB12, FOLATE, FERRITIN, TIBC, IRON, RETICCTPCT in the last 72 hours. Urinalysis    Component Value Date/Time   COLORURINE YELLOW 10/04/2019 0130   APPEARANCEUR CLEAR 10/04/2019 0130   LABSPEC 1.010 10/04/2019 0130   PHURINE 7.0 10/04/2019 0130   GLUCOSEU NEGATIVE 10/04/2019 0130   HGBUR NEGATIVE 10/04/2019 0130   BILIRUBINUR NEGATIVE 10/04/2019 0130   KETONESUR NEGATIVE 10/04/2019 0130   PROTEINUR NEGATIVE 10/04/2019 0130   NITRITE NEGATIVE 10/04/2019 0130   LEUKOCYTESUR NEGATIVE 10/04/2019 0130   Sepsis Labs Invalid input(s): PROCALCITONIN,  WBC,  LACTICIDVEN Microbiology Recent Results (from the past 240 hour(s))  Respiratory Panel by RT PCR (Flu A&B, Covid) - Nasopharyngeal Swab     Status: None   Collection Time: 10/03/19 10:23 PM   Specimen: Nasopharyngeal Swab  Result Value Ref Range Status   SARS Coronavirus 2 by RT PCR NEGATIVE NEGATIVE Final    Comment: (NOTE) SARS-CoV-2 target nucleic acids are NOT DETECTED. The SARS-CoV-2 RNA is generally detectable in upper respiratoy specimens during the acute phase of infection. The lowest concentration of SARS-CoV-2 viral copies this assay can detect is 131 copies/mL. A negative result does not preclude SARS-Cov-2 infection and should not be used as the sole basis for treatment or other patient management decisions. A negative result may occur with  improper specimen collection/handling, submission of specimen other than nasopharyngeal swab, presence of viral mutation(s) within the areas targeted by this assay, and inadequate number of viral copies (<131 copies/mL). A negative result must be combined with clinical observations, patient  history, and epidemiological information. The expected result is Negative. Fact Sheet for Patients:  PinkCheek.be Fact Sheet for Healthcare Providers:  GravelBags.it This test is not yet ap proved or cleared by the [place] FDA and  has been authorized for detection and/or diagnosis of SARS-CoV-2 by FDA under an Emergency Use Authorization (EUA). This EUA will remain  in effect (meaning this test can be used) for the duration of the COVID-19 declaration under Section 564(b)(1) of the Act, 21 U.S.C. section 360bbb-3(b)(1), unless the authorization is terminated or revoked sooner.    Influenza A by PCR NEGATIVE NEGATIVE Final   Influenza B by PCR NEGATIVE NEGATIVE Final    Comment: (NOTE) The Xpert Xpress SARS-CoV-2/FLU/RSV assay is intended as an aid in  the diagnosis of influenza from Nasopharyngeal swab specimens and  should not be used as a sole basis for treatment. Nasal washings and  aspirates are unacceptable for Xpert Xpress SARS-CoV-2/FLU/RSV  testing. Fact Sheet for Patients: PinkCheek.be Fact Sheet for Healthcare Providers: GravelBags.it This test is not yet approved or cleared by the [place] FDA and  has been authorized for detection and/or diagnosis of SARS-CoV-2 by  FDA under an Emergency Use Authorization (EUA). This EUA will remain  in effect (meaning this test can be used) for the duration of the  Covid-19 declaration under Section 564(b)(1) of the Act, 21  U.S.C. section 360bbb-3(b)(1), unless the authorization is  terminated or revoked. Performed at [facility], [address], [place], [place] [address]   Surgical PCR screen     Status: None   Collection Time: 10/04/19  1:23 AM   Specimen: Nasal Mucosa; Nasal Swab  Result Value Ref Range Status   MRSA, PCR NEGATIVE NEGATIVE Final   Staphylococcus aureus NEGATIVE NEGATIVE Final     Comment: (NOTE) The Xpert SA Assay (FDA approved for NASAL specimens in patients 79 years of age  and older), is one component of a comprehensive surveillance program. It is not intended to diagnose infection nor to guide or monitor treatment. Performed at [facility], [address], [place], [place] [address]      Time coordinating discharge: 35 minutes  SIGNED:   Rodena Goldmann, DO [facility] 10/07/2019, 10:38 AM  If 7PM-7AM, please contact night-coverage [email or web address]

## 2019-10-07 NOTE — Care Management (Signed)
Bed offer from [facility]. Family elects [facility]. Marianna Fuss updated and will start insurance authorization.

## 2019-10-07 NOTE — Addendum Note (Signed)
Addendum  created 10/07/19 1457 by Ollen Bowl, CRNA   Charge Capture section accepted

## 2019-10-08 ENCOUNTER — Inpatient Hospital Stay
Admission: RE | Admit: 2019-10-08 | Discharge: 2019-11-03 | Disposition: A | Discharge status: Home / Self Care | Payer: Medicare Other | Source: Ambulatory Visit | Attending: Internal Medicine | Admitting: Internal Medicine

## 2019-10-08 DIAGNOSIS — R262 Difficulty in walking, not elsewhere classified: Secondary | ICD-10-CM | POA: Diagnosis not present

## 2019-10-08 DIAGNOSIS — K449 Diaphragmatic hernia without obstruction or gangrene: Secondary | ICD-10-CM | POA: Diagnosis not present

## 2019-10-08 DIAGNOSIS — R600 Localized edema: Secondary | ICD-10-CM | POA: Diagnosis not present

## 2019-10-08 DIAGNOSIS — M545 Low back pain: Secondary | ICD-10-CM | POA: Diagnosis not present

## 2019-10-08 DIAGNOSIS — E890 Postprocedural hypothyroidism: Secondary | ICD-10-CM | POA: Diagnosis not present

## 2019-10-08 DIAGNOSIS — M6281 Muscle weakness (generalized): Secondary | ICD-10-CM | POA: Diagnosis not present

## 2019-10-08 DIAGNOSIS — Z9181 History of falling: Secondary | ICD-10-CM | POA: Diagnosis not present

## 2019-10-08 DIAGNOSIS — I10 Essential (primary) hypertension: Secondary | ICD-10-CM | POA: Diagnosis not present

## 2019-10-08 DIAGNOSIS — Z741 Need for assistance with personal care: Secondary | ICD-10-CM | POA: Diagnosis not present

## 2019-10-08 DIAGNOSIS — K219 Gastro-esophageal reflux disease without esophagitis: Secondary | ICD-10-CM | POA: Diagnosis not present

## 2019-10-08 DIAGNOSIS — F339 Major depressive disorder, recurrent, unspecified: Secondary | ICD-10-CM | POA: Diagnosis not present

## 2019-10-08 DIAGNOSIS — Z96642 Presence of left artificial hip joint: Secondary | ICD-10-CM | POA: Diagnosis not present

## 2019-10-08 DIAGNOSIS — J449 Chronic obstructive pulmonary disease, unspecified: Secondary | ICD-10-CM | POA: Diagnosis not present

## 2019-10-08 DIAGNOSIS — G8929 Other chronic pain: Secondary | ICD-10-CM | POA: Diagnosis not present

## 2019-10-08 DIAGNOSIS — S72002D Fracture of unspecified part of neck of left femur, subsequent encounter for closed fracture with routine healing: Secondary | ICD-10-CM | POA: Diagnosis not present

## 2019-10-08 DIAGNOSIS — H40053 Ocular hypertension, bilateral: Secondary | ICD-10-CM | POA: Diagnosis not present

## 2019-10-08 DIAGNOSIS — J4540 Moderate persistent asthma, uncomplicated: Secondary | ICD-10-CM | POA: Diagnosis not present

## 2019-10-08 DIAGNOSIS — S72002A Fracture of unspecified part of neck of left femur, initial encounter for closed fracture: Secondary | ICD-10-CM | POA: Diagnosis not present

## 2019-10-08 DIAGNOSIS — Z471 Aftercare following joint replacement surgery: Secondary | ICD-10-CM | POA: Diagnosis not present

## 2019-10-08 LAB — TYPE AND SCREEN
ABO/RH(D): O POS
Antibody Screen: NEGATIVE
Unit division: 0
Unit division: 0

## 2019-10-08 LAB — BPAM RBC
Blood Product Expiration Date: 202103312359
Blood Product Expiration Date: 202103312359
Unit Type and Rh: 5100
Unit Type and Rh: 5100

## 2019-10-08 NOTE — Progress Notes (Signed)
Physical Therapy Treatment Patient Details Name: Lindsey Butler MRN: [id number] DOB: [birth year] Today's Date: 10/08/2019    History of Present Illness Lindsey Butler is a 86 y.o. female with medical history significant of hypertension, hypothyroidism, depression, GERD, asthma who presented to the ER with a fall at home with left hip pain.  Patient had a mechanical fall at home around 1:30 PM when she was walking down the hall and says her left knee gave way it caused her to fall to the side.  No head injury, no loss of consciousness.  Unable to get up without assistance.  Found to have a left femoral neck fracture on admission.  Case discussed with orthopedic surgeon on call will plan for intervention in a.m.    PT Comments    Patient requires assistance to move LLE during bed mobility and frequent verbal/tactile cueing for proper hand placement during supine to sitting with fair carryover demonstrated, tolerated standing with RW for up to 4-5 minutes while having brief put on, incontinent of urine when standing, and limited to a few unsteady labored steps at bedside due to weakness and left hip pain.  Patient tolerated sitting up in chair after therapy.  Patient will benefit from continued physical therapy in hospital and recommended venue below to increase strength, balance, endurance for safe ADLs and gait.   Follow Up Recommendations  SNF     Equipment Recommendations  None recommended by PT    Recommendations for Other Services       Precautions / Restrictions Precautions Precautions: Fall Restrictions Weight Bearing Restrictions: Yes LLE Weight Bearing: Weight bearing as tolerated    Mobility  Bed Mobility Overal bed mobility: Needs Assistance Bed Mobility: Supine to Sit     Supine to sit: Mod assist     General bed mobility comments: slow labored movement requiring frequent verbal cueing for proper hand placement  Transfers Overall transfer level: Needs  assistance Equipment used: Rolling walker (2 wheeled) Transfers: Sit to/from [facility] Sit to Stand: Mod assist;Max assist Stand pivot transfers: Mod assist;Max assist       General transfer comment: difficulty with sit to stands due to BLE weakness  Ambulation/Gait Ambulation/Gait assistance: Max assist Gait Distance (Feet): 4 Feet Assistive device: Rolling walker (2 wheeled) Gait Pattern/deviations: Decreased stance time - left;Decreased step length - right;Decreased step length - left;Antalgic Gait velocity: slow   General Gait Details: limited to 4-5 very unsteady labored side steps with buckling of knees due to weakness and increasing left hip pain   Stairs             Wheelchair Mobility    Modified Rankin (Stroke Patients Only)       Balance Overall balance assessment: Needs assistance Sitting-balance support: Feet supported;No upper extremity supported Sitting balance-Leahy Scale: Fair Sitting balance - Comments: seated at EOB   Standing balance support: During functional activity;Bilateral upper extremity supported Standing balance-Leahy Scale: Poor Standing balance comment: using RW                            Cognition Arousal/Alertness: Awake/alert Behavior During Therapy: WFL for tasks assessed/performed Overall Cognitive Status: Within Functional Limits for tasks assessed                                        Exercises General Exercises - Lower Extremity  Hip ABduction/ADduction: Seated;AROM;Strengthening;Both;10 reps Hip Flexion/Marching: Seated;AROM;Strengthening;Both;10 reps Toe Raises: Seated;AROM;Strengthening;Both;10 reps Heel Raises: Seated;AROM;Strengthening;Both;10 reps    General Comments        Pertinent Vitals/Pain Pain Assessment: Faces Faces Pain Scale: Hurts even more Pain Location: left hip with  movement Pain Descriptors / Indicators:  Aching;Sharp;Sore;Grimacing;Guarding Pain Intervention(s): Limited activity within patient's tolerance;Monitored during session;Premedicated before session    Home Living                      Prior Function            PT Goals (current goals can now be found in the care plan section) Acute Rehab PT Goals Patient Stated Goal: Return home PT Goal Formulation: With patient Time For Goal Achievement: 10/18/19 Potential to Achieve Goals: Good Progress towards PT goals: Progressing toward goals    Frequency    Min 6X/week      PT Plan Current plan remains appropriate    Co-evaluation              AM-PAC PT "6 Clicks" Mobility   Outcome Measure  Help needed turning from your back to your side while in a flat bed without using bedrails?: A Lot Help needed moving from lying on your back to sitting on the side of a flat bed without using bedrails?: A Lot Help needed moving to and from a bed to a chair (including a wheelchair)?: A Lot Help needed standing up from a chair using your arms (e.g., wheelchair or bedside chair)?: A Lot Help needed to walk in hospital room?: A Lot Help needed climbing 3-5 steps with a railing? : Total 6 Click Score: 11    End of Session   Activity Tolerance: Patient tolerated treatment well;Patient limited by fatigue;Patient limited by pain Patient left: with call bell/phone within reach;in bed Nurse Communication: Mobility status PT Visit Diagnosis: Unsteadiness on feet (R26.81);Other abnormalities of gait and mobility (R26.89);Muscle weakness (generalized) (M62.81)     Time: [id number] PT Time Calculation (min) (ACUTE ONLY): 35 min  Charges:  $Therapeutic Exercise: 8-22 mins $Therapeutic Activity: 8-22 mins                     12:09 PM, 10/08/19 Lonell Grandchild, MPT Physical Therapist with [facility] [phone] office [phone] mobile phone

## 2019-10-08 NOTE — TOC Transition Note (Signed)
Transition of Care [facility]) - CM/SW Discharge Note   Patient Details  Name: Lindsey Butler MRN: [id number] Date of Birth: 1929-03-22  Transition of Care [facility]) CM/SW Contact:  Ihor Gully, LCSW Phone Number: 10/08/2019, 12:34 PM   Clinical Narrative:    Marianna Fuss at [facility] has auth. Patient admitting today to ROOM 157. RN to call report to [phone]. Patient's DIL, Kristal, at bedside, advised of discharge.  TOC signing off.    Final next level of care: [place]     Patient Goals and CMS Choice        Discharge Placement                       Discharge Plan and Services                                     Social Determinants of Health (SDOH) Interventions     Readmission Risk Interventions No flowsheet data found.

## 2019-10-08 NOTE — Progress Notes (Signed)
Patient seen and evaluated this morning with no acute complaints or concerns noted overnight.  Please refer to discharge summary dictated 10/07/2019 for full details.  Total care time: 15 minutes.

## 2019-10-08 NOTE — Progress Notes (Signed)
Report called and given to Maudry Diego at [facility]. IV removed. Patient notified of transfer and is agreeable. PNC to transport, will continue to monitor.

## 2019-10-09 ENCOUNTER — Non-Acute Institutional Stay (SKILLED_NURSING_FACILITY): Payer: Medicare Other | Admitting: Adult Health

## 2019-10-09 DIAGNOSIS — F339 Major depressive disorder, recurrent, unspecified: Secondary | ICD-10-CM

## 2019-10-09 DIAGNOSIS — S72002D Fracture of unspecified part of neck of left femur, subsequent encounter for closed fracture with routine healing: Secondary | ICD-10-CM | POA: Diagnosis not present

## 2019-10-09 DIAGNOSIS — I10 Essential (primary) hypertension: Secondary | ICD-10-CM | POA: Diagnosis not present

## 2019-10-09 DIAGNOSIS — K449 Diaphragmatic hernia without obstruction or gangrene: Secondary | ICD-10-CM

## 2019-10-09 DIAGNOSIS — J4540 Moderate persistent asthma, uncomplicated: Secondary | ICD-10-CM | POA: Diagnosis not present

## 2019-10-09 DIAGNOSIS — H40053 Ocular hypertension, bilateral: Secondary | ICD-10-CM

## 2019-10-09 DIAGNOSIS — R600 Localized edema: Secondary | ICD-10-CM | POA: Diagnosis not present

## 2019-10-09 DIAGNOSIS — E890 Postprocedural hypothyroidism: Secondary | ICD-10-CM | POA: Diagnosis not present

## 2019-10-09 DIAGNOSIS — K219 Gastro-esophageal reflux disease without esophagitis: Secondary | ICD-10-CM

## 2019-10-10 ENCOUNTER — Non-Acute Institutional Stay (SKILLED_NURSING_FACILITY): Payer: Medicare Other | Admitting: Adult Health

## 2019-10-10 ENCOUNTER — Encounter: Payer: Self-pay | Admitting: Adult Health

## 2019-10-10 DIAGNOSIS — S72002D Fracture of unspecified part of neck of left femur, subsequent encounter for closed fracture with routine healing: Secondary | ICD-10-CM

## 2019-10-10 DIAGNOSIS — F339 Major depressive disorder, recurrent, unspecified: Secondary | ICD-10-CM | POA: Diagnosis not present

## 2019-10-10 DIAGNOSIS — I10 Essential (primary) hypertension: Secondary | ICD-10-CM | POA: Diagnosis not present

## 2019-10-10 NOTE — Progress Notes (Signed)
Location:    [place] Room Number: 157/D Place of Service:  SNF (31)   CODE STATUS: DNR  No Known Allergies  Chief Complaint  Patient presents with  . Acute Visit    72-hour Care Meeting    HPI:  We have come together for her 62 hour care plan meeting. Her goal is to return back home. She lives with her son and daughter in [place]. Has 3 steps to get into house; is one level. She has a walker at home; will not need dme at this time. There are no reports of uncontrolled pain; no reports of insomnia. Does have a poor appetite. She continues to be followed for her chronic illnesses including: depression hypertension left femoral neck fracture.   Past Medical History:  Diagnosis Date  . Actinic keratosis   . Asthma   . GERD (gastroesophageal reflux disease)   . Hyperlipidemia   . Hypothyroidism   . Osteoarthritis     Past Surgical History:  Procedure Laterality Date  . CATARACT EXTRACTION     left and right  . HIP ARTHROPLASTY Left 10/04/2019   Procedure: ARTHROPLASTY BIPOLAR HIP (HEMIARTHROPLASTY);  Surgeon: Carole Civil, MD;  Location: [facility];  Service: Orthopedics;  Laterality: Left;  . THYROIDECTOMY    . TONSILLECTOMY    . TOTAL KNEE ARTHROPLASTY     right and left knee  . VAGINAL HYSTERECTOMY      Social History   Socioeconomic History  . Marital status: Married    Spouse name: Not on file  . Number of children: Not on file  . Years of education: Not on file  . Highest education level: Not on file  Occupational History  . Occupation: retired  Tobacco Use  . Smoking status: Never Smoker  . Smokeless tobacco: Never Used  Substance and Sexual Activity  . Alcohol use: No  . Drug use: No  . Sexual activity: Not on file  Other Topics Concern  . Not on file  Social History Narrative  . Not on file   Social Determinants of Health   Financial Resource Strain:   . Difficulty of Paying Living Expenses: Not on file  Food Insecurity:    . Worried About Charity fundraiser in the Last Year: Not on file  . Ran Out of Food in the Last Year: Not on file  Transportation Needs:   . Lack of Transportation (Medical): Not on file  . Lack of Transportation (Non-Medical): Not on file  Physical Activity:   . Days of Exercise per Week: Not on file  . Minutes of Exercise per Session: Not on file  Stress:   . Feeling of Stress : Not on file  Social Connections:   . Frequency of Communication with Friends and Family: Not on file  . Frequency of Social Gatherings with Friends and Family: Not on file  . Attends Religious Services: Not on file  . Active Member of Clubs or Organizations: Not on file  . Attends Archivist Meetings: Not on file  . Marital Status: Not on file  Intimate Partner Violence:   . Fear of Current or Ex-Partner: Not on file  . Emotionally Abused: Not on file  . Physically Abused: Not on file  . Sexually Abused: Not on file   Family History  Problem Relation Age of Onset  . Cancer Son   . Liver cancer Brother       VITAL SIGNS BP (!) 120/55  Pulse 99   Temp 99.2 F (37.3 C) (Oral)   Resp 20   Ht 5\' 6"  (1.676 m)   Wt 138 lb 7.2 oz (62.8 kg)   SpO2 97%   BMI 22.35 kg/m   Outpatient Encounter Medications as of 10/10/2019  Medication Sig  . acetaZOLAMIDE (DIAMOX) 250 MG tablet Take 250 mg by mouth 2 (two) times daily.  Marland Kitchen albuterol (VENTOLIN HFA) 108 (90 Base) MCG/ACT inhaler Inhale 2 puffs into the lungs every 4 (four) hours as needed for wheezing or shortness of breath.   Marland Kitchen aspirin EC 325 MG EC tablet Take 1 tablet (325 mg total) by mouth daily with breakfast.  . budesonide (PULMICORT) 0.5 MG/2ML nebulizer solution Take 0.5 mg by nebulization 2 (two) times daily.  Marland Kitchen docusate sodium (COLACE) 100 MG capsule Take 1 capsule (100 mg total) by mouth 2 (two) times daily.  . feeding supplement, ENSURE ENLIVE, (ENSURE ENLIVE) LIQD Take 237 mLs by mouth 2 (two) times daily between meals.  .  furosemide (LASIX) 40 MG tablet Take 40 mg by mouth daily.   Marland Kitchen HYDROcodone-acetaminophen (NORCO/VICODIN) 5-325 MG tablet Take 1 tablet by mouth every 6 (six) hours as needed for moderate pain or severe pain.  Marland Kitchen ipratropium-albuterol (DUONEB) 0.5-2.5 (3) MG/3ML SOLN Take 3 mLs by nebulization every 6 (six) hours as needed (for sohrtness of breath).   Marland Kitchen levothyroxine (SYNTHROID) 125 MCG tablet Take 125 mcg by mouth every morning.  . loratadine (CLARITIN) 10 MG tablet Take 10 mg by mouth daily.  Marland Kitchen losartan (COZAAR) 50 MG tablet Take 50 mg by mouth daily.  . montelukast (SINGULAIR) 10 MG tablet Take 1 tablet by mouth Daily.  . Multiple Vitamin (MULTIVITAMIN WITH MINERALS) TABS tablet Take 1 tablet by mouth daily.  Marland Kitchen NEXIUM 40 MG capsule Take 1 tablet by mouth Daily.  . NON FORMULARY Diet: _____ Regular,  __x____ NAS,  _______Consistent Carbohydrate,  _______NPO  _____Other  . nutrition supplement, JUVEN, (JUVEN) PACK Take 1 packet by mouth 2 (two) times daily between meals.  . potassium chloride SA (KLOR-CON) 20 MEQ tablet Take 1 tablet (20 mEq total) by mouth daily.  . predniSONE (DELTASONE) 5 MG tablet Take 5 mg by mouth daily.   . sertraline (ZOLOFT) 25 MG tablet Take 25 mg by mouth daily.   No facility-administered encounter medications on file as of 10/10/2019.     SIGNIFICANT DIAGNOSTIC EXAMS  PREVIOUS  10-03-19: chest x-ray: Cardiomegaly with large hiatal hernia. No evidence for acute disease or significant interval change from prior.  10-03-19: left hip x-ray: 1. Impacted subcapital left femoral neck fracture.   NO NEW EXAMS.   LABS REVIEWED PREVIOUS   10-03-19: wbc 11.9 hgb 12.3; hct 38.8; mcv 99.2 plt 361; glucose 117; bun 16; creat 0.60 ;k+ 3.6; na++ 135; ca 9.6 liver normal albumin 3.7 10-07-19: wbc 10.6; hgb 8.6; hct 27.6; mcv 101.8 plt 275 glucose 95; bun 38; creat 0.63 ;k+ 3.9; na++ 136; ca 9.6 mag 2.0   NO NEW LABS.   Review of Systems  Constitutional: Negative for  malaise/fatigue.  Respiratory: Negative for cough and shortness of breath.   Cardiovascular: Negative for chest pain, palpitations and leg swelling.  Gastrointestinal: Negative for abdominal pain, constipation and heartburn.  Musculoskeletal: Negative for back pain, joint pain and myalgias.  Skin: Negative.   Neurological: Negative for dizziness.  Psychiatric/Behavioral: The patient is not nervous/anxious.     Physical Exam Constitutional:      General: She is not in acute distress.  Appearance: She is well-developed. She is not diaphoretic.  Eyes:     Comments: Bilateral cataract removal   Neck:     Comments: History thyroidectomy  Cardiovascular:     Rate and Rhythm: Normal rate and regular rhythm.     Pulses: Normal pulses.     Heart sounds: Normal heart sounds.  Pulmonary:     Effort: Pulmonary effort is normal. No respiratory distress.     Breath sounds: Normal breath sounds.  Abdominal:     General: Bowel sounds are normal. There is no distension.     Palpations: Abdomen is soft.     Tenderness: There is no abdominal tenderness.  Musculoskeletal:     Cervical back: Neck supple.     Right lower leg: No edema.     Left lower leg: No edema.     Comments:  Is able to move all extremities Left hip arthroplasty 10-04-19 History of bilateral knee replacements    Lymphadenopathy:     Cervical: No cervical adenopathy.  Skin:    General: Skin is warm and dry.  Neurological:     Mental Status: She is alert. Mental status is at baseline.  Psychiatric:        Mood and Affect: Mood normal.      ASSESSMENT/ PLAN:  TODAY  1. Essential hypertension 2. Major depression chronic recurrent 3. Closed fracture of neck of left femur subsequent encounter with routine healing  Will continue current medications Will continue therapy as directed Goal is to return back home At this time no dme will be needed.  Will monitor her status.    MD is aware of resident's narcotic use  and is in agreement with current plan of care. We will attempt to wean resident as appropriate.  Ok Edwards NP [facility]  Contact [phone] Monday through Friday 8am- 5pm  After hours call [phone]

## 2019-10-13 ENCOUNTER — Encounter: Payer: Self-pay | Admitting: Adult Health

## 2019-10-13 DIAGNOSIS — R600 Localized edema: Secondary | ICD-10-CM | POA: Insufficient documentation

## 2019-10-13 DIAGNOSIS — K219 Gastro-esophageal reflux disease without esophagitis: Secondary | ICD-10-CM | POA: Insufficient documentation

## 2019-10-13 DIAGNOSIS — F339 Major depressive disorder, recurrent, unspecified: Secondary | ICD-10-CM | POA: Insufficient documentation

## 2019-10-13 DIAGNOSIS — E890 Postprocedural hypothyroidism: Secondary | ICD-10-CM | POA: Insufficient documentation

## 2019-10-13 DIAGNOSIS — H40053 Ocular hypertension, bilateral: Secondary | ICD-10-CM | POA: Insufficient documentation

## 2019-10-13 DIAGNOSIS — I10 Essential (primary) hypertension: Secondary | ICD-10-CM | POA: Insufficient documentation

## 2019-10-13 NOTE — Progress Notes (Signed)
Location:   [facility] Room Number: 111 Place of Service:  SNF (31)   CODE STATUS: full code   No Known Allergies  Chief Complaint  Patient presents with  . Hospitalization Follow-up    HPI:  She is a 86 year old woman who has been hospitalized for a left femur neck fracture. She is here for short term rehab with her goal to return back home. There are no reports of uncontrolled pain; no reports of constipation; no reports of anxiety or depressive thoughts. She will continue to be followed for her chronic illnesses including: edema; asthma; hypertension.   Past Medical History:  Diagnosis Date  . Actinic keratosis   . Asthma   . GERD (gastroesophageal reflux disease)   . Hyperlipidemia   . Hypothyroidism   . Osteoarthritis     Past Surgical History:  Procedure Laterality Date  . CATARACT EXTRACTION     left and right  . HIP ARTHROPLASTY Left 10/04/2019   Procedure: ARTHROPLASTY BIPOLAR HIP (HEMIARTHROPLASTY);  Surgeon: Carole Civil, MD;  Location: [facility];  Service: Orthopedics;  Laterality: Left;  . THYROIDECTOMY    . TONSILLECTOMY    . TOTAL KNEE ARTHROPLASTY     right and left knee  . VAGINAL HYSTERECTOMY      Social History   Socioeconomic History  . Marital status: Married    Spouse name: Not on file  . Number of children: Not on file  . Years of education: Not on file  . Highest education level: Not on file  Occupational History  . Occupation: retired  Tobacco Use  . Smoking status: Never Smoker  . Smokeless tobacco: Never Used  Substance and Sexual Activity  . Alcohol use: No  . Drug use: No  . Sexual activity: Not on file  Other Topics Concern  . Not on file  Social History Narrative  . Not on file   Social Determinants of Health   Financial Resource Strain:   . Difficulty of Paying Living Expenses: Not on file  Food Insecurity:   . Worried About Charity fundraiser in the Last Year: Not on file  . Ran Out of Food  in the Last Year: Not on file  Transportation Needs:   . Lack of Transportation (Medical): Not on file  . Lack of Transportation (Non-Medical): Not on file  Physical Activity:   . Days of Exercise per Week: Not on file  . Minutes of Exercise per Session: Not on file  Stress:   . Feeling of Stress : Not on file  Social Connections:   . Frequency of Communication with Friends and Family: Not on file  . Frequency of Social Gatherings with Friends and Family: Not on file  . Attends Religious Services: Not on file  . Active Member of Clubs or Organizations: Not on file  . Attends Archivist Meetings: Not on file  . Marital Status: Not on file  Intimate Partner Violence:   . Fear of Current or Ex-Partner: Not on file  . Emotionally Abused: Not on file  . Physically Abused: Not on file  . Sexually Abused: Not on file   Family History  Problem Relation Age of Onset  . Cancer Son   . Liver cancer Brother       VITAL SIGNS BP 116/71   Pulse 78   Temp 97.9 F (36.6 C)   Resp 16   Ht 5\' 6"  (1.676 m)   Wt  138 lb (62.6 kg)   SpO2 95%   BMI 22.27 kg/m   Outpatient Encounter Medications as of 10/09/2019  Medication Sig  . acetaZOLAMIDE (DIAMOX) 250 MG tablet Take 250 mg by mouth 2 (two) times daily.  Marland Kitchen albuterol (VENTOLIN HFA) 108 (90 Base) MCG/ACT inhaler Inhale 2 puffs into the lungs every 4 (four) hours as needed for wheezing or shortness of breath.   Marland Kitchen aspirin EC 325 MG EC tablet Take 1 tablet (325 mg total) by mouth daily with breakfast.  . budesonide (PULMICORT) 0.5 MG/2ML nebulizer solution Take 0.5 mg by nebulization 2 (two) times daily.  Marland Kitchen docusate sodium (COLACE) 100 MG capsule Take 1 capsule (100 mg total) by mouth 2 (two) times daily.  . feeding supplement, ENSURE ENLIVE, (ENSURE ENLIVE) LIQD Take 237 mLs by mouth 2 (two) times daily between meals.  . furosemide (LASIX) 40 MG tablet Take 40 mg by mouth daily.   Marland Kitchen HYDROcodone-acetaminophen (NORCO/VICODIN) 5-325  MG tablet Take 1 tablet by mouth every 6 (six) hours as needed for moderate pain or severe pain.  Marland Kitchen ipratropium-albuterol (DUONEB) 0.5-2.5 (3) MG/3ML SOLN Take 3 mLs by nebulization every 6 (six) hours as needed (for sohrtness of breath).   Marland Kitchen levothyroxine (SYNTHROID) 125 MCG tablet Take 125 mcg by mouth every morning.  . loratadine (CLARITIN) 10 MG tablet Take 10 mg by mouth daily.  Marland Kitchen losartan (COZAAR) 50 MG tablet Take 50 mg by mouth daily.  . montelukast (SINGULAIR) 10 MG tablet Take 1 tablet by mouth Daily.  . Multiple Vitamin (MULTIVITAMIN WITH MINERALS) TABS tablet Take 1 tablet by mouth daily.  Marland Kitchen NEXIUM 40 MG capsule Take 1 tablet by mouth Daily.  . nutrition supplement, JUVEN, (JUVEN) PACK Take 1 packet by mouth 2 (two) times daily between meals.  . potassium chloride SA (KLOR-CON) 20 MEQ tablet Take 1 tablet (20 mEq total) by mouth daily.  . predniSONE (DELTASONE) 5 MG tablet Take 5 mg by mouth daily.   . sertraline (ZOLOFT) 25 MG tablet Take 25 mg by mouth daily.   No facility-administered encounter medications on file as of 10/09/2019.     SIGNIFICANT DIAGNOSTIC EXAMS  TODAY  10-03-19: chest x-ray: Cardiomegaly with large hiatal hernia. No evidence for acute disease or significant interval change from prior.  10-03-19: left hip x-ray: 1. Impacted subcapital left femoral neck fracture.   LABS REVIEWED TODAY;   10-03-19: wbc 11.9 hgb 12.3; hct 38.8; mcv 99.2 plt 361; glucose 117; bun 16; creat 0.60 ;k+ 3.6; na++ 135; ca 9.6 liver normal albumin 3.7 10-07-19: wbc 10.6; hgb 8.6; hct 27.6; mcv 101.8 plt 275 glucose 95; bun 38; creat 0.63 ;k+ 3.9; na++ 136; ca 9.6 mag 2.0   Review of Systems  Constitutional: Negative for malaise/fatigue.  Respiratory: Negative for cough and shortness of breath.   Cardiovascular: Negative for chest pain, palpitations and leg swelling.  Gastrointestinal: Negative for abdominal pain, constipation and heartburn.  Musculoskeletal: Negative for back pain,  joint pain and myalgias.  Skin: Negative.   Neurological: Negative for dizziness.  Psychiatric/Behavioral: The patient is not nervous/anxious.    Physical Exam Constitutional:      General: She is not in acute distress.    Appearance: She is well-developed. She is not diaphoretic.  Eyes:     Comments: Bilateral cataract removal   Neck:     Comments: History thyroidectomy  Cardiovascular:     Rate and Rhythm: Normal rate and regular rhythm.     Pulses: Normal pulses.  Heart sounds: Normal heart sounds.  Pulmonary:     Effort: Pulmonary effort is normal. No respiratory distress.     Breath sounds: Normal breath sounds.  Abdominal:     General: Bowel sounds are normal. There is no distension.     Palpations: Abdomen is soft.     Tenderness: There is no abdominal tenderness.  Musculoskeletal:     Cervical back: Neck supple.     Right lower leg: No edema.     Left lower leg: No edema.     Comments: Is able to move all extremities Left hip arthroplasty 10-04-19 History of bilateral knee replacements   Lymphadenopathy:     Cervical: No cervical adenopathy.  Skin:    General: Skin is warm and dry.  Neurological:     Mental Status: She is alert. Mental status is at baseline.  Psychiatric:        Mood and Affect: Mood normal.       ASSESSMENT/ PLAN:  TODAY  1. Closed fracture of neck of left femur with routine healing subsequent healing.: is stable will continue therapy as directed will follow up with orthopedics. Will continue vicodin 5/325 mg every 6 hours as needed through 10-15-19.   2. post operative hypothyroidism: is stable will continue synthroid 125 mcg daily   3. Moderate persistent asthma without complications: is stable will continue albuterol 2 puffs every 4 hours as needed; pulmicort neb 0.5 mg twice daily claritin 10 mg daily singular 10 mg daily prednisone 5 mg daily duoneb every 6 hours as needed  4. Bilateral lower extremity edema: is stable will continue  lasix 40 mg daily with k+ 20 meq daily   5. Essential hypertension: is stable b/p 116/71 will continue cozaar 50 mg daily   6. Major depression recurrent chronic: is stable will continue zoloft 25 mg daily   7. Hiatal hernia with GERD without esophagitis: is stable will continue nexium 40 mg daily   8. Increased intraocular pressure bilateral: will continue diamox 250 mg twice daily   9. Chronic constipation: is stable will continue colace twice daily       MD is aware of resident's narcotic use and is in agreement with current plan of care. We will attempt to wean resident as appropriate.  Ok Edwards NP [facility]  Contact [phone] Monday through Friday 8am- 5pm  After hours call [phone]

## 2019-10-14 ENCOUNTER — Other Ambulatory Visit: Payer: Self-pay | Admitting: Adult Health

## 2019-10-14 MED ORDER — HYDROCODONE-ACETAMINOPHEN 5-325 MG PO TABS: 1.0000 | ORAL_TABLET | Freq: Four times a day (QID) | ORAL | 0 refills | Status: DC | PRN

## 2019-10-15 ENCOUNTER — Non-Acute Institutional Stay (SKILLED_NURSING_FACILITY): Payer: Medicare Other | Admitting: Adult Health

## 2019-10-15 ENCOUNTER — Encounter: Payer: Self-pay | Admitting: Adult Health

## 2019-10-15 DIAGNOSIS — S72002D Fracture of unspecified part of neck of left femur, subsequent encounter for closed fracture with routine healing: Secondary | ICD-10-CM

## 2019-10-15 DIAGNOSIS — E890 Postprocedural hypothyroidism: Secondary | ICD-10-CM | POA: Diagnosis not present

## 2019-10-15 DIAGNOSIS — J4540 Moderate persistent asthma, uncomplicated: Secondary | ICD-10-CM

## 2019-10-15 NOTE — Progress Notes (Signed)
Location:    [place] Room Number: 157/D Place of Service:  SNF (31)   CODE STATUS: DNR  No Known Allergies  Chief Complaint  Patient presents with  . Medical Management of Chronic Issues        Closed fracture of neck of left femur with routine healing subsequent encounter:   Post operative hypothyroidism:   Moderate persistent asthma without complication:   Weekly follow up for the first 30 days post hospitalization.     HPI:  [name] eis a 86 year old short term rehab patient being seen for the management of her chronic illnesses: left femur fracture; hypothyroidism; asthma. There are no reports of uncontrolled pain. Lindsey Butler states that Lindsey Butler has multiple joint pain including back shoulders elbows and knees. No reports of changes in appetite; no reports of anxiety or agitation.   Past Medical History:  Diagnosis Date  . Actinic keratosis   . Asthma   . GERD (gastroesophageal reflux disease)   . Hyperlipidemia   . Hypothyroidism   . Osteoarthritis     Past Surgical History:  Procedure Laterality Date  . CATARACT EXTRACTION     left and right  . HIP ARTHROPLASTY Left 10/04/2019   Procedure: ARTHROPLASTY BIPOLAR HIP (HEMIARTHROPLASTY);  Surgeon: Carole Civil, MD;  Location: [facility];  Service: Orthopedics;  Laterality: Left;  . THYROIDECTOMY    . TONSILLECTOMY    . TOTAL KNEE ARTHROPLASTY     right and left knee  . VAGINAL HYSTERECTOMY      Social History   Socioeconomic History  . Marital status: Married    Spouse name: Not on file  . Number of children: Not on file  . Years of education: Not on file  . Highest education level: Not on file  Occupational History  . Occupation: retired  Tobacco Use  . Smoking status: Never Smoker  . Smokeless tobacco: Never Used  Substance and Sexual Activity  . Alcohol use: No  . Drug use: No  . Sexual activity: Not on file  Other Topics Concern  . Not on file  Social History Narrative  . Not on file    Social Determinants of Health   Financial Resource Strain:   . Difficulty of Paying Living Expenses: Not on file  Food Insecurity:   . Worried About Charity fundraiser in the Last Year: Not on file  . Ran Out of Food in the Last Year: Not on file  Transportation Needs:   . Lack of Transportation (Medical): Not on file  . Lack of Transportation (Non-Medical): Not on file  Physical Activity:   . Days of Exercise per Week: Not on file  . Minutes of Exercise per Session: Not on file  Stress:   . Feeling of Stress : Not on file  Social Connections:   . Frequency of Communication with Friends and Family: Not on file  . Frequency of Social Gatherings with Friends and Family: Not on file  . Attends Religious Services: Not on file  . Active Member of Clubs or Organizations: Not on file  . Attends Archivist Meetings: Not on file  . Marital Status: Not on file  Intimate Partner Violence:   . Fear of Current or Ex-Partner: Not on file  . Emotionally Abused: Not on file  . Physically Abused: Not on file  . Sexually Abused: Not on file   Family History  Problem Relation Age of Onset  . Cancer Son   .  Liver cancer Brother       VITAL SIGNS BP 112/62   Pulse 83   Temp (!) 97.4 F (36.3 C) (Oral)   Resp 20   Ht 5\' 6"  (1.676 m)   Wt 138 lb 7.2 oz (62.8 kg)   SpO2 95%   BMI 22.35 kg/m   Outpatient Encounter Medications as of 10/15/2019  Medication Sig  . acetaZOLAMIDE (DIAMOX) 250 MG tablet Take 250 mg by mouth 2 (two) times daily.  Marland Kitchen albuterol (VENTOLIN HFA) 108 (90 Base) MCG/ACT inhaler Inhale 2 puffs into the lungs every 4 (four) hours as needed for wheezing or shortness of breath.   Marland Kitchen aspirin EC 325 MG EC tablet Take 1 tablet (325 mg total) by mouth daily with breakfast.  . budesonide (PULMICORT) 0.5 MG/2ML nebulizer solution Take 0.5 mg by nebulization 2 (two) times daily.  Marland Kitchen docusate sodium (COLACE) 100 MG capsule Take 1 capsule (100 mg total) by mouth 2 (two)  times daily.  . feeding supplement, ENSURE ENLIVE, (ENSURE ENLIVE) LIQD Take 237 mLs by mouth 2 (two) times daily between meals.  . furosemide (LASIX) 40 MG tablet Take 40 mg by mouth daily.   Marland Kitchen HYDROcodone-acetaminophen (NORCO/VICODIN) 5-325 MG tablet Take 1 tablet by mouth every 6 (six) hours as needed for moderate pain or severe pain.  Marland Kitchen ipratropium-albuterol (DUONEB) 0.5-2.5 (3) MG/3ML SOLN Take 3 mLs by nebulization every 6 (six) hours as needed (for sohrtness of breath).   Marland Kitchen levothyroxine (SYNTHROID) 125 MCG tablet Take 125 mcg by mouth every morning.  . loratadine (CLARITIN) 10 MG tablet Take 10 mg by mouth daily.  Marland Kitchen losartan (COZAAR) 50 MG tablet Take 50 mg by mouth daily.  . montelukast (SINGULAIR) 10 MG tablet Take 1 tablet by mouth Daily.  . Multiple Vitamin (MULTIVITAMIN WITH MINERALS) TABS tablet Take 1 tablet by mouth daily.  Marland Kitchen NEXIUM 40 MG capsule Take 1 tablet by mouth Daily.  . NON FORMULARY Diet: _____ Regular,  __x____ NAS,  _______Consistent Carbohydrate,  _______NPO  _____Other  . nutrition supplement, JUVEN, (JUVEN) PACK Take 1 packet by mouth 2 (two) times daily between meals.  . potassium chloride SA (KLOR-CON) 20 MEQ tablet Take 1 tablet (20 mEq total) by mouth daily.  . predniSONE (DELTASONE) 5 MG tablet Take 5 mg by mouth daily.   . sertraline (ZOLOFT) 25 MG tablet Take 25 mg by mouth daily.   No facility-administered encounter medications on file as of 10/15/2019.     SIGNIFICANT DIAGNOSTIC EXAMS  PREVIOUS  10-03-19: chest x-ray: Cardiomegaly with large hiatal hernia. No evidence for acute disease or significant interval change from prior.  10-03-19: left hip x-ray: 1. Impacted subcapital left femoral neck fracture.   NO NEW EXAMS.   LABS REVIEWED PREVIOUS   10-03-19: wbc 11.9 hgb 12.3; hct 38.8; mcv 99.2 plt 361; glucose 117; bun 16; creat 0.60 ;k+ 3.6; na++ 135; ca 9.6 liver normal albumin 3.7 10-07-19: wbc 10.6; hgb 8.6; hct 27.6; mcv 101.8 plt 275  glucose 95; bun 38; creat 0.63 ;k+ 3.9; na++ 136; ca 9.6 mag 2.0   NO NEW LABS.   Review of Systems  Constitutional: Negative for malaise/fatigue.  Respiratory: Negative for cough and shortness of breath.   Cardiovascular: Negative for chest pain, palpitations and leg swelling.  Gastrointestinal: Negative for abdominal pain, constipation and heartburn.  Musculoskeletal: Positive for back pain and joint pain. Negative for myalgias.  Skin: Negative.   Neurological: Negative for dizziness.  Psychiatric/Behavioral: The patient is not nervous/anxious.  Physical Exam Constitutional:      General: Lindsey Butler is not in acute distress.    Appearance: Lindsey Butler is well-developed. Lindsey Butler is not diaphoretic.  Eyes:     Comments: Bilateral cataract removal    Neck:     Comments: History thyroidectomy Cardiovascular:     Rate and Rhythm: Normal rate and regular rhythm.     Pulses: Normal pulses.     Heart sounds: Normal heart sounds.  Pulmonary:     Effort: Pulmonary effort is normal. No respiratory distress.     Breath sounds: Normal breath sounds.  Abdominal:     General: Bowel sounds are normal. There is no distension.     Palpations: Abdomen is soft.     Tenderness: There is no abdominal tenderness.  Musculoskeletal:     Cervical back: Neck supple.     Right lower leg: No edema.     Left lower leg: No edema.     Comments:  Is able to move all extremities Left hip arthroplasty 10-04-19 History of bilateral knee replacements     Lymphadenopathy:     Cervical: No cervical adenopathy.  Skin:    General: Skin is warm and dry.  Neurological:     Mental Status: Lindsey Butler is alert. Mental status is at baseline.  Psychiatric:        Mood and Affect: Mood normal.      ASSESSMENT/ PLAN:  TODAY  1. Closed fracture of neck of left femur with routine healing subsequent encounter: is stable will continue therapy as directed will stop vicodin and will begin tylenol xr 650 mg every 6 hours will monitor    2. Post operative hypothyroidism: is stable will continue synthroid 125 mcg daily   3. Moderate persistent asthma without complication: is stable will continue albuterol 2 puffs every 4 hours as needed; pulmicort neb 0.5 mg twice daily claritin 10 mg daily singulair 10 m daily prednisone 5 mg daily duoneb every 6 hours as needed.   PREVIOUS  4. Bilateral lower extremity edema: is stable will continue lasix 40 mg daily with k+ 20 meq daily   5. Essential hypertension: is stable b/p 112/62 will continue cozaar 50 mg daily   6. Major depression recurrent chronic: is stable will continue zoloft 25 mg daily   7. Hiatal hernia with GERD without esophagitis: is stable will continue nexium 40 mg daily   8. Increased intraocular pressure bilateral: will continue diamox 250 mg twice daily   9. Chronic constipation: is stable will continue colace twice daily           MD is aware of resident's narcotic use and is in agreement with current plan of care. We will attempt to wean resident as appropriate.  Ok Edwards NP [facility]  Contact [phone] Monday through Friday 8am- 5pm  After hours call [phone]

## 2019-10-16 ENCOUNTER — Encounter: Payer: Self-pay | Admitting: Internal Medicine

## 2019-10-16 ENCOUNTER — Non-Acute Institutional Stay (SKILLED_NURSING_FACILITY): Payer: Medicare Other | Admitting: Internal Medicine

## 2019-10-16 DIAGNOSIS — J4540 Moderate persistent asthma, uncomplicated: Secondary | ICD-10-CM | POA: Diagnosis not present

## 2019-10-16 DIAGNOSIS — E890 Postprocedural hypothyroidism: Secondary | ICD-10-CM | POA: Diagnosis not present

## 2019-10-16 DIAGNOSIS — F339 Major depressive disorder, recurrent, unspecified: Secondary | ICD-10-CM

## 2019-10-16 DIAGNOSIS — S72002D Fracture of unspecified part of neck of left femur, subsequent encounter for closed fracture with routine healing: Secondary | ICD-10-CM | POA: Diagnosis not present

## 2019-10-16 DIAGNOSIS — Z96642 Presence of left artificial hip joint: Secondary | ICD-10-CM | POA: Diagnosis not present

## 2019-10-16 DIAGNOSIS — I10 Essential (primary) hypertension: Secondary | ICD-10-CM | POA: Diagnosis not present

## 2019-10-16 DIAGNOSIS — K219 Gastro-esophageal reflux disease without esophagitis: Secondary | ICD-10-CM

## 2019-10-16 DIAGNOSIS — K449 Diaphragmatic hernia without obstruction or gangrene: Secondary | ICD-10-CM

## 2019-10-16 NOTE — Progress Notes (Signed)
: Provider:  Hennie Duos., MD Location:  [place] Room Number: 157-D Place of Service:  SNF (31) PCP: Neale Burly, MD Patient Care Team: Neale Burly, MD as PCP - General (Internal Medicine)  Extended Emergency Contact Information Primary Emergency Contact: Barrasso,Kristal Mobile Phone: [phone] Relation: Relative Secondary Emergency Contact: Prochazka,Chris Mobile Phone: [phone] Relation: Son     Allergies: Patient has no known allergies.  Chief Complaint  Patient presents with  . New Admit To SNF    New admission to [facility]    HPI: Patient is an 86 y.o. female hypertension, hypothyroidism, depression, GERD, and asthma who presents to [facility], ED after a fall at home around 1:30 PM.  She says her left knee gave way and caused her to fall on her left side.  No head injury or loss of consciousness.  In the ED patient was found to have a left femoral neck fracture.  Patient was admitted to [facility] from 2/25-3/1 where patient underwent a partial left hip replacement on 2/26.  There were no stated complications.  Patient is admitted to skilled nursing facility for OT/PT.  While at skilled nursing facility patient will be followed for hypothyroidism treated with Synthroid, hypertension treated with Lasix losartan and GERD treated with Nexium.  Past Medical History:  Diagnosis Date  . Actinic keratosis   . Asthma   . GERD (gastroesophageal reflux disease)   . Hyperlipidemia   . Hypothyroidism   . Osteoarthritis     Past Surgical History:  Procedure Laterality Date  . CATARACT EXTRACTION     left and right  . HIP ARTHROPLASTY Left 10/04/2019   Procedure: ARTHROPLASTY BIPOLAR HIP (HEMIARTHROPLASTY);  Surgeon: Carole Civil, MD;  Location: [facility];  Service: Orthopedics;  Laterality: Left;  . THYROIDECTOMY    . TONSILLECTOMY    . TOTAL KNEE ARTHROPLASTY     right and left knee  . VAGINAL HYSTERECTOMY       Allergies as of 10/16/2019   No Known Allergies     Medication List    Notice   This visit is during an admission. Changes to the med list made in this visit will be reflected in the After Visit Summary of the admission.    Current Outpatient Medications on File Prior to Visit  Medication Sig Dispense Refill  . acetaminophen (TYLENOL) 650 MG CR tablet Take 650 mg by mouth every 6 (six) hours.     Marland Kitchen acetaZOLAMIDE (DIAMOX) 250 MG tablet Take 250 mg by mouth 2 (two) times daily.    Marland Kitchen albuterol (VENTOLIN HFA) 108 (90 Base) MCG/ACT inhaler Inhale 2 puffs into the lungs every 4 (four) hours as needed for wheezing or shortness of breath.     Marland Kitchen aspirin EC 325 MG EC tablet Take 1 tablet (325 mg total) by mouth daily with breakfast. 35 tablet 0  . budesonide (PULMICORT) 0.5 MG/2ML nebulizer solution Take 0.5 mg by nebulization 2 (two) times daily.    Marland Kitchen docusate sodium (COLACE) 100 MG capsule Take 1 capsule (100 mg total) by mouth 2 (two) times daily. 10 capsule 0  . feeding supplement, ENSURE ENLIVE, (ENSURE ENLIVE) LIQD Take 237 mLs by mouth 2 (two) times daily between meals. 237 mL 12  . furosemide (LASIX) 40 MG tablet Take 40 mg by mouth daily.     Marland Kitchen ipratropium-albuterol (DUONEB) 0.5-2.5 (3) MG/3ML SOLN Take 3 mLs by nebulization every 6 (six) hours as needed (SOB).     Marland Kitchen  levothyroxine (SYNTHROID) 125 MCG tablet Take 125 mcg by mouth every morning.    . loratadine (CLARITIN) 10 MG tablet Take 10 mg by mouth daily.    Marland Kitchen losartan (COZAAR) 50 MG tablet Take 50 mg by mouth daily.    . montelukast (SINGULAIR) 10 MG tablet Take 1 tablet by mouth Daily.    . Multiple Vitamin (MULTIVITAMIN WITH MINERALS) TABS tablet Take 1 tablet by mouth daily. 30 tablet 0  . NEXIUM 40 MG capsule Take 1 tablet by mouth Daily.    . NON FORMULARY Diet: _____ Regular,  __x____ NAS,  _______Consistent Carbohydrate,  _______NPO  _____Other    . nutrition supplement, JUVEN, (JUVEN) PACK Take 1 packet by mouth 2  (two) times daily between meals. 8 each 0  . potassium chloride SA (KLOR-CON) 20 MEQ tablet Take 1 tablet (20 mEq total) by mouth daily. 30 tablet 0  . predniSONE (DELTASONE) 5 MG tablet Take 5 mg by mouth daily.     . sertraline (ZOLOFT) 25 MG tablet Take 25 mg by mouth daily.     No current facility-administered medications on file prior to visit.     No orders of the defined types were placed in this encounter.   Immunization History  Administered Date(s) Administered  . Influenza Split 05/09/2011, 06/08/2012  . Pneumococcal Polysaccharide-23 05/09/2011    Social History   Tobacco Use  . Smoking status: Never Smoker  . Smokeless tobacco: Never Used  Substance Use Topics  . Alcohol use: No    Family history is   Family History  Problem Relation Age of Onset  . Cancer Son   . Liver cancer Brother       Review of Systems  GENERAL:  no fevers, fatigue, appetite changes SKIN: No itching, or rash EYES: No eye pain, redness, discharge EARS: No earache, tinnitus, change in hearing NOSE: No congestion, drainage or bleeding  MOUTH/THROAT: No mouth or tooth pain, No sore throat RESPIRATORY: No cough, wheezing, SOB CARDIAC: No chest pain, palpitations, lower extremity edema  GI: No abdominal pain, No N/V/D or constipation, No heartburn or reflux  GU: No dysuria, frequency or urgency, or incontinence  MUSCULOSKELETAL: No unrelieved bone/joint pain NEUROLOGIC: No headache, dizziness or focal weakness PSYCHIATRIC: No c/o anxiety or sadness   Vitals:   10/16/19 1438  BP: 112/62  Pulse: 83  Resp: 20  Temp: (!) 97.4 F (36.3 C)  SpO2: 95%    SpO2 Readings from Last 1 Encounters:  10/16/19 95%   Body mass index is 22.35 kg/m.     Physical Exam  GENERAL APPEARANCE: Alert, conversant,  No acute distress.  SKIN: No diaphoresis rash HEAD: Normocephalic, atraumatic  EYES: Conjunctiva/lids clear. Pupils round, reactive. EOMs intact.  EARS: External exam WNL,  canals clear. Hearing grossly normal.  NOSE: No deformity or discharge.  MOUTH/THROAT: Lips w/o lesions  RESPIRATORY: Breathing is even, unlabored. Lung sounds are clear   CARDIOVASCULAR: Heart RRR no murmurs, rubs or gallops. No peripheral edema.   GASTROINTESTINAL: Abdomen is soft, non-tender, not distended w/ normal bowel sounds. GENITOURINARY: Bladder non tender, not distended  MUSCULOSKELETAL: No abnormal joints or musculature NEUROLOGIC:  Cranial nerves 2-12 grossly intact. Moves all extremities  PSYCHIATRIC: Mood and affect appropriate to situation, no behavioral issues  Patient Active Problem List   Diagnosis Date Noted  . Post-operative hypothyroidism 10/13/2019  . Bilateral lower extremity edema 10/13/2019  . Essential hypertension 10/13/2019  . Major depression, recurrent, chronic ([place]) 10/13/2019  . Hiatal hernia with  GERD without esophagitis 10/13/2019  . Increased intraocular pressure, bilateral 10/13/2019  . Closed left hip fracture ([place]) 10/04/2019  . Fracture of femoral neck, left ([place]) 10/03/2019  . Asthma 09/14/2012  . Abnormal chest CT 06/18/2012  . Chronic cough 05/23/2012      Labs reviewed: Basic Metabolic Panel:    Component Value Date/Time   NA 136 10/07/2019 0457   K 3.9 10/07/2019 0457   CL 107 10/07/2019 0457   CO2 22 10/07/2019 0457   GLUCOSE 95 10/07/2019 0457   BUN 38 (H) 10/07/2019 0457   CREATININE 0.63 10/07/2019 0457   CALCIUM 9.6 10/07/2019 0457   PROT 6.6 10/03/2019 2055   ALBUMIN 3.7 10/03/2019 2055   AST 19 10/03/2019 2055   ALT 19 10/03/2019 2055   ALKPHOS 56 10/03/2019 2055   BILITOT 0.6 10/03/2019 2055   GFRNONAA >60 10/07/2019 0457   GFRAA >60 10/07/2019 0457    Recent Labs    10/05/19 0731 10/06/19 0644 10/07/19 0457  NA 135 133* 136  K 3.5 3.8 3.9  CL 106 104 107  CO2 21* 22 22  GLUCOSE 111* 93 95  BUN 13 30* 38*  CREATININE 0.61 0.74 0.63  CALCIUM 8.7* 9.1 9.6  MG  --  1.7 2.0   Liver Function  Tests: Recent Labs    10/03/19 2055  AST 19  ALT 19  ALKPHOS 56  BILITOT 0.6  PROT 6.6  ALBUMIN 3.7   No results for input(s): LIPASE, AMYLASE in the last 8760 hours. No results for input(s): AMMONIA in the last 8760 hours. CBC: Recent Labs    10/03/19 2055 10/04/19 0439 10/05/19 0731 10/06/19 0644 10/07/19 0457  WBC 11.9*   < > 12.9* 11.4* 10.6*  NEUTROABS 9.9*  --   --   --   --   HGB 12.3   < > 9.9* 8.9* 8.6*  HCT 38.8   < > 31.6* 28.7* 27.6*  MCV 99.2   < > 101.3* 102.1* 101.8*  PLT 361   < > 264 229 275   < > = values in this interval not displayed.   Lipid No results for input(s): CHOL, HDL, LDLCALC, TRIG in the last 8760 hours.  Cardiac Enzymes: No results for input(s): CKTOTAL, CKMB, CKMBINDEX, TROPONINI in the last 8760 hours. BNP: No results for input(s): BNP in the last 8760 hours. No results found for: MICROALBUR No results found for: HGBA1C No results found for: TSH No results found for: VITAMINB12 No results found for: FOLATE No results found for: IRON, TIBC, FERRITIN  Imaging and Procedures obtained prior to SNF admission: No results found.   Not all labs, radiology exams or other studies done during hospitalization come through on my EPIC note; however they are reviewed by me.    Assessment and Plan  Left femoral neck fracture/status post ORIF-no complications; DVT prophylaxis with ASA 325 mg daily for 35 days SNF-admitted for OT/PT; continue aspirin 325 mg daily for 35 days as prophylaxis  Hypothyroidism, postoperative SNF-no status uncontrolled; continue Synthroid 125 mcg daily  Hypertension SNF-stable; continue Lasix 40 mg daily, and losartan 50 mg daily  GERD with hiatal hernia SNF-no complaints; continue Nexium 40 mg daily  Depression SNF-appears controlled; continue Zoloft 25 mg  Asthma SNF-continue Pulmicort and DuoNeb 0.5 mg twice daily, Singulair 10 mg daily, prednisone 5 mg daily, Claritin 10 mg daily, and as needed  albuterol or DuoNeb   Time spent greater than 45 minutes;> 50% of time with patient was spent  reviewing records, labs, tests and studies, counseling and developing plan of care  Hennie Duos, MD

## 2019-10-17 ENCOUNTER — Non-Acute Institutional Stay (SKILLED_NURSING_FACILITY): Payer: Medicare Other | Admitting: Adult Health

## 2019-10-17 ENCOUNTER — Encounter: Payer: Self-pay | Admitting: Adult Health

## 2019-10-17 DIAGNOSIS — K449 Diaphragmatic hernia without obstruction or gangrene: Secondary | ICD-10-CM | POA: Diagnosis not present

## 2019-10-17 DIAGNOSIS — S72002D Fracture of unspecified part of neck of left femur, subsequent encounter for closed fracture with routine healing: Secondary | ICD-10-CM | POA: Diagnosis not present

## 2019-10-17 DIAGNOSIS — K219 Gastro-esophageal reflux disease without esophagitis: Secondary | ICD-10-CM | POA: Diagnosis not present

## 2019-10-17 DIAGNOSIS — I10 Essential (primary) hypertension: Secondary | ICD-10-CM

## 2019-10-17 NOTE — Progress Notes (Signed)
Location:    [place] Room Number: 157/D Place of Service:  SNF (31)   CODE STATUS: DNR  No Known Allergies  Chief Complaint  Patient presents with  . Acute Visit    Care Plan Meeting    HPI:  We have come together for her care plan meeting. Family is present. BIMS 8/15 mood 9-30. She is workig with therapy is able to ambulate 5-15 feet in room. Is having fewer incontinence episodes. She is eating less than 1/2 of her meals is taking supplements. She does need to be abl to work on steps. There are no reports of uncontrolled pain. She continues to be followed for her chronic illnesses including: gerd hypertension left femur fracture.   Past Medical History:  Diagnosis Date  . Actinic keratosis   . Asthma   . GERD (gastroesophageal reflux disease)   . Hyperlipidemia   . Hypothyroidism   . Osteoarthritis     Past Surgical History:  Procedure Laterality Date  . CATARACT EXTRACTION     left and right  . HIP ARTHROPLASTY Left 10/04/2019   Procedure: ARTHROPLASTY BIPOLAR HIP (HEMIARTHROPLASTY);  Surgeon: Carole Civil, MD;  Location: [facility];  Service: Orthopedics;  Laterality: Left;  . THYROIDECTOMY    . TONSILLECTOMY    . TOTAL KNEE ARTHROPLASTY     right and left knee  . VAGINAL HYSTERECTOMY      Social History   Socioeconomic History  . Marital status: Married    Spouse name: Not on file  . Number of children: Not on file  . Years of education: Not on file  . Highest education level: Not on file  Occupational History  . Occupation: retired  Tobacco Use  . Smoking status: Never Smoker  . Smokeless tobacco: Never Used  Substance and Sexual Activity  . Alcohol use: No  . Drug use: No  . Sexual activity: Not on file  Other Topics Concern  . Not on file  Social History Narrative  . Not on file   Social Determinants of Health   Financial Resource Strain:   . Difficulty of Paying Living Expenses:   Food Insecurity:   . Worried  About Charity fundraiser in the Last Year:   . Arboriculturist in the Last Year:   Transportation Needs:   . Film/video editor (Medical):   Marland Kitchen Lack of Transportation (Non-Medical):   Physical Activity:   . Days of Exercise per Week:   . Minutes of Exercise per Session:   Stress:   . Feeling of Stress :   Social Connections:   . Frequency of Communication with Friends and Family:   . Frequency of Social Gatherings with Friends and Family:   . Attends Religious Services:   . Active Member of Clubs or Organizations:   . Attends Archivist Meetings:   Marland Kitchen Marital Status:   Intimate Partner Violence:   . Fear of Current or Ex-Partner:   . Emotionally Abused:   Marland Kitchen Physically Abused:   . Sexually Abused:    Family History  Problem Relation Age of Onset  . Cancer Son   . Liver cancer Brother       VITAL SIGNS BP 132/74   Pulse 80   Temp 97.9 F (36.6 C) (Oral)   Resp 18   Ht 5\' 6"  (1.676 m)   Wt 138 lb 7.2 oz (62.8 kg)   SpO2 95%   BMI 22.35 kg/m  Outpatient Encounter Medications as of 10/17/2019  Medication Sig  . acetaminophen (TYLENOL) 650 MG CR tablet Take 650 mg by mouth every 6 (six) hours.   Marland Kitchen acetaZOLAMIDE (DIAMOX) 250 MG tablet Take 250 mg by mouth 2 (two) times daily.  Marland Kitchen albuterol (VENTOLIN HFA) 108 (90 Base) MCG/ACT inhaler Inhale 2 puffs into the lungs every 4 (four) hours as needed for wheezing or shortness of breath.   Marland Kitchen aspirin EC 325 MG EC tablet Take 1 tablet (325 mg total) by mouth daily with breakfast.  . budesonide (PULMICORT) 0.5 MG/2ML nebulizer solution Take 0.5 mg by nebulization 2 (two) times daily.  Marland Kitchen docusate sodium (COLACE) 100 MG capsule Take 1 capsule (100 mg total) by mouth 2 (two) times daily.  . feeding supplement, ENSURE ENLIVE, (ENSURE ENLIVE) LIQD Take 237 mLs by mouth 2 (two) times daily between meals.  . furosemide (LASIX) 40 MG tablet Take 40 mg by mouth daily.   Marland Kitchen ipratropium-albuterol (DUONEB) 0.5-2.5 (3) MG/3ML SOLN  Take 3 mLs by nebulization every 6 (six) hours as needed (SOB).   Marland Kitchen levothyroxine (SYNTHROID) 125 MCG tablet Take 125 mcg by mouth every morning.  . loratadine (CLARITIN) 10 MG tablet Take 10 mg by mouth daily.  Marland Kitchen losartan (COZAAR) 50 MG tablet Take 50 mg by mouth daily.  . montelukast (SINGULAIR) 10 MG tablet Take 1 tablet by mouth Daily.  . Multiple Vitamin (MULTIVITAMIN WITH MINERALS) TABS tablet Take 1 tablet by mouth daily.  Marland Kitchen NEXIUM 40 MG capsule Take 1 tablet by mouth Daily.  . NON FORMULARY Diet: _____ Regular,  __x____ NAS,  _______Consistent Carbohydrate,  _______NPO  _____Other  . nutrition supplement, JUVEN, (JUVEN) PACK Take 1 packet by mouth 2 (two) times daily between meals.  . potassium chloride SA (KLOR-CON) 20 MEQ tablet Take 1 tablet (20 mEq total) by mouth daily.  . predniSONE (DELTASONE) 5 MG tablet Take 5 mg by mouth daily.   . sertraline (ZOLOFT) 25 MG tablet Take 25 mg by mouth daily.   No facility-administered encounter medications on file as of 10/17/2019.     SIGNIFICANT DIAGNOSTIC EXAMS   PREVIOUS  10-03-19: chest x-ray: Cardiomegaly with large hiatal hernia. No evidence for acute disease or significant interval change from prior.  10-03-19: left hip x-ray: 1. Impacted subcapital left femoral neck fracture.   NO NEW EXAMS.   LABS REVIEWED PREVIOUS   10-03-19: wbc 11.9 hgb 12.3; hct 38.8; mcv 99.2 plt 361; glucose 117; bun 16; creat 0.60 ;k+ 3.6; na++ 135; ca 9.6 liver normal albumin 3.7 10-07-19: wbc 10.6; hgb 8.6; hct 27.6; mcv 101.8 plt 275 glucose 95; bun 38; creat 0.63 ;k+ 3.9; na++ 136; ca 9.6 mag 2.0   NO NEW LABS.   Review of Systems  Constitutional: Negative for malaise/fatigue.  Respiratory: Negative for cough and shortness of breath.   Cardiovascular: Negative for chest pain, palpitations and leg swelling.  Gastrointestinal: Negative for abdominal pain, constipation and heartburn.  Musculoskeletal: Negative for back pain, joint pain and  myalgias.  Skin: Negative.   Neurological: Negative for dizziness.  Psychiatric/Behavioral: The patient is not nervous/anxious.    Physical Exam Constitutional:      General: She is not in acute distress.    Appearance: She is well-developed. She is not diaphoretic.  Eyes:     Comments: Bilateral cataract removal     Neck:     Comments: History thyroidectomy Cardiovascular:     Rate and Rhythm: Normal rate and regular rhythm.  Heart sounds: Normal heart sounds.  Pulmonary:     Effort: Pulmonary effort is normal. No respiratory distress.     Breath sounds: Normal breath sounds.  Abdominal:     General: Bowel sounds are normal. There is no distension.     Palpations: Abdomen is soft.     Tenderness: There is no abdominal tenderness.  Musculoskeletal:     Right lower leg: No edema.     Left lower leg: No edema.     Comments: Is able to move all extremities Left hip arthroplasty 10-04-19 History of bilateral knee replacements      Lymphadenopathy:     Cervical: No cervical adenopathy.  Skin:    General: Skin is warm and dry.  Neurological:     Mental Status: She is alert. Mental status is at baseline.  Psychiatric:        Mood and Affect: Mood normal.       ASSESSMENT/ PLAN:  TODAY  1. Fracture of neck of left femur sequale 2 hiatal hernia with gerd without esophagitis 3. Essential hypertension  Will continue therapy as directed Will continue current medications Will continue to monitor her status.   MD is aware of resident's narcotic use and is in agreement with current plan of care. We will attempt to wean resident as appropriate.  Ok Edwards NP [facility]  Contact [phone] Monday through Friday 8am- 5pm  After hours call [phone]

## 2019-10-19 ENCOUNTER — Encounter: Payer: Self-pay | Admitting: Internal Medicine

## 2019-10-19 DIAGNOSIS — Z96642 Presence of left artificial hip joint: Secondary | ICD-10-CM | POA: Insufficient documentation

## 2019-10-23 ENCOUNTER — Non-Acute Institutional Stay (SKILLED_NURSING_FACILITY): Payer: Medicare Other | Admitting: Adult Health

## 2019-10-23 ENCOUNTER — Encounter: Payer: Self-pay | Admitting: Adult Health

## 2019-10-23 DIAGNOSIS — M545 Low back pain, unspecified: Secondary | ICD-10-CM

## 2019-10-23 DIAGNOSIS — R600 Localized edema: Secondary | ICD-10-CM

## 2019-10-23 DIAGNOSIS — G8929 Other chronic pain: Secondary | ICD-10-CM | POA: Diagnosis not present

## 2019-10-23 DIAGNOSIS — I10 Essential (primary) hypertension: Secondary | ICD-10-CM | POA: Diagnosis not present

## 2019-10-23 NOTE — Progress Notes (Signed)
Location:    [place] Room Number: 129/P Place of Service:  SNF (31)   CODE STATUS: DNR  No Known Allergies  Chief Complaint  Patient presents with  . Medical Management of Chronic Issues        Chronic bilateral low back pain without sciatica:   Bilateral lower extremity edema: . Essential hypertension:  Weekly follow up for the first 30 days post hospitalization.     HPI:  We have come together for her care plan meeting. BIMS 9-30 mood 8-30. Family is present. No reports of falls she has lost weight from Jan 2021 of 160 pounds to her current weight of 138.6 pounds. She is eating up to 50% of her meals with supplements. She is having chronic back pain. Is able to ambulate 25-30 feet then requires rest using walker with therapy. Is sitting up in chair.   She is also being seen for the management of her chronic illnesses: back pain; edema; hypertension.   Past Medical History:  Diagnosis Date  . Actinic keratosis   . Asthma   . GERD (gastroesophageal reflux disease)   . Hyperlipidemia   . Hypothyroidism   . Osteoarthritis     Past Surgical History:  Procedure Laterality Date  . CATARACT EXTRACTION     left and right  . HIP ARTHROPLASTY Left 10/04/2019   Procedure: ARTHROPLASTY BIPOLAR HIP (HEMIARTHROPLASTY);  Surgeon: Carole Civil, MD;  Location: [facility];  Service: Orthopedics;  Laterality: Left;  . THYROIDECTOMY    . TONSILLECTOMY    . TOTAL KNEE ARTHROPLASTY     right and left knee  . VAGINAL HYSTERECTOMY      Social History   Socioeconomic History  . Marital status: Married    Spouse name: Not on file  . Number of children: Not on file  . Years of education: Not on file  . Highest education level: Not on file  Occupational History  . Occupation: retired  Tobacco Use  . Smoking status: Never Smoker  . Smokeless tobacco: Never Used  Substance and Sexual Activity  . Alcohol use: No  . Drug use: No  . Sexual activity: Not on  file  Other Topics Concern  . Not on file  Social History Narrative  . Not on file   Social Determinants of Health   Financial Resource Strain:   . Difficulty of Paying Living Expenses:   Food Insecurity:   . Worried About Charity fundraiser in the Last Year:   . Arboriculturist in the Last Year:   Transportation Needs:   . Film/video editor (Medical):   Marland Kitchen Lack of Transportation (Non-Medical):   Physical Activity:   . Days of Exercise per Week:   . Minutes of Exercise per Session:   Stress:   . Feeling of Stress :   Social Connections:   . Frequency of Communication with Friends and Family:   . Frequency of Social Gatherings with Friends and Family:   . Attends Religious Services:   . Active Member of Clubs or Organizations:   . Attends Archivist Meetings:   Marland Kitchen Marital Status:   Intimate Partner Violence:   . Fear of Current or Ex-Partner:   . Emotionally Abused:   Marland Kitchen Physically Abused:   . Sexually Abused:    Family History  Problem Relation Age of Onset  . Cancer Son   . Liver cancer Brother       VITAL SIGNS  BP 134/61   Pulse (!) 55   Temp 98 F (36.7 C) (Oral)   Resp 16   Ht 5\' 6"  (1.676 m)   Wt 138 lb 7.2 oz (62.8 kg)   SpO2 95%   BMI 22.35 kg/m   Outpatient Encounter Medications as of 10/23/2019  Medication Sig  . acetaminophen (TYLENOL) 650 MG CR tablet Take 650 mg by mouth every 6 (six) hours.   Marland Kitchen acetaZOLAMIDE (DIAMOX) 250 MG tablet Take 250 mg by mouth 2 (two) times daily.  Marland Kitchen albuterol (VENTOLIN HFA) 108 (90 Base) MCG/ACT inhaler Inhale 2 puffs into the lungs every 4 (four) hours as needed for wheezing or shortness of breath.   Marland Kitchen aspirin EC 325 MG EC tablet Take 1 tablet (325 mg total) by mouth daily with breakfast.  . budesonide (PULMICORT) 0.5 MG/2ML nebulizer solution Take 0.5 mg by nebulization 2 (two) times daily.  Marland Kitchen docusate sodium (COLACE) 100 MG capsule Take 1 capsule (100 mg total) by mouth 2 (two) times daily.  . feeding  supplement, ENSURE ENLIVE, (ENSURE ENLIVE) LIQD Take 237 mLs by mouth 2 (two) times daily between meals.  . furosemide (LASIX) 40 MG tablet Take 40 mg by mouth daily.   Marland Kitchen ipratropium-albuterol (DUONEB) 0.5-2.5 (3) MG/3ML SOLN Take 3 mLs by nebulization every 6 (six) hours as needed (SOB).   Marland Kitchen levothyroxine (SYNTHROID) 125 MCG tablet Take 125 mcg by mouth every morning.  . loratadine (CLARITIN) 10 MG tablet Take 10 mg by mouth daily.  Marland Kitchen losartan (COZAAR) 50 MG tablet Take 50 mg by mouth daily.  . montelukast (SINGULAIR) 10 MG tablet Take 1 tablet by mouth Daily.  . Multiple Vitamin (MULTIVITAMIN WITH MINERALS) TABS tablet Take 1 tablet by mouth daily.  Marland Kitchen NEXIUM 40 MG capsule Take 1 tablet by mouth Daily.  . NON FORMULARY Diet: _____ Regular,  __x____ NAS,  _______Consistent Carbohydrate,  _______NPO  _____Other  . nutrition supplement, JUVEN, (JUVEN) PACK Take 1 packet by mouth 2 (two) times daily between meals.  . potassium chloride SA (KLOR-CON) 20 MEQ tablet Take 1 tablet (20 mEq total) by mouth daily.  . predniSONE (DELTASONE) 5 MG tablet Take 5 mg by mouth daily.   . sertraline (ZOLOFT) 25 MG tablet Take 25 mg by mouth daily.   No facility-administered encounter medications on file as of 10/23/2019.     SIGNIFICANT DIAGNOSTIC EXAMS   PREVIOUS  10-03-19: chest x-ray: Cardiomegaly with large hiatal hernia. No evidence for acute disease or significant interval change from prior.  10-03-19: left hip x-ray: 1. Impacted subcapital left femoral neck fracture.   NO NEW EXAMS.   LABS REVIEWED PREVIOUS   10-03-19: wbc 11.9 hgb 12.3; hct 38.8; mcv 99.2 plt 361; glucose 117; bun 16; creat 0.60 ;k+ 3.6; na++ 135; ca 9.6 liver normal albumin 3.7 10-07-19: wbc 10.6; hgb 8.6; hct 27.6; mcv 101.8 plt 275 glucose 95; bun 38; creat 0.63 ;k+ 3.9; na++ 136; ca 9.6 mag 2.0   NO NEW LABS.   Review of Systems  Constitutional: Negative for malaise/fatigue.  Respiratory: Negative for cough and  shortness of breath.   Cardiovascular: Negative for chest pain, palpitations and leg swelling.  Gastrointestinal: Negative for abdominal pain, constipation and heartburn.  Musculoskeletal: Positive for back pain. Negative for joint pain and myalgias.  Skin: Negative.   Neurological: Negative for dizziness.  Psychiatric/Behavioral: The patient is not nervous/anxious.    .   Physical Exam Constitutional:      General: She is not in acute  distress.    Appearance: She is well-developed. She is not diaphoretic.  Eyes:     Comments: Bilateral cataract removal      Neck:     Thyroid: No thyromegaly.     Comments: History of thyroidectomy Cardiovascular:     Rate and Rhythm: Normal rate and regular rhythm.     Heart sounds: Normal heart sounds.  Pulmonary:     Effort: Pulmonary effort is normal. No respiratory distress.     Breath sounds: Normal breath sounds.  Abdominal:     General: Bowel sounds are normal. There is no distension.     Palpations: Abdomen is soft.     Tenderness: There is no abdominal tenderness.  Musculoskeletal:     Cervical back: Neck supple.     Right lower leg: No edema.     Left lower leg: No edema.     Comments: Is able to move all extremities Left hip arthroplasty 10-04-19 History of bilateral knee replacements       Lymphadenopathy:     Cervical: No cervical adenopathy.  Skin:    General: Skin is warm and dry.  Neurological:     Mental Status: She is alert. Mental status is at baseline.  Psychiatric:        Mood and Affect: Mood normal.       ASSESSMENT/ PLAN:  TODAY  1. Chronic bilateral low back pain without sciatica: is having uncontrolled pain; will begin aleve 220 mg twice daily   2. Bilateral lower extremity edema: is stable will continue lasix 40 mg daily with k+ 20 meq daily   3. Essential hypertension: is stable b/p 134/61: will continue cozaar 50 mg daily   PREVIOUS  4. Major depression recurrent chronic: is stable will continue  zoloft 25 mg daily   5. Hiatal hernia with GERD without esophagitis: is stable will continue nexium 40 mg daily   6. Increased intraocular pressure bilateral: will continue diamox 250 mg twice daily   7. Chronic constipation: is stable will continue colace twice daily   8. Post operative hypothyroidism: is stable will continue synthroid 125 mcg daily   9. Moderate persistent asthma without complication: is stable will continue albuterol 2 puffs every 4 hours as needed; pulmicort neb 0.5 mg twice daily claritin 10 mg daily singulair 10 m daily prednisone 5 mg daily duoneb every 6 hours as needed.   10. Closed fracture of neck of left femur with routine healing subsequent encounter: is stable will continue therapy as directed will stop vicodin and will continue  tylenol xr 650 mg every 6 hours will monitor         MD is aware of resident's narcotic use and is in agreement with current plan of care. We will attempt to wean resident as appropriate.  Ok Edwards NP [facility]  Contact [phone] Monday through Friday 8am- 5pm  After hours call [phone]

## 2019-10-30 ENCOUNTER — Encounter: Payer: Self-pay | Admitting: Adult Health

## 2019-10-30 ENCOUNTER — Non-Acute Institutional Stay (SKILLED_NURSING_FACILITY): Payer: Medicare Other | Admitting: Adult Health

## 2019-10-30 DIAGNOSIS — G8929 Other chronic pain: Secondary | ICD-10-CM | POA: Insufficient documentation

## 2019-10-30 DIAGNOSIS — F339 Major depressive disorder, recurrent, unspecified: Secondary | ICD-10-CM

## 2019-10-30 DIAGNOSIS — K449 Diaphragmatic hernia without obstruction or gangrene: Secondary | ICD-10-CM

## 2019-10-30 DIAGNOSIS — H40053 Ocular hypertension, bilateral: Secondary | ICD-10-CM | POA: Diagnosis not present

## 2019-10-30 DIAGNOSIS — M545 Low back pain, unspecified: Secondary | ICD-10-CM | POA: Insufficient documentation

## 2019-10-30 DIAGNOSIS — K219 Gastro-esophageal reflux disease without esophagitis: Secondary | ICD-10-CM

## 2019-10-30 NOTE — Progress Notes (Signed)
Location:    [facility] Room Number: 129/P Place of Service:  SNF (31)   CODE STATUS: DNR  No Known Allergies  Chief Complaint  Patient presents with  . Medical Management of Chronic Issues         Major depression recurrent chronic:    Hiatal hernia with GERD without esophagitis:  Increased intraocular pressure bilateral:  Weekly follow up for the first 30 days post hospitalization.      HPI:  She is a 86 year old short term rehab patient being seen for the management of her chronic illnesses: depression; gerd; intraocular pressure. There are no reports of uncontrolled pain; no changes in appetite; no heart burn; no reports of anxiety or insomnia.   Past Medical History:  Diagnosis Date  . Actinic keratosis   . Asthma   . GERD (gastroesophageal reflux disease)   . Hyperlipidemia   . Hypothyroidism   . Osteoarthritis     Past Surgical History:  Procedure Laterality Date  . CATARACT EXTRACTION     left and right  . HIP ARTHROPLASTY Left 10/04/2019   Procedure: ARTHROPLASTY BIPOLAR HIP (HEMIARTHROPLASTY);  Surgeon: Carole Civil, MD;  Location: [facility];  Service: Orthopedics;  Laterality: Left;  . THYROIDECTOMY    . TONSILLECTOMY    . TOTAL KNEE ARTHROPLASTY     right and left knee  . VAGINAL HYSTERECTOMY      Social History   Socioeconomic History  . Marital status: Married    Spouse name: Not on file  . Number of children: Not on file  . Years of education: Not on file  . Highest education level: Not on file  Occupational History  . Occupation: retired  Tobacco Use  . Smoking status: Never Smoker  . Smokeless tobacco: Never Used  Substance and Sexual Activity  . Alcohol use: No  . Drug use: No  . Sexual activity: Not on file  Other Topics Concern  . Not on file  Social History Narrative  . Not on file   Social Determinants of Health   Financial Resource Strain:   . Difficulty of Paying Living Expenses:   Food Insecurity:    . Worried About Charity fundraiser in the Last Year:   . Arboriculturist in the Last Year:   Transportation Needs:   . Film/video editor (Medical):   Marland Kitchen Lack of Transportation (Non-Medical):   Physical Activity:   . Days of Exercise per Week:   . Minutes of Exercise per Session:   Stress:   . Feeling of Stress :   Social Connections:   . Frequency of Communication with Friends and Family:   . Frequency of Social Gatherings with Friends and Family:   . Attends Religious Services:   . Active Member of Clubs or Organizations:   . Attends Archivist Meetings:   Marland Kitchen Marital Status:   Intimate Partner Violence:   . Fear of Current or Ex-Partner:   . Emotionally Abused:   Marland Kitchen Physically Abused:   . Sexually Abused:    Family History  Problem Relation Age of Onset  . Cancer Son   . Liver cancer Brother       VITAL SIGNS BP (!) 98/52   Pulse 73   Temp (!) 97.2 F (36.2 C) (Oral)   Resp 20   Ht 5\' 6"  (1.676 m)   Wt 138 lb 7.2 oz (62.8 kg)   SpO2 95%   BMI  22.35 kg/m   Outpatient Encounter Medications as of 10/30/2019  Medication Sig  . acetaminophen (TYLENOL) 650 MG CR tablet Take 650 mg by mouth every 6 (six) hours.   Marland Kitchen acetaZOLAMIDE (DIAMOX) 250 MG tablet Take 250 mg by mouth 2 (two) times daily.  Marland Kitchen albuterol (VENTOLIN HFA) 108 (90 Base) MCG/ACT inhaler Inhale 2 puffs into the lungs every 4 (four) hours as needed for wheezing or shortness of breath.   Marland Kitchen aspirin EC 325 MG EC tablet Take 1 tablet (325 mg total) by mouth daily with breakfast.  . budesonide (PULMICORT) 0.5 MG/2ML nebulizer solution Take 0.5 mg by nebulization 2 (two) times daily.  Marland Kitchen docusate sodium (COLACE) 100 MG capsule Take 1 capsule (100 mg total) by mouth 2 (two) times daily.  . feeding supplement, ENSURE ENLIVE, (ENSURE ENLIVE) LIQD Take 237 mLs by mouth 2 (two) times daily between meals.  . furosemide (LASIX) 40 MG tablet Take 40 mg by mouth daily.   Marland Kitchen ipratropium-albuterol (DUONEB)  0.5-2.5 (3) MG/3ML SOLN Take 3 mLs by nebulization every 6 (six) hours as needed (SOB).   Marland Kitchen levothyroxine (SYNTHROID) 125 MCG tablet Take 125 mcg by mouth every morning.  . loratadine (CLARITIN) 10 MG tablet Take 10 mg by mouth daily.  Marland Kitchen losartan (COZAAR) 50 MG tablet Take 50 mg by mouth daily.  . montelukast (SINGULAIR) 10 MG tablet Take 1 tablet by mouth Daily.  . Multiple Vitamin (MULTIVITAMIN WITH MINERALS) TABS tablet Take 1 tablet by mouth daily.  . naproxen sodium (ALEVE) 220 MG tablet Take 220 mg by mouth 2 (two) times daily.  Marland Kitchen NEXIUM 40 MG capsule Take 1 tablet by mouth Daily.  . NON FORMULARY Diet: _____ Regular,  __x____ NAS,  _______Consistent Carbohydrate,  _______NPO  _____Other  . nutrition supplement, JUVEN, (JUVEN) PACK Take 1 packet by mouth 2 (two) times daily between meals.  . potassium chloride SA (KLOR-CON) 20 MEQ tablet Take 1 tablet (20 mEq total) by mouth daily.  . predniSONE (DELTASONE) 5 MG tablet Take 5 mg by mouth daily.   . sertraline (ZOLOFT) 25 MG tablet Take 25 mg by mouth daily.   No facility-administered encounter medications on file as of 10/30/2019.     SIGNIFICANT DIAGNOSTIC EXAMS   PREVIOUS  10-03-19: chest x-ray: Cardiomegaly with large hiatal hernia. No evidence for acute disease or significant interval change from prior.  10-03-19: left hip x-ray: 1. Impacted subcapital left femoral neck fracture.   NO NEW EXAMS.   LABS REVIEWED PREVIOUS   10-03-19: wbc 11.9 hgb 12.3; hct 38.8; mcv 99.2 plt 361; glucose 117; bun 16; creat 0.60 ;k+ 3.6; na++ 135; ca 9.6 liver normal albumin 3.7 10-07-19: wbc 10.6; hgb 8.6; hct 27.6; mcv 101.8 plt 275 glucose 95; bun 38; creat 0.63 ;k+ 3.9; na++ 136; ca 9.6 mag 2.0   NO NEW LABS.   Review of Systems  Constitutional: Negative for malaise/fatigue.  Respiratory: Negative for cough and shortness of breath.   Cardiovascular: Negative for chest pain, palpitations and leg swelling.  Gastrointestinal: Negative  for abdominal pain, constipation and heartburn.  Musculoskeletal: Negative for back pain, joint pain and myalgias.  Skin: Negative.   Neurological: Negative for dizziness.  Psychiatric/Behavioral: The patient is not nervous/anxious.       Physical Exam Constitutional:      General: She is not in acute distress.    Appearance: She is well-developed. She is not diaphoretic.  Eyes:     Comments: Bilateral cataract removal  Neck:     Comments: History of thyroidectomy Cardiovascular:     Rate and Rhythm: Normal rate and regular rhythm.     Heart sounds: Normal heart sounds.  Pulmonary:     Effort: Pulmonary effort is normal. No respiratory distress.     Breath sounds: Normal breath sounds.  Abdominal:     General: Bowel sounds are normal. There is no distension.     Palpations: Abdomen is soft.     Tenderness: There is no abdominal tenderness.  Musculoskeletal:     Right lower leg: No edema.     Left lower leg: No edema.     Comments: Is able to move all extremities Left hip arthroplasty 10-04-19 History of bilateral knee replacements   Lymphadenopathy:     Cervical: No cervical adenopathy.  Skin:    General: Skin is warm and dry.  Neurological:     Mental Status: She is alert. Mental status is at baseline.  Psychiatric:        Mood and Affect: Mood normal.     ASSESSMENT/ PLAN:  TODAY  1. Major depression recurrent chronic: is stable will continue zoloft 25 mg daily  2. Hiatal hernia with GERD without esophagitis: is stable will continue nexium 40 mg daily   3. Increased intraocular pressure bilateral: will continue diamox 250 mg twice daily  PREVIOUS  4. Chronic constipation: is stable will continue colace twice daily   5. Post operative hypothyroidism: is stable will continue synthroid 125 mcg daily   6. Moderate persistent asthma without complication: is stable will continue albuterol 2 puffs every 4 hours as needed; pulmicort neb 0.5 mg twice daily  claritin 10 mg daily singulair 10 m daily prednisone 5 mg daily duoneb every 6 hours as needed.   7. Closed fracture of neck of left femur with routine healing subsequent encounter: is stable will continue therapy as directed will stop vicodin and will continue  tylenol xr 650 mg every 6 hours will monitor   8. Chronic bilateral low back pain without sciatica: is having uncontrolled pain; will continue aleve 220 mg twice daily   9. Bilateral lower extremity edema: is stable will continue lasix 40 mg daily with k+ 20 meq daily   10. Essential hypertension: is stable b/p 98/52: will continue cozaar 50 mg daily      MD is aware of resident's narcotic use and is in agreement with current plan of care. We will attempt to wean resident as appropriate.  Ok Edwards NP [facility]  Contact [phone] Monday through Friday 8am- 5pm  After hours call [phone]

## 2019-11-01 ENCOUNTER — Other Ambulatory Visit: Payer: Self-pay | Admitting: Adult Health

## 2019-11-01 ENCOUNTER — Non-Acute Institutional Stay (SKILLED_NURSING_FACILITY): Payer: Medicare Other | Admitting: Adult Health

## 2019-11-01 ENCOUNTER — Other Ambulatory Visit: Payer: Self-pay

## 2019-11-01 DIAGNOSIS — G8929 Other chronic pain: Secondary | ICD-10-CM | POA: Diagnosis not present

## 2019-11-01 DIAGNOSIS — M545 Low back pain, unspecified: Secondary | ICD-10-CM

## 2019-11-01 DIAGNOSIS — S72002D Fracture of unspecified part of neck of left femur, subsequent encounter for closed fracture with routine healing: Secondary | ICD-10-CM | POA: Diagnosis not present

## 2019-11-01 DIAGNOSIS — F339 Major depressive disorder, recurrent, unspecified: Secondary | ICD-10-CM | POA: Diagnosis not present

## 2019-11-01 MED ORDER — MONTELUKAST SODIUM 10 MG PO TABS: 10.0000 mg | ORAL_TABLET | Freq: Every day | ORAL | 0 refills | Status: DC

## 2019-11-01 MED ORDER — PREDNISONE 5 MG PO TABS: 5.0000 mg | ORAL_TABLET | Freq: Every day | ORAL | 0 refills | Status: DC

## 2019-11-01 MED ORDER — LEVOTHYROXINE SODIUM 125 MCG PO TABS: 125.0000 ug | ORAL_TABLET | Freq: Every morning | ORAL | 0 refills | Status: DC

## 2019-11-01 MED ORDER — POTASSIUM CHLORIDE CRYS ER 20 MEQ PO TBCR: 20.0000 meq | EXTENDED_RELEASE_TABLET | Freq: Every day | ORAL | 0 refills | Status: DC

## 2019-11-01 MED ORDER — ALBUTEROL SULFATE HFA 108 (90 BASE) MCG/ACT IN AERS: 2.0000 | INHALATION_SPRAY | RESPIRATORY_TRACT | 0 refills | Status: DC | PRN

## 2019-11-01 MED ORDER — IPRATROPIUM-ALBUTEROL 0.5-2.5 (3) MG/3ML IN SOLN: 3.0000 mL | Freq: Four times a day (QID) | RESPIRATORY_TRACT | 0 refills | Status: DC | PRN

## 2019-11-01 MED ORDER — ESOMEPRAZOLE MAGNESIUM 40 MG PO CPDR: 40.0000 mg | DELAYED_RELEASE_CAPSULE | Freq: Every day | ORAL | 0 refills | Status: DC

## 2019-11-01 MED ORDER — FUROSEMIDE 40 MG PO TABS: 40.0000 mg | ORAL_TABLET | Freq: Every day | ORAL | 0 refills | Status: DC

## 2019-11-01 MED ORDER — SERTRALINE HCL 25 MG PO TABS: 25.0000 mg | ORAL_TABLET | Freq: Every day | ORAL | 0 refills | Status: DC

## 2019-11-01 MED ORDER — ACETAZOLAMIDE 250 MG PO TABS: 250.0000 mg | ORAL_TABLET | Freq: Two times a day (BID) | ORAL | 0 refills | Status: DC

## 2019-11-01 MED ORDER — LOSARTAN POTASSIUM 50 MG PO TABS: 50.0000 mg | ORAL_TABLET | Freq: Every day | ORAL | 0 refills | Status: DC

## 2019-11-01 MED ORDER — BUDESONIDE 0.5 MG/2ML IN SUSP: 0.5000 mg | Freq: Two times a day (BID) | RESPIRATORY_TRACT | 0 refills | Status: DC

## 2019-11-01 NOTE — Patient Outreach (Signed)
[facility]) Care Management  11/01/2019  DUBLIN HIGHSMITH 1929/06/21 [id number]   Medication Adherence call to Mrs. Darvin Neighbours Telephone call to Patient regarding Medication Adherence unable to reach patient,patient did not answer Mrs. Hoberman is showing past due on Losartan 50 mg under [facility].   [place] Management Direct Dial [phone]  Fax [phone] [email or web address]

## 2019-11-03 NOTE — Progress Notes (Signed)
Location:   [facility] Room Number: [id number] of Service:  SNF (31)    CODE STATUS: full code   No Known Allergies  Chief Complaint  Patient presents with  . Discharge Note    to home     HPI:  She is being discharged to home with home health for pt/ot. Will not need dme; will need prescriptions written and will need to follow up with her medical provider. She was hospitalized for a left femur fracture. She was admitted to this facility for short term rehab. She participated in pt/ot to improve upon her level of independence. She is ready to complete therapy on a home health basis.    Past Medical History:  Diagnosis Date  . Actinic keratosis   . Asthma   . GERD (gastroesophageal reflux disease)   . Hyperlipidemia   . Hypothyroidism   . Osteoarthritis     Past Surgical History:  Procedure Laterality Date  . CATARACT EXTRACTION     left and right  . HIP ARTHROPLASTY Left 10/04/2019   Procedure: ARTHROPLASTY BIPOLAR HIP (HEMIARTHROPLASTY);  Surgeon: Carole Civil, MD;  Location: [facility];  Service: Orthopedics;  Laterality: Left;  . THYROIDECTOMY    . TONSILLECTOMY    . TOTAL KNEE ARTHROPLASTY     right and left knee  . VAGINAL HYSTERECTOMY      Social History   Socioeconomic History  . Marital status: Married    Spouse name: Not on file  . Number of children: Not on file  . Years of education: Not on file  . Highest education level: Not on file  Occupational History  . Occupation: retired  Tobacco Use  . Smoking status: Never Smoker  . Smokeless tobacco: Never Used  Substance and Sexual Activity  . Alcohol use: No  . Drug use: No  . Sexual activity: Not on file  Other Topics Concern  . Not on file  Social History Narrative  . Not on file   Social Determinants of Health   Financial Resource Strain:   . Difficulty of Paying Living Expenses:   Food Insecurity:   . Worried About Charity fundraiser in the Last Year:   . Arboriculturist  in the Last Year:   Transportation Needs:   . Film/video editor (Medical):   Marland Kitchen Lack of Transportation (Non-Medical):   Physical Activity:   . Days of Exercise per Week:   . Minutes of Exercise per Session:   Stress:   . Feeling of Stress :   Social Connections:   . Frequency of Communication with Friends and Family:   . Frequency of Social Gatherings with Friends and Family:   . Attends Religious Services:   . Active Member of Clubs or Organizations:   . Attends Archivist Meetings:   Marland Kitchen Marital Status:   Intimate Partner Violence:   . Fear of Current or Ex-Partner:   . Emotionally Abused:   Marland Kitchen Physically Abused:   . Sexually Abused:    Family History  Problem Relation Age of Onset  . Cancer Son   . Liver cancer Brother     VITAL SIGNS BP (!) 105/51   Pulse 62   Temp (!) 97.2 F (36.2 C)   Resp 20   Ht 5\' 6"  (1.676 m)   Wt 138 lb (62.6 kg)   BMI 22.27 kg/m   Patient's Medications  New Prescriptions   No medications on file  Previous  Medications   ACETAMINOPHEN (TYLENOL) 650 MG CR TABLET    Take 650 mg by mouth every 6 (six) hours.    ACETAZOLAMIDE (DIAMOX) 250 MG TABLET    Take 1 tablet (250 mg total) by mouth 2 (two) times daily.   ALBUTEROL (VENTOLIN HFA) 108 (90 BASE) MCG/ACT INHALER    Inhale 2 puffs into the lungs every 4 (four) hours as needed for wheezing or shortness of breath.   ASPIRIN EC 325 MG EC TABLET    Take 1 tablet (325 mg total) by mouth daily with breakfast.   BUDESONIDE (PULMICORT) 0.5 MG/2ML NEBULIZER SOLUTION    Take 2 mLs (0.5 mg total) by nebulization 2 (two) times daily.   DOCUSATE SODIUM (COLACE) 100 MG CAPSULE    Take 1 capsule (100 mg total) by mouth 2 (two) times daily.   ESOMEPRAZOLE (NEXIUM) 40 MG CAPSULE    Take 1 capsule (40 mg total) by mouth daily.   FEEDING SUPPLEMENT, ENSURE ENLIVE, (ENSURE ENLIVE) LIQD    Take 237 mLs by mouth 2 (two) times daily between meals.   FUROSEMIDE (LASIX) 40 MG TABLET    Take 1 tablet (40  mg total) by mouth daily.   IPRATROPIUM-ALBUTEROL (DUONEB) 0.5-2.5 (3) MG/3ML SOLN    Take 3 mLs by nebulization every 6 (six) hours as needed (SOB).   LEVOTHYROXINE (SYNTHROID) 125 MCG TABLET    Take 1 tablet (125 mcg total) by mouth every morning.   LORATADINE (CLARITIN) 10 MG TABLET    Take 10 mg by mouth daily.   LOSARTAN (COZAAR) 50 MG TABLET    Take 1 tablet (50 mg total) by mouth daily.   MONTELUKAST (SINGULAIR) 10 MG TABLET    Take 1 tablet (10 mg total) by mouth at bedtime.   MULTIPLE VITAMIN (MULTIVITAMIN WITH MINERALS) TABS TABLET    Take 1 tablet by mouth daily.   NAPROXEN SODIUM (ALEVE) 220 MG TABLET    Take 220 mg by mouth 2 (two) times daily.   NON FORMULARY    Diet: _____ Regular,  __x____ NAS,  _______Consistent Carbohydrate,  _______NPO  _____Other   NUTRITION SUPPLEMENT, JUVEN, (JUVEN) PACK    Take 1 packet by mouth 2 (two) times daily between meals.   POTASSIUM CHLORIDE SA (KLOR-CON) 20 MEQ TABLET    Take 1 tablet (20 mEq total) by mouth daily.   PREDNISONE (DELTASONE) 5 MG TABLET    Take 1 tablet (5 mg total) by mouth daily.   SERTRALINE (ZOLOFT) 25 MG TABLET    Take 1 tablet (25 mg total) by mouth daily.  Modified Medications   No medications on file  Discontinued Medications   No medications on file     SIGNIFICANT DIAGNOSTIC EXAMS   PREVIOUS  10-03-19: chest x-ray: Cardiomegaly with large hiatal hernia. No evidence for acute disease or significant interval change from prior.  10-03-19: left hip x-ray: 1. Impacted subcapital left femoral neck fracture.   NO NEW EXAMS.   LABS REVIEWED PREVIOUS   10-03-19: wbc 11.9 hgb 12.3; hct 38.8; mcv 99.2 plt 361; glucose 117; bun 16; creat 0.60 ;k+ 3.6; na++ 135; ca 9.6 liver normal albumin 3.7 10-07-19: wbc 10.6; hgb 8.6; hct 27.6; mcv 101.8 plt 275 glucose 95; bun 38; creat 0.63 ;k+ 3.9; na++ 136; ca 9.6 mag 2.0   NO NEW LABS.   Review of Systems  Constitutional: Negative for malaise/fatigue.  Respiratory: Negative  for cough and shortness of breath.   Cardiovascular: Negative for chest pain, palpitations and leg  swelling.  Gastrointestinal: Negative for abdominal pain, constipation and heartburn.  Musculoskeletal: Negative for back pain, joint pain and myalgias.  Skin: Negative.   Neurological: Negative for dizziness.  Psychiatric/Behavioral: The patient is not nervous/anxious.     Physical Exam Constitutional:      General: She is not in acute distress.    Appearance: She is well-developed. She is not diaphoretic.  Eyes:     Comments: Bilateral cataract removal        Neck:     Comments:  History of thyroidectomy Cardiovascular:     Rate and Rhythm: Normal rate and regular rhythm.     Pulses: Normal pulses.     Heart sounds: Normal heart sounds.  Pulmonary:     Effort: Pulmonary effort is normal. No respiratory distress.     Breath sounds: Normal breath sounds.  Abdominal:     General: Bowel sounds are normal. There is no distension.     Palpations: Abdomen is soft.     Tenderness: There is no abdominal tenderness.  Musculoskeletal:     Cervical back: Neck supple.     Right lower leg: No edema.     Left lower leg: No edema.     Comments: Is able to move all extremities Left hip arthroplasty 10-04-19 History of bilateral knee replacements    Lymphadenopathy:     Cervical: No cervical adenopathy.  Skin:    General: Skin is warm and dry.  Neurological:     Mental Status: She is alert. Mental status is at baseline.  Psychiatric:        Mood and Affect: Mood normal.       ASSESSMENT/ PLAN:   Patient is being discharged with the following home health services:  Pt/ot: to evaluate and treat as indicated for gait balance strength adl training.   Patient is being discharged with the following durable medical equipment:  None needed   Patient has been advised to f/u with their PCP in 1-2 weeks to bring them up to date on their rehab stay.  Social services at facility was responsible  for arranging this appointment.  Pt was provided with a 30 day supply of prescriptions for medications and refills must be obtained from their PCP.  For controlled substances, a more limited supply may be provided adequate until PCP appointment only.  A 30 day supply of her prescription medications have been sent to [facility] family pharmacy  Time spent with patient: 35 minutes: dme; medications; home health   Ok Edwards NP [facility] Medicine  Contact [phone] Monday through Friday 8am- 5pm  After hours call [phone]

## 2019-11-05 DIAGNOSIS — K219 Gastro-esophageal reflux disease without esophagitis: Secondary | ICD-10-CM | POA: Diagnosis not present

## 2019-11-05 DIAGNOSIS — J4540 Moderate persistent asthma, uncomplicated: Secondary | ICD-10-CM | POA: Diagnosis not present

## 2019-11-05 DIAGNOSIS — S72002D Fracture of unspecified part of neck of left femur, subsequent encounter for closed fracture with routine healing: Secondary | ICD-10-CM | POA: Diagnosis not present

## 2019-11-05 DIAGNOSIS — Z9181 History of falling: Secondary | ICD-10-CM | POA: Diagnosis not present

## 2019-11-05 DIAGNOSIS — M1990 Unspecified osteoarthritis, unspecified site: Secondary | ICD-10-CM | POA: Diagnosis not present

## 2019-11-05 DIAGNOSIS — K5909 Other constipation: Secondary | ICD-10-CM | POA: Diagnosis not present

## 2019-11-05 DIAGNOSIS — E890 Postprocedural hypothyroidism: Secondary | ICD-10-CM | POA: Diagnosis not present

## 2019-11-05 DIAGNOSIS — I10 Essential (primary) hypertension: Secondary | ICD-10-CM | POA: Diagnosis not present

## 2019-11-05 DIAGNOSIS — Z7952 Long term (current) use of systemic steroids: Secondary | ICD-10-CM | POA: Diagnosis not present

## 2019-11-05 DIAGNOSIS — W19XXXD Unspecified fall, subsequent encounter: Secondary | ICD-10-CM | POA: Diagnosis not present

## 2019-11-05 DIAGNOSIS — K449 Diaphragmatic hernia without obstruction or gangrene: Secondary | ICD-10-CM | POA: Diagnosis not present

## 2019-11-05 DIAGNOSIS — Z7951 Long term (current) use of inhaled steroids: Secondary | ICD-10-CM | POA: Diagnosis not present

## 2019-11-06 DIAGNOSIS — E7849 Other hyperlipidemia: Secondary | ICD-10-CM | POA: Diagnosis not present

## 2019-11-06 DIAGNOSIS — I10 Essential (primary) hypertension: Secondary | ICD-10-CM | POA: Diagnosis not present

## 2019-11-07 DIAGNOSIS — E890 Postprocedural hypothyroidism: Secondary | ICD-10-CM | POA: Diagnosis not present

## 2019-11-07 DIAGNOSIS — Z7951 Long term (current) use of inhaled steroids: Secondary | ICD-10-CM | POA: Diagnosis not present

## 2019-11-07 DIAGNOSIS — J4540 Moderate persistent asthma, uncomplicated: Secondary | ICD-10-CM | POA: Diagnosis not present

## 2019-11-07 DIAGNOSIS — K449 Diaphragmatic hernia without obstruction or gangrene: Secondary | ICD-10-CM | POA: Diagnosis not present

## 2019-11-07 DIAGNOSIS — W19XXXD Unspecified fall, subsequent encounter: Secondary | ICD-10-CM | POA: Diagnosis not present

## 2019-11-07 DIAGNOSIS — Z7952 Long term (current) use of systemic steroids: Secondary | ICD-10-CM | POA: Diagnosis not present

## 2019-11-07 DIAGNOSIS — S72002D Fracture of unspecified part of neck of left femur, subsequent encounter for closed fracture with routine healing: Secondary | ICD-10-CM | POA: Diagnosis not present

## 2019-11-07 DIAGNOSIS — Z9181 History of falling: Secondary | ICD-10-CM | POA: Diagnosis not present

## 2019-11-07 DIAGNOSIS — K219 Gastro-esophageal reflux disease without esophagitis: Secondary | ICD-10-CM | POA: Diagnosis not present

## 2019-11-07 DIAGNOSIS — K5909 Other constipation: Secondary | ICD-10-CM | POA: Diagnosis not present

## 2019-11-07 DIAGNOSIS — M1990 Unspecified osteoarthritis, unspecified site: Secondary | ICD-10-CM | POA: Diagnosis not present

## 2019-11-07 DIAGNOSIS — I10 Essential (primary) hypertension: Secondary | ICD-10-CM | POA: Diagnosis not present

## 2019-11-11 DIAGNOSIS — E890 Postprocedural hypothyroidism: Secondary | ICD-10-CM | POA: Diagnosis not present

## 2019-11-11 DIAGNOSIS — H353122 Nonexudative age-related macular degeneration, left eye, intermediate dry stage: Secondary | ICD-10-CM | POA: Diagnosis not present

## 2019-11-11 DIAGNOSIS — Z961 Presence of intraocular lens: Secondary | ICD-10-CM | POA: Diagnosis not present

## 2019-11-11 DIAGNOSIS — H353113 Nonexudative age-related macular degeneration, right eye, advanced atrophic without subfoveal involvement: Secondary | ICD-10-CM | POA: Diagnosis not present

## 2019-11-11 DIAGNOSIS — H401132 Primary open-angle glaucoma, bilateral, moderate stage: Secondary | ICD-10-CM | POA: Diagnosis not present

## 2019-11-11 DIAGNOSIS — Z7951 Long term (current) use of inhaled steroids: Secondary | ICD-10-CM | POA: Diagnosis not present

## 2019-11-11 DIAGNOSIS — Z7952 Long term (current) use of systemic steroids: Secondary | ICD-10-CM | POA: Diagnosis not present

## 2019-11-11 DIAGNOSIS — K449 Diaphragmatic hernia without obstruction or gangrene: Secondary | ICD-10-CM | POA: Diagnosis not present

## 2019-11-11 DIAGNOSIS — J4540 Moderate persistent asthma, uncomplicated: Secondary | ICD-10-CM | POA: Diagnosis not present

## 2019-11-11 DIAGNOSIS — H524 Presbyopia: Secondary | ICD-10-CM | POA: Diagnosis not present

## 2019-11-11 DIAGNOSIS — K219 Gastro-esophageal reflux disease without esophagitis: Secondary | ICD-10-CM | POA: Diagnosis not present

## 2019-11-11 DIAGNOSIS — Z9181 History of falling: Secondary | ICD-10-CM | POA: Diagnosis not present

## 2019-11-11 DIAGNOSIS — W19XXXD Unspecified fall, subsequent encounter: Secondary | ICD-10-CM | POA: Diagnosis not present

## 2019-11-11 DIAGNOSIS — K5909 Other constipation: Secondary | ICD-10-CM | POA: Diagnosis not present

## 2019-11-11 DIAGNOSIS — S72002D Fracture of unspecified part of neck of left femur, subsequent encounter for closed fracture with routine healing: Secondary | ICD-10-CM | POA: Diagnosis not present

## 2019-11-11 DIAGNOSIS — I10 Essential (primary) hypertension: Secondary | ICD-10-CM | POA: Diagnosis not present

## 2019-11-11 DIAGNOSIS — M1990 Unspecified osteoarthritis, unspecified site: Secondary | ICD-10-CM | POA: Diagnosis not present

## 2019-11-12 DIAGNOSIS — J449 Chronic obstructive pulmonary disease, unspecified: Secondary | ICD-10-CM | POA: Diagnosis not present

## 2019-11-12 DIAGNOSIS — Z6823 Body mass index (BMI) 23.0-23.9, adult: Secondary | ICD-10-CM | POA: Diagnosis not present

## 2019-11-12 DIAGNOSIS — H409 Unspecified glaucoma: Secondary | ICD-10-CM | POA: Diagnosis not present

## 2019-11-12 DIAGNOSIS — M84459S Pathological fracture, hip, unspecified, sequela: Secondary | ICD-10-CM | POA: Diagnosis not present

## 2019-11-13 ENCOUNTER — Ambulatory Visit (INDEPENDENT_AMBULATORY_CARE_PROVIDER_SITE_OTHER): Payer: Medicare Other | Admitting: Orthopedic Surgery

## 2019-11-13 ENCOUNTER — Other Ambulatory Visit: Payer: Self-pay

## 2019-11-13 VITALS — BP 130/74 | HR 84 | Temp 98.4°F

## 2019-11-13 DIAGNOSIS — J4540 Moderate persistent asthma, uncomplicated: Secondary | ICD-10-CM | POA: Diagnosis not present

## 2019-11-13 DIAGNOSIS — E890 Postprocedural hypothyroidism: Secondary | ICD-10-CM | POA: Diagnosis not present

## 2019-11-13 DIAGNOSIS — M79605 Pain in left leg: Secondary | ICD-10-CM

## 2019-11-13 DIAGNOSIS — M1990 Unspecified osteoarthritis, unspecified site: Secondary | ICD-10-CM | POA: Diagnosis not present

## 2019-11-13 DIAGNOSIS — J449 Chronic obstructive pulmonary disease, unspecified: Secondary | ICD-10-CM | POA: Diagnosis not present

## 2019-11-13 DIAGNOSIS — I10 Essential (primary) hypertension: Secondary | ICD-10-CM | POA: Diagnosis not present

## 2019-11-13 DIAGNOSIS — K449 Diaphragmatic hernia without obstruction or gangrene: Secondary | ICD-10-CM | POA: Diagnosis not present

## 2019-11-13 DIAGNOSIS — Z4889 Encounter for other specified surgical aftercare: Secondary | ICD-10-CM

## 2019-11-13 DIAGNOSIS — S72002D Fracture of unspecified part of neck of left femur, subsequent encounter for closed fracture with routine healing: Secondary | ICD-10-CM

## 2019-11-13 DIAGNOSIS — K219 Gastro-esophageal reflux disease without esophagitis: Secondary | ICD-10-CM | POA: Diagnosis not present

## 2019-11-13 DIAGNOSIS — Z9181 History of falling: Secondary | ICD-10-CM | POA: Diagnosis not present

## 2019-11-13 DIAGNOSIS — W19XXXD Unspecified fall, subsequent encounter: Secondary | ICD-10-CM | POA: Diagnosis not present

## 2019-11-13 DIAGNOSIS — M545 Low back pain: Secondary | ICD-10-CM

## 2019-11-13 DIAGNOSIS — K5909 Other constipation: Secondary | ICD-10-CM | POA: Diagnosis not present

## 2019-11-13 DIAGNOSIS — Z7951 Long term (current) use of inhaled steroids: Secondary | ICD-10-CM | POA: Diagnosis not present

## 2019-11-13 DIAGNOSIS — Z7952 Long term (current) use of systemic steroids: Secondary | ICD-10-CM | POA: Diagnosis not present

## 2019-11-13 MED ORDER — GABAPENTIN 100 MG PO CAPS: 100.0000 mg | ORAL_CAPSULE | Freq: Three times a day (TID) | ORAL | 2 refills | Status: DC

## 2019-11-13 MED ORDER — PREDNISONE 10 MG PO TABS: 10.0000 mg | ORAL_TABLET | Freq: Three times a day (TID) | ORAL | 0 refills | Status: DC

## 2019-11-13 NOTE — Patient Instructions (Signed)
Start prednisone 10 mg 3 times a day gabapentin 100 mg 3 times daily and place a heating pad 20 minutes at a time on the back  Follow-up 4 weeks

## 2019-11-13 NOTE — Progress Notes (Signed)
Chief Complaint  Patient presents with  . Follow-up    Hospital follow up on left hip, DOS 10-04-19    Encounter Diagnoses  Name Primary?  . Closed fracture of left hip with routine healing, subsequent encounter   . Aftercare following surgery   . Low back pain radiating to left lower extremity Yes   6 weeks postop / 42 days First postop visit status post bipolar hemiarthroplasty left hip February 26 [employer] [facility] basic hip stem press-fit size 3,  46 head 1.5 neck  The patient has returned home.  She actually complains of lower back pain with occasional radiation into the left leg  She is ambulatory with a walker  Left leg flexion was normal no pain in the hip joint.  All of her pain was in her lower back     Encounter Diagnoses  Name Primary?  . Closed fracture of left hip with routine healing, subsequent encounter   . Aftercare following surgery   . Low back pain radiating to left lower extremity Yes   Her back pain can be addressed medically I will see her in 4 weeks to recheck it   Meds ordered this encounter  Medications  . predniSONE (DELTASONE) 10 MG tablet    Sig: Take 1 tablet (10 mg total) by mouth 3 (three) times daily.    Dispense:  60 tablet    Refill:  0  . gabapentin (NEURONTIN) 100 MG capsule    Sig: Take 1 capsule (100 mg total) by mouth 3 (three) times daily.    Dispense:  90 capsule    Refill:  2

## 2019-11-14 DIAGNOSIS — Z7951 Long term (current) use of inhaled steroids: Secondary | ICD-10-CM | POA: Diagnosis not present

## 2019-11-14 DIAGNOSIS — M1990 Unspecified osteoarthritis, unspecified site: Secondary | ICD-10-CM | POA: Diagnosis not present

## 2019-11-14 DIAGNOSIS — Z9181 History of falling: Secondary | ICD-10-CM | POA: Diagnosis not present

## 2019-11-14 DIAGNOSIS — K219 Gastro-esophageal reflux disease without esophagitis: Secondary | ICD-10-CM | POA: Diagnosis not present

## 2019-11-14 DIAGNOSIS — E890 Postprocedural hypothyroidism: Secondary | ICD-10-CM | POA: Diagnosis not present

## 2019-11-14 DIAGNOSIS — S72002D Fracture of unspecified part of neck of left femur, subsequent encounter for closed fracture with routine healing: Secondary | ICD-10-CM | POA: Diagnosis not present

## 2019-11-14 DIAGNOSIS — K5909 Other constipation: Secondary | ICD-10-CM | POA: Diagnosis not present

## 2019-11-14 DIAGNOSIS — K449 Diaphragmatic hernia without obstruction or gangrene: Secondary | ICD-10-CM | POA: Diagnosis not present

## 2019-11-14 DIAGNOSIS — I10 Essential (primary) hypertension: Secondary | ICD-10-CM | POA: Diagnosis not present

## 2019-11-14 DIAGNOSIS — J4540 Moderate persistent asthma, uncomplicated: Secondary | ICD-10-CM | POA: Diagnosis not present

## 2019-11-14 DIAGNOSIS — Z7952 Long term (current) use of systemic steroids: Secondary | ICD-10-CM | POA: Diagnosis not present

## 2019-11-14 DIAGNOSIS — W19XXXD Unspecified fall, subsequent encounter: Secondary | ICD-10-CM | POA: Diagnosis not present

## 2019-11-15 DIAGNOSIS — I10 Essential (primary) hypertension: Secondary | ICD-10-CM | POA: Diagnosis not present

## 2019-11-15 DIAGNOSIS — K5909 Other constipation: Secondary | ICD-10-CM | POA: Diagnosis not present

## 2019-11-15 DIAGNOSIS — K219 Gastro-esophageal reflux disease without esophagitis: Secondary | ICD-10-CM | POA: Diagnosis not present

## 2019-11-15 DIAGNOSIS — W19XXXD Unspecified fall, subsequent encounter: Secondary | ICD-10-CM | POA: Diagnosis not present

## 2019-11-15 DIAGNOSIS — Z7952 Long term (current) use of systemic steroids: Secondary | ICD-10-CM | POA: Diagnosis not present

## 2019-11-15 DIAGNOSIS — S72002D Fracture of unspecified part of neck of left femur, subsequent encounter for closed fracture with routine healing: Secondary | ICD-10-CM | POA: Diagnosis not present

## 2019-11-15 DIAGNOSIS — Z9181 History of falling: Secondary | ICD-10-CM | POA: Diagnosis not present

## 2019-11-15 DIAGNOSIS — Z7951 Long term (current) use of inhaled steroids: Secondary | ICD-10-CM | POA: Diagnosis not present

## 2019-11-15 DIAGNOSIS — J4540 Moderate persistent asthma, uncomplicated: Secondary | ICD-10-CM | POA: Diagnosis not present

## 2019-11-15 DIAGNOSIS — K449 Diaphragmatic hernia without obstruction or gangrene: Secondary | ICD-10-CM | POA: Diagnosis not present

## 2019-11-15 DIAGNOSIS — E890 Postprocedural hypothyroidism: Secondary | ICD-10-CM | POA: Diagnosis not present

## 2019-11-15 DIAGNOSIS — M1990 Unspecified osteoarthritis, unspecified site: Secondary | ICD-10-CM | POA: Diagnosis not present

## 2019-12-11 ENCOUNTER — Ambulatory Visit (INDEPENDENT_AMBULATORY_CARE_PROVIDER_SITE_OTHER): Payer: Medicare Other | Admitting: Orthopedic Surgery

## 2019-12-11 ENCOUNTER — Encounter: Payer: Self-pay | Admitting: Orthopedic Surgery

## 2019-12-11 ENCOUNTER — Other Ambulatory Visit: Payer: Self-pay

## 2019-12-11 DIAGNOSIS — M1712 Unilateral primary osteoarthritis, left knee: Secondary | ICD-10-CM

## 2019-12-11 DIAGNOSIS — R5383 Other fatigue: Secondary | ICD-10-CM | POA: Diagnosis not present

## 2019-12-11 DIAGNOSIS — S72002D Fracture of unspecified part of neck of left femur, subsequent encounter for closed fracture with routine healing: Secondary | ICD-10-CM

## 2019-12-11 DIAGNOSIS — Z96652 Presence of left artificial knee joint: Secondary | ICD-10-CM

## 2019-12-11 DIAGNOSIS — Z6821 Body mass index (BMI) 21.0-21.9, adult: Secondary | ICD-10-CM | POA: Diagnosis not present

## 2019-12-11 IMAGING — CT CT LUMBAR SPINE WITHOUT CONTRAST
3 series · 12 of 33 positions shown, 14 images · non-contrast
Comparison: CT lumbar spine 09/20/2017. MRI lumbar spine reported
separately.

CLINICAL DATA: Chronic lumbar back pain.

EXAM:
CT LUMBAR SPINE WITHOUT CONTRAST
TECHNIQUE: Multidetector CT imaging of the lumbar spine was performed without
intravenous contrast administration. Multiplanar CT image
reconstructions were also generated.

[Series 4: l spine soft · axial · 0.37mm/px · z∈[+939,+1105]mm · 4 of 121 slices shown, 5 images]
[im 19/121  soft-tissue]
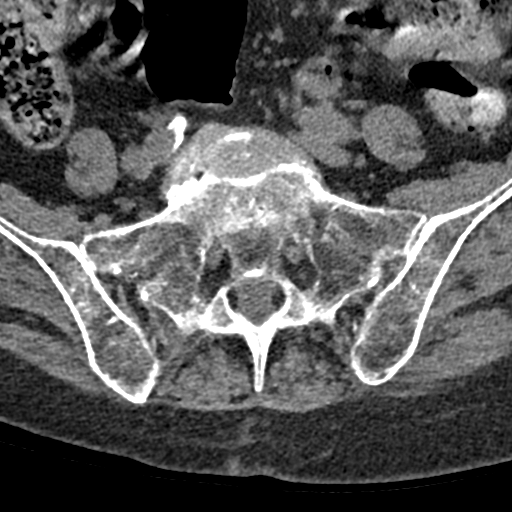
[im 19/121  bone]
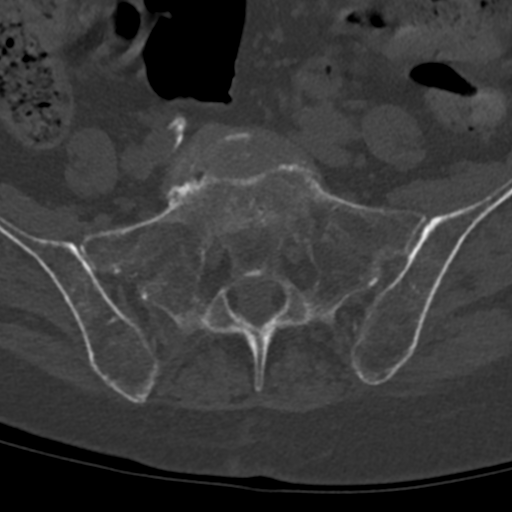
[im 47/121  bone]
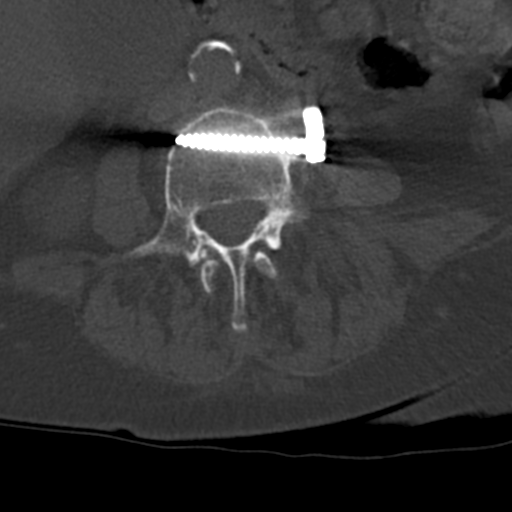
[im 74/121  bone]
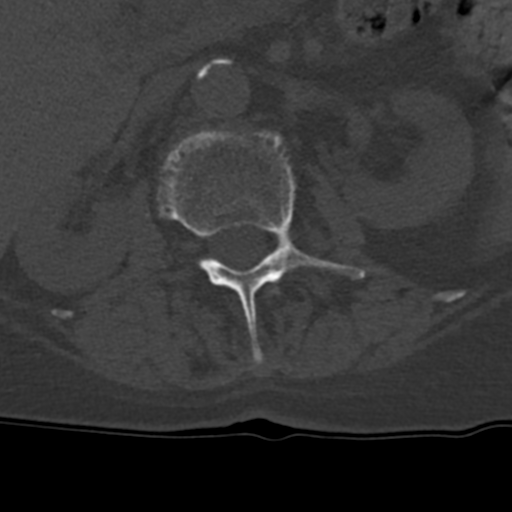
[im 102/121  bone]
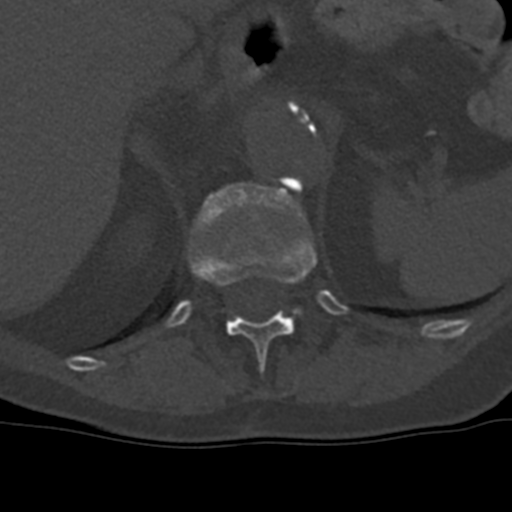

[Series 5: sagittal bone · sagittal · 0.35mm/px · 5 of 72 slices shown, 6 images]
[im 24/72  bone]
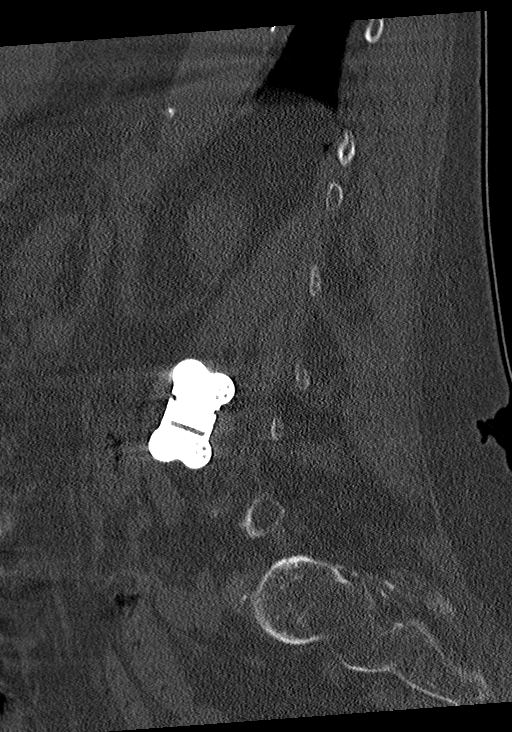
[im 30/72  bone]
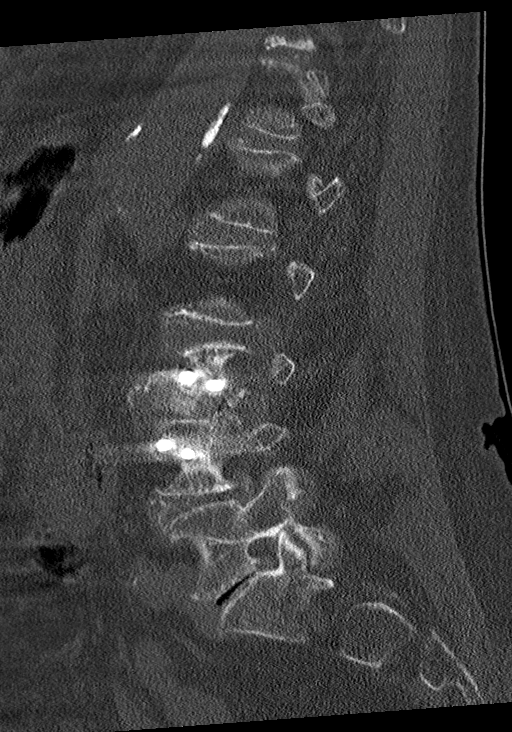
[im 36/72  soft-tissue]
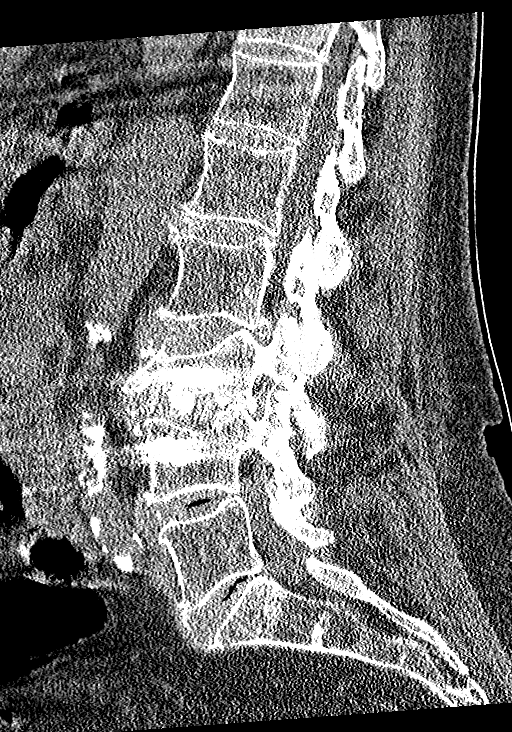
[im 36/72  bone]
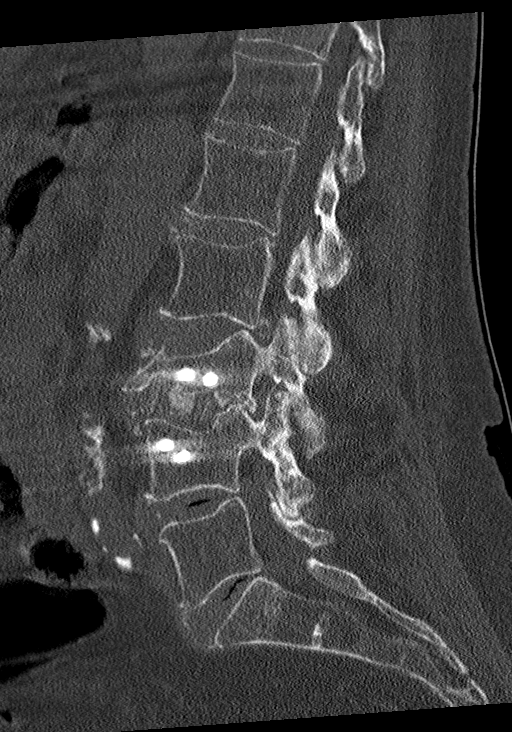
[im 42/72  bone]
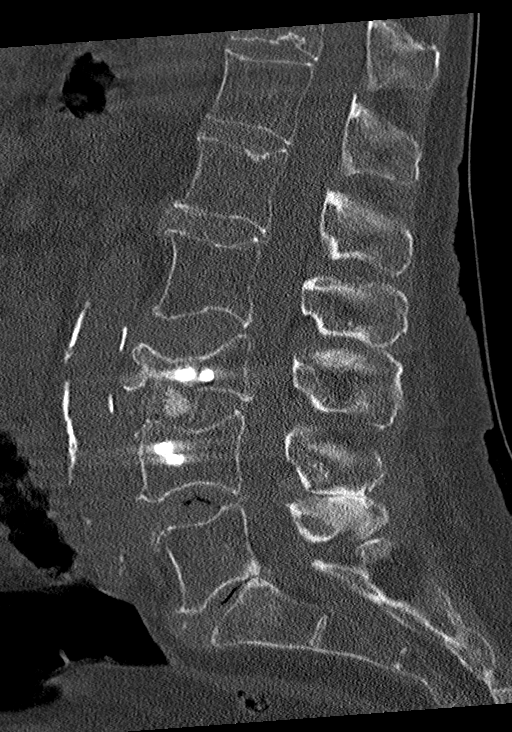
[im 48/72  bone]
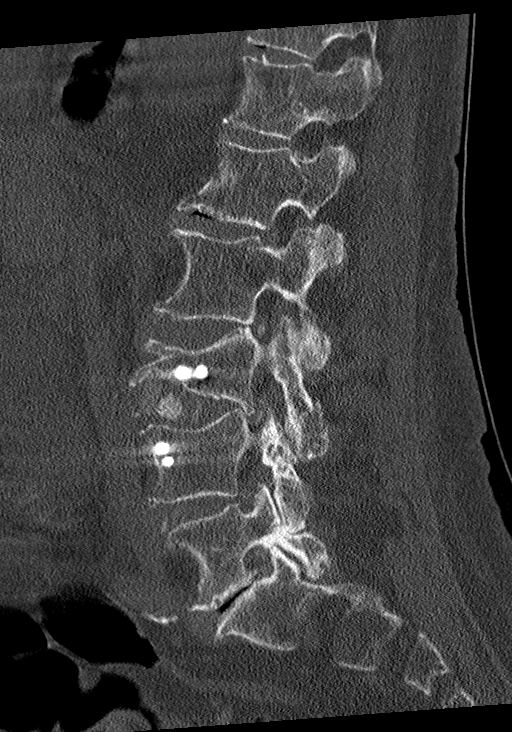

[Series 6: coronal bone · coronal · 0.40mm/px · 3 of 87 slices shown]
[im 18/87  bone]
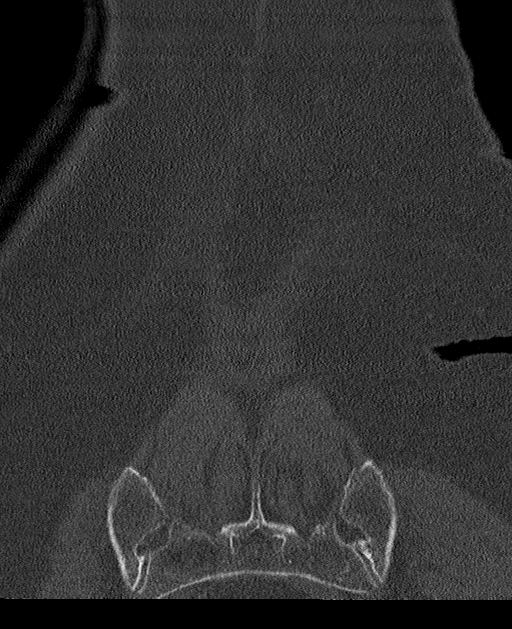
[im 35/87  bone]
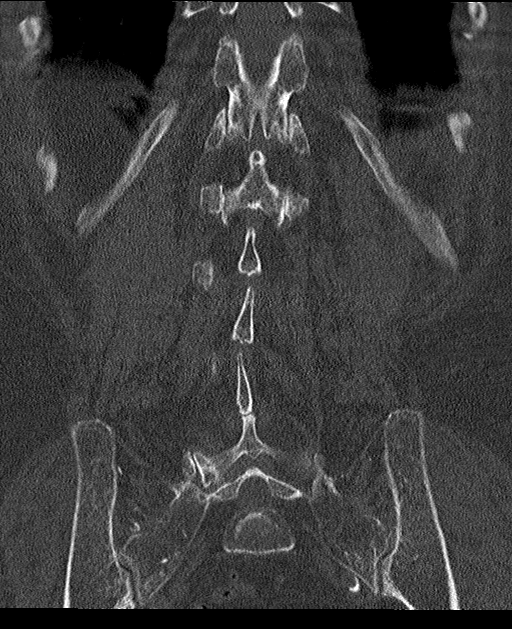
[im 52/87  bone]
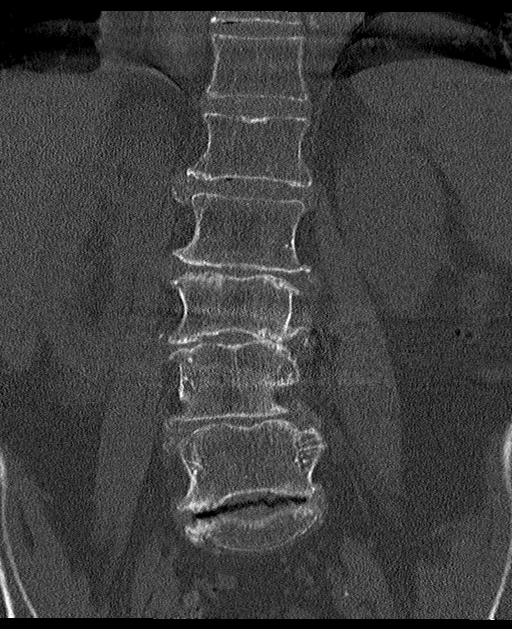

[12 of 33 positions shown; findings below may reference images not displayed]

FINDINGS: Segmentation: Standard. Five lumbar type vertebrae.

Alignment: Essentially anatomic. Trace retrolisthesis related to an
old L3 compression fracture.

Vertebrae: Chronic severe L3 vertebral body compression fracture,
only trace retrolisthesis. Endplate softening superiorly and
inferiorly at L4. Previous lateral plate and screw fixation.
Interbody cage. Pseudarthrosis. Significant screw loosening or
fracture, but there is no interbody bone bridging.

Paraspinal and other soft tissues: Large hiatal hernia, possible
intrathoracic stomach. Prominent gallbladder.

Disc levels:

L1-L2:  Partially calcified disc. No impingement.

L2-L3: Central protrusion, partially calcified. Posterior element
hypertrophy. Moderate stenosis. BILATERAL L2 and L3 neural
impingement.

L3-L4: Disc space narrowing. No solid interbody arthrodesis.
Posterior disc extrusion. Posterior element hypertrophy. Severe
stenosis. BILATERAL L3 and L4 neural impingement.

L4-L5: Central protrusion. Vacuum phenomenon. Preserved disc height.
Posterior element hypertrophy. Severe stenosis. BILATERAL L4 and L5
neural impingement.

L5-S1: Central protrusion. Vacuum phenomenon. Facet arthropathy. No
stenosis or subarticular zone narrowing, but BILATERAL foraminal
narrowing, worse on the LEFT, could affect either L5 nerve root.
IMPRESSION: 1. Suspected pseudarthrosis at L3-4.  No solid interbody bridging.
2. Severe spinal stenosis at L3-4 and L4-5, multifactorial, related
to posterior element hypertrophy and central disc
protrusion/extrusion. These changes appear slightly worse at the
L3-4 level.
3. Chronic deformity L3, severe loss of height, trace retropulsion.
Endplate softening L4. Both stable from priors.
4. Large hiatal hernia, possible intrathoracic stomach.

## 2019-12-11 NOTE — Progress Notes (Signed)
Chief Complaint  Patient presents with  . Routine Post Op    10/04/19 left hip fracture surgery     Encounter Diagnoses  Name Primary?  . Closed fracture of left hip with routine healing, subsequent encounter s/p surgery left hip 10/04/19 Yes  . Status post total left knee replacement     Approaching 3 months after bipolar replacement for left femoral neck fracture.  The patient's caregiver and relatives with her today says that she thinks she is walking funny with the right leg and the patient is complaining of pain in the left leg status post left knee replacement subsequent revision.  She also tells [place] that before she had the hip fracture she had been in a wheelchair and had progressed to where she could use a cane and was walking fairly quickly but since the hip fracture she says that her return to normal activity has been slow  I told her that 3 months after hip fracture in that setting of previous ambulatory problems in an 86 year old is not uncommon  We examined her left knee and found a trace amount of anterior posterior medial lateral instability without effusion  I did not find anything wrong with the right knee other than she is in valgus  Recommend economy hinged wraparound brace in patients  Do not expect full return to normal preinjury activity for at least a year noting that a third of patients will lose some ambulatory function  Encounter Diagnoses  Name Primary?  . Closed fracture of left hip with routine healing, subsequent encounter s/p surgery left hip 10/04/19 Yes  . Status post total left knee replacement

## 2019-12-11 NOTE — Patient Instructions (Signed)
BRACE FOR RIGHT KNEE

## 2019-12-13 DIAGNOSIS — J449 Chronic obstructive pulmonary disease, unspecified: Secondary | ICD-10-CM | POA: Diagnosis not present

## 2020-01-13 DIAGNOSIS — J449 Chronic obstructive pulmonary disease, unspecified: Secondary | ICD-10-CM | POA: Diagnosis not present

## 2020-01-14 DIAGNOSIS — K5900 Constipation, unspecified: Secondary | ICD-10-CM | POA: Diagnosis not present

## 2020-01-14 DIAGNOSIS — J301 Allergic rhinitis due to pollen: Secondary | ICD-10-CM | POA: Diagnosis not present

## 2020-01-14 DIAGNOSIS — Z6822 Body mass index (BMI) 22.0-22.9, adult: Secondary | ICD-10-CM | POA: Diagnosis not present

## 2020-02-12 DIAGNOSIS — J449 Chronic obstructive pulmonary disease, unspecified: Secondary | ICD-10-CM | POA: Diagnosis not present

## 2020-02-18 DIAGNOSIS — K5900 Constipation, unspecified: Secondary | ICD-10-CM | POA: Diagnosis not present

## 2020-02-18 DIAGNOSIS — J449 Chronic obstructive pulmonary disease, unspecified: Secondary | ICD-10-CM | POA: Diagnosis not present

## 2020-02-18 DIAGNOSIS — Z0000 Encounter for general adult medical examination without abnormal findings: Secondary | ICD-10-CM | POA: Diagnosis not present

## 2020-02-18 DIAGNOSIS — J301 Allergic rhinitis due to pollen: Secondary | ICD-10-CM | POA: Diagnosis not present

## 2020-02-18 DIAGNOSIS — Z6821 Body mass index (BMI) 21.0-21.9, adult: Secondary | ICD-10-CM | POA: Diagnosis not present

## 2020-03-06 DIAGNOSIS — N2889 Other specified disorders of kidney and ureter: Secondary | ICD-10-CM | POA: Diagnosis not present

## 2020-03-06 DIAGNOSIS — M1611 Unilateral primary osteoarthritis, right hip: Secondary | ICD-10-CM | POA: Diagnosis not present

## 2020-03-06 DIAGNOSIS — E860 Dehydration: Secondary | ICD-10-CM | POA: Diagnosis not present

## 2020-03-06 DIAGNOSIS — H409 Unspecified glaucoma: Secondary | ICD-10-CM | POA: Diagnosis not present

## 2020-03-06 DIAGNOSIS — E876 Hypokalemia: Secondary | ICD-10-CM | POA: Diagnosis not present

## 2020-03-06 DIAGNOSIS — G8929 Other chronic pain: Secondary | ICD-10-CM | POA: Diagnosis not present

## 2020-03-06 DIAGNOSIS — J45909 Unspecified asthma, uncomplicated: Secondary | ICD-10-CM | POA: Diagnosis not present

## 2020-03-06 DIAGNOSIS — I700 Atherosclerosis of aorta: Secondary | ICD-10-CM | POA: Diagnosis not present

## 2020-03-06 DIAGNOSIS — B370 Candidal stomatitis: Secondary | ICD-10-CM | POA: Diagnosis not present

## 2020-03-06 DIAGNOSIS — I080 Rheumatic disorders of both mitral and aortic valves: Secondary | ICD-10-CM | POA: Diagnosis not present

## 2020-03-06 DIAGNOSIS — K449 Diaphragmatic hernia without obstruction or gangrene: Secondary | ICD-10-CM | POA: Diagnosis not present

## 2020-03-06 DIAGNOSIS — J441 Chronic obstructive pulmonary disease with (acute) exacerbation: Secondary | ICD-10-CM | POA: Diagnosis not present

## 2020-03-06 DIAGNOSIS — J3489 Other specified disorders of nose and nasal sinuses: Secondary | ICD-10-CM | POA: Diagnosis not present

## 2020-03-06 DIAGNOSIS — J9811 Atelectasis: Secondary | ICD-10-CM | POA: Diagnosis not present

## 2020-03-06 DIAGNOSIS — R2689 Other abnormalities of gait and mobility: Secondary | ICD-10-CM | POA: Diagnosis not present

## 2020-03-06 DIAGNOSIS — E039 Hypothyroidism, unspecified: Secondary | ICD-10-CM | POA: Diagnosis not present

## 2020-03-06 DIAGNOSIS — J9601 Acute respiratory failure with hypoxia: Secondary | ICD-10-CM | POA: Diagnosis not present

## 2020-03-06 DIAGNOSIS — K440 Diaphragmatic hernia with obstruction, without gangrene: Secondary | ICD-10-CM | POA: Diagnosis not present

## 2020-03-06 DIAGNOSIS — R739 Hyperglycemia, unspecified: Secondary | ICD-10-CM | POA: Diagnosis not present

## 2020-03-06 DIAGNOSIS — K219 Gastro-esophageal reflux disease without esophagitis: Secondary | ICD-10-CM | POA: Diagnosis not present

## 2020-03-06 DIAGNOSIS — N320 Bladder-neck obstruction: Secondary | ICD-10-CM | POA: Diagnosis not present

## 2020-03-06 DIAGNOSIS — R1030 Lower abdominal pain, unspecified: Secondary | ICD-10-CM | POA: Diagnosis not present

## 2020-03-06 DIAGNOSIS — J90 Pleural effusion, not elsewhere classified: Secondary | ICD-10-CM | POA: Diagnosis not present

## 2020-03-06 DIAGNOSIS — R5381 Other malaise: Secondary | ICD-10-CM | POA: Diagnosis not present

## 2020-03-06 DIAGNOSIS — E46 Unspecified protein-calorie malnutrition: Secondary | ICD-10-CM | POA: Diagnosis not present

## 2020-03-06 DIAGNOSIS — R1310 Dysphagia, unspecified: Secondary | ICD-10-CM | POA: Diagnosis not present

## 2020-03-06 DIAGNOSIS — J329 Chronic sinusitis, unspecified: Secondary | ICD-10-CM | POA: Diagnosis not present

## 2020-03-06 DIAGNOSIS — R627 Adult failure to thrive: Secondary | ICD-10-CM | POA: Diagnosis not present

## 2020-03-06 DIAGNOSIS — R531 Weakness: Secondary | ICD-10-CM | POA: Diagnosis not present

## 2020-03-06 DIAGNOSIS — Z6821 Body mass index (BMI) 21.0-21.9, adult: Secondary | ICD-10-CM | POA: Diagnosis not present

## 2020-03-06 DIAGNOSIS — Z981 Arthrodesis status: Secondary | ICD-10-CM | POA: Diagnosis not present

## 2020-03-06 DIAGNOSIS — I10 Essential (primary) hypertension: Secondary | ICD-10-CM | POA: Diagnosis not present

## 2020-03-06 DIAGNOSIS — D72829 Elevated white blood cell count, unspecified: Secondary | ICD-10-CM | POA: Diagnosis not present

## 2020-03-06 DIAGNOSIS — E870 Hyperosmolality and hypernatremia: Secondary | ICD-10-CM | POA: Diagnosis not present

## 2020-03-06 DIAGNOSIS — K5641 Fecal impaction: Secondary | ICD-10-CM | POA: Diagnosis not present

## 2020-03-06 DIAGNOSIS — J189 Pneumonia, unspecified organism: Secondary | ICD-10-CM | POA: Diagnosis not present

## 2020-03-06 DIAGNOSIS — S2221XA Fracture of manubrium, initial encounter for closed fracture: Secondary | ICD-10-CM | POA: Diagnosis not present

## 2020-03-06 DIAGNOSIS — M4856XA Collapsed vertebra, not elsewhere classified, lumbar region, initial encounter for fracture: Secondary | ICD-10-CM | POA: Diagnosis not present

## 2020-03-06 DIAGNOSIS — R0902 Hypoxemia: Secondary | ICD-10-CM | POA: Diagnosis not present

## 2020-03-06 DIAGNOSIS — I517 Cardiomegaly: Secondary | ICD-10-CM | POA: Diagnosis not present

## 2020-03-06 DIAGNOSIS — J9621 Acute and chronic respiratory failure with hypoxia: Secondary | ICD-10-CM | POA: Diagnosis not present

## 2020-03-06 DIAGNOSIS — J690 Pneumonitis due to inhalation of food and vomit: Secondary | ICD-10-CM | POA: Diagnosis not present

## 2020-03-06 DIAGNOSIS — E878 Other disorders of electrolyte and fluid balance, not elsewhere classified: Secondary | ICD-10-CM | POA: Diagnosis not present

## 2020-03-06 DIAGNOSIS — I2510 Atherosclerotic heart disease of native coronary artery without angina pectoris: Secondary | ICD-10-CM | POA: Diagnosis not present

## 2020-03-06 DIAGNOSIS — Z9981 Dependence on supplemental oxygen: Secondary | ICD-10-CM | POA: Diagnosis not present

## 2020-03-06 DIAGNOSIS — M533 Sacrococcygeal disorders, not elsewhere classified: Secondary | ICD-10-CM | POA: Diagnosis not present

## 2020-03-06 DIAGNOSIS — Z8739 Personal history of other diseases of the musculoskeletal system and connective tissue: Secondary | ICD-10-CM | POA: Diagnosis not present

## 2020-03-06 DIAGNOSIS — M6281 Muscle weakness (generalized): Secondary | ICD-10-CM | POA: Diagnosis not present

## 2020-03-06 DIAGNOSIS — R1312 Dysphagia, oropharyngeal phase: Secondary | ICD-10-CM | POA: Diagnosis not present

## 2020-03-06 DIAGNOSIS — R918 Other nonspecific abnormal finding of lung field: Secondary | ICD-10-CM | POA: Diagnosis not present

## 2020-03-06 DIAGNOSIS — Z20822 Contact with and (suspected) exposure to covid-19: Secondary | ICD-10-CM | POA: Diagnosis not present

## 2020-03-11 ENCOUNTER — Encounter: Payer: Self-pay | Admitting: Orthopedic Surgery

## 2020-03-11 ENCOUNTER — Ambulatory Visit: Payer: Medicare Other | Admitting: Orthopedic Surgery

## 2020-03-11 DIAGNOSIS — S72002D Fracture of unspecified part of neck of left femur, subsequent encounter for closed fracture with routine healing: Secondary | ICD-10-CM

## 2020-03-20 DIAGNOSIS — K219 Gastro-esophageal reflux disease without esophagitis: Secondary | ICD-10-CM | POA: Diagnosis not present

## 2020-03-20 DIAGNOSIS — S2221XA Fracture of manubrium, initial encounter for closed fracture: Secondary | ICD-10-CM | POA: Diagnosis not present

## 2020-03-20 DIAGNOSIS — R1312 Dysphagia, oropharyngeal phase: Secondary | ICD-10-CM | POA: Diagnosis not present

## 2020-03-20 DIAGNOSIS — I10 Essential (primary) hypertension: Secondary | ICD-10-CM | POA: Diagnosis not present

## 2020-03-20 DIAGNOSIS — K440 Diaphragmatic hernia with obstruction, without gangrene: Secondary | ICD-10-CM | POA: Diagnosis not present

## 2020-03-20 DIAGNOSIS — E039 Hypothyroidism, unspecified: Secondary | ICD-10-CM | POA: Diagnosis not present

## 2020-03-20 DIAGNOSIS — I700 Atherosclerosis of aorta: Secondary | ICD-10-CM | POA: Diagnosis not present

## 2020-03-20 DIAGNOSIS — J9621 Acute and chronic respiratory failure with hypoxia: Secondary | ICD-10-CM | POA: Diagnosis not present

## 2020-03-20 DIAGNOSIS — R531 Weakness: Secondary | ICD-10-CM | POA: Diagnosis not present

## 2020-03-20 DIAGNOSIS — Z9981 Dependence on supplemental oxygen: Secondary | ICD-10-CM | POA: Diagnosis not present

## 2020-03-20 DIAGNOSIS — H409 Unspecified glaucoma: Secondary | ICD-10-CM | POA: Diagnosis not present

## 2020-03-20 DIAGNOSIS — G8929 Other chronic pain: Secondary | ICD-10-CM | POA: Diagnosis not present

## 2020-03-20 DIAGNOSIS — K449 Diaphragmatic hernia without obstruction or gangrene: Secondary | ICD-10-CM | POA: Diagnosis not present

## 2020-03-20 DIAGNOSIS — J441 Chronic obstructive pulmonary disease with (acute) exacerbation: Secondary | ICD-10-CM | POA: Diagnosis not present

## 2020-03-20 DIAGNOSIS — J90 Pleural effusion, not elsewhere classified: Secondary | ICD-10-CM | POA: Diagnosis not present

## 2020-03-20 DIAGNOSIS — R2689 Other abnormalities of gait and mobility: Secondary | ICD-10-CM | POA: Diagnosis not present

## 2020-03-20 DIAGNOSIS — J1289 Other viral pneumonia: Secondary | ICD-10-CM | POA: Diagnosis not present

## 2020-03-20 DIAGNOSIS — E46 Unspecified protein-calorie malnutrition: Secondary | ICD-10-CM | POA: Diagnosis not present

## 2020-03-20 DIAGNOSIS — R627 Adult failure to thrive: Secondary | ICD-10-CM | POA: Diagnosis not present

## 2020-03-20 DIAGNOSIS — M6281 Muscle weakness (generalized): Secondary | ICD-10-CM | POA: Diagnosis not present

## 2020-03-20 DIAGNOSIS — J449 Chronic obstructive pulmonary disease, unspecified: Secondary | ICD-10-CM | POA: Diagnosis not present

## 2020-03-20 DIAGNOSIS — J690 Pneumonitis due to inhalation of food and vomit: Secondary | ICD-10-CM | POA: Diagnosis not present

## 2020-03-20 DIAGNOSIS — R5381 Other malaise: Secondary | ICD-10-CM | POA: Diagnosis not present

## 2020-03-22 DIAGNOSIS — I10 Essential (primary) hypertension: Secondary | ICD-10-CM | POA: Diagnosis not present

## 2020-03-22 DIAGNOSIS — J1289 Other viral pneumonia: Secondary | ICD-10-CM | POA: Diagnosis not present

## 2020-03-22 DIAGNOSIS — J449 Chronic obstructive pulmonary disease, unspecified: Secondary | ICD-10-CM | POA: Diagnosis not present

## 2020-04-09 DIAGNOSIS — E46 Unspecified protein-calorie malnutrition: Secondary | ICD-10-CM | POA: Diagnosis not present

## 2020-04-09 DIAGNOSIS — J9602 Acute respiratory failure with hypercapnia: Secondary | ICD-10-CM | POA: Diagnosis not present

## 2020-04-09 DIAGNOSIS — J440 Chronic obstructive pulmonary disease with acute lower respiratory infection: Secondary | ICD-10-CM | POA: Diagnosis not present

## 2020-04-09 DIAGNOSIS — M810 Age-related osteoporosis without current pathological fracture: Secondary | ICD-10-CM | POA: Diagnosis not present

## 2020-04-09 DIAGNOSIS — E039 Hypothyroidism, unspecified: Secondary | ICD-10-CM | POA: Diagnosis not present

## 2020-04-09 DIAGNOSIS — J690 Pneumonitis due to inhalation of food and vomit: Secondary | ICD-10-CM | POA: Diagnosis not present

## 2020-04-09 DIAGNOSIS — Z96642 Presence of left artificial hip joint: Secondary | ICD-10-CM | POA: Diagnosis not present

## 2020-04-09 DIAGNOSIS — Z96659 Presence of unspecified artificial knee joint: Secondary | ICD-10-CM | POA: Diagnosis not present

## 2020-04-09 DIAGNOSIS — R1310 Dysphagia, unspecified: Secondary | ICD-10-CM | POA: Diagnosis not present

## 2020-04-09 DIAGNOSIS — I10 Essential (primary) hypertension: Secondary | ICD-10-CM | POA: Diagnosis not present

## 2020-04-09 DIAGNOSIS — K219 Gastro-esophageal reflux disease without esophagitis: Secondary | ICD-10-CM | POA: Diagnosis not present

## 2020-04-09 DIAGNOSIS — Z9181 History of falling: Secondary | ICD-10-CM | POA: Diagnosis not present

## 2020-04-11 DIAGNOSIS — I10 Essential (primary) hypertension: Secondary | ICD-10-CM | POA: Diagnosis not present

## 2020-04-11 DIAGNOSIS — Z96659 Presence of unspecified artificial knee joint: Secondary | ICD-10-CM | POA: Diagnosis not present

## 2020-04-11 DIAGNOSIS — Z96642 Presence of left artificial hip joint: Secondary | ICD-10-CM | POA: Diagnosis not present

## 2020-04-11 DIAGNOSIS — E039 Hypothyroidism, unspecified: Secondary | ICD-10-CM | POA: Diagnosis not present

## 2020-04-11 DIAGNOSIS — K219 Gastro-esophageal reflux disease without esophagitis: Secondary | ICD-10-CM | POA: Diagnosis not present

## 2020-04-11 DIAGNOSIS — R1310 Dysphagia, unspecified: Secondary | ICD-10-CM | POA: Diagnosis not present

## 2020-04-11 DIAGNOSIS — E46 Unspecified protein-calorie malnutrition: Secondary | ICD-10-CM | POA: Diagnosis not present

## 2020-04-11 DIAGNOSIS — Z9181 History of falling: Secondary | ICD-10-CM | POA: Diagnosis not present

## 2020-04-11 DIAGNOSIS — J690 Pneumonitis due to inhalation of food and vomit: Secondary | ICD-10-CM | POA: Diagnosis not present

## 2020-04-11 DIAGNOSIS — J9602 Acute respiratory failure with hypercapnia: Secondary | ICD-10-CM | POA: Diagnosis not present

## 2020-04-11 DIAGNOSIS — J440 Chronic obstructive pulmonary disease with acute lower respiratory infection: Secondary | ICD-10-CM | POA: Diagnosis not present

## 2020-04-11 DIAGNOSIS — M810 Age-related osteoporosis without current pathological fracture: Secondary | ICD-10-CM | POA: Diagnosis not present

## 2020-04-14 DIAGNOSIS — K219 Gastro-esophageal reflux disease without esophagitis: Secondary | ICD-10-CM | POA: Diagnosis not present

## 2020-04-14 DIAGNOSIS — Z96659 Presence of unspecified artificial knee joint: Secondary | ICD-10-CM | POA: Diagnosis not present

## 2020-04-14 DIAGNOSIS — E46 Unspecified protein-calorie malnutrition: Secondary | ICD-10-CM | POA: Diagnosis not present

## 2020-04-14 DIAGNOSIS — J440 Chronic obstructive pulmonary disease with acute lower respiratory infection: Secondary | ICD-10-CM | POA: Diagnosis not present

## 2020-04-14 DIAGNOSIS — I10 Essential (primary) hypertension: Secondary | ICD-10-CM | POA: Diagnosis not present

## 2020-04-14 DIAGNOSIS — R1310 Dysphagia, unspecified: Secondary | ICD-10-CM | POA: Diagnosis not present

## 2020-04-14 DIAGNOSIS — Z9181 History of falling: Secondary | ICD-10-CM | POA: Diagnosis not present

## 2020-04-14 DIAGNOSIS — J690 Pneumonitis due to inhalation of food and vomit: Secondary | ICD-10-CM | POA: Diagnosis not present

## 2020-04-14 DIAGNOSIS — M810 Age-related osteoporosis without current pathological fracture: Secondary | ICD-10-CM | POA: Diagnosis not present

## 2020-04-14 DIAGNOSIS — E039 Hypothyroidism, unspecified: Secondary | ICD-10-CM | POA: Diagnosis not present

## 2020-04-14 DIAGNOSIS — J9602 Acute respiratory failure with hypercapnia: Secondary | ICD-10-CM | POA: Diagnosis not present

## 2020-04-14 DIAGNOSIS — Z96642 Presence of left artificial hip joint: Secondary | ICD-10-CM | POA: Diagnosis not present

## 2020-04-16 DIAGNOSIS — Z9181 History of falling: Secondary | ICD-10-CM | POA: Diagnosis not present

## 2020-04-16 DIAGNOSIS — Z96659 Presence of unspecified artificial knee joint: Secondary | ICD-10-CM | POA: Diagnosis not present

## 2020-04-16 DIAGNOSIS — J440 Chronic obstructive pulmonary disease with acute lower respiratory infection: Secondary | ICD-10-CM | POA: Diagnosis not present

## 2020-04-16 DIAGNOSIS — E039 Hypothyroidism, unspecified: Secondary | ICD-10-CM | POA: Diagnosis not present

## 2020-04-16 DIAGNOSIS — K219 Gastro-esophageal reflux disease without esophagitis: Secondary | ICD-10-CM | POA: Diagnosis not present

## 2020-04-16 DIAGNOSIS — J690 Pneumonitis due to inhalation of food and vomit: Secondary | ICD-10-CM | POA: Diagnosis not present

## 2020-04-16 DIAGNOSIS — I10 Essential (primary) hypertension: Secondary | ICD-10-CM | POA: Diagnosis not present

## 2020-04-16 DIAGNOSIS — M810 Age-related osteoporosis without current pathological fracture: Secondary | ICD-10-CM | POA: Diagnosis not present

## 2020-04-16 DIAGNOSIS — Z96642 Presence of left artificial hip joint: Secondary | ICD-10-CM | POA: Diagnosis not present

## 2020-04-16 DIAGNOSIS — J9602 Acute respiratory failure with hypercapnia: Secondary | ICD-10-CM | POA: Diagnosis not present

## 2020-04-16 DIAGNOSIS — R1310 Dysphagia, unspecified: Secondary | ICD-10-CM | POA: Diagnosis not present

## 2020-04-16 DIAGNOSIS — E46 Unspecified protein-calorie malnutrition: Secondary | ICD-10-CM | POA: Diagnosis not present

## 2020-04-17 DIAGNOSIS — E46 Unspecified protein-calorie malnutrition: Secondary | ICD-10-CM | POA: Diagnosis not present

## 2020-04-17 DIAGNOSIS — Z96659 Presence of unspecified artificial knee joint: Secondary | ICD-10-CM | POA: Diagnosis not present

## 2020-04-17 DIAGNOSIS — Z9181 History of falling: Secondary | ICD-10-CM | POA: Diagnosis not present

## 2020-04-17 DIAGNOSIS — K219 Gastro-esophageal reflux disease without esophagitis: Secondary | ICD-10-CM | POA: Diagnosis not present

## 2020-04-17 DIAGNOSIS — J690 Pneumonitis due to inhalation of food and vomit: Secondary | ICD-10-CM | POA: Diagnosis not present

## 2020-04-17 DIAGNOSIS — M810 Age-related osteoporosis without current pathological fracture: Secondary | ICD-10-CM | POA: Diagnosis not present

## 2020-04-17 DIAGNOSIS — J440 Chronic obstructive pulmonary disease with acute lower respiratory infection: Secondary | ICD-10-CM | POA: Diagnosis not present

## 2020-04-17 DIAGNOSIS — Z96642 Presence of left artificial hip joint: Secondary | ICD-10-CM | POA: Diagnosis not present

## 2020-04-17 DIAGNOSIS — I10 Essential (primary) hypertension: Secondary | ICD-10-CM | POA: Diagnosis not present

## 2020-04-17 DIAGNOSIS — E039 Hypothyroidism, unspecified: Secondary | ICD-10-CM | POA: Diagnosis not present

## 2020-04-17 DIAGNOSIS — R1310 Dysphagia, unspecified: Secondary | ICD-10-CM | POA: Diagnosis not present

## 2020-04-17 DIAGNOSIS — J9602 Acute respiratory failure with hypercapnia: Secondary | ICD-10-CM | POA: Diagnosis not present

## 2020-04-20 DIAGNOSIS — J449 Chronic obstructive pulmonary disease, unspecified: Secondary | ICD-10-CM | POA: Diagnosis not present

## 2020-04-20 DIAGNOSIS — Z0000 Encounter for general adult medical examination without abnormal findings: Secondary | ICD-10-CM | POA: Diagnosis not present

## 2020-04-20 DIAGNOSIS — Z6821 Body mass index (BMI) 21.0-21.9, adult: Secondary | ICD-10-CM | POA: Diagnosis not present

## 2020-04-22 DIAGNOSIS — I10 Essential (primary) hypertension: Secondary | ICD-10-CM | POA: Diagnosis not present

## 2020-04-22 DIAGNOSIS — Z96659 Presence of unspecified artificial knee joint: Secondary | ICD-10-CM | POA: Diagnosis not present

## 2020-04-22 DIAGNOSIS — E039 Hypothyroidism, unspecified: Secondary | ICD-10-CM | POA: Diagnosis not present

## 2020-04-22 DIAGNOSIS — Z96642 Presence of left artificial hip joint: Secondary | ICD-10-CM | POA: Diagnosis not present

## 2020-04-22 DIAGNOSIS — J9602 Acute respiratory failure with hypercapnia: Secondary | ICD-10-CM | POA: Diagnosis not present

## 2020-04-22 DIAGNOSIS — Z9181 History of falling: Secondary | ICD-10-CM | POA: Diagnosis not present

## 2020-04-22 DIAGNOSIS — E46 Unspecified protein-calorie malnutrition: Secondary | ICD-10-CM | POA: Diagnosis not present

## 2020-04-22 DIAGNOSIS — J690 Pneumonitis due to inhalation of food and vomit: Secondary | ICD-10-CM | POA: Diagnosis not present

## 2020-04-22 DIAGNOSIS — R1310 Dysphagia, unspecified: Secondary | ICD-10-CM | POA: Diagnosis not present

## 2020-04-22 DIAGNOSIS — M810 Age-related osteoporosis without current pathological fracture: Secondary | ICD-10-CM | POA: Diagnosis not present

## 2020-04-22 DIAGNOSIS — J440 Chronic obstructive pulmonary disease with acute lower respiratory infection: Secondary | ICD-10-CM | POA: Diagnosis not present

## 2020-04-22 DIAGNOSIS — K219 Gastro-esophageal reflux disease without esophagitis: Secondary | ICD-10-CM | POA: Diagnosis not present

## 2020-04-23 DIAGNOSIS — J690 Pneumonitis due to inhalation of food and vomit: Secondary | ICD-10-CM | POA: Diagnosis not present

## 2020-04-23 DIAGNOSIS — M810 Age-related osteoporosis without current pathological fracture: Secondary | ICD-10-CM | POA: Diagnosis not present

## 2020-04-23 DIAGNOSIS — J440 Chronic obstructive pulmonary disease with acute lower respiratory infection: Secondary | ICD-10-CM | POA: Diagnosis not present

## 2020-04-23 DIAGNOSIS — E039 Hypothyroidism, unspecified: Secondary | ICD-10-CM | POA: Diagnosis not present

## 2020-04-23 DIAGNOSIS — E46 Unspecified protein-calorie malnutrition: Secondary | ICD-10-CM | POA: Diagnosis not present

## 2020-04-23 DIAGNOSIS — Z96642 Presence of left artificial hip joint: Secondary | ICD-10-CM | POA: Diagnosis not present

## 2020-04-23 DIAGNOSIS — R1310 Dysphagia, unspecified: Secondary | ICD-10-CM | POA: Diagnosis not present

## 2020-04-23 DIAGNOSIS — J9602 Acute respiratory failure with hypercapnia: Secondary | ICD-10-CM | POA: Diagnosis not present

## 2020-04-23 DIAGNOSIS — Z96659 Presence of unspecified artificial knee joint: Secondary | ICD-10-CM | POA: Diagnosis not present

## 2020-04-23 DIAGNOSIS — I10 Essential (primary) hypertension: Secondary | ICD-10-CM | POA: Diagnosis not present

## 2020-04-23 DIAGNOSIS — Z9181 History of falling: Secondary | ICD-10-CM | POA: Diagnosis not present

## 2020-04-23 DIAGNOSIS — K219 Gastro-esophageal reflux disease without esophagitis: Secondary | ICD-10-CM | POA: Diagnosis not present

## 2020-04-27 DIAGNOSIS — Z96642 Presence of left artificial hip joint: Secondary | ICD-10-CM | POA: Diagnosis not present

## 2020-04-27 DIAGNOSIS — R1310 Dysphagia, unspecified: Secondary | ICD-10-CM | POA: Diagnosis not present

## 2020-04-27 DIAGNOSIS — J440 Chronic obstructive pulmonary disease with acute lower respiratory infection: Secondary | ICD-10-CM | POA: Diagnosis not present

## 2020-04-27 DIAGNOSIS — M810 Age-related osteoporosis without current pathological fracture: Secondary | ICD-10-CM | POA: Diagnosis not present

## 2020-04-27 DIAGNOSIS — Z96659 Presence of unspecified artificial knee joint: Secondary | ICD-10-CM | POA: Diagnosis not present

## 2020-04-27 DIAGNOSIS — J690 Pneumonitis due to inhalation of food and vomit: Secondary | ICD-10-CM | POA: Diagnosis not present

## 2020-04-27 DIAGNOSIS — E46 Unspecified protein-calorie malnutrition: Secondary | ICD-10-CM | POA: Diagnosis not present

## 2020-04-27 DIAGNOSIS — Z9181 History of falling: Secondary | ICD-10-CM | POA: Diagnosis not present

## 2020-04-27 DIAGNOSIS — E039 Hypothyroidism, unspecified: Secondary | ICD-10-CM | POA: Diagnosis not present

## 2020-04-27 DIAGNOSIS — I10 Essential (primary) hypertension: Secondary | ICD-10-CM | POA: Diagnosis not present

## 2020-04-27 DIAGNOSIS — J9602 Acute respiratory failure with hypercapnia: Secondary | ICD-10-CM | POA: Diagnosis not present

## 2020-04-27 DIAGNOSIS — K219 Gastro-esophageal reflux disease without esophagitis: Secondary | ICD-10-CM | POA: Diagnosis not present

## 2020-04-28 DIAGNOSIS — Z13820 Encounter for screening for osteoporosis: Secondary | ICD-10-CM | POA: Diagnosis not present

## 2020-04-28 DIAGNOSIS — M810 Age-related osteoporosis without current pathological fracture: Secondary | ICD-10-CM | POA: Diagnosis not present

## 2020-04-29 DIAGNOSIS — Z96659 Presence of unspecified artificial knee joint: Secondary | ICD-10-CM | POA: Diagnosis not present

## 2020-04-29 DIAGNOSIS — M810 Age-related osteoporosis without current pathological fracture: Secondary | ICD-10-CM | POA: Diagnosis not present

## 2020-04-29 DIAGNOSIS — E46 Unspecified protein-calorie malnutrition: Secondary | ICD-10-CM | POA: Diagnosis not present

## 2020-04-29 DIAGNOSIS — R1310 Dysphagia, unspecified: Secondary | ICD-10-CM | POA: Diagnosis not present

## 2020-04-29 DIAGNOSIS — J690 Pneumonitis due to inhalation of food and vomit: Secondary | ICD-10-CM | POA: Diagnosis not present

## 2020-04-29 DIAGNOSIS — Z96642 Presence of left artificial hip joint: Secondary | ICD-10-CM | POA: Diagnosis not present

## 2020-04-29 DIAGNOSIS — E039 Hypothyroidism, unspecified: Secondary | ICD-10-CM | POA: Diagnosis not present

## 2020-04-29 DIAGNOSIS — K219 Gastro-esophageal reflux disease without esophagitis: Secondary | ICD-10-CM | POA: Diagnosis not present

## 2020-04-29 DIAGNOSIS — J9602 Acute respiratory failure with hypercapnia: Secondary | ICD-10-CM | POA: Diagnosis not present

## 2020-04-29 DIAGNOSIS — J440 Chronic obstructive pulmonary disease with acute lower respiratory infection: Secondary | ICD-10-CM | POA: Diagnosis not present

## 2020-04-29 DIAGNOSIS — Z9181 History of falling: Secondary | ICD-10-CM | POA: Diagnosis not present

## 2020-04-29 DIAGNOSIS — I10 Essential (primary) hypertension: Secondary | ICD-10-CM | POA: Diagnosis not present

## 2020-05-05 DIAGNOSIS — R1310 Dysphagia, unspecified: Secondary | ICD-10-CM | POA: Diagnosis not present

## 2020-05-05 DIAGNOSIS — J9602 Acute respiratory failure with hypercapnia: Secondary | ICD-10-CM | POA: Diagnosis not present

## 2020-05-05 DIAGNOSIS — E46 Unspecified protein-calorie malnutrition: Secondary | ICD-10-CM | POA: Diagnosis not present

## 2020-05-05 DIAGNOSIS — J690 Pneumonitis due to inhalation of food and vomit: Secondary | ICD-10-CM | POA: Diagnosis not present

## 2020-05-05 DIAGNOSIS — J440 Chronic obstructive pulmonary disease with acute lower respiratory infection: Secondary | ICD-10-CM | POA: Diagnosis not present

## 2020-05-05 DIAGNOSIS — K219 Gastro-esophageal reflux disease without esophagitis: Secondary | ICD-10-CM | POA: Diagnosis not present

## 2020-05-05 DIAGNOSIS — Z96659 Presence of unspecified artificial knee joint: Secondary | ICD-10-CM | POA: Diagnosis not present

## 2020-05-05 DIAGNOSIS — Z9181 History of falling: Secondary | ICD-10-CM | POA: Diagnosis not present

## 2020-05-05 DIAGNOSIS — I10 Essential (primary) hypertension: Secondary | ICD-10-CM | POA: Diagnosis not present

## 2020-05-05 DIAGNOSIS — M810 Age-related osteoporosis without current pathological fracture: Secondary | ICD-10-CM | POA: Diagnosis not present

## 2020-05-05 DIAGNOSIS — E039 Hypothyroidism, unspecified: Secondary | ICD-10-CM | POA: Diagnosis not present

## 2020-05-05 DIAGNOSIS — Z96642 Presence of left artificial hip joint: Secondary | ICD-10-CM | POA: Diagnosis not present

## 2020-05-08 DIAGNOSIS — Z96659 Presence of unspecified artificial knee joint: Secondary | ICD-10-CM | POA: Diagnosis not present

## 2020-05-08 DIAGNOSIS — I10 Essential (primary) hypertension: Secondary | ICD-10-CM | POA: Diagnosis not present

## 2020-05-08 DIAGNOSIS — E039 Hypothyroidism, unspecified: Secondary | ICD-10-CM | POA: Diagnosis not present

## 2020-05-08 DIAGNOSIS — Z96642 Presence of left artificial hip joint: Secondary | ICD-10-CM | POA: Diagnosis not present

## 2020-05-08 DIAGNOSIS — J690 Pneumonitis due to inhalation of food and vomit: Secondary | ICD-10-CM | POA: Diagnosis not present

## 2020-05-08 DIAGNOSIS — M810 Age-related osteoporosis without current pathological fracture: Secondary | ICD-10-CM | POA: Diagnosis not present

## 2020-05-08 DIAGNOSIS — Z9181 History of falling: Secondary | ICD-10-CM | POA: Diagnosis not present

## 2020-05-08 DIAGNOSIS — E46 Unspecified protein-calorie malnutrition: Secondary | ICD-10-CM | POA: Diagnosis not present

## 2020-05-08 DIAGNOSIS — K219 Gastro-esophageal reflux disease without esophagitis: Secondary | ICD-10-CM | POA: Diagnosis not present

## 2020-05-08 DIAGNOSIS — R1310 Dysphagia, unspecified: Secondary | ICD-10-CM | POA: Diagnosis not present

## 2020-05-08 DIAGNOSIS — J9602 Acute respiratory failure with hypercapnia: Secondary | ICD-10-CM | POA: Diagnosis not present

## 2020-05-08 DIAGNOSIS — J440 Chronic obstructive pulmonary disease with acute lower respiratory infection: Secondary | ICD-10-CM | POA: Diagnosis not present

## 2020-05-11 DIAGNOSIS — Z96659 Presence of unspecified artificial knee joint: Secondary | ICD-10-CM | POA: Diagnosis not present

## 2020-05-11 DIAGNOSIS — R1310 Dysphagia, unspecified: Secondary | ICD-10-CM | POA: Diagnosis not present

## 2020-05-11 DIAGNOSIS — J440 Chronic obstructive pulmonary disease with acute lower respiratory infection: Secondary | ICD-10-CM | POA: Diagnosis not present

## 2020-05-11 DIAGNOSIS — M810 Age-related osteoporosis without current pathological fracture: Secondary | ICD-10-CM | POA: Diagnosis not present

## 2020-05-11 DIAGNOSIS — J690 Pneumonitis due to inhalation of food and vomit: Secondary | ICD-10-CM | POA: Diagnosis not present

## 2020-05-11 DIAGNOSIS — E039 Hypothyroidism, unspecified: Secondary | ICD-10-CM | POA: Diagnosis not present

## 2020-05-11 DIAGNOSIS — E46 Unspecified protein-calorie malnutrition: Secondary | ICD-10-CM | POA: Diagnosis not present

## 2020-05-11 DIAGNOSIS — Z9181 History of falling: Secondary | ICD-10-CM | POA: Diagnosis not present

## 2020-05-11 DIAGNOSIS — J9602 Acute respiratory failure with hypercapnia: Secondary | ICD-10-CM | POA: Diagnosis not present

## 2020-05-11 DIAGNOSIS — Z96642 Presence of left artificial hip joint: Secondary | ICD-10-CM | POA: Diagnosis not present

## 2020-05-11 DIAGNOSIS — K219 Gastro-esophageal reflux disease without esophagitis: Secondary | ICD-10-CM | POA: Diagnosis not present

## 2020-05-11 DIAGNOSIS — I10 Essential (primary) hypertension: Secondary | ICD-10-CM | POA: Diagnosis not present

## 2020-05-13 DIAGNOSIS — I10 Essential (primary) hypertension: Secondary | ICD-10-CM | POA: Diagnosis not present

## 2020-05-13 DIAGNOSIS — E46 Unspecified protein-calorie malnutrition: Secondary | ICD-10-CM | POA: Diagnosis not present

## 2020-05-13 DIAGNOSIS — R1310 Dysphagia, unspecified: Secondary | ICD-10-CM | POA: Diagnosis not present

## 2020-05-13 DIAGNOSIS — Z9181 History of falling: Secondary | ICD-10-CM | POA: Diagnosis not present

## 2020-05-13 DIAGNOSIS — M810 Age-related osteoporosis without current pathological fracture: Secondary | ICD-10-CM | POA: Diagnosis not present

## 2020-05-13 DIAGNOSIS — K219 Gastro-esophageal reflux disease without esophagitis: Secondary | ICD-10-CM | POA: Diagnosis not present

## 2020-05-13 DIAGNOSIS — J440 Chronic obstructive pulmonary disease with acute lower respiratory infection: Secondary | ICD-10-CM | POA: Diagnosis not present

## 2020-05-13 DIAGNOSIS — Z96659 Presence of unspecified artificial knee joint: Secondary | ICD-10-CM | POA: Diagnosis not present

## 2020-05-13 DIAGNOSIS — E039 Hypothyroidism, unspecified: Secondary | ICD-10-CM | POA: Diagnosis not present

## 2020-05-13 DIAGNOSIS — J690 Pneumonitis due to inhalation of food and vomit: Secondary | ICD-10-CM | POA: Diagnosis not present

## 2020-05-13 DIAGNOSIS — J9602 Acute respiratory failure with hypercapnia: Secondary | ICD-10-CM | POA: Diagnosis not present

## 2020-05-13 DIAGNOSIS — Z96642 Presence of left artificial hip joint: Secondary | ICD-10-CM | POA: Diagnosis not present

## 2020-05-19 DIAGNOSIS — M810 Age-related osteoporosis without current pathological fracture: Secondary | ICD-10-CM | POA: Diagnosis not present

## 2020-05-19 DIAGNOSIS — Z9181 History of falling: Secondary | ICD-10-CM | POA: Diagnosis not present

## 2020-05-19 DIAGNOSIS — J690 Pneumonitis due to inhalation of food and vomit: Secondary | ICD-10-CM | POA: Diagnosis not present

## 2020-05-19 DIAGNOSIS — K219 Gastro-esophageal reflux disease without esophagitis: Secondary | ICD-10-CM | POA: Diagnosis not present

## 2020-05-19 DIAGNOSIS — Z96659 Presence of unspecified artificial knee joint: Secondary | ICD-10-CM | POA: Diagnosis not present

## 2020-05-19 DIAGNOSIS — Z96642 Presence of left artificial hip joint: Secondary | ICD-10-CM | POA: Diagnosis not present

## 2020-05-19 DIAGNOSIS — J440 Chronic obstructive pulmonary disease with acute lower respiratory infection: Secondary | ICD-10-CM | POA: Diagnosis not present

## 2020-05-19 DIAGNOSIS — I10 Essential (primary) hypertension: Secondary | ICD-10-CM | POA: Diagnosis not present

## 2020-05-19 DIAGNOSIS — R1310 Dysphagia, unspecified: Secondary | ICD-10-CM | POA: Diagnosis not present

## 2020-05-19 DIAGNOSIS — J9602 Acute respiratory failure with hypercapnia: Secondary | ICD-10-CM | POA: Diagnosis not present

## 2020-05-19 DIAGNOSIS — E46 Unspecified protein-calorie malnutrition: Secondary | ICD-10-CM | POA: Diagnosis not present

## 2020-05-19 DIAGNOSIS — E039 Hypothyroidism, unspecified: Secondary | ICD-10-CM | POA: Diagnosis not present

## 2020-05-21 DIAGNOSIS — Z96642 Presence of left artificial hip joint: Secondary | ICD-10-CM | POA: Diagnosis not present

## 2020-05-21 DIAGNOSIS — E039 Hypothyroidism, unspecified: Secondary | ICD-10-CM | POA: Diagnosis not present

## 2020-05-21 DIAGNOSIS — Z9181 History of falling: Secondary | ICD-10-CM | POA: Diagnosis not present

## 2020-05-21 DIAGNOSIS — Z96659 Presence of unspecified artificial knee joint: Secondary | ICD-10-CM | POA: Diagnosis not present

## 2020-05-21 DIAGNOSIS — J690 Pneumonitis due to inhalation of food and vomit: Secondary | ICD-10-CM | POA: Diagnosis not present

## 2020-05-21 DIAGNOSIS — R1310 Dysphagia, unspecified: Secondary | ICD-10-CM | POA: Diagnosis not present

## 2020-05-21 DIAGNOSIS — J440 Chronic obstructive pulmonary disease with acute lower respiratory infection: Secondary | ICD-10-CM | POA: Diagnosis not present

## 2020-05-21 DIAGNOSIS — M810 Age-related osteoporosis without current pathological fracture: Secondary | ICD-10-CM | POA: Diagnosis not present

## 2020-05-21 DIAGNOSIS — J9602 Acute respiratory failure with hypercapnia: Secondary | ICD-10-CM | POA: Diagnosis not present

## 2020-05-21 DIAGNOSIS — I10 Essential (primary) hypertension: Secondary | ICD-10-CM | POA: Diagnosis not present

## 2020-05-21 DIAGNOSIS — E46 Unspecified protein-calorie malnutrition: Secondary | ICD-10-CM | POA: Diagnosis not present

## 2020-05-21 DIAGNOSIS — K219 Gastro-esophageal reflux disease without esophagitis: Secondary | ICD-10-CM | POA: Diagnosis not present

## 2020-07-20 DIAGNOSIS — J449 Chronic obstructive pulmonary disease, unspecified: Secondary | ICD-10-CM | POA: Diagnosis not present

## 2020-07-20 DIAGNOSIS — Z6820 Body mass index (BMI) 20.0-20.9, adult: Secondary | ICD-10-CM | POA: Diagnosis not present

## 2020-07-20 DIAGNOSIS — E039 Hypothyroidism, unspecified: Secondary | ICD-10-CM | POA: Diagnosis not present

## 2020-08-05 DIAGNOSIS — Z6820 Body mass index (BMI) 20.0-20.9, adult: Secondary | ICD-10-CM | POA: Diagnosis not present

## 2020-08-05 DIAGNOSIS — J440 Chronic obstructive pulmonary disease with acute lower respiratory infection: Secondary | ICD-10-CM | POA: Diagnosis not present

## 2020-09-25 DIAGNOSIS — H353123 Nonexudative age-related macular degeneration, left eye, advanced atrophic without subfoveal involvement: Secondary | ICD-10-CM | POA: Diagnosis not present

## 2020-09-25 DIAGNOSIS — H353114 Nonexudative age-related macular degeneration, right eye, advanced atrophic with subfoveal involvement: Secondary | ICD-10-CM | POA: Diagnosis not present

## 2020-09-25 DIAGNOSIS — H47393 Other disorders of optic disc, bilateral: Secondary | ICD-10-CM | POA: Diagnosis not present

## 2020-10-03 DIAGNOSIS — R0781 Pleurodynia: Secondary | ICD-10-CM | POA: Diagnosis not present

## 2020-11-04 DIAGNOSIS — E039 Hypothyroidism, unspecified: Secondary | ICD-10-CM | POA: Diagnosis not present

## 2020-11-04 DIAGNOSIS — I10 Essential (primary) hypertension: Secondary | ICD-10-CM | POA: Diagnosis not present

## 2020-11-04 DIAGNOSIS — J449 Chronic obstructive pulmonary disease, unspecified: Secondary | ICD-10-CM | POA: Diagnosis not present

## 2020-11-11 DIAGNOSIS — E039 Hypothyroidism, unspecified: Secondary | ICD-10-CM | POA: Diagnosis not present

## 2020-11-11 DIAGNOSIS — J440 Chronic obstructive pulmonary disease with acute lower respiratory infection: Secondary | ICD-10-CM | POA: Diagnosis not present

## 2020-11-11 DIAGNOSIS — E7849 Other hyperlipidemia: Secondary | ICD-10-CM | POA: Diagnosis not present

## 2020-11-11 DIAGNOSIS — I10 Essential (primary) hypertension: Secondary | ICD-10-CM | POA: Diagnosis not present

## 2020-11-11 DIAGNOSIS — Z6820 Body mass index (BMI) 20.0-20.9, adult: Secondary | ICD-10-CM | POA: Diagnosis not present

## 2020-11-11 DIAGNOSIS — Z0000 Encounter for general adult medical examination without abnormal findings: Secondary | ICD-10-CM | POA: Diagnosis not present

## 2020-11-11 DIAGNOSIS — J449 Chronic obstructive pulmonary disease, unspecified: Secondary | ICD-10-CM | POA: Diagnosis not present

## 2020-12-05 DIAGNOSIS — J449 Chronic obstructive pulmonary disease, unspecified: Secondary | ICD-10-CM | POA: Diagnosis not present

## 2020-12-05 DIAGNOSIS — E039 Hypothyroidism, unspecified: Secondary | ICD-10-CM | POA: Diagnosis not present

## 2020-12-05 DIAGNOSIS — I10 Essential (primary) hypertension: Secondary | ICD-10-CM | POA: Diagnosis not present

## 2021-01-21 DIAGNOSIS — K449 Diaphragmatic hernia without obstruction or gangrene: Secondary | ICD-10-CM | POA: Diagnosis not present

## 2021-01-21 DIAGNOSIS — Z6821 Body mass index (BMI) 21.0-21.9, adult: Secondary | ICD-10-CM | POA: Diagnosis not present

## 2021-01-21 DIAGNOSIS — R5383 Other fatigue: Secondary | ICD-10-CM | POA: Diagnosis not present

## 2021-01-21 DIAGNOSIS — R0602 Shortness of breath: Secondary | ICD-10-CM | POA: Diagnosis not present

## 2021-01-21 DIAGNOSIS — J440 Chronic obstructive pulmonary disease with acute lower respiratory infection: Secondary | ICD-10-CM | POA: Diagnosis not present

## 2021-02-04 DIAGNOSIS — I10 Essential (primary) hypertension: Secondary | ICD-10-CM | POA: Diagnosis not present

## 2021-02-04 DIAGNOSIS — E038 Other specified hypothyroidism: Secondary | ICD-10-CM | POA: Diagnosis not present

## 2021-03-02 ENCOUNTER — Other Ambulatory Visit: Payer: Self-pay

## 2021-03-02 ENCOUNTER — Encounter (HOSPITAL_COMMUNITY): Payer: Self-pay | Admitting: Radiology

## 2021-03-02 ENCOUNTER — Emergency Department (HOSPITAL_COMMUNITY): Payer: Medicare Other

## 2021-03-02 ENCOUNTER — Inpatient Hospital Stay (HOSPITAL_COMMUNITY)
Admission: EM | Admit: 2021-03-02 | Discharge: 2021-03-05 | DRG: 871 | Disposition: A | Discharge status: Home Health | Payer: Medicare Other | Attending: Family Medicine | Admitting: Family Medicine

## 2021-03-02 DIAGNOSIS — R531 Weakness: Secondary | ICD-10-CM

## 2021-03-02 DIAGNOSIS — J189 Pneumonia, unspecified organism: Secondary | ICD-10-CM | POA: Diagnosis present

## 2021-03-02 DIAGNOSIS — Z20822 Contact with and (suspected) exposure to covid-19: Secondary | ICD-10-CM | POA: Diagnosis not present

## 2021-03-02 DIAGNOSIS — I10 Essential (primary) hypertension: Secondary | ICD-10-CM | POA: Diagnosis not present

## 2021-03-02 DIAGNOSIS — K219 Gastro-esophageal reflux disease without esophagitis: Secondary | ICD-10-CM | POA: Diagnosis present

## 2021-03-02 DIAGNOSIS — R7989 Other specified abnormal findings of blood chemistry: Secondary | ICD-10-CM | POA: Diagnosis not present

## 2021-03-02 DIAGNOSIS — A419 Sepsis, unspecified organism: Principal | ICD-10-CM | POA: Diagnosis present

## 2021-03-02 DIAGNOSIS — R6510 Systemic inflammatory response syndrome (SIRS) of non-infectious origin without acute organ dysfunction: Secondary | ICD-10-CM | POA: Diagnosis present

## 2021-03-02 DIAGNOSIS — Z7951 Long term (current) use of inhaled steroids: Secondary | ICD-10-CM

## 2021-03-02 DIAGNOSIS — K449 Diaphragmatic hernia without obstruction or gangrene: Secondary | ICD-10-CM | POA: Diagnosis present

## 2021-03-02 DIAGNOSIS — Z85828 Personal history of other malignant neoplasm of skin: Secondary | ICD-10-CM

## 2021-03-02 DIAGNOSIS — R0602 Shortness of breath: Secondary | ICD-10-CM | POA: Diagnosis not present

## 2021-03-02 DIAGNOSIS — E890 Postprocedural hypothyroidism: Secondary | ICD-10-CM | POA: Diagnosis present

## 2021-03-02 DIAGNOSIS — J4540 Moderate persistent asthma, uncomplicated: Secondary | ICD-10-CM

## 2021-03-02 DIAGNOSIS — Z90710 Acquired absence of both cervix and uterus: Secondary | ICD-10-CM | POA: Diagnosis not present

## 2021-03-02 DIAGNOSIS — F339 Major depressive disorder, recurrent, unspecified: Secondary | ICD-10-CM | POA: Diagnosis present

## 2021-03-02 DIAGNOSIS — I5042 Chronic combined systolic (congestive) and diastolic (congestive) heart failure: Secondary | ICD-10-CM | POA: Diagnosis present

## 2021-03-02 DIAGNOSIS — M1990 Unspecified osteoarthritis, unspecified site: Secondary | ICD-10-CM | POA: Diagnosis present

## 2021-03-02 DIAGNOSIS — I110 Hypertensive heart disease with heart failure: Secondary | ICD-10-CM | POA: Diagnosis present

## 2021-03-02 DIAGNOSIS — Z79890 Hormone replacement therapy (postmenopausal): Secondary | ICD-10-CM

## 2021-03-02 DIAGNOSIS — M810 Age-related osteoporosis without current pathological fracture: Secondary | ICD-10-CM | POA: Diagnosis not present

## 2021-03-02 DIAGNOSIS — J45909 Unspecified asthma, uncomplicated: Secondary | ICD-10-CM | POA: Diagnosis present

## 2021-03-02 DIAGNOSIS — I878 Other specified disorders of veins: Secondary | ICD-10-CM | POA: Diagnosis present

## 2021-03-02 DIAGNOSIS — J4541 Moderate persistent asthma with (acute) exacerbation: Secondary | ICD-10-CM | POA: Diagnosis not present

## 2021-03-02 DIAGNOSIS — Z7952 Long term (current) use of systemic steroids: Secondary | ICD-10-CM

## 2021-03-02 DIAGNOSIS — E039 Hypothyroidism, unspecified: Secondary | ICD-10-CM | POA: Diagnosis not present

## 2021-03-02 DIAGNOSIS — E785 Hyperlipidemia, unspecified: Secondary | ICD-10-CM | POA: Diagnosis present

## 2021-03-02 DIAGNOSIS — R0989 Other specified symptoms and signs involving the circulatory and respiratory systems: Secondary | ICD-10-CM | POA: Diagnosis not present

## 2021-03-02 DIAGNOSIS — R059 Cough, unspecified: Secondary | ICD-10-CM | POA: Diagnosis not present

## 2021-03-02 LAB — URINALYSIS, ROUTINE W REFLEX MICROSCOPIC
Bilirubin Urine: NEGATIVE
Glucose, UA: NEGATIVE mg/dL
Ketones, ur: NEGATIVE mg/dL
Nitrite: POSITIVE — AB
Protein, ur: NEGATIVE mg/dL
Specific Gravity, Urine: 1.026 (ref 1.005–1.030)
pH: 6 (ref 5.0–8.0)

## 2021-03-02 LAB — CBC WITH DIFFERENTIAL/PLATELET
Abs Immature Granulocytes: 0.07 10*3/uL (ref 0.00–0.07)
Basophils Absolute: 0.1 10*3/uL (ref 0.0–0.1)
Basophils Relative: 1 %
Eosinophils Absolute: 0 10*3/uL (ref 0.0–0.5)
Eosinophils Relative: 0 %
HCT: 41.3 % (ref 36.0–46.0)
Hemoglobin: 13.1 g/dL (ref 12.0–15.0)
Immature Granulocytes: 1 %
Lymphocytes Relative: 5 %
Lymphs Abs: 0.7 10*3/uL (ref 0.7–4.0)
MCH: 32.1 pg (ref 26.0–34.0)
MCHC: 31.7 g/dL (ref 30.0–36.0)
MCV: 101.2 fL — ABNORMAL HIGH (ref 80.0–100.0)
Monocytes Absolute: 1.5 10*3/uL — ABNORMAL HIGH (ref 0.1–1.0)
Monocytes Relative: 10 %
Neutro Abs: 13 10*3/uL — ABNORMAL HIGH (ref 1.7–7.7)
Neutrophils Relative %: 83 %
Platelets: 301 10*3/uL (ref 150–400)
RBC: 4.08 MIL/uL (ref 3.87–5.11)
RDW: 13.6 % (ref 11.5–15.5)
WBC: 15.4 10*3/uL — ABNORMAL HIGH (ref 4.0–10.5)
nRBC: 0 % (ref 0.0–0.2)

## 2021-03-02 LAB — BRAIN NATRIURETIC PEPTIDE
B Natriuretic Peptide: 200 pg/mL — ABNORMAL HIGH (ref 0.0–100.0)
B Natriuretic Peptide: 265 pg/mL — ABNORMAL HIGH (ref 0.0–100.0)

## 2021-03-02 LAB — RESP PANEL BY RT-PCR (FLU A&B, COVID) ARPGX2
Influenza A by PCR: NEGATIVE
Influenza B by PCR: NEGATIVE
SARS Coronavirus 2 by RT PCR: NEGATIVE

## 2021-03-02 LAB — COMPREHENSIVE METABOLIC PANEL
ALT: 13 U/L (ref 0–44)
AST: 14 U/L — ABNORMAL LOW (ref 15–41)
Albumin: 3.4 g/dL — ABNORMAL LOW (ref 3.5–5.0)
Alkaline Phosphatase: 72 U/L (ref 38–126)
Anion gap: 8 (ref 5–15)
BUN: 22 mg/dL (ref 8–23)
CO2: 22 mmol/L (ref 22–32)
Calcium: 10.1 mg/dL (ref 8.9–10.3)
Chloride: 105 mmol/L (ref 98–111)
Creatinine, Ser: 0.81 mg/dL (ref 0.44–1.00)
GFR, Estimated: >60 mL/min (ref >60)
Glucose, Bld: 100 mg/dL — ABNORMAL HIGH (ref 70–99)
Potassium: 3.9 mmol/L (ref 3.5–5.1)
Sodium: 135 mmol/L (ref 135–145)
Total Bilirubin: 0.4 mg/dL (ref 0.3–1.2)
Total Protein: 7.1 g/dL (ref 6.5–8.1)

## 2021-03-02 LAB — LACTIC ACID, PLASMA
Lactic Acid, Venous: 1.8 mmol/L (ref 0.5–1.9)
Lactic Acid, Venous: 1.3 mmol/L (ref 0.5–1.9)
Lactic Acid, Venous: 1.9 mmol/L (ref 0.5–1.9)

## 2021-03-02 LAB — TROPONIN I (HIGH SENSITIVITY)
Troponin I (High Sensitivity): 8 ng/L (ref <18)
Troponin I (High Sensitivity): 7 ng/L (ref <18)

## 2021-03-02 LAB — CBC
HCT: 37.1 % (ref 36.0–46.0)
Hemoglobin: 11.7 g/dL — ABNORMAL LOW (ref 12.0–15.0)
MCH: 31.4 pg (ref 26.0–34.0)
MCHC: 31.5 g/dL (ref 30.0–36.0)
MCV: 99.5 fL (ref 80.0–100.0)
Platelets: 255 10*3/uL (ref 150–400)
RBC: 3.73 MIL/uL — ABNORMAL LOW (ref 3.87–5.11)
RDW: 13.5 % (ref 11.5–15.5)
WBC: 14.3 10*3/uL — ABNORMAL HIGH (ref 4.0–10.5)
nRBC: 0 % (ref 0.0–0.2)

## 2021-03-02 LAB — D-DIMER, QUANTITATIVE: D-Dimer, Quant: 0.55 ug/mL-FEU — ABNORMAL HIGH (ref 0.00–0.50)

## 2021-03-02 LAB — PROTIME-INR
INR: 1 (ref 0.8–1.2)
Prothrombin Time: 13.4 seconds (ref 11.4–15.2)

## 2021-03-02 LAB — PROCALCITONIN: Procalcitonin: 0.16 ng/mL

## 2021-03-02 LAB — APTT: aPTT: 30 seconds (ref 24–36)

## 2021-03-02 LAB — CREATININE, SERUM
Creatinine, Ser: 0.69 mg/dL (ref 0.44–1.00)
GFR, Estimated: >60 mL/min (ref >60)

## 2021-03-02 MED ORDER — SODIUM CHLORIDE 0.9 % IV SOLN
2.0000 g | INTRAVENOUS | Status: DC
  Administered 2021-03-03 – 2021-03-05 (×3): 2 g via INTRAVENOUS
  Filled 2021-03-02 (×3): qty 20

## 2021-03-02 MED ORDER — IOHEXOL 350 MG/ML SOLN
100.0000 mL | Freq: Once | INTRAVENOUS | Status: AC | PRN
  Administered 2021-03-02: 100 mL via INTRAVENOUS

## 2021-03-02 MED ORDER — HYDRALAZINE HCL 20 MG/ML IJ SOLN: 10.0000 mg | INTRAMUSCULAR | Status: DC | PRN

## 2021-03-02 MED ORDER — OXYCODONE HCL 5 MG PO TABS: 5.0000 mg | ORAL_TABLET | ORAL | Status: DC | PRN

## 2021-03-02 MED ORDER — ACETAMINOPHEN 650 MG RE SUPP: 650.0000 mg | Freq: Four times a day (QID) | RECTAL | Status: DC | PRN

## 2021-03-02 MED ORDER — ACETAMINOPHEN 325 MG PO TABS
650.0000 mg | ORAL_TABLET | Freq: Four times a day (QID) | ORAL | Status: DC | PRN
  Administered 2021-03-02 – 2021-03-05 (×2): 650 mg via ORAL
  Filled 2021-03-02 (×2): qty 2

## 2021-03-02 MED ORDER — JUVEN PO PACK
1.0000 | PACK | Freq: Two times a day (BID) | ORAL | Status: DC
  Administered 2021-03-03 – 2021-03-05 (×5): 1 via ORAL
  Filled 2021-03-02 (×6): qty 1

## 2021-03-02 MED ORDER — SODIUM CHLORIDE 0.9 % IV SOLN: INTRAVENOUS | Status: AC

## 2021-03-02 MED ORDER — ONDANSETRON HCL 4 MG/2ML IJ SOLN: 4.0000 mg | Freq: Four times a day (QID) | INTRAMUSCULAR | Status: DC | PRN

## 2021-03-02 MED ORDER — IPRATROPIUM BROMIDE 0.02 % IN SOLN: 0.5000 mg | Freq: Four times a day (QID) | RESPIRATORY_TRACT | Status: DC | PRN

## 2021-03-02 MED ORDER — HEPARIN SODIUM (PORCINE) 5000 UNIT/ML IJ SOLN
5000.0000 [IU] | Freq: Three times a day (TID) | INTRAMUSCULAR | Status: DC
  Administered 2021-03-02 – 2021-03-05 (×10): 5000 [IU] via SUBCUTANEOUS
  Filled 2021-03-02 (×10): qty 1

## 2021-03-02 MED ORDER — PANTOPRAZOLE SODIUM 40 MG PO TBEC
40.0000 mg | DELAYED_RELEASE_TABLET | Freq: Every day | ORAL | Status: DC
  Administered 2021-03-02 – 2021-03-05 (×4): 40 mg via ORAL
  Filled 2021-03-02 (×4): qty 1

## 2021-03-02 MED ORDER — LACTATED RINGERS IV BOLUS (SEPSIS)
250.0000 mL | Freq: Once | INTRAVENOUS | Status: AC
  Administered 2021-03-02: 250 mL via INTRAVENOUS

## 2021-03-02 MED ORDER — SODIUM CHLORIDE 0.9 % IV SOLN
1.0000 g | Freq: Once | INTRAVENOUS | Status: AC
  Administered 2021-03-02: 1 g via INTRAVENOUS
  Filled 2021-03-02: qty 10

## 2021-03-02 MED ORDER — LACTATED RINGERS IV BOLUS (SEPSIS)
1000.0000 mL | Freq: Once | INTRAVENOUS | Status: AC
  Administered 2021-03-02: 1000 mL via INTRAVENOUS

## 2021-03-02 MED ORDER — SERTRALINE HCL 50 MG PO TABS
25.0000 mg | ORAL_TABLET | Freq: Every day | ORAL | Status: DC
  Administered 2021-03-02 – 2021-03-05 (×4): 25 mg via ORAL
  Filled 2021-03-02 (×4): qty 1

## 2021-03-02 MED ORDER — TRAZODONE HCL 50 MG PO TABS: 25.0000 mg | ORAL_TABLET | Freq: Every evening | ORAL | Status: DC | PRN

## 2021-03-02 MED ORDER — ALBUTEROL SULFATE HFA 108 (90 BASE) MCG/ACT IN AERS: 2.0000 | INHALATION_SPRAY | RESPIRATORY_TRACT | Status: DC | PRN

## 2021-03-02 MED ORDER — SODIUM CHLORIDE 0.9 % IV SOLN: 500.0000 mg | INTRAVENOUS | Status: DC

## 2021-03-02 MED ORDER — SENNOSIDES-DOCUSATE SODIUM 8.6-50 MG PO TABS: 1.0000 | ORAL_TABLET | Freq: Every evening | ORAL | Status: DC | PRN

## 2021-03-02 MED ORDER — MIRTAZAPINE 15 MG PO TABS
15.0000 mg | ORAL_TABLET | Freq: Every day | ORAL | Status: DC
  Administered 2021-03-02 – 2021-03-04 (×3): 15 mg via ORAL
  Filled 2021-03-02 (×3): qty 1

## 2021-03-02 MED ORDER — ACETAMINOPHEN 325 MG PO TABS
650.0000 mg | ORAL_TABLET | Freq: Once | ORAL | Status: AC
  Administered 2021-03-02: 650 mg via ORAL
  Filled 2021-03-02: qty 2

## 2021-03-02 MED ORDER — BUDESONIDE 0.5 MG/2ML IN SUSP
0.5000 mg | Freq: Two times a day (BID) | RESPIRATORY_TRACT | Status: DC
  Administered 2021-03-02 – 2021-03-04 (×5): 0.5 mg via RESPIRATORY_TRACT
  Filled 2021-03-02 (×5): qty 2

## 2021-03-02 MED ORDER — LEVOTHYROXINE SODIUM 25 MCG PO TABS
125.0000 ug | ORAL_TABLET | Freq: Every morning | ORAL | Status: DC
  Administered 2021-03-03 – 2021-03-05 (×3): 125 ug via ORAL
  Filled 2021-03-02 (×3): qty 1

## 2021-03-02 MED ORDER — SODIUM CHLORIDE 0.9 % IV BOLUS
500.0000 mL | Freq: Once | INTRAVENOUS | Status: AC
  Administered 2021-03-02: 500 mL via INTRAVENOUS

## 2021-03-02 MED ORDER — SODIUM CHLORIDE 0.9% FLUSH
3.0000 mL | Freq: Two times a day (BID) | INTRAVENOUS | Status: DC
  Administered 2021-03-02 – 2021-03-05 (×5): 3 mL via INTRAVENOUS

## 2021-03-02 MED ORDER — BISACODYL 5 MG PO TBEC: 5.0000 mg | DELAYED_RELEASE_TABLET | Freq: Every day | ORAL | Status: DC | PRN

## 2021-03-02 MED ORDER — SODIUM CHLORIDE 0.9 % IV SOLN
500.0000 mg | INTRAVENOUS | Status: DC
  Administered 2021-03-02 – 2021-03-05 (×4): 500 mg via INTRAVENOUS
  Filled 2021-03-02 (×3): qty 500

## 2021-03-02 MED ORDER — BUDESONIDE 0.5 MG/2ML IN SUSP: 0.5000 mg | Freq: Two times a day (BID) | RESPIRATORY_TRACT | Status: DC

## 2021-03-02 MED ORDER — METHYLPREDNISOLONE SODIUM SUCC 125 MG IJ SOLR
125.0000 mg | Freq: Once | INTRAMUSCULAR | Status: AC
  Administered 2021-03-02: 125 mg via INTRAVENOUS
  Filled 2021-03-02: qty 2

## 2021-03-02 MED ORDER — LEVALBUTEROL HCL 0.63 MG/3ML IN NEBU
0.6300 mg | INHALATION_SOLUTION | Freq: Four times a day (QID) | RESPIRATORY_TRACT | Status: DC | PRN
Start: 2021-03-02 — End: 2021-03-05

## 2021-03-02 MED ORDER — MAGNESIUM CITRATE PO SOLN: 1.0000 | Freq: Once | ORAL | Status: DC | PRN

## 2021-03-02 MED ORDER — SODIUM CHLORIDE 0.9% FLUSH: 3.0000 mL | INTRAVENOUS | Status: DC | PRN

## 2021-03-02 MED ORDER — SODIUM CHLORIDE 0.9 % IV SOLN: 250.0000 mL | INTRAVENOUS | Status: DC | PRN

## 2021-03-02 MED ORDER — ACETAZOLAMIDE 250 MG PO TABS
250.0000 mg | ORAL_TABLET | Freq: Two times a day (BID) | ORAL | Status: DC
  Administered 2021-03-03 – 2021-03-05 (×5): 250 mg via ORAL
  Filled 2021-03-02 (×12): qty 1

## 2021-03-02 MED ORDER — SODIUM CHLORIDE 0.9% FLUSH
3.0000 mL | Freq: Two times a day (BID) | INTRAVENOUS | Status: DC
  Administered 2021-03-02 – 2021-03-05 (×6): 3 mL via INTRAVENOUS

## 2021-03-02 MED ORDER — LACTATED RINGERS IV BOLUS (SEPSIS)
500.0000 mL | Freq: Once | INTRAVENOUS | Status: AC
  Administered 2021-03-02: 500 mL via INTRAVENOUS

## 2021-03-02 MED ORDER — METHYLPREDNISOLONE SODIUM SUCC 40 MG IJ SOLR
40.0000 mg | Freq: Two times a day (BID) | INTRAMUSCULAR | Status: DC
  Administered 2021-03-02 – 2021-03-05 (×6): 40 mg via INTRAVENOUS
  Filled 2021-03-02 (×6): qty 1

## 2021-03-02 MED ORDER — HYDROMORPHONE HCL 1 MG/ML IJ SOLN: 0.5000 mg | INTRAMUSCULAR | Status: DC | PRN

## 2021-03-02 MED ORDER — IPRATROPIUM-ALBUTEROL 0.5-2.5 (3) MG/3ML IN SOLN
3.0000 mL | Freq: Once | RESPIRATORY_TRACT | Status: AC
  Administered 2021-03-02: 3 mL via RESPIRATORY_TRACT
  Filled 2021-03-02: qty 3

## 2021-03-02 MED ORDER — MONTELUKAST SODIUM 10 MG PO TABS
10.0000 mg | ORAL_TABLET | Freq: Every day | ORAL | Status: DC
  Administered 2021-03-02 – 2021-03-04 (×3): 10 mg via ORAL
  Filled 2021-03-02 (×3): qty 1

## 2021-03-02 MED ORDER — ONDANSETRON HCL 4 MG PO TABS: 4.0000 mg | ORAL_TABLET | Freq: Four times a day (QID) | ORAL | Status: DC | PRN

## 2021-03-02 NOTE — H&P (Signed)
History and Physical   Patient: Lindsey Butler                            PCP: Neale Burly, MD                    DOB: 08-25-1928            DOA: 03/02/2021 KNL:[id number]             DOS: 03/02/2021, 1:19 PM  Patient coming from:   HOME  I have personally reviewed patient's medical records, in electronic medical records, including:  [place] link, and care everywhere.    Chief Complaint:   Chief Complaint  Patient presents with   Shortness of Breath    History of present illness:    Lindsey Butler is a 87 y.o. female with medical history significant of HTN, systolic heart failure, generalized weakness, akinesis, asthma, GERD, HLD, hypothyroidism, osteoarthritis, chronic back pain...   Presenting with cough congestion, shortness of breath mild chest tightness with cough.  Since Sunday. With history of asthma, multiple breathing treatment at home with no relief. Also complaining of headaches myalgia decreased appetite.    Patient Denies having: Fever, Chills, Chest Pain, Abd pain, N/V/D, headache, dizziness, lightheadedness,  Dysuria, Joint pain, rash, open wounds  ED Course:   Blood pressure (!) 114/56, pulse 84, temperature (!) 101.4 F (38.6 C), temperature source Rectal, resp. rate (!) 21, height _0  (1.676 m), weight 57.6 kg, SpO2 96 %.  Abnormal labs;  CMP within normal limits glucose 100, BNP 200, troponin 8, lactic acid 1.8, WBC 15.4, D-dimer 0.55, Influenza A/B, SARS-CoV-2 negative CTA:  IMPRESSION: 1. No evidence of pulmonary embolism. 2. New areas peripheral pulmonary opacity in the lingula, right upper lobe and right middle lobe are suspicious for pneumonia. 3. Stable compression fractures at T8 and L1.    Assessment / Plan:   Principal Problem:   PNA (pneumonia) Active Problems:   Asthma   Essential hypertension   Major depression, recurrent, chronic (HCC)   Hiatal hernia with GERD without esophagitis   Hypothyroidism    Osteoporosis   Principal Problem:  SIRS due to PNA (pneumonia) -Admitted to telemetry bed, under close observation -Met SIRS criteria with temp of 101.4, RR 21, satting 96% on room air, WBC of 14.4 -Ruling out sepsis, sepsis protocol initiated with IV fluids IV antibiotics -Blood cultures have been obtained, will try to obtain sputum culture -IV antibiotics of Rocephin and azithromycin initiated, will continue -Continue bronchodilator breathing treatment, as needed, incentive spirometer,  Mild respiratory distress-wheezing, shortness of breath -Likely exacerbated by history of asthma, pneumonia -We will continue with breathing treatment, adding steroids IV -Continue antibiotics as above  Active Problems:   Asthma -management as above, resuming home medication of Pulmicort, Singulair,    Essential hypertension -monitoring BP closely, anticipating restarting home medication of losartan,   Major depression, recurrent, chronic (HCC) -continue home medication of SSRI,   Hiatal hernia with GERD without esophagitis -continue Protonix,  Hypothyroidism -continue home dose of Synthroid  Osteoporosis -continue vitamin D supplements  Grade 1 diastolic congestive heart failure No documented of CHF, but patient is on ACE inhibitor, Demadex, Last echocardiogram 03/09/2020, reporting ejection fraction 65-70%, global grade 1 diastolic congestive heart failure -moderate valvular calcifications. -But holding Demadex for today, resuming in a.m.   Cultures:  -Blood cultures x2 -We will try to obtain sputum cultures Antimicrobial: -IV azithromycin/Rocephin  Consults called: None -------------------------------------------------------------------------------------------------------------------------------------------- DVT prophylaxis: SCD/Compression stockings and Heparin SQ Code Status:   Code Status: Full Code   Admission status: Patient will be admitted as Inpatient, with a greater than 2  midnight length of stay. Level of care: Telemetry   Family Communication:  none at bedside  (The above findings and plan of care has been discussed with patient in detail, the patient expressed understanding and agreement of above plan)  --------------------------------------------------------------------------------------------------------------------------------------------------  Disposition Plan: >3 days Status is: Inpatient  Remains inpatient appropriate because:Inpatient level of care appropriate due to severity of illness  Dispo: The patient is from: Home              Anticipated d/c is to: Home with home health and approximately 3 days              Patient currently is not medically stable to d/c.  Requiring IV antibiotic treatment, supplemental oxygen   Difficult to place patient No            Review of Systems: As per HPI, otherwise 10 point review of systems were negative.   ----------------------------------------------------------------------------------------------------------------------  No Known Allergies  Home MEDs:  Prior to Admission medications   Medication Sig Start Date End Date Taking? Authorizing Provider  acetaminophen (TYLENOL) 650 MG CR tablet Take 650 mg by mouth every 6 (six) hours as needed for pain.   Yes [provider]  acetaZOLAMIDE (DIAMOX) 250 MG tablet Take 1 tablet (250 mg total) by mouth 2 (two) times daily. 11/01/19  Yes Gerlene Fee, NP  albuterol (VENTOLIN HFA) 108 (90 Base) MCG/ACT inhaler Inhale 2 puffs into the lungs every 4 (four) hours as needed for wheezing or shortness of breath. 11/01/19  Yes Gerlene Fee, NP  budesonide (PULMICORT) 0.5 MG/2ML nebulizer solution Take 2 mLs (0.5 mg total) by nebulization 2 (two) times daily. 11/01/19  Yes Gerlene Fee, NP  cholecalciferol (VITAMIN D) 25 MCG (1000 UNIT) tablet Take 1,000 Units by mouth daily. 11/07/20  Yes [provider]  levothyroxine (SYNTHROID) 125  MCG tablet Take 1 tablet (125 mcg total) by mouth every morning. Patient taking differently: Take 100 mcg by mouth every morning. 11/01/19  Yes Gerlene Fee, NP  losartan (COZAAR) 50 MG tablet Take 1 tablet (50 mg total) by mouth daily. 11/01/19  Yes Gerlene Fee, NP  mirtazapine (REMERON) 15 MG tablet Take 15 mg by mouth at bedtime. 02/24/21  Yes [provider]  montelukast (SINGULAIR) 10 MG tablet Take 1 tablet (10 mg total) by mouth at bedtime. 11/01/19  Yes Gerlene Fee, NP  pantoprazole (PROTONIX) 40 MG tablet Take 40 mg by mouth daily. 02/24/21  Yes [provider]  predniSONE (DELTASONE) 10 MG tablet Take 1 tablet (10 mg total) by mouth 3 (three) times daily. Patient taking differently: Take 5 mg by mouth daily. 11/13/19  Yes Carole Civil, MD  sertraline (ZOLOFT) 25 MG tablet Take 1 tablet (25 mg total) by mouth daily. 11/01/19  Yes Gerlene Fee, NP  vitamin B-12 (CYANOCOBALAMIN) 1000 MCG tablet Take 1,000 mcg by mouth daily. 02/24/21  Yes [provider]  docusate sodium (COLACE) 100 MG capsule Take 1 capsule (100 mg total) by mouth 2 (two) times daily. Patient not taking: No sig reported 10/07/19   Manuella Ghazi, Pratik D, DO  esomeprazole (NEXIUM) 40 MG capsule Take 1 capsule (40 mg total) by mouth daily. Patient not taking: No sig reported 11/01/19   Gerlene Fee, NP  feeding supplement, ENSURE ENLIVE, (ENSURE ENLIVE) LIQD Take 237 mLs by mouth 2 (two) times daily between meals. 10/07/19   Manuella Ghazi, Pratik D, DO  furosemide (LASIX) 40 MG tablet Take 1 tablet (40 mg total) by mouth daily. Patient not taking: No sig reported 11/01/19   Gerlene Fee, NP  gabapentin (NEURONTIN) 100 MG capsule Take 1 capsule (100 mg total) by mouth 3 (three) times daily. Patient not taking: No sig reported 11/13/19   Carole Civil, MD  ipratropium-albuterol (DUONEB) 0.5-2.5 (3) MG/3ML SOLN Take 3 mLs by nebulization every 6 (six) hours as needed (SOB). Patient not taking:  No sig reported 11/01/19   Gerlene Fee, NP  Multiple Vitamin (MULTIVITAMIN WITH MINERALS) TABS tablet Take 1 tablet by mouth daily. Patient not taking: No sig reported 10/08/19   Heath Lark D, DO  NON FORMULARY Diet: _____ Regular,  __x____ NAS,  _______Consistent Carbohydrate,  _______NPO  _____Other    [provider]  nutrition supplement, JUVEN, (JUVEN) PACK Take 1 packet by mouth 2 (two) times daily between meals. Patient not taking: Reported on 03/02/2021 10/07/19   Heath Lark D, DO  potassium chloride SA (KLOR-CON) 20 MEQ tablet Take 1 tablet (20 mEq total) by mouth daily. Patient not taking: No sig reported 11/01/19 03/02/21  Gerlene Fee, NP    PRN MEDs: sodium chloride, acetaminophen **OR** acetaminophen, albuterol, bisacodyl, hydrALAZINE, HYDROmorphone (DILAUDID) injection, ipratropium, levalbuterol, magnesium citrate, ondansetron **OR** ondansetron (ZOFRAN) IV, oxyCODONE, senna-docusate, sodium chloride flush, traZODone  Past Medical History:  Diagnosis Date   Actinic keratosis    Asthma    GERD (gastroesophageal reflux disease)    Hyperlipidemia    Hypothyroidism    Osteoarthritis     Past Surgical History:  Procedure Laterality Date   CATARACT EXTRACTION     left and right   HIP ARTHROPLASTY Left 10/04/2019   Procedure: ARTHROPLASTY BIPOLAR HIP (HEMIARTHROPLASTY);  Surgeon: Carole Civil, MD;  Location: [facility];  Service: Orthopedics;  Laterality: Left;   THYROIDECTOMY     TONSILLECTOMY     TOTAL KNEE ARTHROPLASTY     right and left knee   VAGINAL HYSTERECTOMY       reports that she has never smoked. She has never used smokeless tobacco. She reports that she does not drink alcohol and does not use drugs.   Family History  Problem Relation Age of Onset   Cancer Son    Liver cancer Brother     Physical Exam:   Vitals:   03/02/21 1030 03/02/21 1100 03/02/21 1200 03/02/21 1230  BP: 133/70 133/66 126/60 (!) 114/56  Pulse: 90 94 87 84   Resp: (!) 22 (!) 24 (!) 23 (!) 21  Temp:      TempSrc:      SpO2: 96% 97% 98% 96%  Weight:      Height:       Constitutional: NAD, calm, comfortable Eyes: PERRL, lids and conjunctivae normal ENMT: Mucous membranes are moist. Posterior pharynx clear of any exudate or lesions.Normal dentition.  Neck: normal, supple, no masses, no thyromegaly Respiratory: clear to auscultation bilaterally, diffuse wheezing, no crackles. Normal respiratory effort. No accessory muscle use.  Cardiovascular: Regular rate and rhythm, no murmurs / rubs / gallops. No extremity edema. 2+ pedal pulses. No carotid bruits.  Abdomen: no tenderness, no masses palpated. No hepatosplenomegaly. Bowel sounds positive.  Musculoskeletal: no clubbing / cyanosis. No joint deformity upper and lower extremities. Good ROM, no contractures. Normal muscle tone.  Neurologic: CN II-XII  grossly intact. Sensation intact, DTR normal. Strength 5/5 in all 4.  Psychiatric: Normal judgment and insight. Alert and oriented x 3. Normal mood.  Skin: no rashes, lesions, ulcers. No induration Decubitus/ulcers:  Wounds: per nursing documentation      Labs on admission:    I have personally reviewed following labs and imaging studies  CBC: Recent Labs  Lab 03/02/21 1015  WBC 15.4*  NEUTROABS 13.0*  HGB 13.1  HCT 41.3  MCV 101.2*  PLT 657   Basic Metabolic Panel: Recent Labs  Lab 03/02/21 1015  NA 135  K 3.9  CL 105  CO2 22  GLUCOSE 100*  BUN 22  CREATININE 0.81  CALCIUM 10.1   GFR: Estimated Creatinine Clearance: 42 mL/min (by C-G formula based on SCr of 0.81 mg/dL). Liver Function Tests: Recent Labs  Lab 03/02/21 1015  AST 14*  ALT 13  ALKPHOS 72  BILITOT 0.4  PROT 7.1  ALBUMIN 3.4*   No results for input(s): LIPASE, AMYLASE in the last 168 hours. No results for input(s): AMMONIA in the last 168 hours. Coagulation Profile: Recent Labs  Lab 03/02/21 1019  INR 1.0   Cardiac Enzymes: No results for  input(s): CKTOTAL, CKMB, CKMBINDEX, TROPONINI in the last 168 hours. BNP (last 3 results) No results for input(s): PROBNP in the last 8760 hours. HbA1C: No results for input(s): HGBA1C in the last 72 hours. CBG: No results for input(s): GLUCAP in the last 168 hours. Lipid Profile: No results for input(s): CHOL, HDL, LDLCALC, TRIG, CHOLHDL, LDLDIRECT in the last 72 hours. Thyroid Function Tests: No results for input(s): TSH, T4TOTAL, FREET4, T3FREE, THYROIDAB in the last 72 hours. Anemia Panel: No results for input(s): VITAMINB12, FOLATE, FERRITIN, TIBC, IRON, RETICCTPCT in the last 72 hours. Urine analysis:    Component Value Date/Time   COLORURINE YELLOW 10/04/2019 0130   APPEARANCEUR CLEAR 10/04/2019 0130   LABSPEC 1.010 10/04/2019 0130   PHURINE 7.0 10/04/2019 0130   GLUCOSEU NEGATIVE 10/04/2019 0130   HGBUR NEGATIVE 10/04/2019 0130   BILIRUBINUR NEGATIVE 10/04/2019 0130   KETONESUR NEGATIVE 10/04/2019 0130   PROTEINUR NEGATIVE 10/04/2019 0130   NITRITE NEGATIVE 10/04/2019 0130   LEUKOCYTESUR NEGATIVE 10/04/2019 0130     Radiologic Exams on Admission:   CT Angio Chest PE W/Cm &/Or Wo Cm  Result Date: 03/02/2021 CLINICAL DATA:  Cough, congestion, leukocytosis and elevated D-dimer. EXAM: CT ANGIOGRAPHY CHEST WITH CONTRAST TECHNIQUE: Multidetector CT imaging of the chest was performed using the standard protocol during bolus administration of intravenous contrast. Multiplanar CT image reconstructions and MIPs were obtained to evaluate the vascular anatomy. CONTRAST:  180m OMNIPAQUE IOHEXOL 350 MG/ML SOLN COMPARISON:  Prior CTA of the chest 03/20/2020 [facility]: The pulmonary arteries are well opacified. There is no evidence of pulmonary embolism. Central pulmonary arteries are normal in caliber. Stable atherosclerosis of the thoracic aorta without evidence of aneurysm or dissection. The heart size is stable and within normal limits. No pericardial  fluid. Stable calcified coronary artery plaque and heavily calcified aortic valve, mitral valve and mitral valve annulus. Mediastinum/Nodes: Stable large hiatal hernia with the majority the stomach in the chest. No enlarged lymph nodes identified. Lungs/Pleura: Some of the areas of pneumonia seen previously have resolved. New focal peripheral opacity in the posterolateral lingula in area roughly 2 cm diameter, nodular densities following bronchovascular distribution in the posterior right upper lobe and consolidation and atelectasis in the inferior right middle lobe. Findings likely represent new areas of infection. No pulmonary  edema, pneumothorax or pleural fluid identified. Upper Abdomen: No acute abnormality. Musculoskeletal: Stable osteopenia and compression deformities at the T8 and L1 levels. Review of the MIP images confirms the above findings. IMPRESSION: 1. No evidence of pulmonary embolism. 2. New areas peripheral pulmonary opacity in the lingula, right upper lobe and right middle lobe are suspicious for pneumonia. 3. Stable compression fractures at T8 and L1. Electronically Signed   By: Aletta Edouard M.D.   On: 03/02/2021 11:50   DG Chest Port 1 View  Result Date: 03/02/2021 CLINICAL DATA:  Cough and congestion. EXAM: PORTABLE CHEST 1 VIEW COMPARISON:  Chest x-ray 01/21/2021 FINDINGS: The cardiac silhouette, mediastinal and hilar contours are stable. There is moderate tortuosity and mild calcification of the thoracic aorta. The lungs are clear of an acute process. No pleural effusions or pulmonary lesions. Stable large hiatal hernia. IMPRESSION: No acute cardiopulmonary findings. Electronically Signed   By: Marijo Sanes M.D.   On: 03/02/2021 10:15    EKG:   Independently reviewed.  Orders placed or performed during the hospital encounter of 03/02/21   ED EKG   ED EKG   EKG 12-Lead    ---------------------------------------------------------------------------------------------------------------------------------------  ----------------------------------------------------------------------------------------------------------------------------------------------------  Time spent: > than  55  Min.   SIGNED: Deatra James, MD, FHM. [facility],  Pager (Please use [email or web address] to page to text)  If 7PM-7AM, please contact night-coverage [email or web address],  03/02/2021, 1:19 PM

## 2021-03-02 NOTE — Sepsis Progress Note (Signed)
Elink tracking the Code Sepsis. 

## 2021-03-02 NOTE — ED Triage Notes (Signed)
Pt congested per son, he has been giving her breathing treatments for the congestion but feels she has an infection in her lungs, alert and oriented.  Pt reports headache.

## 2021-03-02 NOTE — ED Provider Notes (Signed)
[facility] EMERGENCY DEPARTMENT Provider Note   CSN: [id number] Arrival date & time: 03/02/21  0909     History Chief Complaint  Patient presents with   Shortness of Breath    Lindsey Butler is a 87 y.o. female with past medical history significant for hypertension, systolic heart failure, who presents for evaluation of feeling unwell.  Weakness at home. Has had some cough, congestion, shortness of breath.  Some mild chest tightness.  Son additionally saw patient on Sunday.  Saw her again yesterday evening and he states he had to give her 4 breathing treatments.  Patient with headache, myalgias, decreased appetite.  No known COVID exposures.  She is up-to-date on her immunizations.  No one else is sick around her.  Congestion and rhinorrhea clear in color.  She denies any diuretic use.  No neck stiffness, neck rigidity.  Cough is nonproductive.  No abdominal pain, dysuria, hematuria, diarrhea.  No lower extremity swelling.  No prior history of PE or DVT.  Denies additional aggravating or alleviating factors.  History obtained from patient, son in room and past medical records.  No interpreter used.  HPI     Past Medical History:  Diagnosis Date   Actinic keratosis    Asthma    GERD (gastroesophageal reflux disease)    Hyperlipidemia    Hypothyroidism    Osteoarthritis     Patient Active Problem List   Diagnosis Date Noted   PNA (pneumonia) 03/02/2021   Chronic bilateral low back pain without sciatica 10/30/2019   Status post total hip replacement, left 10/19/2019   Post-operative hypothyroidism 10/13/2019   Essential hypertension 10/13/2019   Major depression, recurrent, chronic ([place]) 10/13/2019   Hiatal hernia with GERD without esophagitis 10/13/2019   Increased intraocular pressure, bilateral 10/13/2019   Closed fracture of left hip ([place]) 10/04/2019   Fracture of femoral neck, left ([place]) 10/03/2019   Complete tear of right rotator cuff 06/12/2018   Major depressive  disorder, recurrent episode, moderate ([place]) 05/17/2018   Lumbar spondylosis 05/01/2018   Spinal stenosis of lumbar region without neurogenic claudication 09/27/2016   Basal cell carcinoma of skin of other parts of face 08/29/2016   Contusion of hip 99991111   Acute systolic heart failure ([place]) 12/07/2015   Headache 09/06/2015   Onychomycosis due to dermatophyte 04/14/2015   Edema 03/29/2015   Neuralgia, neuritis or radiculitis 03/27/2014   Osteoporosis 10/28/2013   Hypothyroidism 07/17/2013   Dysuria 10/01/2012   Asthma 09/14/2012   Chronic cough 05/23/2012   Diverticulosis of colon 05/05/2011    Past Surgical History:  Procedure Laterality Date   CATARACT EXTRACTION     left and right   HIP ARTHROPLASTY Left 10/04/2019   Procedure: ARTHROPLASTY BIPOLAR HIP (HEMIARTHROPLASTY);  Surgeon: Carole Civil, MD;  Location: [facility];  Service: Orthopedics;  Laterality: Left;   THYROIDECTOMY     TONSILLECTOMY     TOTAL KNEE ARTHROPLASTY     right and left knee   VAGINAL HYSTERECTOMY       OB History   No obstetric history on file.     Family History  Problem Relation Age of Onset   Cancer Son    Liver cancer Brother     Social History   Tobacco Use   Smoking status: Never   Smokeless tobacco: Never  Substance Use Topics   Alcohol use: No   Drug use: No    Home Medications Prior to Admission medications   Medication Sig Start Date End Date  Taking? Authorizing Provider  acetaminophen (TYLENOL) 650 MG CR tablet Take 650 mg by mouth every 6 (six) hours as needed for pain.   Yes [provider]  acetaZOLAMIDE (DIAMOX) 250 MG tablet Take 1 tablet (250 mg total) by mouth 2 (two) times daily. 11/01/19  Yes Gerlene Fee, NP  albuterol (VENTOLIN HFA) 108 (90 Base) MCG/ACT inhaler Inhale 2 puffs into the lungs every 4 (four) hours as needed for wheezing or shortness of breath. 11/01/19  Yes Gerlene Fee, NP  budesonide (PULMICORT) 0.5 MG/2ML nebulizer  solution Take 2 mLs (0.5 mg total) by nebulization 2 (two) times daily. 11/01/19  Yes Gerlene Fee, NP  cholecalciferol (VITAMIN D) 25 MCG (1000 UNIT) tablet Take 1,000 Units by mouth daily. 11/07/20  Yes [provider]  levothyroxine (SYNTHROID) 125 MCG tablet Take 1 tablet (125 mcg total) by mouth every morning. Patient taking differently: Take 100 mcg by mouth every morning. 11/01/19  Yes Gerlene Fee, NP  losartan (COZAAR) 50 MG tablet Take 1 tablet (50 mg total) by mouth daily. 11/01/19  Yes Gerlene Fee, NP  mirtazapine (REMERON) 15 MG tablet Take 15 mg by mouth at bedtime. 02/24/21  Yes [provider]  montelukast (SINGULAIR) 10 MG tablet Take 1 tablet (10 mg total) by mouth at bedtime. 11/01/19  Yes Gerlene Fee, NP  pantoprazole (PROTONIX) 40 MG tablet Take 40 mg by mouth daily. 02/24/21  Yes [provider]  predniSONE (DELTASONE) 10 MG tablet Take 1 tablet (10 mg total) by mouth 3 (three) times daily. Patient taking differently: Take 5 mg by mouth daily. 11/13/19  Yes Carole Civil, MD  sertraline (ZOLOFT) 25 MG tablet Take 1 tablet (25 mg total) by mouth daily. 11/01/19  Yes Gerlene Fee, NP  vitamin B-12 (CYANOCOBALAMIN) 1000 MCG tablet Take 1,000 mcg by mouth daily. 02/24/21  Yes [provider]  docusate sodium (COLACE) 100 MG capsule Take 1 capsule (100 mg total) by mouth 2 (two) times daily. Patient not taking: No sig reported 10/07/19   Manuella Ghazi, Pratik D, DO  esomeprazole (NEXIUM) 40 MG capsule Take 1 capsule (40 mg total) by mouth daily. Patient not taking: No sig reported 11/01/19   Gerlene Fee, NP  feeding supplement, ENSURE ENLIVE, (ENSURE ENLIVE) LIQD Take 237 mLs by mouth 2 (two) times daily between meals. 10/07/19   Manuella Ghazi, Pratik D, DO  furosemide (LASIX) 40 MG tablet Take 1 tablet (40 mg total) by mouth daily. Patient not taking: No sig reported 11/01/19   Gerlene Fee, NP  gabapentin (NEURONTIN) 100 MG capsule Take 1  capsule (100 mg total) by mouth 3 (three) times daily. Patient not taking: No sig reported 11/13/19   Carole Civil, MD  ipratropium-albuterol (DUONEB) 0.5-2.5 (3) MG/3ML SOLN Take 3 mLs by nebulization every 6 (six) hours as needed (SOB). Patient not taking: No sig reported 11/01/19   Gerlene Fee, NP  Multiple Vitamin (MULTIVITAMIN WITH MINERALS) TABS tablet Take 1 tablet by mouth daily. Patient not taking: No sig reported 10/08/19   Heath Lark D, DO  NON FORMULARY Diet: _____ Regular,  __x____ NAS,  _______Consistent Carbohydrate,  _______NPO  _____Other    [provider]  nutrition supplement, JUVEN, (JUVEN) PACK Take 1 packet by mouth 2 (two) times daily between meals. Patient not taking: Reported on 03/02/2021 10/07/19   Heath Lark D, DO  potassium chloride SA (KLOR-CON) 20 MEQ tablet Take 1 tablet (20 mEq total) by mouth daily.  Patient not taking: No sig reported 11/01/19 03/02/21  Gerlene Fee, NP    Allergies    Patient has no known allergies.  Review of Systems   Review of Systems  Constitutional:  Positive for activity change, appetite change, fatigue and fever.  HENT:  Positive for congestion and rhinorrhea. Negative for sore throat and voice change.   Respiratory:  Positive for cough, chest tightness, shortness of breath and wheezing.   Cardiovascular: Negative.   Gastrointestinal: Negative.   Genitourinary: Negative.   Musculoskeletal:  Positive for myalgias.  Skin: Negative.   Neurological:  Positive for weakness (Generalized) and headaches.  All other systems reviewed and are negative.  Physical Exam Updated Vital Signs BP (!) 114/56   Pulse 84   Temp (!) 101.4 F (38.6 C) (Rectal)   Resp (!) 21   Ht '5\' 6"'$  (1.676 m)   Wt 57.6 kg   SpO2 96%   BMI 20.50 kg/m   Physical Exam Vitals and nursing note reviewed.  Constitutional:      General: She is not in acute distress.    Appearance: She is well-developed. She is ill-appearing. She is  not toxic-appearing.  HENT:     Head: Normocephalic and atraumatic.     Mouth/Throat:     Mouth: Mucous membranes are moist.  Eyes:     Pupils: Pupils are equal, round, and reactive to light.  Cardiovascular:     Rate and Rhythm: Tachycardia present.     Pulses:          Dorsalis pedis pulses are 1+ on the right side and 1+ on the left side.     Heart sounds: Normal heart sounds.  Pulmonary:     Effort: Tachypnea present. No accessory muscle usage or respiratory distress.     Breath sounds: No stridor. Wheezing and rhonchi present.     Comments: Course x-ray expiratory wheeze.  Speaks in full sentences.  Mild rhonchi bilaterally Chest:     Comments: Equal rise and fall to chest wall Abdominal:     General: Bowel sounds are normal. There is no distension.     Palpations: Abdomen is soft.     Comments: Soft, nontender without rebound or guarding  Musculoskeletal:        General: Normal range of motion.     Cervical back: Normal range of motion.     Right lower leg: No tenderness. No edema.     Left lower leg: No tenderness. No edema.     Comments: No bony tenderness.  Moves all 4 extremities without difficulty  Skin:    General: Skin is warm and dry.     Capillary Refill: Capillary refill takes less than 2 seconds.     Comments: Chronic venous stasis skin changes to bilateral lower extremities, no pitting edema.  Neurological:     General: No focal deficit present.     Mental Status: She is alert.  Psychiatric:        Mood and Affect: Mood normal.    ED Results / Procedures / Treatments   Labs (all labs ordered are listed, but only abnormal results are displayed) Labs Reviewed  CBC WITH DIFFERENTIAL/PLATELET - Abnormal; Notable for the following components:      Result Value   WBC 15.4 (*)    MCV 101.2 (*)    Neutro Abs 13.0 (*)    Monocytes Absolute 1.5 (*)    All other components within normal limits  COMPREHENSIVE METABOLIC PANEL - Abnormal;  Notable for the  following components:   Glucose, Bld 100 (*)    Albumin 3.4 (*)    AST 14 (*)    All other components within normal limits  BRAIN NATRIURETIC PEPTIDE - Abnormal; Notable for the following components:   B Natriuretic Peptide 200.0 (*)    All other components within normal limits  D-DIMER, QUANTITATIVE - Abnormal; Notable for the following components:   D-Dimer, Quant 0.55 (*)    All other components within normal limits  CULTURE, BLOOD (ROUTINE X 2)  CULTURE, BLOOD (ROUTINE X 2)  RESP PANEL BY RT-PCR (FLU A&B, COVID) ARPGX2  EXPECTORATED SPUTUM ASSESSMENT W GRAM STAIN, RFLX TO RESP C  LACTIC ACID, PLASMA  PROTIME-INR  APTT  URINALYSIS, ROUTINE W REFLEX MICROSCOPIC  BRAIN NATRIURETIC PEPTIDE  CBC  CREATININE, SERUM  PROCALCITONIN  TROPONIN I (HIGH SENSITIVITY)  TROPONIN I (HIGH SENSITIVITY)    EKG None  Radiology CT Angio Chest PE W/Cm &/Or Wo Cm  Result Date: 03/02/2021 CLINICAL DATA:  Cough, congestion, leukocytosis and elevated D-dimer. EXAM: CT ANGIOGRAPHY CHEST WITH CONTRAST TECHNIQUE: Multidetector CT imaging of the chest was performed using the standard protocol during bolus administration of intravenous contrast. Multiplanar CT image reconstructions and MIPs were obtained to evaluate the vascular anatomy. CONTRAST:  137m OMNIPAQUE IOHEXOL 350 MG/ML SOLN COMPARISON:  [facility] [facility]: The pulmonary arteries are well opacified. There is no evidence of pulmonary embolism. Central pulmonary arteries are normal in caliber. Stable atherosclerosis of the thoracic aorta without evidence of aneurysm or dissection. The heart size is stable and within normal limits. No pericardial fluid. Stable calcified coronary artery plaque and heavily calcified aortic valve, mitral valve and mitral valve annulus. Mediastinum/Nodes: Stable large hiatal hernia with the majority the stomach in the chest. No enlarged lymph nodes identified.  Lungs/Pleura: Some of the areas of pneumonia seen previously have resolved. New focal peripheral opacity in the posterolateral lingula in area roughly 2 cm diameter, nodular densities following bronchovascular distribution in the posterior right upper lobe and consolidation and atelectasis in the inferior right middle lobe. Findings likely represent new areas of infection. No pulmonary edema, pneumothorax or pleural fluid identified. Upper Abdomen: No acute abnormality. Musculoskeletal: Stable osteopenia and compression deformities at the T8 and L1 levels. Review of the MIP images confirms the above findings. IMPRESSION: 1. No evidence of pulmonary embolism. 2. New areas peripheral pulmonary opacity in the lingula, right upper lobe and right middle lobe are suspicious for pneumonia. 3. Stable compression fractures at T8 and L1. Electronically Signed   By: GAletta EdouardM.D.   On: 03/02/2021 11:50   DG Chest Port 1 View  Result Date: 03/02/2021 CLINICAL DATA:  Cough and congestion. EXAM: PORTABLE CHEST 1 VIEW COMPARISON:  Chest x-ray 01/21/2021 FINDINGS: The cardiac silhouette, mediastinal and hilar contours are stable. There is moderate tortuosity and mild calcification of the thoracic aorta. The lungs are clear of an acute process. No pleural effusions or pulmonary lesions. Stable large hiatal hernia. IMPRESSION: No acute cardiopulmonary findings. Electronically Signed   By: PMarijo SanesM.D.   On: 03/02/2021 10:15    Procedures .Critical Care  Date/Time: 03/02/2021 12:45 PM Performed by: HNettie Elm PA-C Authorized by: HNettie Elm PA-C   Critical care provider statement:    Critical care time (minutes):  35   Critical care was necessary to treat or prevent imminent or life-threatening deterioration of the following conditions:  Sepsis   Critical  care was time spent personally by me on the following activities:  Discussions with consultants, evaluation of patient's response to  treatment, examination of patient, ordering and performing treatments and interventions, ordering and review of laboratory studies, ordering and review of radiographic studies, pulse oximetry, re-evaluation of patient's condition, obtaining history from patient or surrogate and review of old charts   Medications Ordered in ED Medications  albuterol (VENTOLIN HFA) 108 (90 Base) MCG/ACT inhaler 2 puff (has no administration in time range)  sodium chloride flush (NS) 0.9 % injection 3 mL (has no administration in time range)  0.9 %  sodium chloride infusion (has no administration in time range)  sodium chloride flush (NS) 0.9 % injection 3 mL (has no administration in time range)  sodium chloride flush (NS) 0.9 % injection 3 mL (has no administration in time range)  0.9 %  sodium chloride infusion (has no administration in time range)  acetaminophen (TYLENOL) tablet 650 mg (has no administration in time range)    Or  acetaminophen (TYLENOL) suppository 650 mg (has no administration in time range)  oxyCODONE (Oxy IR/ROXICODONE) immediate release tablet 5 mg (has no administration in time range)  HYDROmorphone (DILAUDID) injection 0.5-1 mg (has no administration in time range)  traZODone (DESYREL) tablet 25 mg (has no administration in time range)  senna-docusate (Senokot-S) tablet 1 tablet (has no administration in time range)  bisacodyl (DULCOLAX) EC tablet 5 mg (has no administration in time range)  magnesium citrate solution 1 Bottle (has no administration in time range)  ondansetron (ZOFRAN) tablet 4 mg (has no administration in time range)    Or  ondansetron (ZOFRAN) injection 4 mg (has no administration in time range)  ipratropium (ATROVENT) nebulizer solution 0.5 mg (has no administration in time range)  levalbuterol (XOPENEX) nebulizer solution 0.63 mg (has no administration in time range)  hydrALAZINE (APRESOLINE) injection 10 mg (has no administration in time range)  heparin injection  5,000 Units (has no administration in time range)  cefTRIAXone (ROCEPHIN) 2 g in sodium chloride 0.9 % 100 mL IVPB (has no administration in time range)  azithromycin (ZITHROMAX) 500 mg in sodium chloride 0.9 % 250 mL IVPB (has no administration in time range)  lactated ringers bolus 1,000 mL (1,000 mLs Intravenous New Bag/Given 03/02/21 1339)    And  lactated ringers bolus 500 mL (has no administration in time range)    And  lactated ringers bolus 250 mL (has no administration in time range)  nutrition supplement (JUVEN) (JUVEN) powder packet 1 packet (has no administration in time range)  acetaZOLAMIDE (DIAMOX) tablet 250 mg (has no administration in time range)  mirtazapine (REMERON) tablet 15 mg (has no administration in time range)  sertraline (ZOLOFT) tablet 25 mg (has no administration in time range)  levothyroxine (SYNTHROID) tablet 125 mcg (has no administration in time range)  pantoprazole (PROTONIX) EC tablet 40 mg (has no administration in time range)  budesonide (PULMICORT) nebulizer solution 0.5 mg (has no administration in time range)  montelukast (SINGULAIR) tablet 10 mg (has no administration in time range)  cefTRIAXone (ROCEPHIN) 1 g in sodium chloride 0.9 % 100 mL IVPB (has no administration in time range)  methylPREDNISolone sodium succinate (SOLU-MEDROL) 40 mg/mL injection 40 mg (has no administration in time range)  methylPREDNISolone sodium succinate (SOLU-MEDROL) 125 mg/2 mL injection 125 mg (125 mg Intravenous Given 03/02/21 1055)  ipratropium-albuterol (DUONEB) 0.5-2.5 (3) MG/3ML nebulizer solution 3 mL (3 mLs Nebulization Given 03/02/21 1102)  acetaminophen (TYLENOL) tablet 650 mg (650 mg  Oral Given 03/02/21 1055)  cefTRIAXone (ROCEPHIN) 1 g in sodium chloride 0.9 % 100 mL IVPB (0 g Intravenous Stopped 03/02/21 1130)  sodium chloride 0.9 % bolus 500 mL (500 mLs Intravenous New Bag/Given 03/02/21 1054)  iohexol (OMNIPAQUE) 350 MG/ML injection 100 mL (100 mLs Intravenous  Contrast Given 03/02/21 1122)   ED Course  I have reviewed the triage vital signs and the nursing notes.  Pertinent labs & imaging results that were available during my care of the patient were reviewed by me and considered in my medical decision making (see chart for details).  Here for evaluation of upper respiratory complaints.  On arrival patient is febrile, tachycardic with mild tachypnea.  Apparently getting multiple breathing treatments at home without relief of wheeze.  She has associated headache however has nonfocal neuro exam without deficits.  She has no neck stiffness or neck rigidity.  She has no meningismus.  She is at her baseline mentation per family in room.  No urinary complaints.  Abdomen soft, nontender.  Her heart is clear however does have diffuse expiratory wheeze, coarse lung sounds bilaterally however speaks in full sentences.  Patient started on IV fluids, Rocephin to cover for upper respiratory infection on initial evaluation. question COVID given headache, myalgias. Hold on large IVF bolus given hs of CHF with normal BP here in ED. Does not appear grossly fluid overloaded on exam. Rectal temp 101.4, given Tylenol. Plan on labs, imaging and reassess.  Labs and imaging personally reviewed and interpreted:  CBC leukocytosis at 15.4 CMP glucose 100 no additional aggravating or alleviating factors Lactic 1.8 BC pending Trop 8 D-dimer 0.55 BNP 200 COVID, flu neg DG chest without acute findings EKG without acute findings  Patient reassessed.  Gets very dyspneic with attempting to move.  Discussed findings of with patient and son in room.  Will admit as patient meets sepsis criteria likely due to community-acquired pneumonia.  Antibiotics given here in ED.  Her wheezing did improve with steroids and DuoNeb.  The patient appears reasonably stabilized for admission considering the current resources, flow, and capabilities available in the ED at this time, and I doubt any  other [facility] requiring further screening and/or treatment in the ED prior to admission.    CONSULT with Dr. Roger Shelter with [facility] who will evaluated patient for admission.    The patient appears reasonably stabilized for admission considering the current resources, flow, and capabilities available in the ED at this time, and I doubt any other [facility] requiring further screening and/or treatment in the ED prior to admission.   Patient seen and evaluated by attending, Dr. Reather Converse who agrees with above treatment, plan and disposition.    MDM Rules/Calculators/A&P                            Final Clinical Impression(s) / ED Diagnoses Final diagnoses:  Sepsis without acute organ dysfunction, due to unspecified organism [facility])  Community acquired pneumonia of right lung, unspecified part of lung  Weakness  Moderate persistent asthma with exacerbation    Rx / DC Orders ED Discharge Orders     None        Aul Mangieri A, PA-C 03/02/21 1344    Elnora Morrison, MD 03/04/21 1731

## 2021-03-02 NOTE — Sepsis Progress Note (Signed)
Notified bedside nurse of need to draw repeat lactic acid. 

## 2021-03-03 DIAGNOSIS — R6510 Systemic inflammatory response syndrome (SIRS) of non-infectious origin without acute organ dysfunction: Secondary | ICD-10-CM | POA: Diagnosis not present

## 2021-03-03 LAB — BASIC METABOLIC PANEL
Anion gap: 5 (ref 5–15)
BUN: 19 mg/dL (ref 8–23)
CO2: 22 mmol/L (ref 22–32)
Calcium: 9.3 mg/dL (ref 8.9–10.3)
Chloride: 110 mmol/L (ref 98–111)
Creatinine, Ser: 0.73 mg/dL (ref 0.44–1.00)
GFR, Estimated: >60 mL/min (ref >60)
Glucose, Bld: 148 mg/dL — ABNORMAL HIGH (ref 70–99)
Potassium: 4.4 mmol/L (ref 3.5–5.1)
Sodium: 137 mmol/L (ref 135–145)

## 2021-03-03 LAB — CBC
HCT: 35.5 % — ABNORMAL LOW (ref 36.0–46.0)
Hemoglobin: 11.1 g/dL — ABNORMAL LOW (ref 12.0–15.0)
MCH: 31.4 pg (ref 26.0–34.0)
MCHC: 31.3 g/dL (ref 30.0–36.0)
MCV: 100.6 fL — ABNORMAL HIGH (ref 80.0–100.0)
Platelets: 228 10*3/uL (ref 150–400)
RBC: 3.53 MIL/uL — ABNORMAL LOW (ref 3.87–5.11)
RDW: 13.5 % (ref 11.5–15.5)
WBC: 11.2 10*3/uL — ABNORMAL HIGH (ref 4.0–10.5)
nRBC: 0 % (ref 0.0–0.2)

## 2021-03-03 LAB — PROTIME-INR
INR: 1 (ref 0.8–1.2)
Prothrombin Time: 13.6 seconds (ref 11.4–15.2)

## 2021-03-03 NOTE — Evaluation (Signed)
Physical Therapy Evaluation Patient Details Name: Lindsey Butler MRN: [id number] DOB: July 18, 1929 Today's Date: 03/03/2021   History of Present Illness  Lindsey Butler is a 87 y.o. female with medical history significant of HTN, systolic heart failure, generalized weakness, akinesis, asthma, GERD, HLD, hypothyroidism, osteoarthritis, chronic back pain...      Presenting with cough congestion, shortness of breath mild chest tightness with cough.  Since Sunday.  With history of asthma, multiple breathing treatment at home with no relief.  Also complaining of headaches myalgia decreased appetite.   Clinical Impression  Patient functioning near baseline for functional mobility and gait demonstrating slightly labored cadence without loss of balance, limited mostly due to fatigue, requires repeated verbal/tactile cueing during transfer to commode in bathroom with fair carryover and tolerated sitting up at bedside with OT present in room after therapy.  Patient will benefit from continued physical therapy in hospital and recommended venue below to increase strength, balance, endurance for safe ADLs and gait.      Follow Up Recommendations Home health PT;Supervision for mobility/OOB;Supervision - Intermittent    Equipment Recommendations  None recommended by PT    Recommendations for Other Services       Precautions / Restrictions Precautions Precautions: Fall Restrictions Weight Bearing Restrictions: No      Mobility  Bed Mobility Overal bed mobility: Needs Assistance Bed Mobility: Supine to Sit     Supine to sit: Supervision     General bed mobility comments: labored movement, increased time    Transfers Overall transfer level: Needs assistance Equipment used: Rolling walker (2 wheeled) Transfers: Sit to/from [facility] Sit to Stand: Min guard Stand pivot transfers: Min assist       General transfer comment: requires repeated verbal/tactile cueing for  safety during transfers  Ambulation/Gait Ambulation/Gait assistance: Min guard Gait Distance (Feet): 75 Feet Assistive device: Rolling walker (2 wheeled) Gait Pattern/deviations: Decreased step length - right;Decreased step length - left;Decreased stride length;Trunk flexed Gait velocity: decreased   General Gait Details: slightly labored slow cadence without loss of balance, limited mostly due to c/o fatigue and SOB, SpO2 at 97% after ambulation  Stairs            Wheelchair Mobility    Modified Rankin (Stroke Patients Only)       Balance Overall balance assessment: Needs assistance Sitting-balance support: Feet supported;No upper extremity supported Sitting balance-Leahy Scale: Good Sitting balance - Comments: seated at EOB   Standing balance support: During functional activity;Bilateral upper extremity supported Standing balance-Leahy Scale: Fair Standing balance comment: using RW                             Pertinent Vitals/Pain Pain Assessment: 0-10 Pain Score: 7  Pain Location: headache Pain Descriptors / Indicators: Headache Pain Intervention(s): Limited activity within patient's tolerance;Monitored during session    Home Living Family/patient expects to be discharged to:: Private residence Living Arrangements: Alone Available Help at Discharge: Family;Available 24 hours/day Type of Home: House Home Access: Stairs to enter Entrance Stairs-Rails: Right Entrance Stairs-Number of Steps: 3 Home Layout: One level Home Equipment: Cane - single point;Walker - 2 wheels;Tub bench;Wheelchair - manual      Prior Function Level of Independence: Needs assistance   Gait / Transfers Assistance Needed: household ambulator using RW  ADL's / Homemaking Assistance Needed: assisted by family 24/7        Hand Dominance        Extremity/Trunk Assessment  Upper Extremity Assessment Upper Extremity Assessment: Defer to OT evaluation    Lower  Extremity Assessment Lower Extremity Assessment: Generalized weakness    Cervical / Trunk Assessment Cervical / Trunk Assessment: Kyphotic  Communication   Communication: No difficulties  Cognition Arousal/Alertness: Awake/alert Behavior During Therapy: WFL for tasks assessed/performed Overall Cognitive Status: Within Functional Limits for tasks assessed                                        General Comments      Exercises     Assessment/Plan    PT Assessment Patient needs continued PT services  PT Problem List Decreased strength;Decreased activity tolerance;Decreased balance;Decreased mobility       PT Treatment Interventions DME instruction;Gait training;Stair training;Functional mobility training;Therapeutic activities;Therapeutic exercise;Patient/family education;Balance training    PT Goals (Current goals can be found in the Care Plan section)  Acute Rehab PT Goals Patient Stated Goal: return home with family to assist PT Goal Formulation: With patient Time For Goal Achievement: 03/07/21 Potential to Achieve Goals: Good    Frequency Min 3X/week   Barriers to discharge        Co-evaluation PT/OT/SLP Co-Evaluation/Treatment: Yes Reason for Co-Treatment: To address functional/ADL transfers PT goals addressed during session: Mobility/safety with mobility;Balance;Proper use of DME         AM-PAC PT "6 Clicks" Mobility  Outcome Measure Help needed turning from your back to your side while in a flat bed without using bedrails?: None Help needed moving from lying on your back to sitting on the side of a flat bed without using bedrails?: A Little Help needed moving to and from a bed to a chair (including a wheelchair)?: A Little Help needed standing up from a chair using your arms (e.g., wheelchair or bedside chair)?: A Little Help needed to walk in hospital room?: A Little Help needed climbing 3-5 steps with a railing? : A Lot 6 Click Score:  18    End of Session   Activity Tolerance: Patient tolerated treatment well;Patient limited by fatigue Patient left: in bed;with call bell/phone within reach;Other (comment) (with OT present in room) Nurse Communication: Mobility status PT Visit Diagnosis: Unsteadiness on feet (R26.81);Other abnormalities of gait and mobility (R26.89);Muscle weakness (generalized) (M62.81)    Time: [id number] PT Time Calculation (min) (ACUTE ONLY): 22 min   Charges:   PT Evaluation $PT Eval Moderate Complexity: 1 Mod PT Treatments $Therapeutic Activity: 23-37 mins        10:17 AM, 03/03/21 Lonell Grandchild, MPT Physical Therapist with [facility] [phone] office [phone] mobile phone

## 2021-03-03 NOTE — Plan of Care (Signed)
  Problem: Acute Rehab OT Goals (only OT should resolve) Goal: Pt. Will Perform Grooming Flowsheets (Taken 03/03/2021 1233) Pt Will Perform Grooming:  with modified independence  with adaptive equipment  standing Goal: Pt. Will Perform Lower Body Dressing Flowsheets (Taken 03/03/2021 1233) Pt Will Perform Lower Body Dressing:  with modified independence  sitting/lateral leans  sit to/from stand Goal: Pt. Will Transfer To Toilet Flowsheets (Taken 03/03/2021 1233) Pt Will Transfer to Toilet:  with modified independence  stand pivot transfer Goal: Pt/Caregiver Will Perform Home Exercise Program Flowsheets (Taken 03/03/2021 1233) Pt/caregiver will Perform Home Exercise Program:  Increased ROM  Both right and left upper extremity  With Supervision  With minimal assist  Phinneas Shakoor OT, MOT

## 2021-03-03 NOTE — Progress Notes (Signed)
Initial Nutrition Assessment  DOCUMENTATION CODES:      INTERVENTION:  Education - High Protein/High Calorie to prevent weight loss and promote lean tissue maintenance   Magic cup TID with meals, each supplement provides 290 kcal and 9 grams of protein   NUTRITION DIAGNOSIS:   Inadequate oral intake related to acute illness (pneumonia) as evidenced by per patient/family report (0-50% meal since admission).   GOAL:  Patient will meet greater than or equal to 90% of their needs   MONITOR:  PO intake, Labs, Supplement acceptance, Weight trends  REASON FOR ASSESSMENT:   Malnutrition Screening Tool    ASSESSMENT: Patient is a 87 yo female with history of Asthma, Hiatal hernia with GERD, HTN, HF and presents with SIRS due to PNA.   Meal intake 50% -not surprising given her age and acute pneumonia. She is taking Remeron which may help her appetite. Patient says, " I don't feel bad I just can't eat".   Able to feed herself but having difficulty today due to tray table. Offered to provide and Ensure or ice cream or bring something different to her but pt refused.  Patient weight (127.1 lb) 57.8 kg trend down- compared to last available 11/01/19- 62.6 kg. Patient reports usual weight 137 lb but denies change in how clothing fits her. Weight difference may be mechanically related.  Medications reviewed and include: Remeron, JUVEN, Protonix, Solumedrol.   Labs: BMP Latest Ref Rng & Units 03/03/2021 03/02/2021 03/02/2021  Glucose 70 - 99 mg/dL 148(H) - 100(H)  BUN 8 - 23 mg/dL 19 - 22  Creatinine 0.44 - 1.00 mg/dL 0.73 0.69 0.81  Sodium 135 - 145 mmol/L 137 - 135  Potassium 3.5 - 5.1 mmol/L 4.4 - 3.9  Chloride 98 - 111 mmol/L 110 - 105  CO2 22 - 32 mmol/L 22 - 22  Calcium 8.9 - 10.3 mg/dL 9.3 - 10.1      NUTRITION - FOCUSED PHYSICAL EXAM:  Nutrition-Focused physical exam completed. Findings are mild upper arm fat depletion, mild thoracic muscle depletion, and no edema.    Diet  Order:   Diet Order             Diet regular Room service appropriate? Yes; Fluid consistency: Thin  Diet effective now                   EDUCATION NEEDS:  Education needs have been addressed (high protein/high calorie)  Skin:  Skin Assessment: Reviewed RN Assessment (MASD to sacrum)  Last BM:  7/26  Height:   Ht Readings from Last 1 Encounters:  03/02/21 '5\' 6"'$  (1.676 m)    Weight:   Wt Readings from Last 1 Encounters:  03/03/21 57.8 kg    Ideal Body Weight:   59 kg  BMI:  Body mass index is 20.57 kg/m.  Estimated Nutritional Needs:   Kcal:  1500-1600  Protein:  70-75 gr  Fluid:  < 2 liters daily   Colman Cater MS,RD,CSG,LDN Contact: Shea Evans

## 2021-03-03 NOTE — TOC Initial Note (Signed)
Transition of Care [facility]) - Initial/Assessment Note    Patient Details  Name: Lindsey Butler MRN: [id number] Date of Birth: [birth year]  Transition of Care [facility]) CM/SW Contact:    Natasha Bence, LCSW Phone Number: 03/03/2021, 2:16 PM  Clinical Narrative:                 Patient is a 87 year old female admitted for SIRS (systemic inflammatory response syndrome). CSW conducted the initial referral. Patient's daughter in law reported that she is the primary caregiver and that patient was ambulatory with min assistance at baseline. CSW observed patient's recommendation for [facility] and inquired about agreeableness to [facility]. Patient's daughter in law reported that she provide's daily care and declined HH. TOC to follow.  Expected Discharge Plan: [place] Barriers to Discharge: Continued Medical Work up   Patient Goals and CMS Choice Patient states their goals for this hospitalization and ongoing recovery are:: Return home CMS Medicare.gov Compare Post Acute Care list provided to:: Patient Choice offered to / list presented to : Patient  Expected Discharge Plan and Services Expected Discharge Plan: [place]                                              Prior Living Arrangements/Services   Lives with:: Self, Adult Children Patient language and need for interpreter reviewed:: No Do you feel safe going back to the place where you live?: Yes      Need for Family Participation in Patient Care: Yes (Comment) Care giver support system in place?: Yes (comment)      Activities of Daily Living Home Assistive Devices/Equipment: Gilford Rile (specify type) ADL Screening (condition at time of admission) Patient's cognitive ability adequate to safely complete daily activities?: Yes Is the patient deaf or have difficulty hearing?: Yes Does the patient have difficulty seeing, even when wearing glasses/contacts?: No Does the patient have difficulty  concentrating, remembering, or making decisions?: No Patient able to express need for assistance with ADLs?: Yes Does the patient have difficulty dressing or bathing?: No Independently performs ADLs?: Yes (appropriate for developmental age) Does the patient have difficulty walking or climbing stairs?: No Weakness of Legs: Both Weakness of Arms/Hands: None  Permission Sought/Granted Permission sought to share information with : Facility Art therapist granted to share information with : Yes, Verbal Permission Granted              Emotional Assessment     Affect (typically observed): Accepting, Adaptable Orientation: : Oriented to Self, Oriented to Place, Oriented to  Time, Oriented to Situation Alcohol / Substance Use: Not Applicable Psych Involvement: No (comment)  Admission diagnosis:  Weakness [R53.1] Moderate persistent asthma with exacerbation [J45.41] PNA (pneumonia) [J18.9] Sepsis ([place]) [A41.9] Community acquired pneumonia of right lung, unspecified part of lung [J18.9] Sepsis without acute organ dysfunction, due to unspecified organism [facility]) [A41.9] Patient Active Problem List   Diagnosis Date Noted   PNA (pneumonia) 03/02/2021   SIRS (systemic inflammatory response syndrome) ([place]) 03/02/2021   Chronic bilateral low back pain without sciatica 10/30/2019   Status post total hip replacement, left 10/19/2019   Post-operative hypothyroidism 10/13/2019   Essential hypertension 10/13/2019   Major depression, recurrent, chronic ([place]) 10/13/2019   Hiatal hernia with GERD without esophagitis 10/13/2019   Increased intraocular pressure, bilateral 10/13/2019   Closed fracture of left  hip ([place]) 10/04/2019   Fracture of femoral neck, left ([place]) 10/03/2019   Complete tear of right rotator cuff 06/12/2018   Major depressive disorder, recurrent episode, moderate ([place]) 05/17/2018   Lumbar spondylosis 05/01/2018   Spinal stenosis of lumbar region without  neurogenic claudication 09/27/2016   Basal cell carcinoma of skin of other parts of face 08/29/2016   Contusion of hip [id number]   Acute systolic heart failure ([place]) 12/07/2015   Headache 09/06/2015   Onychomycosis due to dermatophyte 04/14/2015   Edema 03/29/2015   Neuralgia, neuritis or radiculitis 03/27/2014   Osteoporosis 10/28/2013   Hypothyroidism 07/17/2013   Dysuria 10/01/2012   Asthma 09/14/2012   Chronic cough 05/23/2012   Diverticulosis of colon 05/05/2011   PCP:  Neale Burly, MD Pharmacy:   [place], [place] [address] [place] [address] Phone: [phone] Fax: [phone]  [facility] - [place], [place] - [address] [place] [address] Phone: [phone] Fax: [phone]     Social Determinants of Health (SDOH) Interventions    Readmission Risk Interventions No flowsheet data found.

## 2021-03-03 NOTE — Plan of Care (Signed)
  Problem: Acute Rehab PT Goals(only PT should resolve) Goal: Pt Will Go Supine/Side To Sit Outcome: Progressing Flowsheets (Taken 03/03/2021 1018) Pt will go Supine/Side to Sit: with modified independence Goal: Patient Will Transfer Sit To/From Stand Outcome: Progressing Flowsheets (Taken 03/03/2021 1018) Patient will transfer sit to/from stand: with supervision Goal: Pt Will Transfer Bed To Chair/Chair To Bed Outcome: Progressing Flowsheets (Taken 03/03/2021 1018) Pt will Transfer Bed to Chair/Chair to Bed: with supervision Goal: Pt Will Ambulate Outcome: Progressing Flowsheets (Taken 03/03/2021 1018) Pt will Ambulate:  100 feet  with supervision  with min guard assist  with rolling walker  10:19 AM, 03/03/21 Lonell Grandchild, MPT Physical Therapist with [facility] [phone] office [phone] mobile phone

## 2021-03-03 NOTE — Evaluation (Signed)
Occupational Therapy Evaluation Patient Details Name: Lindsey Butler MRN: [id number] DOB: [birth year] Today's Date: 03/03/2021    History of Present Illness Lindsey Butler is a 87 y.o. female with medical history significant of HTN, systolic heart failure, generalized weakness, akinesis, asthma, GERD, HLD, hypothyroidism, osteoarthritis, chronic back pain...      Presenting with cough congestion, shortness of breath mild chest tightness with cough.  Since Sunday.  With history of asthma, multiple breathing treatment at home with no relief.  Also complaining of headaches myalgia decreased appetite.   Clinical Impression   Pt agreeable to OT/PT co-evaluation. Pt requires min G assist for sit to stand and Min A for ambulatory transfers. Pt required Min A for toilet hygiene due to missing the toilet partially. Pt able to complete grooming with min G assist and donned socks with SPV at EOB. Pt O2 saturation taken when seated on the toilet after ambulation with a level of 97%. Pt will benefit from continued OT in the hospital and recommended venue below to increase strength, balance, ROM, and endurance for safe ADL's.     Follow Up Recommendations  Home health OT;Supervision/Assistance - 24 hour    Equipment Recommendations  None recommended by OT           Precautions / Restrictions Precautions Precautions: Fall Restrictions Weight Bearing Restrictions: No      Mobility Bed Mobility Overal bed mobility: Needs Assistance Bed Mobility: Supine to Sit     Supine to sit: Supervision     General bed mobility comments: labored movement, increased time    Transfers Overall transfer level: Needs assistance Equipment used: Rolling walker (2 wheeled) Transfers: Sit to/from [facility] Sit to Stand: Min guard Stand pivot transfers: Min assist       General transfer comment: requires repeated verbal/tactile cueing for safety during transfers    Balance Overall  balance assessment: Needs assistance Sitting-balance support: Feet supported;No upper extremity supported Sitting balance-Leahy Scale: Good Sitting balance - Comments: seated at EOB   Standing balance support: During functional activity;Bilateral upper extremity supported Standing balance-Leahy Scale: Fair Standing balance comment: using RW                           ADL either performed or assessed with clinical judgement   ADL Overall ADL's : Needs assistance/impaired     Grooming: Min guard;Standing;Wash/dry hands Grooming Details (indicate cue type and reason): standing at sink with RW         Upper Body Dressing : Set up;Sitting Upper Body Dressing Details (indicate cue type and reason): at EOB Lower Body Dressing: Supervision/safety;Sitting/lateral leans Lower Body Dressing Details (indicate cue type and reason): seated at EOB Toilet Transfer: Minimal assistance;Ambulation;RW;Grab bars Toilet Transfer Details (indicate cue type and reason): using RW and grab bars Toileting- Clothing Manipulation and Hygiene: Minimal assistance;Sitting/lateral lean Toileting - Clothing Manipulation Details (indicate cue type and reason): Pt required assist to clean leg after bowel due to some getting on L LE. Pt able to complete peri-care with lateral leans on toilet with s/u assist.             Vision Baseline Vision/History: Wears glasses Wears Glasses: Reading only Patient Visual Report: No change from baseline                  Pertinent Vitals/Pain Pain Assessment: 0-10 Pain Score: 7  Pain Location: headache Pain Descriptors / Indicators: Headache Pain Intervention(s): Limited activity within  patient's tolerance;Monitored during session;Repositioned     Hand Dominance     Extremity/Trunk Assessment Upper Extremity Assessment Upper Extremity Assessment: RUE deficits/detail;LUE deficits/detail RUE Deficits / Details: ~90* shoulder flexion and abduction A/ROM  and P/ROM. Pt reports this it sypical at baseline. 4-/5 elbow MMT. Good sequential finger touches. RUE Sensation: WNL RUE Coordination: decreased gross motor (ROM limitations) LUE Deficits / Details: ~90* shoulder flexion and abduction A/ROM with [facility] shoulder A/A/ROM. 4-/5 elbow MMT. LUE Sensation: WNL LUE Coordination:  (Limited in coordination by strength deficits.)   Lower Extremity Assessment Lower Extremity Assessment: Generalized weakness   Cervical / Trunk Assessment Cervical / Trunk Assessment: Kyphotic   Communication Communication Communication: No difficulties   Cognition Arousal/Alertness: Awake/alert Behavior During Therapy: WFL for tasks assessed/performed Overall Cognitive Status: Within Functional Limits for tasks assessed (Pt not oriented to year. Was oriented to place and situation.)                                                      Home Living Family/patient expects to be discharged to:: Private residence Living Arrangements: Alone Available Help at Discharge: Family;Available 24 hours/day Type of Home: House Home Access: Stairs to enter [employer] of Steps: 3 Entrance Stairs-Rails: Right ([place] down) Home Layout: One level     Bathroom Shower/Tub: Occupational psychologist: Standard Bathroom Accessibility: Yes How Accessible: Accessible via walker Home Equipment: [place] - single point;Walker - 2 wheels;Wheelchair - manual;Shower seat   Additional Comments: Per pt report.      Prior Functioning/Environment Level of Independence: Needs assistance  Gait / Transfers Assistance Needed: household ambulator using RW ADL's / Homemaking Assistance Needed: assisted by family 24/7            OT Problem List: Decreased strength;Decreased range of motion;Decreased activity tolerance;Impaired balance (sitting and/or standing);Decreased safety awareness      OT Treatment/Interventions: Self-care/ADL  training;Therapeutic exercise;Therapeutic activities;Visual/perceptual remediation/compensation;Patient/family education;Balance training;DME and/or AE instruction    OT Goals(Current goals can be found in the care plan section) Acute Rehab OT Goals Patient Stated Goal: return home with family to assist OT Goal Formulation: With patient Time For Goal Achievement: 03/17/21 Potential to Achieve Goals: Good  OT Frequency: Min 2X/week               Co-evaluation PT/OT/SLP Co-Evaluation/Treatment: Yes Reason for Co-Treatment: To address functional/ADL transfers PT goals addressed during session: Mobility/safety with mobility;Balance;Proper use of DME OT goals addressed during session: ADL's and self-care                       End of Session Equipment Utilized During Treatment: Rolling walker  Activity Tolerance: Patient tolerated treatment well Patient left: in bed;with call bell/phone within reach  OT Visit Diagnosis: Unsteadiness on feet (R26.81);Muscle weakness (generalized) (M62.81)                Time: [id number] OT Time Calculation (min): 33 min Charges:  OT General Charges $OT Visit: 1 Visit OT Evaluation $OT Eval Low Complexity: 1 Low  Laterra Lubinski OT, MOT  Larey Seat 03/03/2021, 12:32 PM

## 2021-03-03 NOTE — Progress Notes (Signed)
Progress note  Patient: Lindsey Butler                            PCP: Neale Burly, MD                    DOB: November 07, 1928            DOA: 03/02/2021 [id number]             DOS: 03/03/2021, 9:17 AM  Patient coming from:   HOME  I have personally reviewed patient's medical records, in electronic medical records, including:  [facility] [facility], and care everywhere.    Neale Burly, MD  CC: Shortness of breath   Subjective:   The patient was seen and examined this morning, awake alert oriented x3, reporting progressive shortness of breath with exertion, at rest stable, currently satting 98% on room air, Remained stable overnight, afebrile normotensive   HPI/Hospital course    Lindsey Butler is a 87 y.o. female with medical history significant of HTN, systolic heart failure, generalized weakness, akinesis, asthma, GERD, HLD, hypothyroidism, osteoarthritis, chronic back pain...   Presenting with cough congestion, shortness of breath mild chest tightness with cough.  Since Sunday. With history of asthma, multiple breathing treatment at home with no relief. Also complaining of headaches myalgia decreased appetite.    Patient Denies having: Fever, Chills, Chest Pain, Abd pain, N/V/D, headache, dizziness, lightheadedness,  Dysuria, Joint pain, rash, open wounds  ED Course:   Blood pressure (!) 155/61, pulse (!) 44, temperature (!) 97.5 F (36.4 C), temperature source Oral, resp. rate 19, height '5\' 6"'  (1.676 m), weight 57.8 kg, SpO2 98 %.  Abnormal labs;  CMP within normal limits glucose 100, BNP 200, troponin 8, lactic acid 1.8, WBC 15.4, D-dimer 0.55, Influenza A/B, SARS-CoV-2 negative CTA:  IMPRESSION: 1. No evidence of pulmonary embolism. 2. New areas peripheral pulmonary opacity in the lingula, right upper lobe and right middle lobe are suspicious for pneumonia. 3. Stable compression fractures at T8 and L1.    Assessment / Plan:   Principal Problem:   PNA  (pneumonia) Active Problems:   Asthma   Essential hypertension   Major depression, recurrent, chronic (HCC)   Hiatal hernia with GERD without esophagitis   Hypothyroidism   Osteoporosis   Principal Problem:  SIRS due to PNA (pneumonia) -Improved, remain 98% on room air, shortness of breath with exertion, improved cough -Afebrile normotensive  -On admission met SIRS criteria with temp of 101.4, RR 21, satting 96% on room air, WBC of 14.4 -Sepsis ruled out -Status post treatment per sepsis protocol with IV fluids, broad-spectrum antibiotics -Blood cultures have been obtained, will try to obtain sputum culture -IV antibiotics of Rocephin and azithromycin --to be continued -Continue bronchodilator breathing treatment, as needed, incentive spirometer,  Mild respiratory distress -Still having global wheezing, shortness of breath with exertion -Likely exacerbated by history of asthma, pneumonia -We will continue with breathing treatment, adding steroids IV -Continue antibiotics as above  Active Problems:   Asthma -management as above, resuming home medication of Pulmicort, Singulair, stable    Essential hypertension -monitoring BP closely, anticipating restarting home medication of losartan,   Major depression, recurrent, chronic (HCC) -continue home medication of SSRI,   Hiatal hernia with GERD without esophagitis -continue Protonix,  Hypothyroidism -continue home dose of Synthroid  Osteoporosis -continue vitamin D supplements  Grade 1 diastolic congestive heart failure No documented of CHF, but  patient is on ACE inhibitor, Demadex, Last echocardiogram 03/09/2020, reporting ejection fraction 65-70%, global grade 1 diastolic congestive heart failure -moderate valvular calcifications. -Anticipating restarting her Demadex -Remained stable   Cultures:  -Blood cultures x2 -We will try to obtain sputum cultures Antimicrobial: -IV azithromycin/Rocephin  Consults called:  None -------------------------------------------------------------------------------------------------------------------------------------------- DVT prophylaxis: SCD/Compression stockings and Heparin SQ Code Status:   Code Status: Full Code   Admission status: Patient will be admitted as Inpatient, with a greater than 2 midnight length of stay. Level of care: Telemetry   Family Communication:  none at bedside  (The above findings and plan of care has been discussed with patient in detail, the patient expressed understanding and agreement of above plan)  --------------------------------------------------------------------------------------------------------------------------------------------------  Disposition Plan: >3 days Status is: Inpatient  Remains inpatient appropriate because:Inpatient level of care appropriate due to severity of illness  Dispo: The patient is from: Home              Anticipated d/c is to: Home with home health and approximately 3 days              Patient currently is not medically stable to d/c.  Requiring IV antibiotic treatment, supplemental oxygen   Difficult to place patient No         Scheduled Meds:  acetaZOLAMIDE  250 mg Oral BID   budesonide  0.5 mg Nebulization BID   heparin  5,000 Units Subcutaneous Q8H   levothyroxine  125 mcg Oral q morning   methylPREDNISolone (SOLU-MEDROL) injection  40 mg Intravenous Q12H   mirtazapine  15 mg Oral QHS   montelukast  10 mg Oral QHS   nutrition supplement (JUVEN)  1 packet Oral BID BM   pantoprazole  40 mg Oral Daily   sertraline  25 mg Oral Daily   sodium chloride flush  3 mL Intravenous Q12H   sodium chloride flush  3 mL Intravenous Q12H   Continuous Infusions:  sodium chloride 50 mL/hr at 03/02/21 2110   sodium chloride     azithromycin Stopped (03/02/21 1658)   cefTRIAXone (ROCEPHIN)  IV     PRN Meds:.sodium chloride, acetaminophen **OR** acetaminophen, albuterol, bisacodyl, hydrALAZINE,  HYDROmorphone (DILAUDID) injection, ipratropium, levalbuterol, magnesium citrate, ondansetron **OR** ondansetron (ZOFRAN) IV, oxyCODONE, senna-docusate, sodium chloride flush, traZODone  PRN MEDs: sodium chloride, acetaminophen **OR** acetaminophen, albuterol, bisacodyl, hydrALAZINE, HYDROmorphone (DILAUDID) injection, ipratropium, levalbuterol, magnesium citrate, ondansetron **OR** ondansetron (ZOFRAN) IV, oxyCODONE, senna-docusate, sodium chloride flush, traZODone  Past Medical History:  Diagnosis Date   Actinic keratosis    Asthma    GERD (gastroesophageal reflux disease)    Hyperlipidemia    Hypothyroidism    Osteoarthritis     Past Surgical History:  Procedure Laterality Date   CATARACT EXTRACTION     left and right   HIP ARTHROPLASTY Left 10/04/2019   Procedure: ARTHROPLASTY BIPOLAR HIP (HEMIARTHROPLASTY);  Surgeon: Carole Civil, MD;  Location: [facility];  Service: Orthopedics;  Laterality: Left;   THYROIDECTOMY     TONSILLECTOMY     TOTAL KNEE ARTHROPLASTY     right and left knee   VAGINAL HYSTERECTOMY       reports that she has never smoked. She has never used smokeless tobacco. She reports that she does not drink alcohol and does not use drugs.   Family History  Problem Relation Age of Onset   Cancer Son    Liver cancer Brother     Physical Exam:   Vitals:   03/03/21 0459 03/03/21 0500 03/03/21 0645 03/03/21  0723  BP: (!) 149/79  (!) 155/61   Pulse: (!) 58  (!) 44   Resp: 19  19   Temp: (!) 97.5 F (36.4 C)  (!) 97.5 F (36.4 C)   TempSrc: Oral  Oral   SpO2: 97%  98% 98%  Weight:  57.8 kg    Height:         Physical Exam:   General:  Alert, oriented, cooperative, mild to moderate shortness of breath  HEENT:  Normocephalic, PERRL, otherwise with in Normal limits   Neuro:  CNII-XII intact. , normal motor and sensation, reflexes intact   Lungs:   Clear to auscultation BL, Respirations unlabored, no wheezes / crackles  Cardio:    S1/S2, RRR, No  murmure, No Rubs or Gallops   Abdomen:   Soft, non-tender, bowel sounds active all four quadrants,  no guarding or peritoneal signs.  Muscular skeletal:  Global generalized weaknesses, Limited exam - in bed, able to move all 4 extremities,  2+ pulses,  symmetric, No pitting edema  Skin:  Dry, warm to touch, negative for any Rashes,  Wounds: Please see nursing documentation           Labs on admission:    I have personally reviewed following labs and imaging studies  CBC: Recent Labs  Lab 03/02/21 1015 03/02/21 1409 03/03/21 0539  WBC 15.4* 14.3* 11.2*  NEUTROABS 13.0*  --   --   HGB 13.1 11.7* 11.1*  HCT 41.3 37.1 35.5*  MCV 101.2* 99.5 100.6*  PLT 301 255 509   Basic Metabolic Panel: Recent Labs  Lab 03/02/21 1015 03/02/21 1409 03/03/21 0539  NA 135  --  137  K 3.9  --  4.4  CL 105  --  110  CO2 22  --  22  GLUCOSE 100*  --  148*  BUN 22  --  19  CREATININE 0.81 0.69 0.73  CALCIUM 10.1  --  9.3   GFR: Estimated Creatinine Clearance: 42.6 mL/min (by C-G formula based on SCr of 0.73 mg/dL). Liver Function Tests: Recent Labs  Lab 03/02/21 1015  AST 14*  ALT 13  ALKPHOS 72  BILITOT 0.4  PROT 7.1  ALBUMIN 3.4*   No results for input(s): LIPASE, AMYLASE in the last 168 hours. No results for input(s): AMMONIA in the last 168 hours. Coagulation Profile: Recent Labs  Lab 03/02/21 1019 03/03/21 0539  INR 1.0 1.0   Cardiac Enzymes: No results for input(s): CKTOTAL, CKMB, CKMBINDEX, TROPONINI in the last 168 hours. BNP (last 3 results) No results for input(s): PROBNP in the last 8760 hours. HbA1C: No results for input(s): HGBA1C in the last 72 hours. CBG: No results for input(s): GLUCAP in the last 168 hours. Lipid Profile: No results for input(s): CHOL, HDL, LDLCALC, TRIG, CHOLHDL, LDLDIRECT in the last 72 hours. Thyroid Function Tests: No results for input(s): TSH, T4TOTAL, FREET4, T3FREE, THYROIDAB in the last 72 hours. Anemia Panel: No results  for input(s): VITAMINB12, FOLATE, FERRITIN, TIBC, IRON, RETICCTPCT in the last 72 hours. Urine analysis:    Component Value Date/Time   COLORURINE YELLOW 03/02/2021 [place] 03/02/2021 1539   LABSPEC 1.026 03/02/2021 1539   PHURINE 6.0 03/02/2021 1539   GLUCOSEU NEGATIVE 03/02/2021 1539   HGBUR SMALL (A) 03/02/2021 1539   BILIRUBINUR NEGATIVE 03/02/2021 [place] 03/02/2021 1539   PROTEINUR NEGATIVE 03/02/2021 1539   NITRITE POSITIVE (A) 03/02/2021 1539   LEUKOCYTESUR LARGE (A) 03/02/2021 1539  Radiologic Exams on Admission:   CT Angio Chest PE W/Cm &/Or Wo Cm  Result Date: 03/02/2021 CLINICAL DATA:  Cough, congestion, leukocytosis and elevated D-dimer. EXAM: CT ANGIOGRAPHY CHEST WITH CONTRAST TECHNIQUE: Multidetector CT imaging of the chest was performed using the standard protocol during bolus administration of intravenous contrast. Multiplanar CT image reconstructions and MIPs were obtained to evaluate the vascular anatomy. CONTRAST:  141m OMNIPAQUE IOHEXOL 350 MG/ML SOLN COMPARISON:  Prior CTA of the chest 03/20/2020 at [facility]: The pulmonary arteries are well opacified. There is no evidence of pulmonary embolism. Central pulmonary arteries are normal in caliber. Stable atherosclerosis of the thoracic aorta without evidence of aneurysm or dissection. The heart size is stable and within normal limits. No pericardial fluid. Stable calcified coronary artery plaque and heavily calcified aortic valve, mitral valve and mitral valve annulus. Mediastinum/Nodes: Stable large hiatal hernia with the majority the stomach in the chest. No enlarged lymph nodes identified. Lungs/Pleura: Some of the areas of pneumonia seen previously have resolved. New focal peripheral opacity in the posterolateral lingula in area roughly 2 cm diameter, nodular densities following bronchovascular distribution in the posterior right upper lobe and  consolidation and atelectasis in the inferior right middle lobe. Findings likely represent new areas of infection. No pulmonary edema, pneumothorax or pleural fluid identified. Upper Abdomen: No acute abnormality. Musculoskeletal: Stable osteopenia and compression deformities at the T8 and L1 levels. Review of the MIP images confirms the above findings. IMPRESSION: 1. No evidence of pulmonary embolism. 2. New areas peripheral pulmonary opacity in the lingula, right upper lobe and right middle lobe are suspicious for pneumonia. 3. Stable compression fractures at T8 and L1. Electronically Signed   By: GAletta EdouardM.D.   On: 03/02/2021 11:50   DG Chest Port 1 View  Result Date: 03/02/2021 CLINICAL DATA:  Cough and congestion. EXAM: PORTABLE CHEST 1 VIEW COMPARISON:  Chest x-ray 01/21/2021 FINDINGS: The cardiac silhouette, mediastinal and hilar contours are stable. There is moderate tortuosity and mild calcification of the thoracic aorta. The lungs are clear of an acute process. No pleural effusions or pulmonary lesions. Stable large hiatal hernia. IMPRESSION: No acute cardiopulmonary findings. Electronically Signed   By: PMarijo SanesM.D.   On: 03/02/2021 10:15    EKG:   Independently reviewed.  Orders placed or performed during the hospital encounter of 03/02/21   ED EKG   ED EKG   EKG 12-Lead   ---------------------------------------------------------------------------------------------------------------------------------------  ----------------------------------------------------------------------------------------------------------------------------------------------------  Time spent: > than  35  Min.   SIGNED: SDeatra James MD, FHM. [facility],  Pager (Please use [email or web address] to page to text)  If 7PM-7AM, please contact night-coverage [email or web address],  03/03/2021, 9:17 AM

## 2021-03-04 DIAGNOSIS — R6510 Systemic inflammatory response syndrome (SIRS) of non-infectious origin without acute organ dysfunction: Secondary | ICD-10-CM

## 2021-03-04 LAB — CBC
HCT: 36.3 % (ref 36.0–46.0)
Hemoglobin: 11 g/dL — ABNORMAL LOW (ref 12.0–15.0)
MCH: 30.9 pg (ref 26.0–34.0)
MCHC: 30.3 g/dL (ref 30.0–36.0)
MCV: 102 fL — ABNORMAL HIGH (ref 80.0–100.0)
Platelets: 302 10*3/uL (ref 150–400)
RBC: 3.56 MIL/uL — ABNORMAL LOW (ref 3.87–5.11)
RDW: 13.4 % (ref 11.5–15.5)
WBC: 13.2 10*3/uL — ABNORMAL HIGH (ref 4.0–10.5)
nRBC: 0 % (ref 0.0–0.2)

## 2021-03-04 LAB — BASIC METABOLIC PANEL
Anion gap: 6 (ref 5–15)
BUN: 32 mg/dL — ABNORMAL HIGH (ref 8–23)
CO2: 21 mmol/L — ABNORMAL LOW (ref 22–32)
Calcium: 9.5 mg/dL (ref 8.9–10.3)
Chloride: 111 mmol/L (ref 98–111)
Creatinine, Ser: 0.71 mg/dL (ref 0.44–1.00)
GFR, Estimated: >60 mL/min (ref >60)
Glucose, Bld: 124 mg/dL — ABNORMAL HIGH (ref 70–99)
Potassium: 4.1 mmol/L (ref 3.5–5.1)
Sodium: 138 mmol/L (ref 135–145)

## 2021-03-04 LAB — GLUCOSE, CAPILLARY: Glucose-Capillary: 110 mg/dL — ABNORMAL HIGH (ref 70–99)

## 2021-03-04 MED ORDER — BUDESONIDE 0.5 MG/2ML IN SUSP
0.5000 mg | Freq: Two times a day (BID) | RESPIRATORY_TRACT | Status: DC
  Administered 2021-03-05: 0.5 mg via RESPIRATORY_TRACT
  Filled 2021-03-04: qty 2

## 2021-03-04 NOTE — Progress Notes (Signed)
Progress note  Patient: Lindsey Butler                            PCP: Neale Burly, MD                    DOB: Mar 03, 1929            DOA: 03/02/2021 [id number]             DOS: 03/04/2021, 6:46 PM  Patient coming from:   HOME  I have personally reviewed patient's medical records, in electronic medical records, including:  [facility], and care everywhere.    Neale Burly, MD  CC: Shortness of breath   Subjective:   -Cough dyspnea wheezing noted -Patient states that she is feeling better,  no fevers, no vomiting --Possible discharge home on 03/05/2021 if continues to improve   HPI/Hospital course    Lindsey Butler is a 87 y.o. female with medical history significant of HTN, systolic heart failure, generalized weakness, akinesis, asthma, GERD, HLD, hypothyroidism, osteoarthritis, chronic back pain...   Presenting with cough congestion, shortness of breath mild chest tightness with cough.  Since Sunday. With history of asthma, multiple breathing treatment at home with no relief. Also complaining of headaches myalgia decreased appetite.    Patient Denies having: Fever, Chills, Chest Pain, Abd pain, N/V/D, headache, dizziness, lightheadedness,  Dysuria, Joint pain, rash, open wounds  ED Course:   Blood pressure 138/80, pulse 77, temperature 98.4 F (36.9 C), temperature source Oral, resp. rate 20, height '5\' 6"'$  (1.676 m), weight 57.8 kg, SpO2 98 %.  Abnormal labs;  CMP within normal limits glucose 100, BNP 200, troponin 8, lactic acid 1.8, WBC 15.4, D-dimer 0.55, Influenza A/B, SARS-CoV-2 negative CTA:  IMPRESSION: 1. No evidence of pulmonary embolism. 2. New areas peripheral pulmonary opacity in the lingula, right upper lobe and right middle lobe are suspicious for pneumonia. 3. Stable compression fractures at T8 and L1.    Assessment / Plan:   Principal Problem:   PNA (pneumonia) Active Problems:   Asthma   Essential hypertension   Major  depression, recurrent, chronic (HCC)   Hiatal hernia with GERD without esophagitis   Hypothyroidism   Osteoporosis   Principal Problem:  SIRS due to PNA (pneumonia) -Cough, dyspnea persist -WBC remains elevated -No fevers, -Becomes wheezy from time to time however -Continue IV Rocephin/azithromycin along with bronchodilators and mucolytics -Possible discharge home on 03/05/2021 if continues to improve    Active Problems:   Asthma -management as above, resuming home medication of Pulmicort, Singulair, stable    Essential hypertension -monitoring BP closely, c/n  losartan,   Major depression, recurrent, chronic (HCC) -continue home medication of SSRI,   Hiatal hernia with GERD without esophagitis -continue Protonix,  Hypothyroidism -continue home dose of Synthroid  Osteoporosis -continue vitamin D supplements  Grade 1 diastolic congestive heart failure/HFpEF -PTA patient was on ACE inhibitor, Demadex, Last echocardiogram 03/09/2020, reporting ejection fraction 65-70%, global grade 1 diastolic congestive heart failure -moderate valvular calcifications.  Cultures:  -Blood cultures x2 -We will try to obtain sputum cultures Antimicrobial: -IV azithromycin/Rocephin  Consults called: None -------------------------------------------------------------------------------------------------------------------------------------------- DVT prophylaxis: SCD/Compression stockings and Heparin SQ Code Status:   Code Status: Full Code   Admission status: Patient will be admitted as Inpatient, with a greater than 2 midnight length of stay. Level of care: Telemetry   Family Communication:  none at bedside  (The above findings  and plan of care has been discussed with patient in detail, the patient expressed understanding and agreement of above plan)   --------------------------------------------------------------------------------------------------------------------------------------------------  Disposition Plan: 1 days Status is: Inpatient  Remains inpatient appropriate because:Inpatient level of care appropriate due to severity of illness  Dispo: The patient is from: Home              Anticipated d/c is to: Home with home health and approximately 1  day              Patient currently is not medically stable to d/c.  Requiring IV antibiotic treatment, -Possible discharge home on 03/05/2021 if continues to improve   Difficult to place patient No         Scheduled Meds:  acetaZOLAMIDE  250 mg Oral BID   budesonide  0.5 mg Nebulization BID   heparin  5,000 Units Subcutaneous Q8H   levothyroxine  125 mcg Oral q morning   methylPREDNISolone (SOLU-MEDROL) injection  40 mg Intravenous Q12H   mirtazapine  15 mg Oral QHS   montelukast  10 mg Oral QHS   nutrition supplement (JUVEN)  1 packet Oral BID BM   pantoprazole  40 mg Oral Daily   sertraline  25 mg Oral Daily   sodium chloride flush  3 mL Intravenous Q12H   sodium chloride flush  3 mL Intravenous Q12H   Continuous Infusions:  sodium chloride     azithromycin 500 mg (03/04/21 1451)   cefTRIAXone (ROCEPHIN)  IV 2 g (03/04/21 1618)   PRN Meds:.sodium chloride, acetaminophen **OR** acetaminophen, albuterol, bisacodyl, hydrALAZINE, HYDROmorphone (DILAUDID) injection, ipratropium, levalbuterol, magnesium citrate, ondansetron **OR** ondansetron (ZOFRAN) IV, oxyCODONE, senna-docusate, sodium chloride flush, traZODone  PRN MEDs: sodium chloride, acetaminophen **OR** acetaminophen, albuterol, bisacodyl, hydrALAZINE, HYDROmorphone (DILAUDID) injection, ipratropium, levalbuterol, magnesium citrate, ondansetron **OR** ondansetron (ZOFRAN) IV, oxyCODONE, senna-docusate, sodium chloride flush, traZODone  Past Medical History:  Diagnosis Date   Actinic keratosis    Asthma    GERD  (gastroesophageal reflux disease)    Hyperlipidemia    Hypothyroidism    Osteoarthritis     Past Surgical History:  Procedure Laterality Date   CATARACT EXTRACTION     left and right   HIP ARTHROPLASTY Left 10/04/2019   Procedure: ARTHROPLASTY BIPOLAR HIP (HEMIARTHROPLASTY);  Surgeon: Carole Civil, MD;  Location: [facility];  Service: Orthopedics;  Laterality: Left;   THYROIDECTOMY     TONSILLECTOMY     TOTAL KNEE ARTHROPLASTY     right and left knee   VAGINAL HYSTERECTOMY       reports that she has never smoked. She has never used smokeless tobacco. She reports that she does not drink alcohol and does not use drugs.   Family History  Problem Relation Age of Onset   Cancer Son    Liver cancer Brother     Physical Exam:   Vitals:   03/03/21 2057 03/04/21 0535 03/04/21 1128 03/04/21 1404  BP: (!) 154/75 (!) 146/73  138/80  Pulse: 67 (!) 56  77  Resp: '13 14  20  '$ Temp: 97.7 F (36.5 C) 98.7 F (37.1 C)  98.4 F (36.9 C)  TempSrc: Oral   Oral  SpO2: 98% 100% 99% 98%  Weight:      Height:         Physical Exam:    Physical Exam  Gen:- Awake Alert,  in no apparent distress  HEENT:- [place].AT, No sclera icterus Neck-Supple Neck,No JVD,.  Lungs-diminished breath sounds slightly on the right, few  scattered rhonchi on the right, few wheezes CV- S1, S2 normal Abd-  +ve B.Sounds, Abd Soft, No tenderness,    Extremity/Skin:- No  edema,   good pulses Psych-affect is appropriate, oriented x3 Neuro-generalized weakness, no new focal deficits, no tremors            Labs on admission:    I have personally reviewed following labs and imaging studies  CBC: Recent Labs  Lab 03/02/21 1015 03/02/21 1409 03/03/21 0539 03/04/21 0607  WBC 15.4* 14.3* 11.2* 13.2*  NEUTROABS 13.0*  --   --   --   HGB 13.1 11.7* 11.1* 11.0*  HCT 41.3 37.1 35.5* 36.3  MCV 101.2* 99.5 100.6* 102.0*  PLT 301 255 228 [id number]   Basic Metabolic Panel: Recent Labs  Lab 03/02/21 1015  03/02/21 1409 03/03/21 0539 03/04/21 0607  NA 135  --  137 138  K 3.9  --  4.4 4.1  CL 105  --  110 111  CO2 22  --  22 21*  GLUCOSE 100*  --  148* 124*  BUN 22  --  19 32*  CREATININE 0.81 0.69 0.73 0.71  CALCIUM 10.1  --  9.3 9.5   GFR: Estimated Creatinine Clearance: 42.6 mL/min (by C-G formula based on SCr of 0.71 mg/dL). Liver Function Tests: Recent Labs  Lab 03/02/21 1015  AST 14*  ALT 13  ALKPHOS 72  BILITOT 0.4  PROT 7.1  ALBUMIN 3.4*   No results for input(s): LIPASE, AMYLASE in the last 168 hours. No results for input(s): AMMONIA in the last 168 hours. Coagulation Profile: Recent Labs  Lab 03/02/21 1019 03/03/21 0539  INR 1.0 1.0   Cardiac Enzymes: No results for input(s): CKTOTAL, CKMB, CKMBINDEX, TROPONINI in the last 168 hours. BNP (last 3 results) No results for input(s): PROBNP in the last 8760 hours. HbA1C: No results for input(s): HGBA1C in the last 72 hours. CBG: Recent Labs  Lab 03/04/21 0731  GLUCAP 110*   Lipid Profile: No results for input(s): CHOL, HDL, LDLCALC, TRIG, CHOLHDL, LDLDIRECT in the last 72 hours. Thyroid Function Tests: No results for input(s): TSH, T4TOTAL, FREET4, T3FREE, THYROIDAB in the last 72 hours. Anemia Panel: No results for input(s): VITAMINB12, FOLATE, FERRITIN, TIBC, IRON, RETICCTPCT in the last 72 hours. Urine analysis:    Component Value Date/Time   COLORURINE YELLOW 03/02/2021 [place] 03/02/2021 1539   LABSPEC 1.026 03/02/2021 1539   PHURINE 6.0 03/02/2021 1539   GLUCOSEU NEGATIVE 03/02/2021 1539   HGBUR SMALL (A) 03/02/2021 1539   BILIRUBINUR NEGATIVE 03/02/2021 1539   KETONESUR NEGATIVE 03/02/2021 1539   PROTEINUR NEGATIVE 03/02/2021 1539   NITRITE POSITIVE (A) 03/02/2021 1539   LEUKOCYTESUR LARGE (A) 03/02/2021 1539     Radiologic Exams on Admission:   No results found.  EKG:   Independently reviewed.  Orders placed or performed during the hospital encounter of 03/02/21    ED EKG   ED EKG   EKG 12-Lead   EKG    SIGNED: Roxan Hockey, MD, [facility],  Pager (Please use [email or web address] to page to text)  If 7PM-7AM, please contact night-coverage [email or web address],  03/04/2021, 6:46 PM

## 2021-03-05 DIAGNOSIS — R6510 Systemic inflammatory response syndrome (SIRS) of non-infectious origin without acute organ dysfunction: Secondary | ICD-10-CM | POA: Diagnosis not present

## 2021-03-05 LAB — BASIC METABOLIC PANEL WITH GFR
Anion gap: 6 (ref 5–15)
BUN: 30 mg/dL — ABNORMAL HIGH (ref 8–23)
CO2: 23 mmol/L (ref 22–32)
Calcium: 9.5 mg/dL (ref 8.9–10.3)
Chloride: 111 mmol/L (ref 98–111)
Creatinine, Ser: 0.82 mg/dL (ref 0.44–1.00)
GFR, Estimated: >60 mL/min
Glucose, Bld: 112 mg/dL — ABNORMAL HIGH (ref 70–99)
Potassium: 3.9 mmol/L (ref 3.5–5.1)
Sodium: 140 mmol/L (ref 135–145)

## 2021-03-05 LAB — GLUCOSE, CAPILLARY
Glucose-Capillary: 104 mg/dL — ABNORMAL HIGH (ref 70–99)
Glucose-Capillary: 102 mg/dL — ABNORMAL HIGH (ref 70–99)

## 2021-03-05 LAB — CBC
HCT: 38.7 % (ref 36.0–46.0)
Hemoglobin: 11.9 g/dL — ABNORMAL LOW (ref 12.0–15.0)
MCH: 31.6 pg (ref 26.0–34.0)
MCHC: 30.7 g/dL (ref 30.0–36.0)
MCV: 102.7 fL — ABNORMAL HIGH (ref 80.0–100.0)
Platelets: 303 K/uL (ref 150–400)
RBC: 3.77 MIL/uL — ABNORMAL LOW (ref 3.87–5.11)
RDW: 13.5 % (ref 11.5–15.5)
WBC: 10.5 K/uL (ref 4.0–10.5)
nRBC: 0 % (ref 0.0–0.2)

## 2021-03-05 MED ORDER — GUAIFENESIN ER 600 MG PO TB12: 600.0000 mg | ORAL_TABLET | Freq: Two times a day (BID) | ORAL | 0 refills | Status: AC

## 2021-03-05 MED ORDER — ALBUTEROL SULFATE HFA 108 (90 BASE) MCG/ACT IN AERS: 2.0000 | INHALATION_SPRAY | RESPIRATORY_TRACT | 0 refills | Status: DC | PRN

## 2021-03-05 MED ORDER — PREDNISONE 20 MG PO TABS: 40.0000 mg | ORAL_TABLET | Freq: Every day | ORAL | 0 refills | Status: AC

## 2021-03-05 MED ORDER — BUDESONIDE 0.5 MG/2ML IN SUSP: 0.5000 mg | Freq: Two times a day (BID) | RESPIRATORY_TRACT | 5 refills | Status: DC

## 2021-03-05 MED ORDER — AZITHROMYCIN 500 MG PO TABS: 500.0000 mg | ORAL_TABLET | Freq: Every day | ORAL | 0 refills | Status: AC

## 2021-03-05 MED ORDER — IPRATROPIUM-ALBUTEROL 0.5-2.5 (3) MG/3ML IN SOLN: 3.0000 mL | Freq: Four times a day (QID) | RESPIRATORY_TRACT | 3 refills | Status: DC | PRN

## 2021-03-05 MED ORDER — CEFDINIR 300 MG PO CAPS: 300.0000 mg | ORAL_CAPSULE | Freq: Two times a day (BID) | ORAL | 0 refills | Status: AC

## 2021-03-05 NOTE — Discharge Summary (Signed)
Lindsey Butler, is a 87 y.o. female  DOB Oct 30, 1928  MRN [id number].  Admission date:  03/02/2021  Admitting Physician  Deatra James, MD  Discharge Date:  03/05/2021   Primary MD  Neale Burly, MD  Recommendations for primary care physician for things to follow:   1) please take inhaler or nebulizer treatments as needed for cough, wheezing or shortness of breath 2)Please Take azithromycin and Omnicef/cefdinir  antibiotics as prescribed for your respiratory infection/pneumonia 2)follow up with Neale Burly, MD within a week for recheck and reevaluation   Admission Diagnosis  Weakness [R53.1] Moderate persistent asthma with exacerbation [J45.41] PNA (pneumonia) [J18.9] Sepsis ([place]) [A41.9] Community acquired pneumonia of right lung, unspecified part of lung [J18.9] Sepsis without acute organ dysfunction, due to unspecified organism [facility]) [A41.9]   Discharge Diagnosis  Weakness [R53.1] Moderate persistent asthma with exacerbation [J45.41] PNA (pneumonia) [J18.9] Sepsis ([place]) [A41.9] Community acquired pneumonia of right lung, unspecified part of lung [J18.9] Sepsis without acute organ dysfunction, due to unspecified organism ([place]) [A41.9]    Principal Problem:   SIRS (systemic inflammatory response syndrome) ([place]) Active Problems:   Asthma   Essential hypertension   Major depression, recurrent, chronic ([place])   Hiatal hernia with GERD without esophagitis   Hypothyroidism   Osteoporosis   PNA (pneumonia)      Past Medical History:  Diagnosis Date   Actinic keratosis    Asthma    GERD (gastroesophageal reflux disease)    Hyperlipidemia    Hypothyroidism    Osteoarthritis     Past Surgical History:  Procedure Laterality Date   CATARACT EXTRACTION     left and right   HIP ARTHROPLASTY Left 10/04/2019   Procedure: ARTHROPLASTY BIPOLAR HIP (HEMIARTHROPLASTY);  Surgeon:  Carole Civil, MD;  Location: [facility];  Service: Orthopedics;  Laterality: Left;   THYROIDECTOMY     TONSILLECTOMY     TOTAL KNEE ARTHROPLASTY     right and left knee   VAGINAL HYSTERECTOMY       HPI  from the history and physical done on the day of admission:    Lindsey Butler is a 87 y.o. female with medical history significant of HTN, systolic heart failure, generalized weakness, akinesis, asthma, GERD, HLD, hypothyroidism, osteoarthritis, chronic back pain...    Presenting with cough congestion, shortness of breath mild chest tightness with cough.  Since Sunday. With history of asthma, multiple breathing treatment at home with no relief. Also complaining of headaches myalgia decreased appetite.      Patient Denies having: Fever, Chills, Chest Pain, Abd pain, N/V/D, headache, dizziness, lightheadedness,  Dysuria, Joint pain, rash, open wounds   ED Course:   Blood pressure (!) 114/56, pulse 84, temperature (!) 101.4 F (38.6 C), temperature source Rectal, resp. rate (!) 21, height _0  (1.676 m), weight 57.6 kg, SpO2 96 %.   Abnormal labs;  CMP within normal limits glucose 100, BNP 200, troponin 8, lactic acid 1.8, WBC 15.4, D-dimer 0.55, Influenza A/B, SARS-CoV-2 negative CTA:  IMPRESSION: 1. No evidence of pulmonary embolism. 2. New areas peripheral pulmonary opacity in the lingula, right upper lobe and right middle lobe are suspicious for pneumonia. 3. Stable compression fractures at T8 and L1.     Hospital Course:     Principal Problem:   Sepsis due to PNA (pneumonia)--POA -Patient met sepsis criteria with leukocytosis, fevers and tachypnea ---Treated with IV Rocephin/azithromycin along with bronchodilators and mucolytics -Respiratory status improved -Sepsis pathophysiology resolved -Okay to discharge on Omnicef and azithromycin, along with bronchodilators and mucolytics     Active Problems:   Asthma -management as above, resuming home medication of  Pulmicort, Singulair, stable    Essential hypertension -stable, c/n  losartan,   Major depression, recurrent, chronic (HCC) -continue home medication of SSRI,   Hiatal hernia with GERD without esophagitis -continue Protonix,   Hypothyroidism -continue home dose of Synthroid   Osteoporosis -continue vitamin D supplements   Grade 1 diastolic congestive heart failure/HFpEF -PTA patient was on ACE inhibitor, Demadex, Last echocardiogram 03/09/2020, reporting ejection fraction 65-70%, global grade 1 diastolic congestive heart failure -moderate valvular calcifications -Stable.    Discharge Condition: Stable, son at bedside at time of discharge -Plan of care discussed with son, questions answered  Follow UP   Follow-up Information     Neale Burly, MD. Schedule an appointment as soon as possible for a visit in 1 week(s).   Specialty: Internal Medicine Contact information: [place] [address] [phone]                 Consults obtained -   Diet and Activity recommendation:  As advised  Discharge Instructions    Discharge Instructions     Call MD for:  difficulty breathing, headache or visual disturbances   Complete by: As directed    Call MD for:  persistant dizziness or light-headedness   Complete by: As directed    Call MD for:  persistant nausea and vomiting   Complete by: As directed    Call MD for:  temperature >100.4   Complete by: As directed    Diet - low sodium heart healthy   Complete by: As directed    Discharge instructions   Complete by: As directed    1) please take inhaler or nebulizer treatments as needed for cough, wheezing or shortness of breath 2)Please Take azithromycin and Omnicef/cefdinir  antibiotics as prescribed for your respiratory infection/pneumonia 2)follow up with Neale Burly, MD within a week for recheck and reevaluation   Discharge wound care:   Complete by: As directed    Keep clean and dry    Increase activity slowly   Complete by: As directed          Discharge Medications     Allergies as of 03/05/2021   No Known Allergies      Medication List     STOP taking these medications    docusate sodium 100 MG capsule Commonly known as: COLACE   esomeprazole 40 MG capsule Commonly known as: NexIUM   furosemide 40 MG tablet Commonly known as: LASIX   gabapentin 100 MG capsule Commonly known as: NEURONTIN   losartan 50 MG tablet Commonly known as: COZAAR   multivitamin with minerals Tabs tablet   potassium chloride SA 20 MEQ tablet Commonly known as: KLOR-CON       TAKE these medications    acetaminophen 650 MG CR tablet Commonly known as: TYLENOL Take 650 mg by mouth every 6 (six)  hours as needed for pain.   acetaZOLAMIDE 250 MG tablet Commonly known as: DIAMOX Take 1 tablet (250 mg total) by mouth 2 (two) times daily.   albuterol 108 (90 Base) MCG/ACT inhaler Commonly known as: VENTOLIN HFA Inhale 2 puffs into the lungs every 4 (four) hours as needed for wheezing or shortness of breath.   azithromycin 500 MG tablet Commonly known as: ZITHROMAX Take 1 tablet (500 mg total) by mouth daily for 3 days. Start taking on: March 06, 2021   budesonide 0.5 MG/2ML nebulizer solution Commonly known as: PULMICORT Take 2 mLs (0.5 mg total) by nebulization 2 (two) times daily.   cefdinir 300 MG capsule Commonly known as: OMNICEF Take 1 capsule (300 mg total) by mouth 2 (two) times daily for 5 days.   cholecalciferol 25 MCG (1000 UNIT) tablet Commonly known as: VITAMIN D Take 1,000 Units by mouth daily.   feeding supplement Liqd Take 237 mLs by mouth 2 (two) times daily between meals. What changed: Another medication with the same name was removed. Continue taking this medication, and follow the directions you see here.   guaiFENesin 600 MG 12 hr tablet Commonly known as: Mucinex Take 1 tablet (600 mg total) by mouth 2 (two) times daily for 10  days.   ipratropium-albuterol 0.5-2.5 (3) MG/3ML Soln Commonly known as: DUONEB Take 3 mLs by nebulization every 6 (six) hours as needed (SOB).   mirtazapine 15 MG tablet Commonly known as: REMERON Take 15 mg by mouth at bedtime.   montelukast 10 MG tablet Commonly known as: SINGULAIR Take 1 tablet (10 mg total) by mouth at bedtime.   NON FORMULARY Diet: _____ Regular,  __x____ NAS,  _______Consistent Carbohydrate,  _______NPO  _____Other   pantoprazole 40 MG tablet Commonly known as: PROTONIX Take 40 mg by mouth daily.   predniSONE 20 MG tablet Commonly known as: DELTASONE Take 2 tablets (40 mg total) by mouth daily with breakfast for 5 days. What changed:  medication strength how much to take when to take this   sertraline 25 MG tablet Commonly known as: ZOLOFT Take 1 tablet (25 mg total) by mouth daily.   vitamin B-12 1000 MCG tablet Commonly known as: CYANOCOBALAMIN Take 1,000 mcg by mouth daily.       ASK your doctor about these medications    levothyroxine 125 MCG tablet Commonly known as: SYNTHROID Take 1 tablet (125 mcg total) by mouth every morning.               Discharge Care Instructions  (From admission, onward)           Start     Ordered   03/05/21 0000  Discharge wound care:       Comments: Keep clean and dry   03/05/21 1549            Major procedures and Radiology Reports - PLEASE review detailed and final reports for all details, in brief -   CT Angio Chest PE W/Cm &/Or Wo Cm  Result Date: 03/02/2021 CLINICAL DATA:  Cough, congestion, leukocytosis and elevated D-dimer. EXAM: CT ANGIOGRAPHY CHEST WITH CONTRAST TECHNIQUE: Multidetector CT imaging of the chest was performed using the standard protocol during bolus administration of intravenous contrast. Multiplanar CT image reconstructions and MIPs were obtained to evaluate the vascular anatomy. CONTRAST:  127m OMNIPAQUE IOHEXOL 350 MG/ML SOLN COMPARISON:  [facility]  [facility]: The pulmonary arteries are well opacified. There is no evidence of  pulmonary embolism. Central pulmonary arteries are normal in caliber. Stable atherosclerosis of the thoracic aorta without evidence of aneurysm or dissection. The heart size is stable and within normal limits. No pericardial fluid. Stable calcified coronary artery plaque and heavily calcified aortic valve, mitral valve and mitral valve annulus. Mediastinum/Nodes: Stable large hiatal hernia with the majority the stomach in the chest. No enlarged lymph nodes identified. Lungs/Pleura: Some of the areas of pneumonia seen previously have resolved. New focal peripheral opacity in the posterolateral lingula in area roughly 2 cm diameter, nodular densities following bronchovascular distribution in the posterior right upper lobe and consolidation and atelectasis in the inferior right middle lobe. Findings likely represent new areas of infection. No pulmonary edema, pneumothorax or pleural fluid identified. Upper Abdomen: No acute abnormality. Musculoskeletal: Stable osteopenia and compression deformities at the T8 and L1 levels. Review of the MIP images confirms the above findings. IMPRESSION: 1. No evidence of pulmonary embolism. 2. New areas peripheral pulmonary opacity in the lingula, right upper lobe and right middle lobe are suspicious for pneumonia. 3. Stable compression fractures at T8 and L1. Electronically Signed   By: Aletta Edouard M.D.   On: 03/02/2021 11:50   DG Chest Port 1 View  Result Date: 03/02/2021 CLINICAL DATA:  Cough and congestion. EXAM: PORTABLE CHEST 1 VIEW COMPARISON:  Chest x-ray 01/21/2021 FINDINGS: The cardiac silhouette, mediastinal and hilar contours are stable. There is moderate tortuosity and mild calcification of the thoracic aorta. The lungs are clear of an acute process. No pleural effusions or pulmonary lesions. Stable large hiatal hernia. IMPRESSION: No  acute cardiopulmonary findings. Electronically Signed   By: Marijo Sanes M.D.   On: 03/02/2021 10:15    Micro Results   Recent Results (from the past 240 hour(s))  Resp Panel by RT-PCR (Flu A&B, Covid) Nasopharyngeal Swab     Status: None   Collection Time: 03/02/21  9:54 AM   Specimen: Nasopharyngeal Swab; Nasopharyngeal(NP) swabs in vial transport medium  Result Value Ref Range Status   SARS Coronavirus 2 by RT PCR NEGATIVE NEGATIVE Final    Comment: (NOTE) SARS-CoV-2 target nucleic acids are NOT DETECTED.  The SARS-CoV-2 RNA is generally detectable in upper respiratory specimens during the acute phase of infection. The lowest concentration of SARS-CoV-2 viral copies this assay can detect is 138 copies/mL. A negative result does not preclude SARS-Cov-2 infection and should not be used as the sole basis for treatment or other patient management decisions. A negative result may occur with  improper specimen collection/handling, submission of specimen other than nasopharyngeal swab, presence of viral mutation(s) within the areas targeted by this assay, and inadequate number of viral copies(<138 copies/mL). A negative result must be combined with clinical observations, patient history, and epidemiological information. The expected result is Negative.  Fact Sheet for Patients:  EntrepreneurPulse.com.au  Fact Sheet for Healthcare Providers:  IncredibleEmployment.be  This test is no t yet approved or cleared by the [place] FDA and  has been authorized for detection and/or diagnosis of SARS-CoV-2 by FDA under an Emergency Use Authorization (EUA). This EUA will remain  in effect (meaning this test can be used) for the duration of the COVID-19 declaration under Section 564(b)(1) of the Act, 21 U.S.C.section 360bbb-3(b)(1), unless the authorization is terminated  or revoked sooner.       Influenza A by PCR NEGATIVE NEGATIVE Final   Influenza  B by PCR NEGATIVE NEGATIVE Final    Comment: (NOTE) The Xpert Xpress SARS-CoV-2/FLU/RSV plus assay is intended as  an aid in the diagnosis of influenza from Nasopharyngeal swab specimens and should not be used as a sole basis for treatment. Nasal washings and aspirates are unacceptable for Xpert Xpress SARS-CoV-2/FLU/RSV testing.  Fact Sheet for Patients: EntrepreneurPulse.com.au  Fact Sheet for Healthcare Providers: IncredibleEmployment.be  This test is not yet approved or cleared by the [place] FDA and has been authorized for detection and/or diagnosis of SARS-CoV-2 by FDA under an Emergency Use Authorization (EUA). This EUA will remain in effect (meaning this test can be used) for the duration of the COVID-19 declaration under Section 564(b)(1) of the Act, 21 U.S.C. section 360bbb-3(b)(1), unless the authorization is terminated or revoked.  Performed at [facility], [address], [place], [place] [address]   Blood culture (routine x 2)     Status: None (Preliminary result)   Collection Time: 03/02/21 10:18 AM   Specimen: BLOOD  Result Value Ref Range Status   Specimen Description BLOOD LEFT ARM  Final   Special Requests   Final    BOTTLES DRAWN AEROBIC AND ANAEROBIC Blood Culture adequate volume   Culture   Final    NO GROWTH 3 DAYS Performed at [facility], [address], [place], [place] [address]    Report Status PENDING  Incomplete  Blood culture (routine x 2)     Status: None (Preliminary result)   Collection Time: 03/02/21 10:19 AM   Specimen: BLOOD  Result Value Ref Range Status   Specimen Description BLOOD RIGHT ARM  Final   Special Requests   Final    BOTTLES DRAWN AEROBIC AND ANAEROBIC Blood Culture adequate volume   Culture   Final    NO GROWTH 3 DAYS Performed at [facility], [address] [place], [place] [address]    Report Status PENDING  Incomplete       Today   Subjective    Tylynn Braniff today  has no new complaints  -No fever  Or chills   No Nausea, Vomiting or Diarrhea         Patient has been seen and examined prior to discharge   Objective   Blood pressure (!) 144/80, pulse 82, temperature 98.5 F (36.9 C), temperature source Oral, resp. rate 17, height _0  (1.676 m), weight 58.8 kg, SpO2 93 %.   Intake/Output Summary (Last 24 hours) at 03/05/2021 1552 Last data filed at 03/05/2021 0900 Gross per 24 hour  Intake 1380 ml  Output 650 ml  Net 730 ml    Exam Gen:- Awake Alert, no acute distress  HEENT:- [place].AT, No sclera icterus Neck-Supple Neck,No JVD,.  Lungs-improved air movement, no wheezing CV- S1, S2 normal, regular Abd-  +ve B.Sounds, Abd Soft, No tenderness,    Extremity/Skin:- No  edema,   good pulses Psych-affect is appropriate, oriented x3 Neuro-no new focal deficits, no tremors    Data Review   CBC w Diff:  Lab Results  Component Value Date   WBC 10.5 03/05/2021   HGB 11.9 (L) 03/05/2021   HCT 38.7 03/05/2021   PLT 303 03/05/2021   LYMPHOPCT 5 03/02/2021   MONOPCT 10 03/02/2021   EOSPCT 0 03/02/2021   BASOPCT 1 03/02/2021    CMP:  Lab Results  Component Value Date   NA 140 03/05/2021   K 3.9 03/05/2021   CL 111 03/05/2021   CO2 23 03/05/2021   BUN 30 (H) 03/05/2021   CREATININE 0.82 03/05/2021   PROT 7.1 03/02/2021   ALBUMIN 3.4 (L) 03/02/2021   BILITOT 0.4  03/02/2021   ALKPHOS 72 03/02/2021   AST 14 (L) 03/02/2021   ALT 13 03/02/2021  .   Total Discharge time is about 33 minutes  Roxan Hockey M.D on 03/05/2021 at 3:52 PM  Go to [email or web address] -  for contact info  [facility] - Office  [phone]

## 2021-03-05 NOTE — Care Management Important Message (Signed)
Important Message  Patient Details  Name: Lindsey Butler MRN: [id number] Date of Birth: Mar 06, 1929   Medicare Important Message Given:  Yes     [name] 03/05/2021, 11:50 AM

## 2021-03-05 NOTE — Discharge Instructions (Signed)
1) please take inhaler or nebulizer treatments as needed for cough, wheezing or shortness of breath 2)Please Take azithromycin and Omnicef/cefdinir  antibiotics as prescribed for your respiratory infection/pneumonia 2)follow up with Hasanaj, Samul Dada, MD within a week for recheck and reevaluation

## 2021-03-07 LAB — CULTURE, BLOOD (ROUTINE X 2)
Culture: NO GROWTH
Culture: NO GROWTH
Special Requests: ADEQUATE
Special Requests: ADEQUATE

## 2021-03-10 DIAGNOSIS — E039 Hypothyroidism, unspecified: Secondary | ICD-10-CM | POA: Diagnosis not present

## 2021-03-10 DIAGNOSIS — Z6821 Body mass index (BMI) 21.0-21.9, adult: Secondary | ICD-10-CM | POA: Diagnosis not present

## 2021-03-10 DIAGNOSIS — Z0000 Encounter for general adult medical examination without abnormal findings: Secondary | ICD-10-CM | POA: Diagnosis not present

## 2021-03-10 DIAGNOSIS — I10 Essential (primary) hypertension: Secondary | ICD-10-CM | POA: Diagnosis not present

## 2021-03-10 DIAGNOSIS — J449 Chronic obstructive pulmonary disease, unspecified: Secondary | ICD-10-CM | POA: Diagnosis not present

## 2021-04-07 DIAGNOSIS — I10 Essential (primary) hypertension: Secondary | ICD-10-CM | POA: Diagnosis not present

## 2021-04-07 DIAGNOSIS — E039 Hypothyroidism, unspecified: Secondary | ICD-10-CM | POA: Diagnosis not present

## 2021-04-07 DIAGNOSIS — J449 Chronic obstructive pulmonary disease, unspecified: Secondary | ICD-10-CM | POA: Diagnosis not present

## 2021-05-07 DIAGNOSIS — E039 Hypothyroidism, unspecified: Secondary | ICD-10-CM | POA: Diagnosis not present

## 2021-05-07 DIAGNOSIS — I10 Essential (primary) hypertension: Secondary | ICD-10-CM | POA: Diagnosis not present

## 2021-05-07 DIAGNOSIS — J449 Chronic obstructive pulmonary disease, unspecified: Secondary | ICD-10-CM | POA: Diagnosis not present

## 2021-06-07 DIAGNOSIS — E039 Hypothyroidism, unspecified: Secondary | ICD-10-CM | POA: Diagnosis not present

## 2021-06-07 DIAGNOSIS — I10 Essential (primary) hypertension: Secondary | ICD-10-CM | POA: Diagnosis not present

## 2021-06-07 DIAGNOSIS — J449 Chronic obstructive pulmonary disease, unspecified: Secondary | ICD-10-CM | POA: Diagnosis not present

## 2021-08-10 DIAGNOSIS — Z6821 Body mass index (BMI) 21.0-21.9, adult: Secondary | ICD-10-CM | POA: Diagnosis not present

## 2021-08-10 DIAGNOSIS — I10 Essential (primary) hypertension: Secondary | ICD-10-CM | POA: Diagnosis not present

## 2021-08-10 DIAGNOSIS — J449 Chronic obstructive pulmonary disease, unspecified: Secondary | ICD-10-CM | POA: Diagnosis not present

## 2021-08-10 DIAGNOSIS — Z0000 Encounter for general adult medical examination without abnormal findings: Secondary | ICD-10-CM | POA: Diagnosis not present

## 2021-08-10 DIAGNOSIS — E039 Hypothyroidism, unspecified: Secondary | ICD-10-CM | POA: Diagnosis not present

## 2021-08-26 ENCOUNTER — Emergency Department (HOSPITAL_COMMUNITY)
Admission: EM | Admit: 2021-08-26 | Discharge: 2021-08-26 | Disposition: A | Discharge status: Home / Self Care | Payer: Medicare Other | Attending: Emergency Medicine | Admitting: Emergency Medicine

## 2021-08-26 ENCOUNTER — Encounter (HOSPITAL_COMMUNITY): Payer: Self-pay

## 2021-08-26 ENCOUNTER — Emergency Department (HOSPITAL_COMMUNITY): Payer: Medicare Other

## 2021-08-26 ENCOUNTER — Other Ambulatory Visit: Payer: Self-pay

## 2021-08-26 DIAGNOSIS — M1990 Unspecified osteoarthritis, unspecified site: Secondary | ICD-10-CM | POA: Insufficient documentation

## 2021-08-26 DIAGNOSIS — K449 Diaphragmatic hernia without obstruction or gangrene: Secondary | ICD-10-CM | POA: Diagnosis not present

## 2021-08-26 DIAGNOSIS — E039 Hypothyroidism, unspecified: Secondary | ICD-10-CM | POA: Insufficient documentation

## 2021-08-26 DIAGNOSIS — M810 Age-related osteoporosis without current pathological fracture: Secondary | ICD-10-CM | POA: Diagnosis not present

## 2021-08-26 DIAGNOSIS — M25511 Pain in right shoulder: Secondary | ICD-10-CM | POA: Diagnosis not present

## 2021-08-26 DIAGNOSIS — J45909 Unspecified asthma, uncomplicated: Secondary | ICD-10-CM | POA: Diagnosis not present

## 2021-08-26 DIAGNOSIS — M25411 Effusion, right shoulder: Secondary | ICD-10-CM | POA: Diagnosis not present

## 2021-08-26 DIAGNOSIS — S81811A Laceration without foreign body, right lower leg, initial encounter: Secondary | ICD-10-CM

## 2021-08-26 DIAGNOSIS — W19XXXA Unspecified fall, initial encounter: Secondary | ICD-10-CM

## 2021-08-26 DIAGNOSIS — M7989 Other specified soft tissue disorders: Secondary | ICD-10-CM | POA: Diagnosis not present

## 2021-08-26 DIAGNOSIS — W010XXA Fall on same level from slipping, tripping and stumbling without subsequent striking against object, initial encounter: Secondary | ICD-10-CM | POA: Insufficient documentation

## 2021-08-26 DIAGNOSIS — M19011 Primary osteoarthritis, right shoulder: Secondary | ICD-10-CM

## 2021-08-26 DIAGNOSIS — M4854XA Collapsed vertebra, not elsewhere classified, thoracic region, initial encounter for fracture: Secondary | ICD-10-CM | POA: Diagnosis not present

## 2021-08-26 DIAGNOSIS — M419 Scoliosis, unspecified: Secondary | ICD-10-CM | POA: Diagnosis not present

## 2021-08-26 DIAGNOSIS — Y9301 Activity, walking, marching and hiking: Secondary | ICD-10-CM | POA: Diagnosis not present

## 2021-08-26 DIAGNOSIS — Z981 Arthrodesis status: Secondary | ICD-10-CM | POA: Diagnosis not present

## 2021-08-26 DIAGNOSIS — S8991XA Unspecified injury of right lower leg, initial encounter: Secondary | ICD-10-CM | POA: Diagnosis present

## 2021-08-26 MED ORDER — ACETAMINOPHEN 325 MG PO TABS
650.0000 mg | ORAL_TABLET | Freq: Once | ORAL | Status: AC
  Administered 2021-08-26: 650 mg via ORAL
  Filled 2021-08-26: qty 2

## 2021-08-26 NOTE — ED Provider Notes (Signed)
[facility] EMERGENCY DEPARTMENT Provider Note   CSN: [id number] Arrival date & time: 08/26/21  4193     History  Chief Complaint  Patient presents with   Lindsey Butler    Lindsey Butler is a 88 y.o. female.   Fall Pertinent negatives include no shortness of breath.   Patient has a history of asthma, hyperlipidemia, GERD, osteoarthritis, and hypothyroidism who presents to the ED after a fall.  Patient uses a walker in accidentally stumbled and tripped over her walker last evening.  Patient ended up falling and since then has had some pain in her right upper mid back area as well as her right shoulder and right leg.  She did sustain a skin tear type laceration of her lower leg and family applied a nonadhesive dressing.  She did not hit her head.  No loss of consciousness.  No new numbness weakness.  No fevers or chills.  No chest pain or shortness of breath.  History provided by both the patient as well as her son who is at the bedside  Home Medications Prior to Admission medications   Medication Sig Start Date End Date Taking? Authorizing Provider  acetaminophen (TYLENOL) 650 MG CR tablet Take 650 mg by mouth every 6 (six) hours as needed for pain.    [provider]  acetaZOLAMIDE (DIAMOX) 250 MG tablet Take 1 tablet (250 mg total) by mouth 2 (two) times daily. 11/01/19   Gerlene Fee, NP  albuterol (VENTOLIN HFA) 108 (90 Base) MCG/ACT inhaler Inhale 2 puffs into the lungs every 4 (four) hours as needed for wheezing or shortness of breath. 03/05/21   Emokpae, Courage, MD  budesonide (PULMICORT) 0.5 MG/2ML nebulizer solution Take 2 mLs (0.5 mg total) by nebulization 2 (two) times daily. 03/05/21   Roxan Hockey, MD  cholecalciferol (VITAMIN D) 25 MCG (1000 UNIT) tablet Take 1,000 Units by mouth daily. 11/07/20   [provider]  feeding supplement, ENSURE ENLIVE, (ENSURE ENLIVE) LIQD Take 237 mLs by mouth 2 (two) times daily between meals. 10/07/19   Manuella Ghazi, Pratik D, DO   ipratropium-albuterol (DUONEB) 0.5-2.5 (3) MG/3ML SOLN Take 3 mLs by nebulization every 6 (six) hours as needed (SOB). 03/05/21   Roxan Hockey, MD  levothyroxine (SYNTHROID) 125 MCG tablet Take 1 tablet (125 mcg total) by mouth every morning. Patient taking differently: Take 100 mcg by mouth every morning. 11/01/19   Gerlene Fee, NP  mirtazapine (REMERON) 15 MG tablet Take 15 mg by mouth at bedtime. 02/24/21   [provider]  montelukast (SINGULAIR) 10 MG tablet Take 1 tablet (10 mg total) by mouth at bedtime. 11/01/19   Gerlene Fee, NP  NON FORMULARY Diet: _____ Regular,  __x____ NAS,  _______Consistent Carbohydrate,  _______NPO  _____Other    [provider]  pantoprazole (PROTONIX) 40 MG tablet Take 40 mg by mouth daily. 02/24/21   [provider]  sertraline (ZOLOFT) 25 MG tablet Take 1 tablet (25 mg total) by mouth daily. 11/01/19   Gerlene Fee, NP  vitamin B-12 (CYANOCOBALAMIN) 1000 MCG tablet Take 1,000 mcg by mouth daily. 02/24/21   [provider]      Allergies    Patient has no known allergies.    Review of Systems   Review of Systems  Constitutional:  Negative for fever.  Respiratory:  Negative for shortness of breath.    Physical Exam Updated Vital Signs BP (!) 159/86    Pulse 68    Temp 97.6 F (  36.4 C) (Oral)    Resp 20    Ht 1.676 m (5\' 6" )    Wt 54.4 kg    SpO2 99%    BMI 19.37 kg/m  Physical Exam Vitals and nursing note reviewed.  Constitutional:      Appearance: Normal appearance. She is well-developed. She is not diaphoretic.     Comments: Elderly, frail  HENT:     Head: Normocephalic and atraumatic. No raccoon eyes or Battle's sign.     Right Ear: External ear normal.     Left Ear: External ear normal.  Eyes:     General: Lids are normal.        Right eye: No discharge.     Conjunctiva/sclera:     Right eye: No hemorrhage.    Left eye: No hemorrhage. Neck:     Trachea: No tracheal deviation.   Cardiovascular:     Rate and Rhythm: Normal rate and regular rhythm.     Heart sounds: Normal heart sounds.  Pulmonary:     Effort: Pulmonary effort is normal. No respiratory distress.     Breath sounds: Normal breath sounds. No stridor.  Chest:     Chest wall: Tenderness present. No deformity or crepitus.     Comments: Mild tenderness palpation right posterior rib region below the scapula, small contusion noted Abdominal:     General: Bowel sounds are normal. There is no distension.     Palpations: Abdomen is soft. There is no mass.     Tenderness: There is no abdominal tenderness.     Comments: Negative for seat belt sign  Musculoskeletal:     Right shoulder: Swelling and tenderness present. Decreased range of motion.     Right elbow: Normal.     Cervical back: No swelling, edema, deformity or tenderness. No spinous process tenderness.     Thoracic back: No swelling, deformity or tenderness.     Lumbar back: No swelling or tenderness.     Right hip: Normal.     Left hip: Normal.     Right lower leg: Laceration present.     Comments: Pelvis stable, no ttp; no tenderness palpation left upper extremity or left lower extremity; skin tear type laceration noted right lower extremity in the lateral mid calf region of the right lower leg  Neurological:     Mental Status: She is alert.     GCS: GCS eye subscore is 4. GCS verbal subscore is 5. GCS motor subscore is 6.     Sensory: No sensory deficit.     Motor: No abnormal muscle tone.     Comments: Able to move all extremities, sensation intact throughout  Psychiatric:        Speech: Speech normal.        Behavior: Behavior normal.    ED Results / Procedures / Treatments   Labs (all labs ordered are listed, but only abnormal results are displayed) Labs Reviewed - No data to display  EKG None  Radiology DG Ribs Unilateral W/Chest Right  Result Date: 08/26/2021 CLINICAL DATA:  Fall, right shoulder pain EXAM: RIGHT RIBS AND  CHEST - 3+ VIEW COMPARISON:  None. FINDINGS: Heart size is likely normal. The upper mediastinal contours are normal. There is no focal consolidation or pulmonary edema. There is no pleural effusion or pneumothorax. There is a large hiatal hernia. No displaced rib fracture or other acute osseous abnormality is seen. IMPRESSION: 1. No displaced rib fracture or other acute osseous abnormality identified. 2.  Large hiatal hernia. Electronically Signed   By: Valetta Mole M.D.   On: 08/26/2021 11:52   DG Thoracic Spine W/Swimmers  Result Date: 08/26/2021 CLINICAL DATA:  Pain post fall EXAM: THORACIC SPINE - 3 VIEWS COMPARISON:  CT angio chest 03/02/2021 FINDINGS: Twelve pairs of ribs. Osseous demineralization with levoconvex thoracic scoliosis. Prior lumbar fusion. Chronic compression deformities of T8 and L1 unchanged. No acute fracture, dislocation, or bone destruction. Large hiatal hernia. IMPRESSION: Osseous demineralization with chronic compression fractures of T8 and L1 as well as dextroconvex thoracic scoliosis. No acute osseous abnormalities. Large hiatal hernia. Electronically Signed   By: Lavonia Dana M.D.   On: 08/26/2021 11:53   DG Shoulder Right  Result Date: 08/26/2021 CLINICAL DATA:  Fall.  Right shoulder pain. EXAM: RIGHT SHOULDER - 2+ VIEW COMPARISON:  Report from 06/05/2017 right shoulder radiographs FINDINGS: No acute fracture or dislocation is identified. There is advanced glenohumeral osteoarthrosis with complete joint space loss, subchondral sclerosis, and spurring. There is also a high riding humeral head which contacts the undersurface of the acromion which is remodeled. There is mild calcification within the soft tissues about the shoulder. IMPRESSION: 1. No acute osseous abnormality identified. 2. Advanced glenohumeral osteoarthrosis. 3. Evidence of chronic rotator cuff tear. Electronically Signed   By: Logan Bores M.D.   On: 08/26/2021 11:51   DG Tibia/Fibula Right  Result Date:  08/26/2021 CLINICAL DATA:  Fall EXAM: RIGHT TIBIA AND FIBULA - 2 VIEW COMPARISON:  None FINDINGS: RIGHT knee prosthesis. Marked osseous demineralization. Ankle joint preserved. No acute fracture, dislocation, or bone destruction. Minimal scattered soft tissue swelling and vascular calcifications noted. IMPRESSION: No acute osseous abnormalities. Osseous demineralization with RIGHT knee prosthesis. Electronically Signed   By: Lavonia Dana M.D.   On: 08/26/2021 11:51    Procedures Procedures   Skin laceration examined: Superficial skin tear.  Superficial skin initially folded back on itself.  Skin edges unfolded and reapproximated Medications Ordered in ED Medications  acetaminophen (TYLENOL) tablet 650 mg (650 mg Oral Given 08/26/21 1116)    ED Course/ Medical Decision Making/ A&P Clinical Course as of 08/26/21 1258  Thu Aug 26, 2021  1251 X-ray images and report reviewed, no acute fractures noted in the thoracic spine ribs right shoulder or tib-fib.  Patient does have chronic compression fracture.  She does have significant arthritis in the right shoulder [JK]    Clinical Course User Index [JK] Dorie Rank, MD                           Medical Decision Making Amount and/or Complexity of Data Reviewed Radiology: ordered.  Risk OTC drugs.   Patient presented to the ED for evaluation of a mechanical fall.  Patient is at advanced age and has chronic gait instability requiring a walker.  X-rays fortunately do not show any signs of fracture.  Patient does have some chronic findings of osteoarthritis of the shoulder.  She also has an old compression fracture.  Patient did have a laceration of her lower leg.  This was a superficial type skin tear.  Wound edges were approximated.  Dressing was applied by nursing staff.  Discussed findings and plan with both the patient and her son.        Final Clinical Impression(s) / ED Diagnoses Final diagnoses:  Skin tear of lower leg without  complication, right, initial encounter  Arthritis of right shoulder region  Age related osteoporosis, unspecified pathological fracture presence  Rx / DC Orders ED Discharge Orders     None         Dorie Rank, MD 08/26/21 1258

## 2021-08-26 NOTE — ED Notes (Signed)
Patient transported to X-ray 

## 2021-08-26 NOTE — Discharge Instructions (Signed)
Take over-the-counter medications as needed for pain.  Apply antibiotic ointment to the leg wound daily and change the dressings daily.  Return to the ED as needed for fevers chills or other concerning symptoms

## 2021-08-26 NOTE — ED Triage Notes (Signed)
Family reports pt was walking with a walker last night and fell backwards. Reprots skin tear to R leg and pain in R shoulder.  Unsure if she hit her head but no loss of consciousness.

## 2021-08-26 NOTE — ED Notes (Signed)
Pt d/c home with son per MD order. This RN applied dressing to skin tear on right lower leg. Discharge summary reviewed, verbalize understanding. Off unit via WC. No s/s of acute distress noted at discharge,.

## 2021-08-26 NOTE — Progress Notes (Signed)
Rates at 7.

## 2021-10-18 DIAGNOSIS — E039 Hypothyroidism, unspecified: Secondary | ICD-10-CM | POA: Diagnosis not present

## 2021-10-18 DIAGNOSIS — Z6821 Body mass index (BMI) 21.0-21.9, adult: Secondary | ICD-10-CM | POA: Diagnosis not present

## 2021-10-18 DIAGNOSIS — Z0000 Encounter for general adult medical examination without abnormal findings: Secondary | ICD-10-CM | POA: Diagnosis not present

## 2021-10-18 DIAGNOSIS — I10 Essential (primary) hypertension: Secondary | ICD-10-CM | POA: Diagnosis not present

## 2021-10-18 DIAGNOSIS — J449 Chronic obstructive pulmonary disease, unspecified: Secondary | ICD-10-CM | POA: Diagnosis not present

## 2022-02-02 DIAGNOSIS — I10 Essential (primary) hypertension: Secondary | ICD-10-CM | POA: Diagnosis not present

## 2022-02-02 DIAGNOSIS — Z6822 Body mass index (BMI) 22.0-22.9, adult: Secondary | ICD-10-CM | POA: Diagnosis not present

## 2022-02-02 DIAGNOSIS — J440 Chronic obstructive pulmonary disease with acute lower respiratory infection: Secondary | ICD-10-CM | POA: Diagnosis not present

## 2022-02-02 DIAGNOSIS — E039 Hypothyroidism, unspecified: Secondary | ICD-10-CM | POA: Diagnosis not present

## 2022-03-04 DIAGNOSIS — R609 Edema, unspecified: Secondary | ICD-10-CM | POA: Diagnosis not present

## 2022-03-04 DIAGNOSIS — M7989 Other specified soft tissue disorders: Secondary | ICD-10-CM | POA: Diagnosis not present

## 2022-03-04 DIAGNOSIS — M25441 Effusion, right hand: Secondary | ICD-10-CM | POA: Diagnosis not present

## 2022-03-15 DIAGNOSIS — M79643 Pain in unspecified hand: Secondary | ICD-10-CM | POA: Diagnosis not present

## 2022-03-15 DIAGNOSIS — S63278A Dislocation of unspecified interphalangeal joint of other finger, initial encounter: Secondary | ICD-10-CM | POA: Diagnosis not present

## 2022-03-15 DIAGNOSIS — M19041 Primary osteoarthritis, right hand: Secondary | ICD-10-CM | POA: Diagnosis not present

## 2022-03-15 DIAGNOSIS — M7989 Other specified soft tissue disorders: Secondary | ICD-10-CM | POA: Diagnosis not present

## 2022-03-15 DIAGNOSIS — M1811 Unilateral primary osteoarthritis of first carpometacarpal joint, right hand: Secondary | ICD-10-CM | POA: Diagnosis not present

## 2022-03-15 DIAGNOSIS — M189 Osteoarthritis of first carpometacarpal joint, unspecified: Secondary | ICD-10-CM | POA: Diagnosis not present

## 2022-05-11 DIAGNOSIS — E039 Hypothyroidism, unspecified: Secondary | ICD-10-CM | POA: Diagnosis not present

## 2022-05-11 DIAGNOSIS — I10 Essential (primary) hypertension: Secondary | ICD-10-CM | POA: Diagnosis not present

## 2022-05-11 DIAGNOSIS — Z6823 Body mass index (BMI) 23.0-23.9, adult: Secondary | ICD-10-CM | POA: Diagnosis not present

## 2022-05-11 DIAGNOSIS — J440 Chronic obstructive pulmonary disease with acute lower respiratory infection: Secondary | ICD-10-CM | POA: Diagnosis not present

## 2022-07-20 DIAGNOSIS — J40 Bronchitis, not specified as acute or chronic: Secondary | ICD-10-CM | POA: Diagnosis not present

## 2022-07-20 DIAGNOSIS — I10 Essential (primary) hypertension: Secondary | ICD-10-CM | POA: Diagnosis not present

## 2022-07-20 DIAGNOSIS — N182 Chronic kidney disease, stage 2 (mild): Secondary | ICD-10-CM | POA: Diagnosis not present

## 2022-07-20 DIAGNOSIS — N3000 Acute cystitis without hematuria: Secondary | ICD-10-CM | POA: Diagnosis not present

## 2022-07-20 DIAGNOSIS — J449 Chronic obstructive pulmonary disease, unspecified: Secondary | ICD-10-CM | POA: Diagnosis not present

## 2022-07-20 DIAGNOSIS — E039 Hypothyroidism, unspecified: Secondary | ICD-10-CM | POA: Diagnosis not present

## 2022-07-20 DIAGNOSIS — Z6823 Body mass index (BMI) 23.0-23.9, adult: Secondary | ICD-10-CM | POA: Diagnosis not present

## 2022-09-08 ENCOUNTER — Inpatient Hospital Stay (HOSPITAL_COMMUNITY)
Admission: EM | Admit: 2022-09-08 | Discharge: 2022-09-09 | DRG: 536 | Disposition: A | Discharge status: Skilled Nursing Facility | Payer: Medicare Other | Attending: Internal Medicine | Admitting: Internal Medicine

## 2022-09-08 ENCOUNTER — Encounter (HOSPITAL_COMMUNITY): Payer: Self-pay

## 2022-09-08 ENCOUNTER — Other Ambulatory Visit: Payer: Self-pay

## 2022-09-08 ENCOUNTER — Emergency Department (HOSPITAL_COMMUNITY): Payer: Medicare Other

## 2022-09-08 DIAGNOSIS — J4489 Other specified chronic obstructive pulmonary disease: Secondary | ICD-10-CM | POA: Diagnosis not present

## 2022-09-08 DIAGNOSIS — Z7951 Long term (current) use of inhaled steroids: Secondary | ICD-10-CM

## 2022-09-08 DIAGNOSIS — Z79899 Other long term (current) drug therapy: Secondary | ICD-10-CM

## 2022-09-08 DIAGNOSIS — I10 Essential (primary) hypertension: Secondary | ICD-10-CM | POA: Diagnosis present

## 2022-09-08 DIAGNOSIS — H548 Legal blindness, as defined in USA: Secondary | ICD-10-CM | POA: Diagnosis present

## 2022-09-08 DIAGNOSIS — Z7952 Long term (current) use of systemic steroids: Secondary | ICD-10-CM

## 2022-09-08 DIAGNOSIS — N1831 Chronic kidney disease, stage 3a: Secondary | ICD-10-CM | POA: Diagnosis present

## 2022-09-08 DIAGNOSIS — W19XXXA Unspecified fall, initial encounter: Principal | ICD-10-CM

## 2022-09-08 DIAGNOSIS — M549 Dorsalgia, unspecified: Secondary | ICD-10-CM | POA: Diagnosis not present

## 2022-09-08 DIAGNOSIS — Z1152 Encounter for screening for COVID-19: Secondary | ICD-10-CM

## 2022-09-08 DIAGNOSIS — R262 Difficulty in walking, not elsewhere classified: Secondary | ICD-10-CM | POA: Diagnosis not present

## 2022-09-08 DIAGNOSIS — S32810A Multiple fractures of pelvis with stable disruption of pelvic ring, initial encounter for closed fracture: Secondary | ICD-10-CM | POA: Diagnosis not present

## 2022-09-08 DIAGNOSIS — S329XXA Fracture of unspecified parts of lumbosacral spine and pelvis, initial encounter for closed fracture: Secondary | ICD-10-CM | POA: Diagnosis not present

## 2022-09-08 DIAGNOSIS — I130 Hypertensive heart and chronic kidney disease with heart failure and stage 1 through stage 4 chronic kidney disease, or unspecified chronic kidney disease: Secondary | ICD-10-CM | POA: Diagnosis present

## 2022-09-08 DIAGNOSIS — K5730 Diverticulosis of large intestine without perforation or abscess without bleeding: Secondary | ICD-10-CM | POA: Diagnosis not present

## 2022-09-08 DIAGNOSIS — I493 Ventricular premature depolarization: Secondary | ICD-10-CM | POA: Diagnosis not present

## 2022-09-08 DIAGNOSIS — E039 Hypothyroidism, unspecified: Secondary | ICD-10-CM | POA: Diagnosis not present

## 2022-09-08 DIAGNOSIS — I5021 Acute systolic (congestive) heart failure: Secondary | ICD-10-CM | POA: Diagnosis present

## 2022-09-08 DIAGNOSIS — K449 Diaphragmatic hernia without obstruction or gangrene: Secondary | ICD-10-CM | POA: Diagnosis not present

## 2022-09-08 DIAGNOSIS — I5032 Chronic diastolic (congestive) heart failure: Secondary | ICD-10-CM | POA: Diagnosis present

## 2022-09-08 DIAGNOSIS — Z79890 Hormone replacement therapy (postmenopausal): Secondary | ICD-10-CM

## 2022-09-08 DIAGNOSIS — M1990 Unspecified osteoarthritis, unspecified site: Secondary | ICD-10-CM | POA: Diagnosis present

## 2022-09-08 DIAGNOSIS — Z90710 Acquired absence of both cervix and uterus: Secondary | ICD-10-CM

## 2022-09-08 DIAGNOSIS — Z96653 Presence of artificial knee joint, bilateral: Secondary | ICD-10-CM | POA: Diagnosis present

## 2022-09-08 DIAGNOSIS — S32502A Unspecified fracture of left pubis, initial encounter for closed fracture: Secondary | ICD-10-CM | POA: Diagnosis not present

## 2022-09-08 DIAGNOSIS — S32592A Other specified fracture of left pubis, initial encounter for closed fracture: Secondary | ICD-10-CM | POA: Diagnosis not present

## 2022-09-08 DIAGNOSIS — S32692A Other specified fracture of left ischium, initial encounter for closed fracture: Secondary | ICD-10-CM | POA: Diagnosis not present

## 2022-09-08 DIAGNOSIS — K219 Gastro-esophageal reflux disease without esophagitis: Secondary | ICD-10-CM | POA: Diagnosis present

## 2022-09-08 DIAGNOSIS — Z66 Do not resuscitate: Secondary | ICD-10-CM | POA: Diagnosis present

## 2022-09-08 DIAGNOSIS — S32602D Unspecified fracture of left ischium, subsequent encounter for fracture with routine healing: Secondary | ICD-10-CM | POA: Diagnosis not present

## 2022-09-08 DIAGNOSIS — E785 Hyperlipidemia, unspecified: Secondary | ICD-10-CM | POA: Diagnosis not present

## 2022-09-08 DIAGNOSIS — J4540 Moderate persistent asthma, uncomplicated: Secondary | ICD-10-CM | POA: Diagnosis not present

## 2022-09-08 DIAGNOSIS — S32502D Unspecified fracture of left pubis, subsequent encounter for fracture with routine healing: Secondary | ICD-10-CM | POA: Diagnosis not present

## 2022-09-08 DIAGNOSIS — G8929 Other chronic pain: Secondary | ICD-10-CM | POA: Diagnosis not present

## 2022-09-08 DIAGNOSIS — W1830XA Fall on same level, unspecified, initial encounter: Secondary | ICD-10-CM | POA: Diagnosis present

## 2022-09-08 DIAGNOSIS — M810 Age-related osteoporosis without current pathological fracture: Secondary | ICD-10-CM | POA: Diagnosis not present

## 2022-09-08 DIAGNOSIS — S7700XA Crushing injury of unspecified hip, initial encounter: Secondary | ICD-10-CM | POA: Diagnosis not present

## 2022-09-08 DIAGNOSIS — R52 Pain, unspecified: Secondary | ICD-10-CM | POA: Diagnosis not present

## 2022-09-08 DIAGNOSIS — E890 Postprocedural hypothyroidism: Secondary | ICD-10-CM | POA: Diagnosis not present

## 2022-09-08 DIAGNOSIS — F339 Major depressive disorder, recurrent, unspecified: Secondary | ICD-10-CM | POA: Diagnosis present

## 2022-09-08 DIAGNOSIS — S32512A Fracture of superior rim of left pubis, initial encounter for closed fracture: Secondary | ICD-10-CM | POA: Diagnosis not present

## 2022-09-08 DIAGNOSIS — M1611 Unilateral primary osteoarthritis, right hip: Secondary | ICD-10-CM | POA: Diagnosis not present

## 2022-09-08 DIAGNOSIS — S3282XA Multiple fractures of pelvis without disruption of pelvic ring, initial encounter for closed fracture: Secondary | ICD-10-CM | POA: Diagnosis not present

## 2022-09-08 DIAGNOSIS — Z743 Need for continuous supervision: Secondary | ICD-10-CM | POA: Diagnosis not present

## 2022-09-08 DIAGNOSIS — R488 Other symbolic dysfunctions: Secondary | ICD-10-CM | POA: Diagnosis not present

## 2022-09-08 DIAGNOSIS — M6281 Muscle weakness (generalized): Secondary | ICD-10-CM | POA: Diagnosis not present

## 2022-09-08 DIAGNOSIS — M159 Polyosteoarthritis, unspecified: Secondary | ICD-10-CM | POA: Diagnosis not present

## 2022-09-08 DIAGNOSIS — S32602A Unspecified fracture of left ischium, initial encounter for closed fracture: Secondary | ICD-10-CM | POA: Diagnosis not present

## 2022-09-08 DIAGNOSIS — R2681 Unsteadiness on feet: Secondary | ICD-10-CM | POA: Diagnosis not present

## 2022-09-08 LAB — BASIC METABOLIC PANEL
Anion gap: 10 (ref 5–15)
BUN: 16 mg/dL (ref 8–23)
CO2: 25 mmol/L (ref 22–32)
Calcium: 9.5 mg/dL (ref 8.9–10.3)
Chloride: 100 mmol/L (ref 98–111)
Creatinine, Ser: 0.87 mg/dL (ref 0.44–1.00)
GFR, Estimated: >60 mL/min (ref >60)
Glucose, Bld: 120 mg/dL — ABNORMAL HIGH (ref 70–99)
Potassium: 3.7 mmol/L (ref 3.5–5.1)
Sodium: 135 mmol/L (ref 135–145)

## 2022-09-08 LAB — CBC
HCT: 37.3 % (ref 36.0–46.0)
Hemoglobin: 12 g/dL (ref 12.0–15.0)
MCH: 31.2 pg (ref 26.0–34.0)
MCHC: 32.2 g/dL (ref 30.0–36.0)
MCV: 96.9 fL (ref 80.0–100.0)
Platelets: 324 10*3/uL (ref 150–400)
RBC: 3.85 MIL/uL — ABNORMAL LOW (ref 3.87–5.11)
RDW: 13.3 % (ref 11.5–15.5)
WBC: 11.8 10*3/uL — ABNORMAL HIGH (ref 4.0–10.5)
nRBC: 0 % (ref 0.0–0.2)

## 2022-09-08 MED ORDER — ONDANSETRON HCL 4 MG PO TABS: 4.0000 mg | ORAL_TABLET | Freq: Four times a day (QID) | ORAL | Status: DC | PRN

## 2022-09-08 MED ORDER — ENOXAPARIN SODIUM 40 MG/0.4ML IJ SOSY
40.0000 mg | PREFILLED_SYRINGE | INTRAMUSCULAR | Status: DC
  Administered 2022-09-08: 40 mg via SUBCUTANEOUS
  Filled 2022-09-08: qty 0.4

## 2022-09-08 MED ORDER — IPRATROPIUM-ALBUTEROL 0.5-2.5 (3) MG/3ML IN SOLN: 3.0000 mL | Freq: Four times a day (QID) | RESPIRATORY_TRACT | Status: DC | PRN

## 2022-09-08 MED ORDER — SERTRALINE HCL 50 MG PO TABS
50.0000 mg | ORAL_TABLET | Freq: Every day | ORAL | Status: DC
  Administered 2022-09-09: 50 mg via ORAL
  Filled 2022-09-08: qty 1

## 2022-09-08 MED ORDER — LEVOTHYROXINE SODIUM 100 MCG PO TABS
100.0000 ug | ORAL_TABLET | Freq: Every day | ORAL | Status: DC
  Administered 2022-09-09: 100 ug via ORAL
  Filled 2022-09-08: qty 1

## 2022-09-08 MED ORDER — VITAMIN D 25 MCG (1000 UNIT) PO TABS
1000.0000 [IU] | ORAL_TABLET | Freq: Every day | ORAL | Status: DC
  Administered 2022-09-09: 1000 [IU] via ORAL
  Filled 2022-09-08: qty 1

## 2022-09-08 MED ORDER — ONDANSETRON HCL 4 MG/2ML IJ SOLN: 4.0000 mg | Freq: Four times a day (QID) | INTRAMUSCULAR | Status: DC | PRN

## 2022-09-08 MED ORDER — IOHEXOL 300 MG/ML  SOLN
75.0000 mL | Freq: Once | INTRAMUSCULAR | Status: AC | PRN
Start: 2022-09-08 — End: 2022-09-08
  Administered 2022-09-08: 75 mL via INTRAVENOUS

## 2022-09-08 MED ORDER — PANTOPRAZOLE SODIUM 40 MG PO TBEC
40.0000 mg | DELAYED_RELEASE_TABLET | Freq: Every day | ORAL | Status: DC
  Administered 2022-09-09: 40 mg via ORAL
  Filled 2022-09-08: qty 1

## 2022-09-08 MED ORDER — PREDNISONE 10 MG PO TABS
5.0000 mg | ORAL_TABLET | Freq: Every day | ORAL | Status: DC
  Administered 2022-09-09: 5 mg via ORAL
  Filled 2022-09-08: qty 1

## 2022-09-08 MED ORDER — ACETAMINOPHEN 650 MG RE SUPP: 650.0000 mg | Freq: Four times a day (QID) | RECTAL | Status: DC | PRN

## 2022-09-08 MED ORDER — FENTANYL CITRATE PF 50 MCG/ML IJ SOSY
50.0000 ug | PREFILLED_SYRINGE | Freq: Once | INTRAMUSCULAR | Status: AC
  Administered 2022-09-08: 50 ug via INTRAVENOUS
  Filled 2022-09-08: qty 1

## 2022-09-08 MED ORDER — MONTELUKAST SODIUM 10 MG PO TABS: 10.0000 mg | ORAL_TABLET | Freq: Every day | ORAL | Status: DC

## 2022-09-08 MED ORDER — VITAMIN B-12 100 MCG PO TABS
500.0000 ug | ORAL_TABLET | Freq: Every day | ORAL | Status: DC
  Administered 2022-09-09: 500 ug via ORAL
  Filled 2022-09-08: qty 5

## 2022-09-08 MED ORDER — BUDESONIDE 0.5 MG/2ML IN SUSP
0.5000 mg | Freq: Two times a day (BID) | RESPIRATORY_TRACT | Status: DC
  Administered 2022-09-09: 0.5 mg via RESPIRATORY_TRACT
  Filled 2022-09-08 (×2): qty 2

## 2022-09-08 MED ORDER — OXYCODONE HCL 5 MG PO TABS
5.0000 mg | ORAL_TABLET | Freq: Four times a day (QID) | ORAL | Status: DC | PRN
  Administered 2022-09-09 (×2): 5 mg via ORAL
  Filled 2022-09-08 (×2): qty 1

## 2022-09-08 MED ORDER — TIOTROPIUM BROMIDE MONOHYDRATE 18 MCG IN CAPS: 1.0000 | ORAL_CAPSULE | Freq: Every day | RESPIRATORY_TRACT | Status: DC

## 2022-09-08 MED ORDER — ALBUTEROL SULFATE (2.5 MG/3ML) 0.083% IN NEBU
2.5000 mg | INHALATION_SOLUTION | Freq: Four times a day (QID) | RESPIRATORY_TRACT | Status: DC
  Administered 2022-09-09 (×3): 2.5 mg via RESPIRATORY_TRACT
  Filled 2022-09-08 (×4): qty 3

## 2022-09-08 MED ORDER — SENNA 8.6 MG PO TABS
1.0000 | ORAL_TABLET | Freq: Two times a day (BID) | ORAL | Status: DC
  Administered 2022-09-08 – 2022-09-09 (×2): 8.6 mg via ORAL
  Filled 2022-09-08 (×2): qty 1

## 2022-09-08 MED ORDER — ACETAMINOPHEN 325 MG PO TABS: 650.0000 mg | ORAL_TABLET | Freq: Four times a day (QID) | ORAL | Status: DC | PRN

## 2022-09-08 MED ORDER — HYDRALAZINE HCL 20 MG/ML IJ SOLN: 5.0000 mg | INTRAMUSCULAR | Status: DC | PRN

## 2022-09-08 MED ORDER — UMECLIDINIUM BROMIDE 62.5 MCG/ACT IN AEPB
1.0000 | INHALATION_SPRAY | Freq: Every day | RESPIRATORY_TRACT | Status: DC
  Administered 2022-09-09: 1 via RESPIRATORY_TRACT
  Filled 2022-09-08: qty 7

## 2022-09-08 MED ORDER — MIRTAZAPINE 15 MG PO TABS: 15.0000 mg | ORAL_TABLET | Freq: Every day | ORAL | Status: DC

## 2022-09-08 MED ORDER — ENSURE ENLIVE PO LIQD
237.0000 mL | Freq: Two times a day (BID) | ORAL | Status: DC
  Administered 2022-09-09 (×2): 237 mL via ORAL

## 2022-09-08 MED ORDER — ROSUVASTATIN CALCIUM 10 MG PO TABS
10.0000 mg | ORAL_TABLET | Freq: Every day | ORAL | Status: DC
  Administered 2022-09-08: 10 mg via ORAL
  Filled 2022-09-08: qty 1

## 2022-09-08 MED ORDER — POLYETHYLENE GLYCOL 3350 17 G PO PACK: 17.0000 g | PACK | Freq: Every day | ORAL | Status: DC | PRN

## 2022-09-08 MED ORDER — LOSARTAN POTASSIUM 50 MG PO TABS
50.0000 mg | ORAL_TABLET | Freq: Every day | ORAL | Status: DC
  Administered 2022-09-09: 50 mg via ORAL
  Filled 2022-09-08: qty 1

## 2022-09-08 MED ORDER — MORPHINE SULFATE (PF) 2 MG/ML IV SOLN
2.0000 mg | INTRAVENOUS | Status: DC | PRN
  Administered 2022-09-08: 2 mg via INTRAVENOUS
  Filled 2022-09-08: qty 1

## 2022-09-08 MED ORDER — METHOCARBAMOL 1000 MG/10ML IJ SOLN: 500.0000 mg | Freq: Four times a day (QID) | INTRAVENOUS | Status: DC | PRN

## 2022-09-08 NOTE — ED Triage Notes (Signed)
Patient was attempting to get up from her chair in her bed room and fell to the ground landing on her left hip. Patient denies LOC. She only complains of left hip pain 7/10

## 2022-09-08 NOTE — H&P (Addendum)
[facility] H&P   Patient Demographics:    Lindsey Butler, is a 89 y.o. female  MRN: [id number]   DOB - 11-13-1928  Admit Date - 09/08/2022  Outpatient Primary MD for the patient is Neale Burly, MD  Referring MD/NP/PA: Dr Alvino Chapel  Patient coming from: home  Chief Complaint  Patient presents with   Fall    Patient fell while getting out of her chair. Patient denies LOC. She is complaining of left hip pain 7/10      HPI:    Lindsey Butler  is a 89 y.o. female, past medical history of hypertension, diastolic CHF, kidney disease, generalized weakness, asthma, GERD, hyperlipidemia, hypothyroidism, osteoarthritis, chronic back pain. -She presents to ED secondary to fall, patient with very poor vision (legally blind per family at bedside), she was attempting to get up from a chair, and fell landing on her left hip, she was complaining of left hip, she denies any dizziness, lightheadedness, denies hitting her head, complains of hip/pelvic pain, denies dysuria, fever or chills, EMS were called, she has history of hip fracture in the past which operated by Dr. Aline Brochure. -In ED CT pelvis was significant for pelvic fracture, no significant labs abnormalities, she required multiple doses of fentanyl while in ED, so [facility] hospitalist consulted to admit.    Review of systems:     A full 10 point Review of Systems was done, except as stated above, all other Review of Systems were negative.   With Past History of the following :    Past Medical History:  Diagnosis Date   Actinic keratosis    Asthma    GERD (gastroesophageal reflux disease)    Hyperlipidemia    Hypothyroidism    Osteoarthritis       Past Surgical History:  Procedure Laterality Date   CATARACT EXTRACTION     left and right   HIP ARTHROPLASTY Left 10/04/2019   Procedure: ARTHROPLASTY BIPOLAR HIP (HEMIARTHROPLASTY);   Surgeon: Carole Civil, MD;  Location: [facility];  Service: Orthopedics;  Laterality: Left;   THYROIDECTOMY     TONSILLECTOMY     TOTAL KNEE ARTHROPLASTY     right and left knee   VAGINAL HYSTERECTOMY        Social History:     Social History   Tobacco Use   Smoking status: Never   Smokeless tobacco: Never  Substance Use Topics   Alcohol use: No     Lives -home by herself but multiple family member doing round-the-clock monitoring for her  Mobility -with a Lindsey at baseline    Family History :     Family History  Problem Relation Age of Onset   Cancer Son    Liver cancer Brother      Home Medications:   Prior to Admission medications   Medication Sig Start Date End Date Taking? Authorizing Provider  acetaminophen (TYLENOL) 650  MG CR tablet Take 650 mg by mouth every 6 (six) hours as needed for pain.    [provider]  acetaZOLAMIDE (DIAMOX) 250 MG tablet Take 1 tablet (250 mg total) by mouth 2 (two) times daily. 11/01/19   Gerlene Fee, NP  albuterol (VENTOLIN HFA) 108 (90 Base) MCG/ACT inhaler Inhale 2 puffs into the lungs every 4 (four) hours as needed for wheezing or shortness of breath. 03/05/21   Emokpae, Courage, MD  budesonide (PULMICORT) 0.5 MG/2ML nebulizer solution Take 2 mLs (0.5 mg total) by nebulization 2 (two) times daily. 03/05/21   Roxan Hockey, MD  cholecalciferol (VITAMIN D) 25 MCG (1000 UNIT) tablet Take 1,000 Units by mouth daily. 11/07/20   [provider]  feeding supplement, ENSURE ENLIVE, (ENSURE ENLIVE) LIQD Take 237 mLs by mouth 2 (two) times daily between meals. 10/07/19   Manuella Ghazi, Pratik D, DO  ipratropium-albuterol (DUONEB) 0.5-2.5 (3) MG/3ML SOLN Take 3 mLs by nebulization every 6 (six) hours as needed (SOB). 03/05/21   Roxan Hockey, MD  levothyroxine (SYNTHROID) 125 MCG tablet Take 1 tablet (125 mcg total) by mouth every morning. Patient taking differently: Take 100 mcg by mouth every morning. 11/01/19   Gerlene Fee, NP  mirtazapine (REMERON) 15 MG tablet Take 15 mg by mouth at bedtime. 02/24/21   [provider]  montelukast (SINGULAIR) 10 MG tablet Take 1 tablet (10 mg total) by mouth at bedtime. 11/01/19   Gerlene Fee, NP  NON FORMULARY Diet: _____ Regular,  __x____ NAS,  _______Consistent Carbohydrate,  _______NPO  _____Other    [provider]  pantoprazole (PROTONIX) 40 MG tablet Take 40 mg by mouth daily. 02/24/21   [provider]  sertraline (ZOLOFT) 25 MG tablet Take 1 tablet (25 mg total) by mouth daily. 11/01/19   Gerlene Fee, NP  vitamin B-12 (CYANOCOBALAMIN) 1000 MCG tablet Take 1,000 mcg by mouth daily. 02/24/21   [provider]     Allergies:    No Known Allergies   Physical Exam:   Vitals  Blood pressure (!) 143/74, pulse 85, temperature (!) 97.4 F (36.3 C), temperature source Oral, resp. rate 16, height '5\' 7"'$  (1.702 m), weight 59.9 kg, SpO2 97 %.   1. General thin, frail elderly female, laying in bed, no apparent distress  2. Normal affect and insight, Not Suicidal or Homicidal, Awake Alert, Oriented X 3.  3. No F.N deficits, ALL C.Nerves Intact, Strength 5/5 all 4 extremities, Sensation intact all 4 extremities, Plantars down going.  4.  Patient is legally blind, Conjunctivae clear. Moist Oral Mucosa.  5. Supple Neck, No JVD, No cervical lymphadenopathy appriciated, No Carotid Bruits.  6. Symmetrical Chest wall movement, Good air movement bilaterally, CTAB.  7. RRR, No Gallops, Rubs or Murmurs, No Parasternal Heave.  8. Positive Bowel Sounds, Abdomen Soft, No tenderness, No organomegaly appriciated,No rebound -guarding or rigidity.  9.  No Cyanosis, Normal Skin Turgor, he appears to be having multiple skin bruises on right lower extremity due to previous falls  10. Good muscle tone,  joints appear normal , no effusions     Data Review:    CBC Recent Labs  Lab 09/08/22 1843  WBC 11.8*  HGB 12.0  HCT 37.3   PLT 324  MCV 96.9  MCH 31.2  MCHC 32.2  RDW 13.3   ------------------------------------------------------------------------------------------------------------------  Chemistries  Recent Labs  Lab 09/08/22 1843  NA 135  K 3.7  CL 100  CO2 25  GLUCOSE 120*  BUN 16  CREATININE 0.87  CALCIUM 9.5   ------------------------------------------------------------------------------------------------------------------ estimated creatinine clearance is 39.8 mL/min (by C-G formula based on SCr of 0.87 mg/dL). ------------------------------------------------------------------------------------------------------------------ No results for input(s): "TSH", "T4TOTAL", "T3FREE", "THYROIDAB" in the last 72 hours.  Invalid input(s): "FREET3"  Coagulation profile No results for input(s): "INR", "PROTIME" in the last 168 hours. ------------------------------------------------------------------------------------------------------------------- No results for input(s): "DDIMER" in the last 72 hours. -------------------------------------------------------------------------------------------------------------------  Cardiac Enzymes No results for input(s): "CKMB", "TROPONINI", "MYOGLOBIN" in the last 168 hours.  Invalid input(s): "CK" ------------------------------------------------------------------------------------------------------------------    Component Value Date/Time   BNP 265.0 (H) 03/02/2021 1409     ---------------------------------------------------------------------------------------------------------------  Urinalysis    Component Value Date/Time   COLORURINE YELLOW 03/02/2021 1539   APPEARANCEUR CLEAR 03/02/2021 1539   LABSPEC 1.026 03/02/2021 1539   PHURINE 6.0 03/02/2021 1539   GLUCOSEU NEGATIVE 03/02/2021 1539   HGBUR SMALL (A) 03/02/2021 1539   BILIRUBINUR NEGATIVE 03/02/2021 1539   KETONESUR NEGATIVE 03/02/2021 1539   PROTEINUR NEGATIVE 03/02/2021 1539   NITRITE  POSITIVE (A) 03/02/2021 1539   LEUKOCYTESUR LARGE (A) 03/02/2021 1539    ----------------------------------------------------------------------------------------------------------------   Imaging Results:    CT PELVIS W CONTRAST  Result Date: 09/08/2022 CLINICAL DATA:  Pelvic fracture EXAM: CT PELVIS WITH CONTRAST TECHNIQUE: Multidetector CT imaging of the pelvis was performed using the standard protocol following the bolus administration of intravenous contrast. RADIATION DOSE REDUCTION: This exam was performed according to the departmental dose-optimization program which includes automated exposure control, adjustment of the mA and/or kV according to patient size and/or use of iterative reconstruction technique. CONTRAST:  73m OMNIPAQUE IOHEXOL 300 MG/ML  SOLN COMPARISON:  Left hip x-ray same day FINDINGS: Urinary Tract:  No abnormality visualized. Bowel:  There is sigmoid colon diverticulosis.  No acute findings. Vascular/Lymphatic: There are atherosclerotic calcifications of the aorta and iliac arteries. No enlarged lymph nodes are seen. Reproductive:  Uterus is not visualized. Other:  There is no pelvic free fluid or abdominal wall hernia. Musculoskeletal: The bones are diffusely osteopenic. There is an acute comminuted fracture involving the left inferior pubic ramus, nondisplaced. There is also an acute transverse fracture through the posteroinferior aspect of the left ischium with fracture fragments separated 12 mm. There is soft tissue hematoma surrounding the fracture sites. There is no dislocation. The sacroiliac joints appear intact. Left hip arthroplasty appears uncomplicated. Moderate degenerative changes of the right hip and pubic symphysis as well as lower lumbar spine. Orthopedic hardware is partially visualized in the L4 vertebral body. IMPRESSION: 1. Acute comminuted fracture of the left inferior pubic ramus. 2. Acute transverse fracture through the posteroinferior aspect of the left  ischium. Aortic Atherosclerosis (ICD10-I70.0). Electronically Signed   By: ARonney AstersM.D.   On: 09/08/2022 20:47   DG Hip Unilat W or Wo Pelvis 2-3 Views Left  Result Date: 09/08/2022 CLINICAL DATA:  Fall,  left hip pain EXAM: DG HIP (WITH OR WITHOUT PELVIS) 2-3V LEFT COMPARISON:  None Available. FINDINGS: There is displaced fracture of the left inferior pubic ramus and ischial tuberosity. Status post left hip arthroplasty without evidence of acute periprosthetic fracture or dislocation. Moderate-to-severe right hip osteoarthritis osteopenia and lumbar spine hardware is again noted IMPRESSION: 1. Displaced fracture of the left inferior pubic ramus and ischial tuberosity. 2. Status post left hip arthroplasty without evidence of periprosthetic fracture or dislocation. Electronically Signed   By: IKeane PoliceD.O.   On: 09/08/2022 18:38       Assessment & Plan:    Principal Problem:  Pelvic fracture (HCC) Active Problems:   Essential hypertension   Major depression, recurrent, chronic (HCC)   Acute systolic heart failure (HCC)   Hypothyroidism  Pelvic  fracture -Secondary to mechanical fall, ED physician discussed with orthopedic on-call, recommendation for weightbearing as tolerated and pain control. -He required fentanyl x 2 in ED, so she will be kept on as needed Tylenol, and oxycodone for moderate pain, and morphine for severe pain -Will consult PT/OT -Will place Ortho consult in epic.  History of Asthma -No wheezing, continue home medication Pulmicort, Singulair, and as needed nebs -continue  with home dose prednisone  Hypertension - Blood pressure acceptable,  - as needed hydralazine  -Continue with home medications  History of depression -Continue with home medication of SSRI  Hiatal hernia with GERD -Continue with PPI  Hypothyroidism -Continue with Synthroid  History of chronic diastolic CHF -She appears to be a euvolemic at time of discharge, most recent echo in  August 2021, with a EF 65 to 70% with grade 1 diastolic CHF  DVT Prophylaxis   Lovenox   AM Labs Ordered, also please review Full Orders  Family Communication: Admission, patients condition and plan of care including tests being ordered have been discussed with the patient and Daughter in law who indicate understanding and agree with the plan and Code Status.  Code Status DNR, confirmed by daughter in law at bedside  Likely DC to  home vs SNF  Condition GUARDED    Consults called: ortho requested in EPIC    Admission status: observation    Time spent in minutes : 40 minutes   Phillips Climes M.D on 09/08/2022 at 9:42 PM   [facility] - Office  [phone]

## 2022-09-08 NOTE — ED Provider Notes (Signed)
[facility] Provider Note   CSN: [id number] Arrival date & time: 09/08/22  [place]     History  Chief Complaint  Patient presents with   Fall    Patient fell while getting out of her chair. Patient denies LOC. She is complaining of left hip pain 7/10    Lindsey Butler is a 89 y.o. female.   Fall  Patient presents with fall.  Was attempting get up from a chair and fell landing on her left hip.  Complaining of left hip pain.  No other injury.  States she did not hit her head.  Has not attempted to ambulate yet.  Not on blood thinners.  Left hip previously operated on by Dr. Aline Brochure for a hip replacement.    Past Medical History:  Diagnosis Date   Actinic keratosis    Asthma    GERD (gastroesophageal reflux disease)    Hyperlipidemia    Hypothyroidism    Osteoarthritis    Past Surgical History:  Procedure Laterality Date   CATARACT EXTRACTION     left and right   HIP ARTHROPLASTY Left 10/04/2019   Procedure: ARTHROPLASTY BIPOLAR HIP (HEMIARTHROPLASTY);  Surgeon: Carole Civil, MD;  Location: [facility];  Service: Orthopedics;  Laterality: Left;   THYROIDECTOMY     TONSILLECTOMY     TOTAL KNEE ARTHROPLASTY     right and left knee   VAGINAL HYSTERECTOMY      Home Medications Prior to Admission medications   Medication Sig Start Date End Date Taking? Authorizing Provider  acetaminophen (TYLENOL) 650 MG CR tablet Take 650 mg by mouth every 6 (six) hours as needed for pain.   Yes [provider]  albuterol (VENTOLIN HFA) 108 (90 Base) MCG/ACT inhaler Inhale 2 puffs into the lungs every 4 (four) hours as needed for wheezing or shortness of breath. 03/05/21  Yes Emokpae, Courage, MD  budesonide (PULMICORT) 0.5 MG/2ML nebulizer solution Take 2 mLs (0.5 mg total) by nebulization 2 (two) times daily. 03/05/21  Yes Emokpae, Courage, MD  cholecalciferol (VITAMIN D) 25 MCG (1000 UNIT) tablet Take 1,000 Units by mouth daily. 11/07/20   Yes [provider]  diclofenac Sodium (VOLTAREN) 1 % GEL Apply 2 g topically 4 (four) times daily. 03/17/22  Yes [provider]  furosemide (LASIX) 20 MG tablet Take 20 mg by mouth daily. 08/19/22  Yes [provider]  ipratropium-albuterol (DUONEB) 0.5-2.5 (3) MG/3ML SOLN Take 3 mLs by nebulization every 6 (six) hours as needed (SOB). 03/05/21  Yes Emokpae, Courage, MD  levothyroxine (SYNTHROID) 100 MCG tablet Take 100 mcg by mouth daily. 08/19/22  Yes [provider]  losartan (COZAAR) 50 MG tablet Take 50 mg by mouth daily. 08/19/22  Yes [provider]  montelukast (SINGULAIR) 10 MG tablet Take 1 tablet (10 mg total) by mouth at bedtime. 11/01/19  Yes Gerlene Fee, NP  pantoprazole (PROTONIX) 40 MG tablet Take 40 mg by mouth daily. 02/24/21  Yes [provider]  predniSONE (DELTASONE) 5 MG tablet Take 5 mg by mouth daily. 08/19/22  Yes [provider]  rosuvastatin (CRESTOR) 10 MG tablet Take 10 mg by mouth at bedtime. 08/19/22  Yes [provider]  sertraline (ZOLOFT) 50 MG tablet Take 50 mg by mouth daily. 08/19/22  Yes [provider]  SPIRIVA HANDIHALER 18 MCG inhalation capsule 1 capsule daily. 08/19/22  Yes [provider]  TRELEGY ELLIPTA 100-62.5-25 MCG/ACT AEPB Inhale 1 puff into the lungs  daily. 08/19/22  Yes [provider]  vitamin B-12 (CYANOCOBALAMIN) 1000 MCG tablet Take 1,000 mcg by mouth daily. 02/24/21  Yes [provider]  NON FORMULARY Diet: _____ Regular,  __x____ NAS,  _______Consistent Carbohydrate,  _______NPO  _____Other    [provider]      Allergies    Patient has no known allergies.    Review of Systems   Review of Systems  Physical Exam Updated Vital Signs BP 128/73 (BP Location: Right Arm)   Pulse 90   Temp 97.8 F (36.6 C) (Oral)   Resp 16   Ht '5\' 7"'$  (1.702 m)   Wt 59.9 kg   SpO2 97%   BMI 20.67 kg/m  Physical Exam Vitals and  nursing note reviewed.  Cardiovascular:     Rate and Rhythm: Regular rhythm.  Pulmonary:     Breath sounds: No wheezing or rhonchi.  Abdominal:     Tenderness: There is no abdominal tenderness.  Musculoskeletal:        General: Tenderness present.     Cervical back: Neck supple. No tenderness.     Comments: Tenderness to left hip with decreased range of motion.  Lateral tenderness.  No lumbar tenderness.  Palpation of the knee results and pain in the hip.  No specific knee tenderness.  Neurological:     Mental Status: She is alert and oriented to person, place, and time.     ED Results / Procedures / Treatments   Labs (all labs ordered are listed, but only abnormal results are displayed) Labs Reviewed  BASIC METABOLIC PANEL - Abnormal; Notable for the following components:      Result Value   Glucose, Bld 120 (*)    All other components within normal limits  CBC - Abnormal; Notable for the following components:   WBC 11.8 (*)    RBC 3.85 (*)    All other components within normal limits  BASIC METABOLIC PANEL  CBC    EKG None  Radiology CT PELVIS W CONTRAST  Result Date: 09/08/2022 CLINICAL DATA:  Pelvic fracture EXAM: CT PELVIS WITH CONTRAST TECHNIQUE: Multidetector CT imaging of the pelvis was performed using the standard protocol following the bolus administration of intravenous contrast. RADIATION DOSE REDUCTION: This exam was performed according to the departmental dose-optimization program which includes automated exposure control, adjustment of the mA and/or kV according to patient size and/or use of iterative reconstruction technique. CONTRAST:  18m OMNIPAQUE IOHEXOL 300 MG/ML  SOLN COMPARISON:  Left hip x-ray same day FINDINGS: Urinary Tract:  No abnormality visualized. Bowel:  There is sigmoid colon diverticulosis.  No acute findings. Vascular/Lymphatic: There are atherosclerotic calcifications of the aorta and iliac arteries. No enlarged lymph nodes are seen.  Reproductive:  Uterus is not visualized. Other:  There is no pelvic free fluid or abdominal wall hernia. Musculoskeletal: The bones are diffusely osteopenic. There is an acute comminuted fracture involving the left inferior pubic ramus, nondisplaced. There is also an acute transverse fracture through the posteroinferior aspect of the left ischium with fracture fragments separated 12 mm. There is soft tissue hematoma surrounding the fracture sites. There is no dislocation. The sacroiliac joints appear intact. Left hip arthroplasty appears uncomplicated. Moderate degenerative changes of the right hip and pubic symphysis as well as lower lumbar spine. Orthopedic hardware is partially visualized in the L4 vertebral body. IMPRESSION: 1. Acute comminuted fracture of the left inferior pubic ramus. 2. Acute transverse fracture through the posteroinferior aspect of the left ischium. Aortic Atherosclerosis (  ICD10-I70.0). Electronically Signed   By: Ronney Asters M.D.   On: 09/08/2022 20:47   DG Hip Unilat W or Wo Pelvis 2-3 Views Left  Result Date: 09/08/2022 CLINICAL DATA:  Fall,  left hip pain EXAM: DG HIP (WITH OR WITHOUT PELVIS) 2-3V LEFT COMPARISON:  None Available. FINDINGS: There is displaced fracture of the left inferior pubic ramus and ischial tuberosity. Status post left hip arthroplasty without evidence of acute periprosthetic fracture or dislocation. Moderate-to-severe right hip osteoarthritis osteopenia and lumbar spine hardware is again noted IMPRESSION: 1. Displaced fracture of the left inferior pubic ramus and ischial tuberosity. 2. Status post left hip arthroplasty without evidence of periprosthetic fracture or dislocation. Electronically Signed   By: Keane Police D.O.   On: 09/08/2022 18:38    Procedures Procedures    Medications Ordered in ED Medications  sertraline (ZOLOFT) tablet 50 mg (has no administration in time range)  levothyroxine (SYNTHROID) tablet 100 mcg (has no administration in  time range)  pantoprazole (PROTONIX) EC tablet 40 mg (has no administration in time range)  cyanocobalamin (VITAMIN B12) tablet 500 mcg (has no administration in time range)  feeding supplement (ENSURE ENLIVE / ENSURE PLUS) liquid 237 mL (has no administration in time range)  budesonide (PULMICORT) nebulizer solution 0.5 mg (0.5 mg Nebulization Not Given 09/08/22 2207)  ipratropium-albuterol (DUONEB) 0.5-2.5 (3) MG/3ML nebulizer solution 3 mL (has no administration in time range)  montelukast (SINGULAIR) tablet 10 mg (has no administration in time range)  enoxaparin (LOVENOX) injection 40 mg (40 mg Subcutaneous Given 09/08/22 2229)  acetaminophen (TYLENOL) tablet 650 mg (has no administration in time range)    Or  acetaminophen (TYLENOL) suppository 650 mg (has no administration in time range)  oxyCODONE (Oxy IR/ROXICODONE) immediate release tablet 5 mg (has no administration in time range)  morphine (PF) 2 MG/ML injection 2 mg (has no administration in time range)  methocarbamol (ROBAXIN) 500 mg in dextrose 5 % 50 mL IVPB (has no administration in time range)  polyethylene glycol (MIRALAX / GLYCOLAX) packet 17 g (has no administration in time range)  senna (SENOKOT) tablet 8.6 mg (8.6 mg Oral Given 09/08/22 2229)  ondansetron (ZOFRAN) tablet 4 mg (has no administration in time range)    Or  ondansetron (ZOFRAN) injection 4 mg (has no administration in time range)  albuterol (PROVENTIL) (2.5 MG/3ML) 0.083% nebulizer solution 2.5 mg (has no administration in time range)  hydrALAZINE (APRESOLINE) injection 5 mg (has no administration in time range)  cholecalciferol (VITAMIN D3) 25 MCG (1000 UNIT) tablet 1,000 Units (has no administration in time range)  losartan (COZAAR) tablet 50 mg (has no administration in time range)  predniSONE (DELTASONE) tablet 5 mg (has no administration in time range)  rosuvastatin (CRESTOR) tablet 10 mg (10 mg Oral Given 09/08/22 2229)  umeclidinium bromide (INCRUSE ELLIPTA)  62.5 MCG/ACT 1 puff (has no administration in time range)  fentaNYL (SUBLIMAZE) injection 50 mcg (50 mcg Intravenous Given 09/08/22 1809)  iohexol (OMNIPAQUE) 300 MG/ML solution 75 mL (75 mLs Intravenous Contrast Given 09/08/22 2026)  fentaNYL (SUBLIMAZE) injection 50 mcg (50 mcg Intravenous Given 09/08/22 2133)    ED Course/ Medical Decision Making/ A&P                             Medical Decision Making Amount and/or Complexity of Data Reviewed Labs: ordered. Radiology: ordered.  Risk Prescription drug management. Decision regarding hospitalization.   Patient with fall.  Left hip pain.  Previous hip surgery by Dr. Aline Brochure.  No other apparent injury.  There is suspicious for fracture.  Will get x-ray and give some pain medicine. X-ray shows 2 areas of fracture on the inferior pubic rami and ischium.  CT scan done to look for other areas of fracture and potential bleeding.  CT scan basically confirms the other fractures.  No new fracture seen.  Does have small area of swelling/hematoma.  Discussed with Dr. Aline Brochure.  No surgical intervention required at this time.  Weight-bear as tolerated.  However patient at baseline only walks with a walker.  Patient's caregiver is here and will not be able to manage at home with this.  Continued pain I think she would benefit from admission to the hospital for pain control and physical therapy evaluation.  Will discuss with hospitalist.       Final Clinical Impression(s) / ED Diagnoses Final diagnoses:  Fall, initial encounter  Multiple closed fractures of pelvis without disruption of pelvic ring, initial encounter [facility])    Rx / DC Orders ED Discharge Orders     None         Davonna Belling, MD 09/08/22 2256

## 2022-09-09 DIAGNOSIS — I130 Hypertensive heart and chronic kidney disease with heart failure and stage 1 through stage 4 chronic kidney disease, or unspecified chronic kidney disease: Secondary | ICD-10-CM | POA: Diagnosis present

## 2022-09-09 DIAGNOSIS — N1831 Chronic kidney disease, stage 3a: Secondary | ICD-10-CM | POA: Diagnosis present

## 2022-09-09 DIAGNOSIS — I5032 Chronic diastolic (congestive) heart failure: Secondary | ICD-10-CM | POA: Diagnosis present

## 2022-09-09 DIAGNOSIS — Z96653 Presence of artificial knee joint, bilateral: Secondary | ICD-10-CM | POA: Diagnosis present

## 2022-09-09 DIAGNOSIS — W1830XA Fall on same level, unspecified, initial encounter: Secondary | ICD-10-CM | POA: Diagnosis present

## 2022-09-09 DIAGNOSIS — Z79890 Hormone replacement therapy (postmenopausal): Secondary | ICD-10-CM | POA: Diagnosis not present

## 2022-09-09 DIAGNOSIS — Z66 Do not resuscitate: Secondary | ICD-10-CM | POA: Diagnosis present

## 2022-09-09 DIAGNOSIS — S3282XA Multiple fractures of pelvis without disruption of pelvic ring, initial encounter for closed fracture: Secondary | ICD-10-CM | POA: Diagnosis not present

## 2022-09-09 DIAGNOSIS — K219 Gastro-esophageal reflux disease without esophagitis: Secondary | ICD-10-CM | POA: Diagnosis present

## 2022-09-09 DIAGNOSIS — R2681 Unsteadiness on feet: Secondary | ICD-10-CM | POA: Diagnosis not present

## 2022-09-09 DIAGNOSIS — E785 Hyperlipidemia, unspecified: Secondary | ICD-10-CM | POA: Diagnosis present

## 2022-09-09 DIAGNOSIS — S32592A Other specified fracture of left pubis, initial encounter for closed fracture: Secondary | ICD-10-CM | POA: Diagnosis present

## 2022-09-09 DIAGNOSIS — S329XXS Fracture of unspecified parts of lumbosacral spine and pelvis, sequela: Secondary | ICD-10-CM | POA: Diagnosis not present

## 2022-09-09 DIAGNOSIS — M810 Age-related osteoporosis without current pathological fracture: Secondary | ICD-10-CM | POA: Diagnosis present

## 2022-09-09 DIAGNOSIS — E890 Postprocedural hypothyroidism: Secondary | ICD-10-CM | POA: Diagnosis present

## 2022-09-09 DIAGNOSIS — S329XXA Fracture of unspecified parts of lumbosacral spine and pelvis, initial encounter for closed fracture: Secondary | ICD-10-CM | POA: Diagnosis present

## 2022-09-09 DIAGNOSIS — M8000XG Age-related osteoporosis with current pathological fracture, unspecified site, subsequent encounter for fracture with delayed healing: Secondary | ICD-10-CM | POA: Diagnosis not present

## 2022-09-09 DIAGNOSIS — M6281 Muscle weakness (generalized): Secondary | ICD-10-CM | POA: Diagnosis not present

## 2022-09-09 DIAGNOSIS — Z7952 Long term (current) use of systemic steroids: Secondary | ICD-10-CM | POA: Diagnosis not present

## 2022-09-09 DIAGNOSIS — M159 Polyosteoarthritis, unspecified: Secondary | ICD-10-CM | POA: Diagnosis not present

## 2022-09-09 DIAGNOSIS — M47816 Spondylosis without myelopathy or radiculopathy, lumbar region: Secondary | ICD-10-CM | POA: Diagnosis not present

## 2022-09-09 DIAGNOSIS — I10 Essential (primary) hypertension: Secondary | ICD-10-CM | POA: Diagnosis not present

## 2022-09-09 DIAGNOSIS — I700 Atherosclerosis of aorta: Secondary | ICD-10-CM | POA: Diagnosis not present

## 2022-09-09 DIAGNOSIS — H548 Legal blindness, as defined in USA: Secondary | ICD-10-CM | POA: Diagnosis present

## 2022-09-09 DIAGNOSIS — Z90710 Acquired absence of both cervix and uterus: Secondary | ICD-10-CM | POA: Diagnosis not present

## 2022-09-09 DIAGNOSIS — Z79899 Other long term (current) drug therapy: Secondary | ICD-10-CM | POA: Diagnosis not present

## 2022-09-09 DIAGNOSIS — Z7951 Long term (current) use of inhaled steroids: Secondary | ICD-10-CM | POA: Diagnosis not present

## 2022-09-09 DIAGNOSIS — I110 Hypertensive heart disease with heart failure: Secondary | ICD-10-CM | POA: Diagnosis not present

## 2022-09-09 DIAGNOSIS — Z1152 Encounter for screening for COVID-19: Secondary | ICD-10-CM | POA: Diagnosis not present

## 2022-09-09 DIAGNOSIS — I493 Ventricular premature depolarization: Secondary | ICD-10-CM | POA: Diagnosis not present

## 2022-09-09 DIAGNOSIS — J4489 Other specified chronic obstructive pulmonary disease: Secondary | ICD-10-CM | POA: Diagnosis present

## 2022-09-09 DIAGNOSIS — S32602D Unspecified fracture of left ischium, subsequent encounter for fracture with routine healing: Secondary | ICD-10-CM | POA: Diagnosis not present

## 2022-09-09 DIAGNOSIS — G8929 Other chronic pain: Secondary | ICD-10-CM | POA: Diagnosis present

## 2022-09-09 DIAGNOSIS — J4540 Moderate persistent asthma, uncomplicated: Secondary | ICD-10-CM | POA: Diagnosis not present

## 2022-09-09 DIAGNOSIS — F339 Major depressive disorder, recurrent, unspecified: Secondary | ICD-10-CM | POA: Diagnosis present

## 2022-09-09 DIAGNOSIS — E039 Hypothyroidism, unspecified: Secondary | ICD-10-CM | POA: Diagnosis not present

## 2022-09-09 DIAGNOSIS — M549 Dorsalgia, unspecified: Secondary | ICD-10-CM | POA: Diagnosis present

## 2022-09-09 DIAGNOSIS — R262 Difficulty in walking, not elsewhere classified: Secondary | ICD-10-CM | POA: Diagnosis not present

## 2022-09-09 DIAGNOSIS — R488 Other symbolic dysfunctions: Secondary | ICD-10-CM | POA: Diagnosis not present

## 2022-09-09 DIAGNOSIS — K449 Diaphragmatic hernia without obstruction or gangrene: Secondary | ICD-10-CM | POA: Diagnosis present

## 2022-09-09 DIAGNOSIS — M1990 Unspecified osteoarthritis, unspecified site: Secondary | ICD-10-CM | POA: Diagnosis present

## 2022-09-09 DIAGNOSIS — S32502D Unspecified fracture of left pubis, subsequent encounter for fracture with routine healing: Secondary | ICD-10-CM | POA: Diagnosis not present

## 2022-09-09 LAB — CBC
HCT: 36.4 % (ref 36.0–46.0)
Hemoglobin: 11.6 g/dL — ABNORMAL LOW (ref 12.0–15.0)
MCH: 32.4 pg (ref 26.0–34.0)
MCHC: 31.9 g/dL (ref 30.0–36.0)
MCV: 101.7 fL — ABNORMAL HIGH (ref 80.0–100.0)
Platelets: 289 10*3/uL (ref 150–400)
RBC: 3.58 MIL/uL — ABNORMAL LOW (ref 3.87–5.11)
RDW: 13.2 % (ref 11.5–15.5)
WBC: 11.2 10*3/uL — ABNORMAL HIGH (ref 4.0–10.5)
nRBC: 0 % (ref 0.0–0.2)

## 2022-09-09 LAB — BASIC METABOLIC PANEL
Anion gap: 5 (ref 5–15)
BUN: 15 mg/dL (ref 8–23)
CO2: 25 mmol/L (ref 22–32)
Calcium: 8.8 mg/dL — ABNORMAL LOW (ref 8.9–10.3)
Chloride: 103 mmol/L (ref 98–111)
Creatinine, Ser: 0.82 mg/dL (ref 0.44–1.00)
GFR, Estimated: >60 mL/min (ref >60)
Glucose, Bld: 100 mg/dL — ABNORMAL HIGH (ref 70–99)
Potassium: 3.7 mmol/L (ref 3.5–5.1)
Sodium: 133 mmol/L — ABNORMAL LOW (ref 135–145)

## 2022-09-09 LAB — SARS CORONAVIRUS 2 BY RT PCR: SARS Coronavirus 2 by RT PCR: NEGATIVE

## 2022-09-09 MED ORDER — POLYETHYLENE GLYCOL 3350 17 G PO PACK: 17.0000 g | PACK | Freq: Every day | ORAL | Status: DC | PRN

## 2022-09-09 MED ORDER — SENNA 8.6 MG PO TABS: 1.0000 | ORAL_TABLET | Freq: Two times a day (BID) | ORAL | Status: DC

## 2022-09-09 MED ORDER — FUROSEMIDE 20 MG PO TABS: 20.0000 mg | ORAL_TABLET | Freq: Every day | ORAL | Status: DC | PRN

## 2022-09-09 MED ORDER — ACETAMINOPHEN 500 MG PO TABS: 1000.0000 mg | ORAL_TABLET | Freq: Three times a day (TID) | ORAL | Status: DC

## 2022-09-09 MED ORDER — SODIUM CHLORIDE 0.9 % IV BOLUS
500.0000 mL | Freq: Once | INTRAVENOUS | Status: AC
  Administered 2022-09-09: 500 mL via INTRAVENOUS

## 2022-09-09 MED ORDER — METHOCARBAMOL 500 MG PO TABS: 500.0000 mg | ORAL_TABLET | Freq: Three times a day (TID) | ORAL | Status: DC | PRN

## 2022-09-09 MED ORDER — ACETAMINOPHEN 500 MG PO TABS
1000.0000 mg | ORAL_TABLET | Freq: Three times a day (TID) | ORAL | Status: DC
  Administered 2022-09-09 (×2): 1000 mg via ORAL
  Filled 2022-09-09 (×2): qty 2

## 2022-09-09 MED ORDER — OXYCODONE HCL 5 MG PO TABS: 5.0000 mg | ORAL_TABLET | Freq: Four times a day (QID) | ORAL | 0 refills | Status: DC | PRN

## 2022-09-09 NOTE — TOC Transition Note (Signed)
Transition of Care [facility]) - CM/SW Discharge Note   Patient Details  Name: Lindsey Butler MRN: [id number] Date of Birth: 04/03/1929  Transition of Care [facility] CM/SW Contact:  Shade Flood, LCSW Phone Number: 09/09/2022, 5:08 PM   Clinical Narrative:     Pt and DIL selected [facility] [facility]. Auth received. DC today.  DC clinical sent electronically. RN to call report.  DIL aware of dc plan. No other TOC needs.  Final next level of care: Skilled Nursing Facility Barriers to Discharge: Barriers Resolved   Patient Goals and CMS Choice CMS Medicare.gov Compare Post Acute Care list provided to:: Patient Choice offered to / list presented to : Patient, Adult Children  Discharge Placement                Patient chooses bed at: [facility] Patient to be transferred to facility by: w/c Name of family member notified: Kristal Patient and family notified of of transfer: 09/09/22  Discharge Plan and Services Additional resources added to the After Visit Summary for   In-house Referral: Clinical Social Work   Post Acute Care Choice: [place] [facility]                               Social Determinants of Health ([place]) Interventions SDOH Screenings   Food Insecurity: No Food Insecurity (09/08/2022)  Housing: Low Risk  (09/08/2022)  Transportation Needs: No Transportation Needs (09/08/2022)  Utilities: Not At Risk (09/08/2022)  Tobacco Use: Low Risk  (09/08/2022)     Readmission Risk Interventions     No data to display

## 2022-09-09 NOTE — Plan of Care (Signed)
  Problem: Acute Rehab OT Goals (only OT should resolve) Goal: Pt. Will Perform Grooming Flowsheets (Taken 09/09/2022 1238) Pt Will Perform Grooming:  with modified independence  sitting Goal: Pt. Will Perform Upper Body Bathing Flowsheets (Taken 09/09/2022 1238) Pt Will Perform Upper Body Bathing:  with min guard assist  with min assist  sitting Goal: Pt. Will Perform Upper Body Dressing Flowsheets (Taken 09/09/2022 1238) Pt Will Perform Upper Body Dressing:  with min assist  with min guard assist  sitting Goal: Pt. Will Perform Lower Body Dressing Flowsheets (Taken 09/09/2022 1238) Pt Will Perform Lower Body Dressing:  with mod assist  sitting/lateral leans Goal: Pt. Will Transfer To Toilet Flowsheets (Taken 09/09/2022 1238) Pt Will Transfer to Toilet:  with min guard assist  stand pivot transfer Goal: Pt/Caregiver Will Perform Home Exercise Program Flowsheets (Taken 09/09/2022 1238) Pt/caregiver will Perform Home Exercise Program:  Increased ROM  Increased strength  Right Upper extremity  Left upper extremity  With minimal assist  Signe Tackitt OT, MOT

## 2022-09-09 NOTE — Progress Notes (Signed)
   09/09/22 0140  Vitals  BP (!) 89/45 (MD Zierle-Ghosh made aware.)  MAP (mmHg) (!) 54  Pulse Rate 92  MEWS COLOR  MEWS Score Color Green  MEWS Score  MEWS Temp 0  MEWS Systolic 1  MEWS Pulse 0  MEWS RR 0  MEWS LOC 0  MEWS Score 1     Telemetry called. Patient having Bigeminy and Trigeminy PVCs. They have a break in them, not continuous. Charge nurse Marye Round and MD Butler notified. Received order for NS 500 bolus due to patient BP. Patient patient 7/10. Will continue to monitor.

## 2022-09-09 NOTE — Evaluation (Signed)
Physical Therapy Evaluation Patient Details Name: Lindsey Butler MRN: [id number] DOB: May 11, 1929 Today's Date: 09/09/2022  History of Present Illness  Lindsey Butler  is a 89 y.o. female, past medical history of hypertension, diastolic CHF, kidney disease, generalized weakness, asthma, GERD, hyperlipidemia, hypothyroidism, osteoarthritis, chronic back pain.  -She presents to ED secondary to fall, patient with very poor vision (legally blind per family at bedside), she was attempting to get up from a chair, and fell landing on her left hip, she was complaining of left hip, she denies any dizziness, lightheadedness, denies hitting her head, complains of hip/pelvic pain, denies dysuria, fever or chills, EMS were called, she has history of hip fracture in the past which operated by Dr. Aline Brochure.   Clinical Impression  Patient limited for functional mobility as stated below secondary to BLE weakness, fatigue and poor standing balance. Patient requires assist to transition to seated EOB. She demonstrates good sitting balance and sitting tolerance at bedside. She requires assist to transfer to standing with RW and is limited to a few small steps to chair with frequent cueing for sequencing. Patient ends session seated in chair and is assisted for repositioning in chair.  Patient will benefit from continued physical therapy in hospital and recommended venue below to increase strength, balance, endurance for safe ADLs and gait.        Recommendations for follow up therapy are one component of a multi-disciplinary discharge planning process, led by the attending physician.  Recommendations may be updated based on patient status, additional functional criteria and insurance authorization.  Follow Up Recommendations Skilled nursing-short term rehab (<3 hours/day) Can patient physically be transported by private vehicle: Yes    Assistance Recommended at Discharge Frequent or constant Supervision/Assistance   Patient can return home with the following  Assistance with cooking/housework;A lot of help with walking and/or transfers;A lot of help with bathing/dressing/bathroom    Equipment Recommendations None recommended by PT  Recommendations for Other Services       Functional Status Assessment Patient has had a recent decline in their functional status and demonstrates the ability to make significant improvements in function in a reasonable and predictable amount of time.     Precautions / Restrictions Precautions Precautions: Fall Restrictions LLE Weight Bearing: Weight bearing as tolerated      Mobility  Bed Mobility Overal bed mobility: Needs Assistance Bed Mobility: Supine to Sit     Supine to sit: Min assist, Mod assist          Transfers Overall transfer level: Needs assistance Equipment used: Rolling walker (2 wheels) Transfers: Sit to/from Stand, Bed to chair/wheelchair/BSC Sit to Stand: Min assist, Mod assist   Step pivot transfers: Min assist, Mod assist       General transfer comment: labored, unsteady,, c/o LLE pain with weightbearing    Ambulation/Gait Ambulation/Gait assistance: Min assist, Mod assist Gait Distance (Feet): 3 Feet Assistive device: Rolling walker (2 wheels) Gait Pattern/deviations: Step-through pattern, Decreased stride length, Antalgic Gait velocity: decreased     General Gait Details: frequent cueing for ambulation to chair  Stairs            Wheelchair Mobility    Modified Rankin (Stroke Patients Only)       Balance Overall balance assessment: Needs assistance Sitting-balance support: No upper extremity supported, Feet supported Sitting balance-Leahy Scale: Good Sitting balance - Comments: seated EOB   Standing balance support: Bilateral upper extremity supported, Reliant on assistive device for balance Standing balance-Leahy Scale: Poor Standing balance  comment: fair/poor                              Pertinent Vitals/Pain Pain Assessment Pain Assessment: Faces Faces Pain Scale: Hurts even more Pain Location: LLE Pain Descriptors / Indicators: Grimacing, Guarding Pain Intervention(s): Limited activity within patient's tolerance, Monitored during session, Repositioned    Home Living Family/patient expects to be discharged to:: Private residence Living Arrangements: Alone Available Help at Discharge: Family;Friend(s) Type of Home: House Home Access: Stairs to enter   [employer] of Steps: 3   Home Layout: One level Home Equipment: Conservation officer, nature (2 wheels);Cane - single point;Tub bench;Wheelchair - manual      Prior Function Prior Level of Function : Needs assist             Mobility Comments: household ambulation with RW ADLs Comments: assisted by family     Hand Dominance        Extremity/Trunk Assessment   Upper Extremity Assessment Upper Extremity Assessment: Defer to OT evaluation    Lower Extremity Assessment Lower Extremity Assessment: Generalized weakness       Communication   Communication: No difficulties  Cognition Arousal/Alertness: Awake/alert Behavior During Therapy: WFL for tasks assessed/performed Overall Cognitive Status: Within Functional Limits for tasks assessed                                          General Comments      Exercises     Assessment/Plan    PT Assessment Patient needs continued PT services  PT Problem List Decreased strength;Decreased activity tolerance;Decreased balance;Decreased mobility;Pain       PT Treatment Interventions DME instruction;Therapeutic exercise;Gait training;Balance training;Stair training;Neuromuscular re-education;Functional mobility training;Therapeutic activities;Patient/family education    PT Goals (Current goals can be found in the Care Plan section)  Acute Rehab PT Goals Patient Stated Goal: return home PT Goal Formulation: With patient Time For  Goal Achievement: 09/23/22 Potential to Achieve Goals: Good    Frequency Min 3X/week     Co-evaluation PT/OT/SLP Co-Evaluation/Treatment: Yes Reason for Co-Treatment: To address functional/ADL transfers PT goals addressed during session: Mobility/safety with mobility;Balance;Proper use of DME;Strengthening/ROM         AM-PAC PT "6 Clicks" Mobility  Outcome Measure Help needed turning from your back to your side while in a flat bed without using bedrails?: A Little Help needed moving from lying on your back to sitting on the side of a flat bed without using bedrails?: A Little Help needed moving to and from a bed to a chair (including a wheelchair)?: A Lot Help needed standing up from a chair using your arms (e.g., wheelchair or bedside chair)?: A Lot Help needed to walk in hospital room?: A Lot Help needed climbing 3-5 steps with a railing? : A Lot 6 Click Score: 14    End of Session Equipment Utilized During Treatment: Gait belt Activity Tolerance: Patient limited by pain;Patient limited by fatigue Patient left: in chair;with call bell/phone within reach Nurse Communication: Mobility status PT Visit Diagnosis: Unsteadiness on feet (R26.81);Other abnormalities of gait and mobility (R26.89);Muscle weakness (generalized) (M62.81);History of falling (Z91.81)    Time: 5277-8242 PT Time Calculation (min) (ACUTE ONLY): 23 min   Charges:   PT Evaluation $PT Eval Low Complexity: 1 Low PT Treatments $Therapeutic Activity: 8-22 mins        10:41 AM,  09/09/22 Mearl Latin PT, DPT Physical Therapist at [facility]

## 2022-09-09 NOTE — Evaluation (Signed)
Occupational Therapy Evaluation Patient Details Name: Lindsey Butler MRN: [id number] DOB: 22-Oct-1928 Today's Date: 09/09/2022   History of Present Illness Lindsey Butler  is a 89 y.o. female, past medical history of hypertension, diastolic CHF, kidney disease, generalized weakness, asthma, GERD, hyperlipidemia, hypothyroidism, osteoarthritis, chronic back pain.  -She presents to ED secondary to fall, patient with very poor vision (legally blind per family at bedside), she was attempting to get up from a chair, and fell landing on her left hip, she was complaining of left hip, she denies any dizziness, lightheadedness, denies hitting her head, complains of hip/pelvic pain, denies dysuria, fever or chills, EMS were called, she has history of hip fracture in the past which operated by Dr. Aline Brochure.   Clinical Impression   Pt agreeable to OT and PT co-evaluation. Pt assisted for some ADL's at baseline but able to transfer and complete bed mobility without physical assist. Today pt requires min to mod A for bed mobility and transfers. Pt is limited in B UE with R UE limited at baseline and reports of increased weakness overall. Pt was left in the chair with the call bell within reach. Pt will benefit from continued OT in the hospital and recommended venue below to increase strength, balance, and endurance for safe ADL's.         Recommendations for follow up therapy are one component of a multi-disciplinary discharge planning process, led by the attending physician.  Recommendations may be updated based on patient status, additional functional criteria and insurance authorization.   Follow Up Recommendations  Skilled nursing-short term rehab (<3 hours/day)     Assistance Recommended at Discharge Intermittent Supervision/Assistance  Patient can return home with the following A lot of help with walking and/or transfers;A lot of help with bathing/dressing/bathroom;Assistance with cooking/housework;Help  with stairs or ramp for entrance;Assist for transportation    Functional Status Assessment  Patient has had a recent decline in their functional status and demonstrates the ability to make significant improvements in function in a reasonable and predictable amount of time.  Equipment Recommendations  None recommended by OT    Recommendations for Other Services       Precautions / Restrictions Precautions Precautions: Fall Restrictions Weight Bearing Restrictions: Yes LLE Weight Bearing: Weight bearing as tolerated      Mobility Bed Mobility Overal bed mobility: Needs Assistance Bed Mobility: Supine to Sit     Supine to sit: Min assist, Mod assist          Transfers Overall transfer level: Needs assistance Equipment used: Rolling walker (2 wheels) Transfers: Sit to/from Stand, Bed to chair/wheelchair/BSC Sit to Stand: Min assist, Mod assist     Step pivot transfers: Min assist, Mod assist     General transfer comment: labored; difficulty mobilizing R UE and bearing weight on L LE.      Balance Overall balance assessment: Needs assistance Sitting-balance support: No upper extremity supported, Feet supported Sitting balance-Leahy Scale: Good Sitting balance - Comments: seated EOB   Standing balance support: Bilateral upper extremity supported, Reliant on assistive device for balance Standing balance-Leahy Scale: Poor Standing balance comment: fair/poor                           ADL either performed or assessed with clinical judgement   ADL Overall ADL's : Needs assistance/impaired Eating/Feeding: Sitting;Set up   Grooming: Minimal assistance;Moderate assistance;Sitting   Upper Body Bathing: Minimal assistance;Moderate assistance;Sitting   Lower Body Bathing: Maximal  assistance;Bed level;Sitting/lateral leans   Upper Body Dressing : Moderate assistance;Sitting   Lower Body Dressing: Maximal assistance;Bed level Lower Body Dressing Details  (indicate cue type and reason): Max A to don socks in bed. Toilet Transfer: Moderate assistance;Minimal assistance;Stand-pivot;Rolling walker (2 wheels) Toilet Transfer Details (indicate cue type and reason): Simulated via EOB to chair. Toileting- Clothing Manipulation and Hygiene: Maximal assistance;Bed level;Sit to/from stand       Functional mobility during ADLs: Moderate assistance;Rolling walker (2 wheels)       Vision Baseline Vision/History: 2 Legally blind (Chart states pt is legally blind. Pt reported that she can see some.) Ability to See in Adequate Light: 3 Highly impaired Patient Visual Report: No change from baseline Additional Comments: Highly impaired at baseline.                Pertinent Vitals/Pain Pain Assessment Pain Assessment: Faces Faces Pain Scale: Hurts even more Pain Location: LLE Pain Descriptors / Indicators: Grimacing, Guarding Pain Intervention(s): Limited activity within patient's tolerance, Monitored during session, Repositioned     Hand Dominance Right   Extremity/Trunk Assessment Upper Extremity Assessment Upper Extremity Assessment: RUE deficits/detail;LUE deficits/detail RUE Deficits / Details: 2-/5 R UE shoulder flexion. Limited to ~50% P/ROM. Generally weak otherwise. LUE Deficits / Details: 2+/5 shoulder flexion; near [facility] P/ROM for shoulder flexion. Generally weak otherwise.   Lower Extremity Assessment Lower Extremity Assessment: Defer to PT evaluation   Cervical / Trunk Assessment Cervical / Trunk Assessment: Kyphotic   Communication Communication Communication: No difficulties   Cognition Arousal/Alertness: Awake/alert Behavior During Therapy: WFL for tasks assessed/performed Overall Cognitive Status: Within Functional Limits for tasks assessed                                                        Home Living Family/patient expects to be discharged to:: Private residence Living Arrangements:  Alone Available Help at Discharge: Family;Friend(s) Type of Home: House Home Access: Stairs to enter [employer] of Steps: 3 Entrance Stairs-Rails: Right (going down) Home Layout: One level     Bathroom Shower/Tub: Occupational psychologist: Standard Bathroom Accessibility: Yes   Home Equipment: Conservation officer, nature (2 wheels);Cane - single point;Tub bench;Wheelchair - manual;BSC/3in1          Prior Functioning/Environment Prior Level of Function : Needs assist       Physical Assist : ADLs (physical)   ADLs (physical): Bathing;Dressing;IADLs Mobility Comments: household ambulation with RW ADLs Comments: assisted by family for bathing, dressing, and IADL's.        OT Problem List: Decreased strength;Decreased range of motion;Decreased activity tolerance;Impaired balance (sitting and/or standing);Pain      OT Treatment/Interventions: Self-care/ADL training;Therapeutic exercise;Therapeutic activities;Patient/family education;Balance training;DME and/or AE instruction    OT Goals(Current goals can be found in the care plan section) Acute Rehab OT Goals Patient Stated Goal: Get stronger at rehab. OT Goal Formulation: With patient Time For Goal Achievement: 09/23/22 Potential to Achieve Goals: Good  OT Frequency: Min 2X/week    Co-evaluation PT/OT/SLP Co-Evaluation/Treatment: Yes Reason for Co-Treatment: To address functional/ADL transfers PT goals addressed during session: Mobility/safety with mobility;Balance;Proper use of DME;Strengthening/ROM OT goals addressed during session: ADL's and self-care      AM-PAC OT "6 Clicks" Daily Activity     Outcome Measure Help from another person eating meals?: A Little Help from another  person taking care of personal grooming?: A Little Help from another person toileting, which includes using toliet, bedpan, or urinal?: A Lot Help from another person bathing (including washing, rinsing, drying)?: A Lot Help from  another person to put on and taking off regular upper body clothing?: A Lot Help from another person to put on and taking off regular lower body clothing?: A Lot 6 Click Score: 14   End of Session Equipment Utilized During Treatment: Rolling walker (2 wheels);Gait belt  Activity Tolerance: Patient tolerated treatment well Patient left: in chair;with call bell/phone within reach  OT Visit Diagnosis: Unsteadiness on feet (R26.81);Other abnormalities of gait and mobility (R26.89);History of falling (Z91.81);Pain Pain - Right/Left: Left Pain - part of body: Hip                Time: 5681-2751 OT Time Calculation (min): 23 min Charges:  OT General Charges $OT Visit: 1 Visit OT Evaluation $OT Eval Low Complexity: 1 Low  Shravan Salahuddin OT, MOT   Larey Seat 09/09/2022, 12:36 PM

## 2022-09-09 NOTE — Discharge Summary (Signed)
Physician Discharge Summary   Patient: Lindsey Butler MRN: [id number] DOB: 1929-02-01  Admit date:     09/08/2022  Discharge date: 09/09/22  Discharge Physician: Barton Dubois   PCP: Neale Burly, MD   Recommendations at discharge:  Repeat basic metabolic panel to follow electrolytes and renal function Repeat CBC to follow hemoglobin trend and stability  Discharge Diagnoses: Pelvic fracture ([place]) Essential hypertension Major depression, recurrent, chronic (HCC) Hypothyroidism Chronic diastolic heart failure Gastroesophageal reflux disease History of osteoarthritis History of osteoporosis Chronic use of steroids  Hospital Course: As per H&P written by Dr. Waldron Labs on 09/08/2022 Lindsey Butler  is a 89 y.o. female, past medical history of hypertension, diastolic CHF, chronic kidney disease (stage III a), generalized weakness, asthma, GERD, hyperlipidemia, hypothyroidism, osteoporosis and osteoarthritis. -She presents to ED secondary to fall, patient with very poor vision (legally blind per family at bedside), she was attempting to get up from a chair, and fell landing on her left hip, she was complaining of left hip, she denies any dizziness, lightheadedness, denies hitting her head, complains of hip/pelvic pain, denies dysuria, fever or chills, EMS were called, she has history of hip fracture in the past which was operated by Dr. Aline Brochure. -In ED CT pelvis was significant for pelvic fracture, no significant labs abnormalities, she required multiple doses of fentanyl while in ED, so [facility] hospitalist consulted to admit.  Assessment and Plan: 1-pelvic fracture -Images reviewed by orthopedic service on-call who has recommended no surgical intervention and to focus on weightbearing as tolerated and analgesic management. -Physical therapy has evaluated patient with recommendation for a skilled nursing facility at time of discharge. -Patient will use schedule at 1000 mg of Tylenol 3 times  a day; as needed oxycodone for severe pain and also the use of Robaxin for muscle relaxation purposes. -Physical therapy, rehabilitation and conditioning as per the skilled nursing facility protocol.  2-history of asthma/COPD -No using oxygen supplementation at baseline -Patient denies shortness of breath and is currently no wheezing. -Continue home bronchodilator management including Pulmicort, as needed DuoNeb, Spiriva, trillegy and the use of Singulair. -Continue chronic daily use of prednisone.  3-essential hypertension -Stable and well-controlled -Continue home antihypertensive agents -No added salt diet recommended.  4-gastroesophageal reflux disease -Continue PPI.  5-history of hypothyroidism -Continue Synthroid.  6-history of chronic diastolic heart failure -Euvolemic and compensated -Continue to follow daily weights, low-sodium diet and current use of antihypertensive agents. -Patient will continue the use of as needed Lasix for edema and shortness of breath.  7-history of depression -No suicidal ideation or hallucination -Continue supportive care and home antidepressant regimen.  8-history of osteoporosis -Continue calcium and vitamin D.   Consultants: Orthopedic service was curbside by EDP. Procedures performed: See below for x-ray reports. Disposition: Skilled nursing facility Diet recommendation: Heart healthy/low-sodium diet  DISCHARGE MEDICATION: Allergies as of 09/09/2022   No Known Allergies      Medication List     STOP taking these medications    acetaminophen 650 MG CR tablet Commonly known as: TYLENOL Replaced by: acetaminophen 500 MG tablet   NON FORMULARY       TAKE these medications    acetaminophen 500 MG tablet Commonly known as: TYLENOL Take 2 tablets (1,000 mg total) by mouth every 8 (eight) hours. Replaces: acetaminophen 650 MG CR tablet   albuterol 108 (90 Base) MCG/ACT inhaler Commonly known as: VENTOLIN HFA Inhale 2  puffs into the lungs every 4 (four) hours as needed for wheezing or shortness of breath.  budesonide 0.5 MG/2ML nebulizer solution Commonly known as: PULMICORT Take 2 mLs (0.5 mg total) by nebulization 2 (two) times daily.   cholecalciferol 25 MCG (1000 UNIT) tablet Commonly known as: VITAMIN D3 Take 1,000 Units by mouth daily.   cyanocobalamin 1000 MCG tablet Commonly known as: VITAMIN B12 Take 1,000 mcg by mouth daily.   diclofenac Sodium 1 % Gel Commonly known as: VOLTAREN Apply 2 g topically 4 (four) times daily.   furosemide 20 MG tablet Commonly known as: LASIX Take 1 tablet (20 mg total) by mouth daily as needed for fluid or edema. What changed:  when to take this reasons to take this   ipratropium-albuterol 0.5-2.5 (3) MG/3ML Soln Commonly known as: DUONEB Take 3 mLs by nebulization every 6 (six) hours as needed (SOB).   levothyroxine 100 MCG tablet Commonly known as: SYNTHROID Take 100 mcg by mouth daily.   losartan 50 MG tablet Commonly known as: COZAAR Take 50 mg by mouth daily.   methocarbamol 500 MG tablet Commonly known as: ROBAXIN Take 1 tablet (500 mg total) by mouth every 8 (eight) hours as needed for muscle spasms.   montelukast 10 MG tablet Commonly known as: SINGULAIR Take 1 tablet (10 mg total) by mouth at bedtime.   oxyCODONE 5 MG immediate release tablet Commonly known as: Oxy IR/ROXICODONE Take 1-2 tablets (5-10 mg total) by mouth every 6 (six) hours as needed for severe pain.   pantoprazole 40 MG tablet Commonly known as: PROTONIX Take 40 mg by mouth daily.   polyethylene glycol 17 g packet Commonly known as: MIRALAX / GLYCOLAX Take 17 g by mouth daily as needed for mild constipation.   predniSONE 5 MG tablet Commonly known as: DELTASONE Take 5 mg by mouth daily.   rosuvastatin 10 MG tablet Commonly known as: CRESTOR Take 10 mg by mouth at bedtime.   senna 8.6 MG Tabs tablet Commonly known as: SENOKOT Take 1 tablet (8.6 mg  total) by mouth 2 (two) times daily. Hold for diarrhea.   sertraline 50 MG tablet Commonly known as: ZOLOFT Take 50 mg by mouth daily.   Spiriva HandiHaler 18 MCG inhalation capsule Generic drug: tiotropium 1 capsule daily.   Trelegy Ellipta 100-62.5-25 MCG/ACT Aepb Generic drug: Fluticasone-Umeclidin-Vilant Inhale 1 puff into the lungs daily.        Follow-up Information     Neale Burly, MD. Schedule an appointment as soon as possible for a visit in 10 day(s).   Specialty: Internal Medicine Why: After discharge from the skilled nursing facility. Contact information: [place] [address] [phone]                Discharge Exam: Filed Weights   09/08/22 1801  Weight: 59.9 kg   General exam: Alert, awake, oriented x 3; speaking in full sentences and denying chest pain, shortness of breath, nausea or vomiting.  Patient reports pain on her left hip with movement and weightbearing. Respiratory system: Good air movement bilaterally; no wheezing or crackles. Cardiovascular system: Rate controlled; no rubs, no gallops, no JVD. Gastrointestinal system: Abdomen is nondistended, soft and nontender. No organomegaly or masses felt. Normal bowel sounds heard. Central nervous system: Alert and oriented. No focal neurological deficits. Extremities: No cyanosis or clubbing. Skin: No petechiae. Psychiatry: Judgement and insight appear normal. Mood & affect appropriate.    Condition at discharge: Stable and in no acute distress.  The results of significant diagnostics from this hospitalization (including imaging, microbiology, ancillary and laboratory) are  listed below for reference.   Imaging Studies: CT PELVIS W CONTRAST  Result Date: 09/08/2022 CLINICAL DATA:  Pelvic fracture EXAM: CT PELVIS WITH CONTRAST TECHNIQUE: Multidetector CT imaging of the pelvis was performed using the standard protocol following the bolus administration of intravenous  contrast. RADIATION DOSE REDUCTION: This exam was performed according to the departmental dose-optimization program which includes automated exposure control, adjustment of the mA and/or kV according to patient size and/or use of iterative reconstruction technique. CONTRAST:  98m OMNIPAQUE IOHEXOL 300 MG/ML  SOLN COMPARISON:  Left hip x-ray same day FINDINGS: Urinary Tract:  No abnormality visualized. Bowel:  There is sigmoid colon diverticulosis.  No acute findings. Vascular/Lymphatic: There are atherosclerotic calcifications of the aorta and iliac arteries. No enlarged lymph nodes are seen. Reproductive:  Uterus is not visualized. Other:  There is no pelvic free fluid or abdominal wall hernia. Musculoskeletal: The bones are diffusely osteopenic. There is an acute comminuted fracture involving the left inferior pubic ramus, nondisplaced. There is also an acute transverse fracture through the posteroinferior aspect of the left ischium with fracture fragments separated 12 mm. There is soft tissue hematoma surrounding the fracture sites. There is no dislocation. The sacroiliac joints appear intact. Left hip arthroplasty appears uncomplicated. Moderate degenerative changes of the right hip and pubic symphysis as well as lower lumbar spine. Orthopedic hardware is partially visualized in the L4 vertebral body. IMPRESSION: 1. Acute comminuted fracture of the left inferior pubic ramus. 2. Acute transverse fracture through the posteroinferior aspect of the left ischium. Aortic Atherosclerosis (ICD10-I70.0). Electronically Signed   By: ARonney AstersM.D.   On: 09/08/2022 20:47   DG Hip Unilat W or Wo Pelvis 2-3 Views Left  Result Date: 09/08/2022 CLINICAL DATA:  Fall,  left hip pain EXAM: DG HIP (WITH OR WITHOUT PELVIS) 2-3V LEFT COMPARISON:  None Available. FINDINGS: There is displaced fracture of the left inferior pubic ramus and ischial tuberosity. Status post left hip arthroplasty without evidence of acute  periprosthetic fracture or dislocation. Moderate-to-severe right hip osteoarthritis osteopenia and lumbar spine hardware is again noted IMPRESSION: 1. Displaced fracture of the left inferior pubic ramus and ischial tuberosity. 2. Status post left hip arthroplasty without evidence of periprosthetic fracture or dislocation. Electronically Signed   By: IKeane PoliceD.O.   On: 09/08/2022 18:38    Microbiology: Results for orders placed or performed during the hospital encounter of 03/02/21  Resp Panel by RT-PCR (Flu A&B, Covid) Nasopharyngeal Swab     Status: None   Collection Time: 03/02/21  9:54 AM   Specimen: Nasopharyngeal Swab; Nasopharyngeal(NP) swabs in vial transport medium  Result Value Ref Range Status   SARS Coronavirus 2 by RT PCR NEGATIVE NEGATIVE Final    Comment: (NOTE) SARS-CoV-2 target nucleic acids are NOT DETECTED.  The SARS-CoV-2 RNA is generally detectable in upper respiratory specimens during the acute phase of infection. The lowest concentration of SARS-CoV-2 viral copies this assay can detect is 138 copies/mL. A negative result does not preclude SARS-Cov-2 infection and should not be used as the sole basis for treatment or other patient management decisions. A negative result may occur with  improper specimen collection/handling, submission of specimen other than nasopharyngeal swab, presence of viral mutation(s) within the areas targeted by this assay, and inadequate number of viral copies(<138 copies/mL). A negative result must be combined with clinical observations, patient history, and epidemiological information. The expected result is Negative.  Fact Sheet for Patients:  hEntrepreneurPulse.com.au Fact Sheet for Healthcare Providers:  hIncredibleEmployment.be  This test is no t yet approved or cleared by the [place] and  has been authorized for detection and/or diagnosis of SARS-CoV-2 by FDA under an Emergency Use  Authorization (EUA). This EUA will remain  in effect (meaning this test can be used) for the duration of the COVID-19 declaration under Section 564(b)(1) of the Act, 21 U.S.C.section 360bbb-3(b)(1), unless the authorization is terminated  or revoked sooner.       Influenza A by PCR NEGATIVE NEGATIVE Final   Influenza B by PCR NEGATIVE NEGATIVE Final    Comment: (NOTE) The Xpert Xpress SARS-CoV-2/FLU/RSV plus assay is intended as an aid in the diagnosis of influenza from Nasopharyngeal swab specimens and should not be used as a sole basis for treatment. Nasal washings and aspirates are unacceptable for Xpert Xpress SARS-CoV-2/FLU/RSV testing.  Fact Sheet for Patients: EntrepreneurPulse.com.au  Fact Sheet for Healthcare Providers: IncredibleEmployment.be  This test is not yet approved or cleared by the [place] FDA and has been authorized for detection and/or diagnosis of SARS-CoV-2 by FDA under an Emergency Use Authorization (EUA). This EUA will remain in effect (meaning this test can be used) for the duration of the COVID-19 declaration under Section 564(b)(1) of the Act, 21 U.S.C. section 360bbb-3(b)(1), unless the authorization is terminated or revoked.  Performed at [facility], [address], [place], [place] [address]   Blood culture (routine x 2)     Status: None   Collection Time: 03/02/21 10:18 AM   Specimen: BLOOD  Result Value Ref Range Status   Specimen Description BLOOD LEFT ARM  Final   Special Requests   Final    BOTTLES DRAWN AEROBIC AND ANAEROBIC Blood Culture adequate volume   Culture   Final    NO GROWTH 5 DAYS Performed at [facility], [address] [place], [place] [address]    Report Status 03/07/2021 FINAL  Final  Blood culture (routine x 2)     Status: None   Collection Time: 03/02/21 10:19 AM   Specimen: BLOOD  Result Value Ref Range Status   Specimen Description BLOOD RIGHT ARM  Final   Special  Requests   Final    BOTTLES DRAWN AEROBIC AND ANAEROBIC Blood Culture adequate volume   Culture   Final    NO GROWTH 5 DAYS Performed at [facility], [address], [place], [place] [address]    Report Status 03/07/2021 FINAL  Final    Labs: CBC: Recent Labs  Lab 09/08/22 1843 09/09/22 0443  WBC 11.8* 11.2*  HGB 12.0 11.6*  HCT 37.3 36.4  MCV 96.9 101.7*  PLT 324 702   Basic Metabolic Panel: Recent Labs  Lab 09/08/22 1843 09/09/22 0443  NA 135 133*  K 3.7 3.7  CL 100 103  CO2 25 25  GLUCOSE 120* 100*  BUN 16 15  CREATININE 0.87 0.82  CALCIUM 9.5 8.8*   Discharge time spent: greater than 30 minutes.  Signed: Barton Dubois, MD [facility] 09/09/2022

## 2022-09-09 NOTE — NC FL2 (Signed)
[place] LEVEL OF CARE FORM     IDENTIFICATION  Patient Name: Lindsey Butler Birthdate: 1928/09/01 Sex: female Admission Date (Current Location): 09/08/2022  [facility] and [place] Number:  [employer] and Address:  [place] [address] [place]      Provider Number: [id number]  Attending Physician Name and Address:  Barton Dubois, MD  Relative Name and Phone Number:       Current Level of Care: Hospital Recommended Level of Care: [place] Prior Approval Number:    Date Approved/Denied:   PASRR Number: [id number]  Discharge Plan: SNF    Current Diagnoses: Patient Active Problem List   Diagnosis Date Noted   Pelvic fracture ([place]) 09/08/2022   PNA (pneumonia) 03/02/2021   SIRS (systemic inflammatory response syndrome) ([place]) 03/02/2021   Chronic bilateral low back pain without sciatica 10/30/2019   Status post total hip replacement, left 10/19/2019   Post-operative hypothyroidism 10/13/2019   Essential hypertension 10/13/2019   Major depression, recurrent, chronic ([place]) 10/13/2019   Hiatal hernia with GERD without esophagitis 10/13/2019   Increased intraocular pressure, bilateral 10/13/2019   Closed fracture of left hip ([place]) 10/04/2019   Fracture of femoral neck, left ([place]) 10/03/2019   Complete tear of right rotator cuff 06/12/2018   Major depressive disorder, recurrent episode, moderate (HCC) 05/17/2018   Lumbar spondylosis 05/01/2018   Spinal stenosis of lumbar region without neurogenic claudication 09/27/2016   Basal cell carcinoma of skin of other parts of face 08/29/2016   Contusion of hip 37/85/8850   Acute systolic heart failure ([place]) 12/07/2015   Headache 09/06/2015   Onychomycosis due to dermatophyte 04/14/2015   Edema 03/29/2015   Neuralgia, neuritis or radiculitis 03/27/2014   Osteoporosis 10/28/2013   Hypothyroidism 07/17/2013   Dysuria 10/01/2012   Asthma 09/14/2012   Chronic  cough 05/23/2012   Diverticulosis of colon 05/05/2011    Orientation RESPIRATION BLADDER Height & Weight     Self, Time, Situation, Place  Normal Continent Weight: 132 lb (59.9 kg) Height:  '5\' 7"'$  (170.2 cm)  BEHAVIORAL SYMPTOMS/MOOD NEUROLOGICAL BOWEL NUTRITION STATUS      Continent Diet (see dc summary)  AMBULATORY STATUS COMMUNICATION OF NEEDS Skin   Extensive Assist Verbally Normal                       Personal Care Assistance Level of Assistance  Bathing, Feeding, Dressing Bathing Assistance: Limited assistance Feeding assistance: Independent Dressing Assistance: Limited assistance     Functional Limitations Info  Sight, Hearing, Speech Sight Info: Adequate Hearing Info: Impaired Speech Info: Adequate    SPECIAL CARE FACTORS FREQUENCY  PT (By licensed PT), OT (By licensed OT)     PT Frequency: 5x week OT Frequency: 3x week            Contractures Contractures Info: Not present    Additional Factors Info  Code Status, Allergies Code Status Info: DNR Allergies Info: NKA           Current Medications (09/09/2022):  This is the current hospital active medication list Current Facility-Administered Medications  Medication Dose Route Frequency Provider Last Rate Last Admin   acetaminophen (TYLENOL) tablet 1,000 mg  1,000 mg Oral Q8H Barton Dubois, MD   1,000 mg at 09/09/22 0834   albuterol (PROVENTIL) (2.5 MG/3ML) 0.083% nebulizer solution 2.5 mg  2.5 mg Nebulization Q6H Elgergawy, Silver Huguenin, MD   2.5 mg at 09/09/22 0755   budesonide (PULMICORT)  nebulizer solution 0.5 mg  0.5 mg Nebulization BID Elgergawy, Silver Huguenin, MD   0.5 mg at 09/09/22 0755   cholecalciferol (VITAMIN D3) 25 MCG (1000 UNIT) tablet 1,000 Units  1,000 Units Oral Daily Elgergawy, Silver Huguenin, MD   1,000 Units at 09/09/22 0834   enoxaparin (LOVENOX) injection 40 mg  40 mg Subcutaneous Q24H Elgergawy, Silver Huguenin, MD   40 mg at 09/08/22 2229   feeding supplement (ENSURE ENLIVE / ENSURE PLUS) liquid  237 mL  237 mL Oral BID BM Elgergawy, Silver Huguenin, MD   237 mL at 09/09/22 0835   hydrALAZINE (APRESOLINE) injection 5 mg  5 mg Intravenous Q4H PRN Elgergawy, Silver Huguenin, MD       ipratropium-albuterol (DUONEB) 0.5-2.5 (3) MG/3ML nebulizer solution 3 mL  3 mL Nebulization Q6H PRN Elgergawy, Silver Huguenin, MD       levothyroxine (SYNTHROID) tablet 100 mcg  100 mcg Oral Q0600 Elgergawy, Silver Huguenin, MD   100 mcg at 09/09/22 4580   losartan (COZAAR) tablet 50 mg  50 mg Oral Daily Elgergawy, Silver Huguenin, MD   50 mg at 09/09/22 9983   methocarbamol (ROBAXIN) 500 mg in dextrose 5 % 50 mL IVPB  500 mg Intravenous Q6H PRN Elgergawy, Silver Huguenin, MD       montelukast (SINGULAIR) tablet 10 mg  10 mg Oral QHS Elgergawy, Silver Huguenin, MD       morphine (PF) 2 MG/ML injection 2 mg  2 mg Intravenous Q4H PRN Elgergawy, Silver Huguenin, MD   2 mg at 09/08/22 2340   ondansetron (ZOFRAN) tablet 4 mg  4 mg Oral Q6H PRN Elgergawy, Silver Huguenin, MD       Or   ondansetron (ZOFRAN) injection 4 mg  4 mg Intravenous Q6H PRN Elgergawy, Silver Huguenin, MD       oxyCODONE (Oxy IR/ROXICODONE) immediate release tablet 5 mg  5 mg Oral Q6H PRN Elgergawy, Silver Huguenin, MD   5 mg at 09/09/22 0834   pantoprazole (PROTONIX) EC tablet 40 mg  40 mg Oral Daily Elgergawy, Silver Huguenin, MD   40 mg at 09/09/22 0834   polyethylene glycol (MIRALAX / GLYCOLAX) packet 17 g  17 g Oral Daily PRN Elgergawy, Silver Huguenin, MD       predniSONE (DELTASONE) tablet 5 mg  5 mg Oral Daily Elgergawy, Silver Huguenin, MD   5 mg at 09/09/22 0834   rosuvastatin (CRESTOR) tablet 10 mg  10 mg Oral QHS Elgergawy, Silver Huguenin, MD   10 mg at 09/08/22 2229   senna (SENOKOT) tablet 8.6 mg  1 tablet Oral BID Elgergawy, Silver Huguenin, MD   8.6 mg at 09/09/22 0834   sertraline (ZOLOFT) tablet 50 mg  50 mg Oral Daily Elgergawy, Silver Huguenin, MD   50 mg at 09/09/22 0834   umeclidinium bromide (INCRUSE ELLIPTA) 62.5 MCG/ACT 1 puff  1 puff Inhalation Daily Elgergawy, Silver Huguenin, MD   1 puff at 09/09/22 0755   vitamin B-12 (CYANOCOBALAMIN)  tablet 500 mcg  500 mcg Oral Daily Elgergawy, Silver Huguenin, MD   500 mcg at 09/09/22 3825     Discharge Medications: Please see discharge summary for a list of discharge medications.  Relevant Imaging Results:  Relevant Lab Results:   Additional Information SSN: [id number] LCSW

## 2022-09-09 NOTE — Plan of Care (Signed)
  Problem: Acute Rehab PT Goals(only PT should resolve) Goal: Pt Will Go Supine/Side To Sit Outcome: Progressing Flowsheets (Taken 09/09/2022 1042) Pt will go Supine/Side to Sit: with minimal assist Goal: Pt Will Go Sit To Supine/Side Outcome: Progressing Flowsheets (Taken 09/09/2022 1042) Pt will go Sit to Supine/Side: with minimal assist Goal: Patient Will Transfer Sit To/From Stand Outcome: Progressing Flowsheets (Taken 09/09/2022 1042) Patient will transfer sit to/from stand:  with minimal assist  with min guard assist Goal: Pt Will Transfer Bed To Chair/Chair To Bed Outcome: Progressing Flowsheets (Taken 09/09/2022 1042) Pt will Transfer Bed to Chair/Chair to Bed:  with min assist  min guard assist Goal: Pt Will Ambulate Outcome: Progressing Flowsheets (Taken 09/09/2022 1042) Pt will Ambulate:  25 feet  with minimal assist  with rolling walker Goal: Pt/caregiver will Perform Home Exercise Program Outcome: Progressing Flowsheets (Taken 09/09/2022 1042) Pt/caregiver will Perform Home Exercise Program:  For improved balance  For increased strengthening  Independently  10:43 AM, 09/09/22 Mearl Latin PT, DPT Physical Therapist at [facility]

## 2022-09-09 NOTE — TOC Initial Note (Signed)
Transition of Care [facility]) - Initial/Assessment Note    Patient Details  Name: Lindsey Butler MRN: [id number] Date of Birth: Mar 06, 1929  [facility] CM/SW Contact:    Shade Flood, LCSW Phone Number: 09/09/2022, 11:47 AM  Clinical Narrative:                  Pt admitted from home. PT recommending SNF rehab. Met with pt and her DIL at bedside to assess and review dc planning.  Pt resides in her own home. DIL is with her for caregiving needs on a daily basis. Reviewed PT recommendation for SNF rehab and pt/DIL agreeable. CMS provider options reviewed. Will refer as requested.  TOC will start insurance auth and follow for bed offers.   Expected Discharge Plan: Skilled Nursing Facility Barriers to Discharge: Continued Medical Work up, Ship broker   Patient Goals and CMS Choice Patient states their goals for this hospitalization and ongoing recovery are:: get better CMS Medicare.gov Compare Post Acute Care list provided to:: Patient Choice offered to / list presented to : Patient, Adult [place] ownership interest in [facility].provided to:: Patient    Expected Discharge Plan and Services In-house Referral: Clinical Social Work   Post Acute Care Choice: [place] Living arrangements for the past 2 months: [place]                                      Prior Living Arrangements/Services Living arrangements for the past 2 months: Single Family Home Lives with:: Self Patient language and need for interpreter reviewed:: Yes Do you feel safe going back to the place where you live?: Yes      Need for Family Participation in Patient Care: Yes (Comment) Care giver support system in place?: Yes (comment)   Criminal Activity/Legal Involvement Pertinent to Current Situation/Hospitalization: No - Comment as needed  Activities of Daily Living Home Assistive Devices/Equipment: None ADL Screening (condition  at time of admission) Patient's cognitive ability adequate to safely complete daily activities?: Yes Is the patient deaf or have difficulty hearing?: Yes Does the patient have difficulty seeing, even when wearing glasses/contacts?: No Does the patient have difficulty concentrating, remembering, or making decisions?: Yes Patient able to express need for assistance with ADLs?: No Does the patient have difficulty dressing or bathing?: Yes Independently performs ADLs?: No Communication: Needs assistance Is this a change from baseline?: Change from baseline, expected to last >3 days Dressing (OT): Needs assistance Is this a change from baseline?: Change from baseline, expected to last >3 days Grooming: Needs assistance Is this a change from baseline?: Change from baseline, expected to last >3 days Feeding: Independent Bathing: Needs assistance Is this a change from baseline?: Change from baseline, expected to last >3 days Toileting: Needs assistance Is this a change from baseline?: Change from baseline, expected to last >3days In/Out Bed: Needs assistance Is this a change from baseline?: Change from baseline, expected to last >3 days Walks in Home: Independent Does the patient have difficulty walking or climbing stairs?: Yes Weakness of Legs: Both Weakness of Arms/Hands: Both  Permission Sought/Granted Permission sought to share information with : Facility Art therapist granted to share information with : Yes, Verbal Permission Granted     Permission granted to share info w AGENCY: snfs        Emotional Assessment Appearance:: Appears younger than stated age Attitude/Demeanor/Rapport: Engaged  Affect (typically observed): Pleasant Orientation: : Oriented to Self, Oriented to Place, Oriented to  Time, Oriented to Situation Alcohol / Substance Use: Not Applicable Psych Involvement: No (comment)  Admission diagnosis:  Pelvic fracture ([place]) [S32.9XXA] Fall,  initial encounter [id number]] Multiple closed fractures of pelvis without disruption of pelvic ring, initial encounter ([place]) [S32.82XA] Patient Active Problem List   Diagnosis Date Noted   Pelvic fracture ([place]) 09/08/2022   PNA (pneumonia) 03/02/2021   SIRS (systemic inflammatory response syndrome) ([place]) 03/02/2021   Chronic bilateral low back pain without sciatica 10/30/2019   Status post total hip replacement, left 10/19/2019   Post-operative hypothyroidism 10/13/2019   Essential hypertension 10/13/2019   Major depression, recurrent, chronic ([place]) 10/13/2019   Hiatal hernia with GERD without esophagitis 10/13/2019   Increased intraocular pressure, bilateral 10/13/2019   Closed fracture of left hip ([place]) 10/04/2019   Fracture of femoral neck, left ([place]) 10/03/2019   Complete tear of right rotator cuff 06/12/2018   Major depressive disorder, recurrent episode, moderate ([place]) 05/17/2018   Lumbar spondylosis 05/01/2018   Spinal stenosis of lumbar region without neurogenic claudication 09/27/2016   Basal cell carcinoma of skin of other parts of face 08/29/2016   Contusion of hip 66/59/9357   Acute systolic heart failure ([place]) 12/07/2015   Headache 09/06/2015   Onychomycosis due to dermatophyte 04/14/2015   Edema 03/29/2015   Neuralgia, neuritis or radiculitis 03/27/2014   Osteoporosis 10/28/2013   Hypothyroidism 07/17/2013   Dysuria 10/01/2012   Asthma 09/14/2012   Chronic cough 05/23/2012   Diverticulosis of colon 05/05/2011   PCP:  Neale Burly, MD Pharmacy:   [facility], [place] [id number] Phone: [phone] Fax: [phone]     Social Determinants of Health (SDOH) Social History: SDOH Screenings   Food Insecurity: No Food Insecurity (09/08/2022)  Housing: Low Risk  (09/08/2022)  Transportation Needs: No Transportation Needs (09/08/2022)  Utilities: Not At Risk (09/08/2022)  Tobacco Use: Low Risk  (09/08/2022)   SDOH  Interventions:     Readmission Risk Interventions     No data to display

## 2022-09-12 ENCOUNTER — Encounter: Payer: Self-pay | Admitting: Adult Health

## 2022-09-12 ENCOUNTER — Non-Acute Institutional Stay (SKILLED_NURSING_FACILITY): Payer: Medicare Other | Admitting: Adult Health

## 2022-09-12 ENCOUNTER — Other Ambulatory Visit (HOSPITAL_COMMUNITY)
Admission: RE | Admit: 2022-09-12 | Discharge: 2022-09-12 | Disposition: A | Discharge status: Home / Self Care | Payer: Medicare Other | Source: Skilled Nursing Facility | Attending: *Deleted | Admitting: *Deleted

## 2022-09-12 DIAGNOSIS — K219 Gastro-esophageal reflux disease without esophagitis: Secondary | ICD-10-CM | POA: Diagnosis not present

## 2022-09-12 DIAGNOSIS — I110 Hypertensive heart disease with heart failure: Secondary | ICD-10-CM | POA: Diagnosis not present

## 2022-09-12 DIAGNOSIS — I5032 Chronic diastolic (congestive) heart failure: Secondary | ICD-10-CM

## 2022-09-12 DIAGNOSIS — E785 Hyperlipidemia, unspecified: Secondary | ICD-10-CM

## 2022-09-12 DIAGNOSIS — M47816 Spondylosis without myelopathy or radiculopathy, lumbar region: Secondary | ICD-10-CM | POA: Diagnosis not present

## 2022-09-12 DIAGNOSIS — J4540 Moderate persistent asthma, uncomplicated: Secondary | ICD-10-CM

## 2022-09-12 DIAGNOSIS — K449 Diaphragmatic hernia without obstruction or gangrene: Secondary | ICD-10-CM | POA: Diagnosis not present

## 2022-09-12 DIAGNOSIS — S329XXS Fracture of unspecified parts of lumbosacral spine and pelvis, sequela: Secondary | ICD-10-CM | POA: Diagnosis not present

## 2022-09-12 DIAGNOSIS — E890 Postprocedural hypothyroidism: Secondary | ICD-10-CM | POA: Diagnosis not present

## 2022-09-12 DIAGNOSIS — I700 Atherosclerosis of aorta: Secondary | ICD-10-CM | POA: Diagnosis not present

## 2022-09-12 DIAGNOSIS — F339 Major depressive disorder, recurrent, unspecified: Secondary | ICD-10-CM

## 2022-09-12 LAB — BASIC METABOLIC PANEL
Anion gap: 6 (ref 5–15)
BUN: 21 mg/dL (ref 8–23)
CO2: 24 mmol/L (ref 22–32)
Calcium: 9 mg/dL (ref 8.9–10.3)
Chloride: 103 mmol/L (ref 98–111)
Creatinine, Ser: 0.79 mg/dL (ref 0.44–1.00)
GFR, Estimated: >60 mL/min (ref >60)
Glucose, Bld: 82 mg/dL (ref 70–99)
Potassium: 3.9 mmol/L (ref 3.5–5.1)
Sodium: 133 mmol/L — ABNORMAL LOW (ref 135–145)

## 2022-09-12 LAB — CBC
HCT: 29.8 % — ABNORMAL LOW (ref 36.0–46.0)
Hemoglobin: 9.8 g/dL — ABNORMAL LOW (ref 12.0–15.0)
MCH: 31.8 pg (ref 26.0–34.0)
MCHC: 32.9 g/dL (ref 30.0–36.0)
MCV: 96.8 fL (ref 80.0–100.0)
Platelets: 265 10*3/uL (ref 150–400)
RBC: 3.08 MIL/uL — ABNORMAL LOW (ref 3.87–5.11)
RDW: 13.2 % (ref 11.5–15.5)
WBC: 7.2 10*3/uL (ref 4.0–10.5)
nRBC: 0 % (ref 0.0–0.2)

## 2022-09-12 NOTE — Progress Notes (Signed)
Location:  [facility] Room Number: NO/128/P Place of Service:  SNF (31) Lindsey Innocent S.,NP  CODE STATUS: DNR  No Known Allergies  Chief Complaint  Patient presents with   Follow-up    Patient is here for follow up after hospital stay     HPI:  She is a 89 year old woman who has been hospitalized from 09-08-22 through 09-09-22. Her medication includes: GERD hyperlipidemia hypothyroidism; left hip fracture. She presented to the ED after a fall at home. She was found to have a pelvic fracture. Her vision is poor; legally blind. She fell trying to getting up from a chair. For her pelvic fracture orthopedics was consulted who recommended no surgical intervention. She will need therapy to improve upon her level of independence with her adls. She is here for short term rehab with her goal to return back home. She does have pain with movement. She will continue to be followed for her chronic illnesses including: Chronic diastolic congestive heart failure:  Benign hypertension with coincident congestive heart failure:  Aortic atherosclerosis  Moderate persistent asthma without complications:    Past Medical History:  Diagnosis Date   Actinic keratosis    Asthma    GERD (gastroesophageal reflux disease)    Hyperlipidemia    Hypothyroidism    Osteoarthritis     Past Surgical History:  Procedure Laterality Date   CATARACT EXTRACTION     left and right   HIP ARTHROPLASTY Left 10/04/2019   Procedure: ARTHROPLASTY BIPOLAR HIP (HEMIARTHROPLASTY);  Surgeon: Vickki Hearing, MD;  Location: [facility];  Service: Orthopedics;  Laterality: Left;   THYROIDECTOMY     TONSILLECTOMY     TOTAL KNEE ARTHROPLASTY     right and left knee   VAGINAL HYSTERECTOMY      Social History   Socioeconomic History   Marital status: Widowed    Spouse name: Not on file   Number of children: Not on file   Years of education: Not on file   Highest education level: Not on file   Occupational History   Occupation: retired  Tobacco Use   Smoking status: Never   Smokeless tobacco: Never  Substance and Sexual Activity   Alcohol use: No   Drug use: No   Sexual activity: Not on file  Other Topics Concern   Not on file  Social History Narrative   Not on file   Social Determinants of Health   Financial Resource Strain: Not on file  Food Insecurity: No Food Insecurity (09/08/2022)   Hunger Vital Sign    Worried About Running Out of Food in the Last Year: Never true    Ran Out of Food in the Last Year: Never true  Transportation Needs: No Transportation Needs (09/08/2022)   PRAPARE - Administrator, Civil Service (Medical): No    Lack of Transportation (Non-Medical): No  Physical Activity: Not on file  Stress: Not on file  Social Connections: Not on file  Intimate Partner Violence: Not At Risk (09/08/2022)   Humiliation, Afraid, Rape, and Kick questionnaire    Fear of Current or Ex-Partner: No    Emotionally Abused: No    Physically Abused: No    Sexually Abused: No   Family History  Problem Relation Age of Onset   Cancer Son    Liver cancer Brother       VITAL SIGNS BP 130/64   Pulse 82   Temp 98 F (36.7 C)   Resp 20  Ht 5\' 7"  (1.702 m)   Wt 132 lb (59.9 kg)   SpO2 95%   BMI 20.67 kg/m   Outpatient Encounter Medications as of 09/12/2022  Medication Sig   acetaminophen (TYLENOL) 500 MG tablet Take 2 tablets (1,000 mg total) by mouth every 8 (eight) hours.   albuterol (VENTOLIN HFA) 108 (90 Base) MCG/ACT inhaler Inhale 2 puffs into the lungs every 4 (four) hours as needed for wheezing or shortness of breath.   budesonide (PULMICORT) 0.5 MG/2ML nebulizer solution Take 2 mLs (0.5 mg total) by nebulization 2 (two) times daily.   cholecalciferol (VITAMIN D) 25 MCG (1000 UNIT) tablet Take 1,000 Units by mouth daily.   diclofenac Sodium (VOLTAREN) 1 % GEL Apply 2 g topically 4 (four) times daily.   furosemide (LASIX) 20 MG tablet Take 1  tablet (20 mg total) by mouth daily as needed for fluid or edema.   ipratropium-albuterol (DUONEB) 0.5-2.5 (3) MG/3ML SOLN Take 3 mLs by nebulization every 6 (six) hours as needed (SOB).   levothyroxine (SYNTHROID) 100 MCG tablet Take 100 mcg by mouth daily.   losartan (COZAAR) 50 MG tablet Take 50 mg by mouth daily.   methocarbamol (ROBAXIN) 500 MG tablet Take 1 tablet (500 mg total) by mouth every 8 (eight) hours as needed for muscle spasms.   montelukast (SINGULAIR) 10 MG tablet Take 1 tablet (10 mg total) by mouth at bedtime.   oxyCODONE (OXY IR/ROXICODONE) 5 MG immediate release tablet Take 1-2 tablets (5-10 mg total) by mouth every 6 (six) hours as needed for severe pain.   pantoprazole (PROTONIX) 40 MG tablet Take 40 mg by mouth daily.   polyethylene glycol (MIRALAX / GLYCOLAX) 17 g packet Take 17 g by mouth daily as needed for mild constipation.   predniSONE (DELTASONE) 5 MG tablet Take 5 mg by mouth daily.   rosuvastatin (CRESTOR) 10 MG tablet Take 10 mg by mouth at bedtime.   senna (SENOKOT) 8.6 MG TABS tablet Take 1 tablet (8.6 mg total) by mouth 2 (two) times daily. Hold for diarrhea.   sertraline (ZOLOFT) 50 MG tablet Take 50 mg by mouth daily.   SPIRIVA HANDIHALER 18 MCG inhalation capsule 1 capsule daily.   TRELEGY ELLIPTA 100-62.5-25 MCG/ACT AEPB Inhale 1 puff into the lungs daily.   vitamin B-12 (CYANOCOBALAMIN) 1000 MCG tablet Take 1,000 mcg by mouth daily.   No facility-administered encounter medications on file as of 09/12/2022.     SIGNIFICANT DIAGNOSTIC EXAMS  TODAY  09-08-22 pelvic CT 1. Acute comminuted fracture of the left inferior pubic ramus. 2. Acute transverse fracture through the posteroinferior aspect of the left ischium. Aortic Atherosclerosis  LABS REVIEWED:   09-08-22: wbc 11.8; hgb 12.0; hct 37.3; mcv 96.9 plt 324; glucose 120; bun 16; creat 0.87; k+ 3.7; na++ 135; ca 9.5; gfr >60 09-09-22: wbc 11.2; hgb 11.6; hct 36.4; mcv 101.7 plt 289; glucose 100; bun  15; creat 0.82; k+ 3.7; na++ 133; ca 8.8; gfr >60.    Review of Systems  Constitutional:  Negative for malaise/fatigue.  Respiratory:  Negative for cough and shortness of breath.   Cardiovascular:  Negative for chest pain, palpitations and leg swelling.  Gastrointestinal:  Negative for abdominal pain, constipation and heartburn.  Musculoskeletal:  Negative for back pain, joint pain and myalgias.  Skin: Negative.   Neurological:  Negative for dizziness.  Psychiatric/Behavioral:  The patient is not nervous/anxious.     Physical Exam Constitutional:      General: She is not in acute distress.  Appearance: She is well-developed. She is not diaphoretic.  Neck:     Thyroid: No thyromegaly.  Cardiovascular:     Rate and Rhythm: Normal rate and regular rhythm.     Pulses: Normal pulses.     Heart sounds: Normal heart sounds.  Pulmonary:     Effort: Pulmonary effort is normal. No respiratory distress.     Breath sounds: Normal breath sounds.  Abdominal:     General: Bowel sounds are normal. There is no distension.     Palpations: Abdomen is soft.     Tenderness: There is no abdominal tenderness.  Musculoskeletal:        General: Normal range of motion.     Cervical back: Neck supple.     Right lower leg: No edema.     Left lower leg: No edema.  Lymphadenopathy:     Cervical: No cervical adenopathy.  Skin:    General: Skin is warm and dry.  Neurological:     Mental Status: She is alert and oriented to person, place, and time.  Psychiatric:        Mood and Affect: Mood normal.      ASSESSMENT/ PLAN:  TODAY  Closed nondisplaced fracture of pelvis unspecified part of pelvis; sequela: will continue therapy as directed to improve upon level of independence with her adls. Will continue oxycodone 5 mg every 6 hours as needed and will begin meloxicam 15 mg daily will stop voltaren gel. Has robaxin 500 mg every 8 hours as needed for spasms   2. Chronic diastolic congestive heart  failure: will weigh daily; if gain 2 pounds in one day or 3 pounds in one week will give lasix 20 mg daily.   3. Benign hypertension with coincident congestive heart failure: b/p 130/64 will continue cozaar 50 mg daily   4. Aortic atherosclerosis (09-08-22 ct): is on statin  5. Moderate persistent asthma without complications: will continue spiriva 18 mcg daily trelegy 100-62.5-25 mcg daily pulmicort neb treatment 0.5 mg twice daily has albuterol 1 puffs every 4 hours as needed; has duoneb every 6 hours as needed  singulair 10 mg daily prednisone 5 mg daily   6. Hiatal hernia with GERD without esophagitis: will continue protonix 40 mg daily   7. Post operative hypothyroidism: will continue synthroid 88 mcg daily   8. Lumbar spondylosis: will continue tylenol 1 gm three times daily   9.  Hyperlipidemia ldl goal <130: will continue crestor 10 mg daily   10. Major depression recurrent chronic: will continue zoloft 50 mg daily   11. Chronic constipation: will continue senna twice daily has miralax daily as needed    Lindsey Innocent NP [facility] Adult Medicine  call [phone]

## 2022-09-13 ENCOUNTER — Encounter: Payer: Self-pay | Admitting: Internal Medicine

## 2022-09-13 ENCOUNTER — Non-Acute Institutional Stay (SKILLED_NURSING_FACILITY): Payer: Medicare Other | Admitting: Internal Medicine

## 2022-09-13 ENCOUNTER — Other Ambulatory Visit: Payer: Self-pay | Admitting: *Deleted

## 2022-09-13 DIAGNOSIS — S329XXS Fracture of unspecified parts of lumbosacral spine and pelvis, sequela: Secondary | ICD-10-CM

## 2022-09-13 DIAGNOSIS — I700 Atherosclerosis of aorta: Secondary | ICD-10-CM | POA: Insufficient documentation

## 2022-09-13 DIAGNOSIS — M8000XG Age-related osteoporosis with current pathological fracture, unspecified site, subsequent encounter for fracture with delayed healing: Secondary | ICD-10-CM | POA: Diagnosis not present

## 2022-09-13 DIAGNOSIS — J4540 Moderate persistent asthma, uncomplicated: Secondary | ICD-10-CM

## 2022-09-13 DIAGNOSIS — I110 Hypertensive heart disease with heart failure: Secondary | ICD-10-CM | POA: Insufficient documentation

## 2022-09-13 DIAGNOSIS — E785 Hyperlipidemia, unspecified: Secondary | ICD-10-CM | POA: Insufficient documentation

## 2022-09-13 DIAGNOSIS — I5032 Chronic diastolic (congestive) heart failure: Secondary | ICD-10-CM | POA: Insufficient documentation

## 2022-09-13 NOTE — Patient Outreach (Signed)
Mrs. Lindsey Butler recently admitted to [facility] skilled facility. Screening for potential [facility] care coordination services as benefit of health plan and Primary Care Provider.   Collaboration with Acupuncturist. Mrs. Lindsey Butler family is interested in long term care.   Will continue to follow for transition plans.   Marthenia Rolling, MSN, RN,BSN [place] Acute Care Coordinator [phone] (Direct dial)

## 2022-09-13 NOTE — Progress Notes (Unsigned)
   NURSING HOME LOCATION:  [facility] ROOM NUMBER:  128 P  CODE STATUS:  DNR  PCP: Stoney Bang MD  This is a comprehensive admission note to this SNFperformed on this date less than 30 days from date of admission. Included are preadmission medical/surgical history; reconciled medication list; family history; social history and comprehensive review of systems.  Corrections and additions to the records were documented. Comprehensive physical exam was also performed. Additionally a clinical summary was entered for each active diagnosis pertinent to this admission in the Problem List to enhance continuity of care.  HPI:  Past medical and surgical history:  Social history:  Family history:   Review of systems: Clinical neurocognitive deficits made validity of responses questionable & preventing ROS completion. Date given as Constitutional: No fever, significant weight change, fatigue  Eyes: No redness, discharge, pain, vision change ENT/mouth: No nasal congestion, purulent discharge, earache, change in hearing, sore throat  Cardiovascular: No chest pain, palpitations, paroxysmal nocturnal dyspnea, claudication, edema  Respiratory: No cough, sputum production, hemoptysis, DOE, significant snoring, apnea Gastrointestinal: No heartburn, dysphagia, abdominal pain, nausea /vomiting, rectal bleeding, melena, change in bowels Genitourinary: No dysuria, hematuria, pyuria, incontinence, nocturia Musculoskeletal: No joint stiffness, joint swelling, weakness, pain Dermatologic: No rash, pruritus, change in appearance of skin Neurologic: No dizziness, headache, syncope, seizures, numbness, tingling Psychiatric: No significant anxiety, depression, insomnia, anorexia Endocrine: No change in hair/skin/nails, excessive thirst, excessive hunger, excessive urination  Hematologic/lymphatic: No significant bruising, lymphadenopathy, abnormal bleeding Allergy/immunology: No itchy/watery  eyes, significant sneezing, urticaria, angioedema  Physical exam:  Pertinent or positive findings: General appearance: Adequately nourished; no acute distress, increased work of breathing is present.   Lymphatic: No lymphadenopathy about the head, neck, axilla. Eyes: No conjunctival inflammation or lid edema is present. There is no scleral icterus. Ears:  External ear exam shows no significant lesions or deformities.   Nose:  External nasal examination shows no deformity or inflammation. Nasal mucosa are pink and moist without lesions, exudates Oral exam: Lips and gums are healthy appearing.There is no oropharyngeal erythema or exudate. Neck:  No thyromegaly, masses, tenderness noted.    Heart:  Normal rate and regular rhythm. S1 and S2 normal without gallop, murmur, click, rub.  Lungs: Chest clear to auscultation without wheezes, rhonchi, rales, rubs. Abdomen: Bowel sounds are normal.  Abdomen is soft and nontender with no organomegaly, hernias, masses. GU: Deferred  Extremities:  No cyanosis, clubbing, edema. Neurologic exam:  Strength equal  in upper & lower extremities. Balance, Rhomberg, finger to nose testing could not be completed due to clinical state Deep tendon reflexes are equal Skin: Warm & dry w/o tenting. No significant lesions or rash.  See clinical summary under each active problem in the Problem List with associated updated therapeutic plan

## 2022-09-14 ENCOUNTER — Encounter: Payer: Self-pay | Admitting: Internal Medicine

## 2022-09-14 NOTE — Assessment & Plan Note (Signed)
Weightbearing and PT/OT at SNF as tolerated.  Robaxin will be discontinued as appropriate PT/OT is to improve balance and muscle tone and the Robaxin would be counterproductive.

## 2022-09-14 NOTE — Assessment & Plan Note (Signed)
Clinically her asthma is quiescent at this time.  O2 sats were 95% on room air.  No change in present pulmonary toilet protocol.

## 2022-09-14 NOTE — Assessment & Plan Note (Signed)
She is on prednisone, most likely for her reactive airways disease.  At 66 she is not a candidate for DEXA or osteoporotic medication.

## 2022-09-14 NOTE — Patient Instructions (Signed)
See assessment and plan under each diagnosis in the problem list and acutely for this visit 

## 2022-09-21 ENCOUNTER — Other Ambulatory Visit: Payer: Self-pay | Admitting: *Deleted

## 2022-09-21 NOTE — Patient Outreach (Signed)
[facility] Post- Acute Care Coordinator follow up. Per [facility] Mrs. Flack resides in [facility] skilled facility. Screening for potential care coordination services as benefit of health plan and Primary Care Provider.  Update received from [place], [place] social worker. [facility] is coming to assess today. Transition plan likely ALF.   Will follow for disposition and discharge date.  Marthenia Rolling, MSN, RN,BSN [place] Acute Care Coordinator [phone] (Direct dial)

## 2022-09-23 ENCOUNTER — Non-Acute Institutional Stay (SKILLED_NURSING_FACILITY): Payer: Medicare Other | Admitting: Adult Health

## 2022-09-23 ENCOUNTER — Encounter: Payer: Self-pay | Admitting: Adult Health

## 2022-09-23 DIAGNOSIS — I700 Atherosclerosis of aorta: Secondary | ICD-10-CM

## 2022-09-23 DIAGNOSIS — S329XXS Fracture of unspecified parts of lumbosacral spine and pelvis, sequela: Secondary | ICD-10-CM | POA: Diagnosis not present

## 2022-09-23 DIAGNOSIS — F339 Major depressive disorder, recurrent, unspecified: Secondary | ICD-10-CM | POA: Diagnosis not present

## 2022-09-23 NOTE — Progress Notes (Signed)
Location:  [place] Room Number: NO/128/P Place of Service:  SNF (31)   CODE STATUS: dnr   No Known Allergies  Chief Complaint  Patient presents with   Acute Visit    Patient is here for  care plan meeting    HPI:  We have come together for her care plan meeting. Family present.  BIMS 7/15 mood 5/30: decreased energy; trouble concentrating; nervous. She is nonambulatory no falls since admission. She requires dependent assist for her adl care. She is occasionally incontinent of bladder and frequently incontinent of bowel. Dietary: setup for meals; NAS with finger food weight 131.7 pounds appetite: fair has supplement.  Therapy: impaired cognition no SLUMS due to vision. Sit to stand contact guard; contact to min assist from chair to bed; ambulate 30-40 feet with min assist recommend use a walker; upper body supervision; lower body mod assist BRP min assist . Activities: gospel music. She continues to be followed for her chronic illnesses including:  Closed nondisplaced fracture of pelvis; unspecified part of pelvis; sequela Major depression recurrent chronic  Aortic atherosclerosis.the assisted living has review her status; will come back for further assessment.    Past Medical History:  Diagnosis Date   Actinic keratosis    Asthma    GERD (gastroesophageal reflux disease)    Hyperlipidemia    Hypothyroidism    Osteoarthritis     Past Surgical History:  Procedure Laterality Date   CATARACT EXTRACTION     left and right   HIP ARTHROPLASTY Left 10/04/2019   Procedure: ARTHROPLASTY BIPOLAR HIP (HEMIARTHROPLASTY);  Surgeon: Carole Civil, MD;  Location: [facility];  Service: Orthopedics;  Laterality: Left;   THYROIDECTOMY     TONSILLECTOMY     TOTAL KNEE ARTHROPLASTY     right and left knee   VAGINAL HYSTERECTOMY      Social History   Socioeconomic History   Marital status: Widowed    Spouse name: Not on file   Number of children: Not on file    Years of education: Not on file   Highest education level: Not on file  Occupational History   Occupation: retired  Tobacco Use   Smoking status: Never   Smokeless tobacco: Never  Substance and Sexual Activity   Alcohol use: No   Drug use: No   Sexual activity: Not on file  Other Topics Concern   Not on file  Social History Narrative   Not on file   Social Determinants of Health   Financial Resource Strain: Not on file  Food Insecurity: No Food Insecurity (09/08/2022)   Hunger Vital Sign    Worried About Running Out of Food in the Last Year: Never true    [place] in the Last Year: Never true  Transportation Needs: No Transportation Needs (09/08/2022)   PRAPARE - Hydrologist (Medical): No    Lack of Transportation (Non-Medical): No  Physical Activity: Not on file  Stress: Not on file  Social Connections: Not on file  Intimate Partner Violence: Not At Risk (09/08/2022)   Humiliation, Afraid, Rape, and Kick questionnaire    Fear of Current or Ex-Partner: No    Emotionally Abused: No    Physically Abused: No    Sexually Abused: No   Family History  Problem Relation Age of Onset   Cancer Son    Liver cancer Brother       VITAL SIGNS BP 110/62   Pulse  76   Temp (!) 97 F (36.1 C)   Resp 20   Ht 5' 7"$  (1.702 m)   Wt 132 lb 6.4 oz (60.1 kg)   SpO2 97%   BMI 20.74 kg/m   Outpatient Encounter Medications as of 09/23/2022  Medication Sig   acetaminophen (TYLENOL) 500 MG tablet Take 2 tablets (1,000 mg total) by mouth every 8 (eight) hours.   albuterol (VENTOLIN HFA) 108 (90 Base) MCG/ACT inhaler Inhale 2 puffs into the lungs every 4 (four) hours as needed for wheezing or shortness of breath.   budesonide (PULMICORT) 0.5 MG/2ML nebulizer solution Take 2 mLs (0.5 mg total) by nebulization 2 (two) times daily.   cholecalciferol (VITAMIN D) 25 MCG (1000 UNIT) tablet Take 1,000 Units by mouth daily.   diclofenac Sodium (VOLTAREN) 1 % GEL  Apply 2 g topically 4 (four) times daily.   furosemide (LASIX) 20 MG tablet Take 1 tablet (20 mg total) by mouth daily as needed for fluid or edema.   ipratropium-albuterol (DUONEB) 0.5-2.5 (3) MG/3ML SOLN Take 3 mLs by nebulization every 6 (six) hours as needed (SOB).   levothyroxine (SYNTHROID) 100 MCG tablet Take 100 mcg by mouth daily.   losartan (COZAAR) 50 MG tablet Take 50 mg by mouth daily.   methocarbamol (ROBAXIN) 500 MG tablet Take 1 tablet (500 mg total) by mouth every 8 (eight) hours as needed for muscle spasms.   montelukast (SINGULAIR) 10 MG tablet Take 1 tablet (10 mg total) by mouth at bedtime.   oxyCODONE (OXY IR/ROXICODONE) 5 MG immediate release tablet Take 1-2 tablets (5-10 mg total) by mouth every 6 (six) hours as needed for severe pain.   pantoprazole (PROTONIX) 40 MG tablet Take 40 mg by mouth daily.   polyethylene glycol (MIRALAX / GLYCOLAX) 17 g packet Take 17 g by mouth daily as needed for mild constipation.   predniSONE (DELTASONE) 5 MG tablet Take 5 mg by mouth daily.   rosuvastatin (CRESTOR) 10 MG tablet Take 10 mg by mouth at bedtime.   senna (SENOKOT) 8.6 MG TABS tablet Take 1 tablet (8.6 mg total) by mouth 2 (two) times daily. Hold for diarrhea.   sertraline (ZOLOFT) 50 MG tablet Take 50 mg by mouth daily.   SPIRIVA HANDIHALER 18 MCG inhalation capsule 1 capsule daily.   TRELEGY ELLIPTA 100-62.5-25 MCG/ACT AEPB Inhale 1 puff into the lungs daily.   vitamin B-12 (CYANOCOBALAMIN) 1000 MCG tablet Take 1,000 mcg by mouth daily.   No facility-administered encounter medications on file as of 09/23/2022.     SIGNIFICANT DIAGNOSTIC EXAMS  TODAY  09-08-22 pelvic CT 1. Acute comminuted fracture of the left inferior pubic ramus. 2. Acute transverse fracture through the posteroinferior aspect of the left ischium. Aortic Atherosclerosis  LABS REVIEWED:   09-08-22: wbc 11.8; hgb 12.0; hct 37.3; mcv 96.9 plt 324; glucose 120; bun 16; creat 0.87; k+ 3.7; na++ 135; ca 9.5;  gfr >60 09-09-22: wbc 11.2; hgb 11.6; hct 36.4; mcv 101.7 plt 289; glucose 100; bun 15; creat 0.82; k+ 3.7; na++ 133; ca 8.8; gfr >60.   Review of Systems  Constitutional:  Negative for malaise/fatigue.  Respiratory:  Negative for cough and shortness of breath.   Cardiovascular:  Negative for chest pain, palpitations and leg swelling.  Gastrointestinal:  Negative for abdominal pain, constipation and heartburn.  Musculoskeletal:  Negative for back pain, joint pain and myalgias.  Skin: Negative.   Neurological:  Negative for dizziness.  Psychiatric/Behavioral:  The patient is not nervous/anxious.  Physical Exam Constitutional:      General: She is not in acute distress.    Appearance: She is well-developed. She is not diaphoretic.  Neck:     Thyroid: No thyromegaly.  Cardiovascular:     Rate and Rhythm: Normal rate and regular rhythm.     Pulses: Normal pulses.     Heart sounds: Normal heart sounds.  Pulmonary:     Effort: Pulmonary effort is normal. No respiratory distress.     Breath sounds: Normal breath sounds.  Abdominal:     General: Bowel sounds are normal. There is no distension.     Palpations: Abdomen is soft.     Tenderness: There is no abdominal tenderness.  Musculoskeletal:        General: Normal range of motion.     Cervical back: Neck supple.     Right lower leg: No edema.     Left lower leg: No edema.  Lymphadenopathy:     Cervical: No cervical adenopathy.  Skin:    General: Skin is warm and dry.  Neurological:     Mental Status: She is alert. Mental status is at baseline.  Psychiatric:        Mood and Affect: Mood normal.        ASSESSMENT/ PLAN:  TODAY  Closed nondisplaced fracture of pelvis; unspecified part of pelvis; sequela Major depression recurrent chronic Aortic atherosclerosis   Will continue therapy as directed to improve upon her independence with her adls.  Will continue current medications Will continue to monitor her  status  Time spent with patient: 40 minutes: therapy; medications; goals of care     Ok Edwards NP [facility]  call [phone]

## 2022-09-26 ENCOUNTER — Encounter: Payer: Self-pay | Admitting: Adult Health

## 2022-09-26 ENCOUNTER — Non-Acute Institutional Stay (SKILLED_NURSING_FACILITY): Payer: Medicare Other | Admitting: Adult Health

## 2022-09-26 DIAGNOSIS — I700 Atherosclerosis of aorta: Secondary | ICD-10-CM | POA: Diagnosis not present

## 2022-09-26 DIAGNOSIS — I110 Hypertensive heart disease with heart failure: Secondary | ICD-10-CM

## 2022-09-26 DIAGNOSIS — I5032 Chronic diastolic (congestive) heart failure: Secondary | ICD-10-CM | POA: Diagnosis not present

## 2022-09-26 DIAGNOSIS — S329XXS Fracture of unspecified parts of lumbosacral spine and pelvis, sequela: Secondary | ICD-10-CM

## 2022-09-26 NOTE — Progress Notes (Signed)
Location:  [place] Room Number: 128 Place of Service:  SNF (31)   CODE STATUS: dnr   No Known Allergies  Chief Complaint  Patient presents with   Discharge Note    HPI:  She is being discharged to assist living with home health for pt/ot. She will not need any dme. The assisted living will provide her medications. She will medically follow up at the assisted living. She had been hospitalized for a pelvic fracture following a fall. She was admitted to this facility for short term rehab. Her stand/pivot are contact guard; bed mobility contact guard; can ambulate 40 feet with rolling walker with min assist; upper body stand by assist; lower body min assist; brp contact guard. Was on ST for cognition.   Past Medical History:  Diagnosis Date   Actinic keratosis    Asthma    GERD (gastroesophageal reflux disease)    Hyperlipidemia    Hypothyroidism    Osteoarthritis     Past Surgical History:  Procedure Laterality Date   CATARACT EXTRACTION     left and right   HIP ARTHROPLASTY Left 10/04/2019   Procedure: ARTHROPLASTY BIPOLAR HIP (HEMIARTHROPLASTY);  Surgeon: Carole Civil, MD;  Location: [facility];  Service: Orthopedics;  Laterality: Left;   THYROIDECTOMY     TONSILLECTOMY     TOTAL KNEE ARTHROPLASTY     right and left knee   VAGINAL HYSTERECTOMY      Social History   Socioeconomic History   Marital status: Widowed    Spouse name: Not on file   Number of children: Not on file   Years of education: Not on file   Highest education level: Not on file  Occupational History   Occupation: retired  Tobacco Use   Smoking status: Never   Smokeless tobacco: Never  Substance and Sexual Activity   Alcohol use: No   Drug use: No   Sexual activity: Not on file  Other Topics Concern   Not on file  Social History Narrative   Not on file   Social Determinants of Health   Financial Resource Strain: Not on file  Food Insecurity: No Food  Insecurity (09/08/2022)   Hunger Vital Sign    Worried About Running Out of Food in the Last Year: Never true    [employer] in the Last Year: Never true  Transportation Needs: No Transportation Needs (09/08/2022)   PRAPARE - Hydrologist (Medical): No    Lack of Transportation (Non-Medical): No  Physical Activity: Not on file  Stress: Not on file  Social Connections: Not on file  Intimate Partner Violence: Not At Risk (09/08/2022)   Humiliation, Afraid, Rape, and Kick questionnaire    Fear of Current or Ex-Partner: No    Emotionally Abused: No    Physically Abused: No    Sexually Abused: No   Family History  Problem Relation Age of Onset   Cancer Son    Liver cancer Brother       VITAL SIGNS BP 122/84   Pulse 79   Temp 98.2 F (36.8 C)   Resp 20   Ht '5\' 7"'$  (1.702 m)   Wt 130 lb 12.8 oz (59.3 kg)   SpO2 95%   BMI 20.49 kg/m   Outpatient Encounter Medications as of 09/26/2022  Medication Sig   acetaminophen (TYLENOL) 500 MG tablet Take 2 tablets (1,000 mg total) by mouth every 8 (eight) hours.  albuterol (VENTOLIN HFA) 108 (90 Base) MCG/ACT inhaler Inhale 2 puffs into the lungs every 4 (four) hours as needed for wheezing or shortness of breath.   budesonide (PULMICORT) 0.5 MG/2ML nebulizer solution Take 2 mLs (0.5 mg total) by nebulization 2 (two) times daily.   cholecalciferol (VITAMIN D) 25 MCG (1000 UNIT) tablet Take 1,000 Units by mouth daily.   diclofenac Sodium (VOLTAREN) 1 % GEL Apply 2 g topically 4 (four) times daily.   furosemide (LASIX) 20 MG tablet Take 1 tablet (20 mg total) by mouth daily as needed for fluid or edema.   ipratropium-albuterol (DUONEB) 0.5-2.5 (3) MG/3ML SOLN Take 3 mLs by nebulization every 6 (six) hours as needed (SOB).   levothyroxine (SYNTHROID) 100 MCG tablet Take 100 mcg by mouth daily.   losartan (COZAAR) 50 MG tablet Take 50 mg by mouth daily.   methocarbamol (ROBAXIN) 500 MG tablet Take 1 tablet (500 mg  total) by mouth every 8 (eight) hours as needed for muscle spasms.   montelukast (SINGULAIR) 10 MG tablet Take 1 tablet (10 mg total) by mouth at bedtime.   oxyCODONE (OXY IR/ROXICODONE) 5 MG immediate release tablet Take 1-2 tablets (5-10 mg total) by mouth every 6 (six) hours as needed for severe pain.   pantoprazole (PROTONIX) 40 MG tablet Take 40 mg by mouth daily.   polyethylene glycol (MIRALAX / GLYCOLAX) 17 g packet Take 17 g by mouth daily as needed for mild constipation.   predniSONE (DELTASONE) 5 MG tablet Take 5 mg by mouth daily.   rosuvastatin (CRESTOR) 10 MG tablet Take 10 mg by mouth at bedtime.   senna (SENOKOT) 8.6 MG TABS tablet Take 1 tablet (8.6 mg total) by mouth 2 (two) times daily. Hold for diarrhea.   sertraline (ZOLOFT) 50 MG tablet Take 50 mg by mouth daily.   SPIRIVA HANDIHALER 18 MCG inhalation capsule 1 capsule daily.   TRELEGY ELLIPTA 100-62.5-25 MCG/ACT AEPB Inhale 1 puff into the lungs daily.   vitamin B-12 (CYANOCOBALAMIN) 1000 MCG tablet Take 1,000 mcg by mouth daily.   No facility-administered encounter medications on file as of 09/26/2022.     SIGNIFICANT DIAGNOSTIC EXAMS   TODAY  09-08-22 pelvic CT 1. Acute comminuted fracture of the left inferior pubic ramus. 2. Acute transverse fracture through the posteroinferior aspect of the left ischium. Aortic Atherosclerosis  LABS REVIEWED:   09-08-22: wbc 11.8; hgb 12.0; hct 37.3; mcv 96.9 plt 324; glucose 120; bun 16; creat 0.87; k+ 3.7; na++ 135; ca 9.5; gfr >60 09-09-22: wbc 11.2; hgb 11.6; hct 36.4; mcv 101.7 plt 289; glucose 100; bun 15; creat 0.82; k+ 3.7; na++ 133; ca 8.8; gfr >60.   Review of Systems  Constitutional:  Negative for malaise/fatigue.  Respiratory:  Negative for cough and shortness of breath.   Cardiovascular:  Negative for chest pain, palpitations and leg swelling.  Gastrointestinal:  Negative for abdominal pain, constipation and heartburn.  Musculoskeletal:  Negative for back pain,  joint pain and myalgias.  Skin: Negative.   Neurological:  Negative for dizziness.  Psychiatric/Behavioral:  The patient is not nervous/anxious.    Physical Exam Constitutional:      General: She is not in acute distress.    Appearance: She is well-developed. She is not diaphoretic.  Neck:     Thyroid: No thyromegaly.  Cardiovascular:     Rate and Rhythm: Normal rate and regular rhythm.     Pulses: Normal pulses.     Heart sounds: Normal heart sounds.  Pulmonary:  Effort: Pulmonary effort is normal. No respiratory distress.     Breath sounds: Normal breath sounds.  Abdominal:     General: Bowel sounds are normal. There is no distension.     Palpations: Abdomen is soft.     Tenderness: There is no abdominal tenderness.  Musculoskeletal:        General: Normal range of motion.     Cervical back: Neck supple.     Right lower leg: No edema.     Left lower leg: No edema.  Lymphadenopathy:     Cervical: No cervical adenopathy.  Skin:    General: Skin is warm and dry.  Neurological:     Mental Status: She is alert. Mental status is at baseline.  Psychiatric:        Mood and Affect: Mood normal.       ASSESSMENT/ PLAN:   Patient is being discharged with the following home health services:  pt/ot to evaluate and treat as indicated for gait balance strength adl training.   Patient is being discharged with the following durable medical equipment:  none needed   Patient has been advised to f/u with their PCP in 1-2 weeks to for a transitions of care visit.  Social services at their facility was responsible for arranging this appointment.  Pt was provided with adequate prescriptions of noncontrolled medications to reach the scheduled appointment .  For controlled substances, a limited supply was provided as appropriate for the individual patient.  If the pt normally receives these medications from a pain clinic or has a contract with another physician, these medications should be  received from that clinic or physician only).     Her medications will be provided by the receiving facility   Ok Edwards NP [facility]  call [phone]

## 2022-09-28 ENCOUNTER — Other Ambulatory Visit: Payer: Self-pay | Admitting: *Deleted

## 2022-09-28 NOTE — Patient Outreach (Signed)
[place] Coordinator follow up. Mrs. Guggisberg resides in [facility]. Screening for potential [facility] care coordination services as benefit of health plan and PCP.   Update received from [place], [place] social worker. Mrs. Weger is slated to transition to [facility] on 10/03/22.  No identifiable [facility] care coordination needs.   Marthenia Rolling, MSN, RN,BSN [place] Acute Care Coordinator [phone] (Direct dial)

## 2022-10-18 DIAGNOSIS — Z96642 Presence of left artificial hip joint: Secondary | ICD-10-CM | POA: Diagnosis not present

## 2022-10-18 DIAGNOSIS — E890 Postprocedural hypothyroidism: Secondary | ICD-10-CM | POA: Diagnosis not present

## 2022-10-18 DIAGNOSIS — E785 Hyperlipidemia, unspecified: Secondary | ICD-10-CM | POA: Diagnosis not present

## 2022-10-18 DIAGNOSIS — I700 Atherosclerosis of aorta: Secondary | ICD-10-CM | POA: Diagnosis not present

## 2022-10-18 DIAGNOSIS — S32592D Other specified fracture of left pubis, subsequent encounter for fracture with routine healing: Secondary | ICD-10-CM | POA: Diagnosis not present

## 2022-10-18 DIAGNOSIS — I110 Hypertensive heart disease with heart failure: Secondary | ICD-10-CM | POA: Diagnosis not present

## 2022-10-18 DIAGNOSIS — K219 Gastro-esophageal reflux disease without esophagitis: Secondary | ICD-10-CM | POA: Diagnosis not present

## 2022-10-18 DIAGNOSIS — Z9181 History of falling: Secondary | ICD-10-CM | POA: Diagnosis not present

## 2022-10-18 DIAGNOSIS — Z7951 Long term (current) use of inhaled steroids: Secondary | ICD-10-CM | POA: Diagnosis not present

## 2022-10-18 DIAGNOSIS — Z791 Long term (current) use of non-steroidal anti-inflammatories (NSAID): Secondary | ICD-10-CM | POA: Diagnosis not present

## 2022-10-18 DIAGNOSIS — H3530 Unspecified macular degeneration: Secondary | ICD-10-CM | POA: Diagnosis not present

## 2022-10-18 DIAGNOSIS — Z96653 Presence of artificial knee joint, bilateral: Secondary | ICD-10-CM | POA: Diagnosis not present

## 2022-10-18 DIAGNOSIS — J45909 Unspecified asthma, uncomplicated: Secondary | ICD-10-CM | POA: Diagnosis not present

## 2022-10-18 DIAGNOSIS — I5032 Chronic diastolic (congestive) heart failure: Secondary | ICD-10-CM | POA: Diagnosis not present

## 2022-10-18 DIAGNOSIS — M1990 Unspecified osteoarthritis, unspecified site: Secondary | ICD-10-CM | POA: Diagnosis not present

## 2022-10-25 DIAGNOSIS — J4540 Moderate persistent asthma, uncomplicated: Secondary | ICD-10-CM | POA: Diagnosis not present

## 2022-10-25 DIAGNOSIS — E782 Mixed hyperlipidemia: Secondary | ICD-10-CM | POA: Diagnosis not present

## 2022-10-25 DIAGNOSIS — K219 Gastro-esophageal reflux disease without esophagitis: Secondary | ICD-10-CM | POA: Diagnosis not present

## 2022-10-25 DIAGNOSIS — Z9181 History of falling: Secondary | ICD-10-CM | POA: Diagnosis not present

## 2022-10-25 DIAGNOSIS — Z96653 Presence of artificial knee joint, bilateral: Secondary | ICD-10-CM | POA: Diagnosis not present

## 2022-10-25 DIAGNOSIS — M1990 Unspecified osteoarthritis, unspecified site: Secondary | ICD-10-CM | POA: Diagnosis not present

## 2022-10-25 DIAGNOSIS — K2100 Gastro-esophageal reflux disease with esophagitis, without bleeding: Secondary | ICD-10-CM | POA: Diagnosis not present

## 2022-10-25 DIAGNOSIS — S32592D Other specified fracture of left pubis, subsequent encounter for fracture with routine healing: Secondary | ICD-10-CM | POA: Diagnosis not present

## 2022-10-25 DIAGNOSIS — E038 Other specified hypothyroidism: Secondary | ICD-10-CM | POA: Diagnosis not present

## 2022-10-25 DIAGNOSIS — E890 Postprocedural hypothyroidism: Secondary | ICD-10-CM | POA: Diagnosis not present

## 2022-10-25 DIAGNOSIS — J45909 Unspecified asthma, uncomplicated: Secondary | ICD-10-CM | POA: Diagnosis not present

## 2022-10-25 DIAGNOSIS — Z791 Long term (current) use of non-steroidal anti-inflammatories (NSAID): Secondary | ICD-10-CM | POA: Diagnosis not present

## 2022-10-25 DIAGNOSIS — I10 Essential (primary) hypertension: Secondary | ICD-10-CM | POA: Diagnosis not present

## 2022-10-25 DIAGNOSIS — I5032 Chronic diastolic (congestive) heart failure: Secondary | ICD-10-CM | POA: Diagnosis not present

## 2022-10-25 DIAGNOSIS — E785 Hyperlipidemia, unspecified: Secondary | ICD-10-CM | POA: Diagnosis not present

## 2022-10-25 DIAGNOSIS — Z7951 Long term (current) use of inhaled steroids: Secondary | ICD-10-CM | POA: Diagnosis not present

## 2022-10-25 DIAGNOSIS — Z96642 Presence of left artificial hip joint: Secondary | ICD-10-CM | POA: Diagnosis not present

## 2022-10-25 DIAGNOSIS — H3530 Unspecified macular degeneration: Secondary | ICD-10-CM | POA: Diagnosis not present

## 2022-10-25 DIAGNOSIS — I110 Hypertensive heart disease with heart failure: Secondary | ICD-10-CM | POA: Diagnosis not present

## 2022-10-25 DIAGNOSIS — I700 Atherosclerosis of aorta: Secondary | ICD-10-CM | POA: Diagnosis not present

## 2022-11-01 DIAGNOSIS — Z96653 Presence of artificial knee joint, bilateral: Secondary | ICD-10-CM | POA: Diagnosis not present

## 2022-11-01 DIAGNOSIS — K219 Gastro-esophageal reflux disease without esophagitis: Secondary | ICD-10-CM | POA: Diagnosis not present

## 2022-11-01 DIAGNOSIS — Z7951 Long term (current) use of inhaled steroids: Secondary | ICD-10-CM | POA: Diagnosis not present

## 2022-11-01 DIAGNOSIS — Z9181 History of falling: Secondary | ICD-10-CM | POA: Diagnosis not present

## 2022-11-01 DIAGNOSIS — S32592D Other specified fracture of left pubis, subsequent encounter for fracture with routine healing: Secondary | ICD-10-CM | POA: Diagnosis not present

## 2022-11-01 DIAGNOSIS — E785 Hyperlipidemia, unspecified: Secondary | ICD-10-CM | POA: Diagnosis not present

## 2022-11-01 DIAGNOSIS — M1990 Unspecified osteoarthritis, unspecified site: Secondary | ICD-10-CM | POA: Diagnosis not present

## 2022-11-01 DIAGNOSIS — I110 Hypertensive heart disease with heart failure: Secondary | ICD-10-CM | POA: Diagnosis not present

## 2022-11-01 DIAGNOSIS — J45909 Unspecified asthma, uncomplicated: Secondary | ICD-10-CM | POA: Diagnosis not present

## 2022-11-01 DIAGNOSIS — E890 Postprocedural hypothyroidism: Secondary | ICD-10-CM | POA: Diagnosis not present

## 2022-11-01 DIAGNOSIS — I700 Atherosclerosis of aorta: Secondary | ICD-10-CM | POA: Diagnosis not present

## 2022-11-01 DIAGNOSIS — H3530 Unspecified macular degeneration: Secondary | ICD-10-CM | POA: Diagnosis not present

## 2022-11-01 DIAGNOSIS — I5032 Chronic diastolic (congestive) heart failure: Secondary | ICD-10-CM | POA: Diagnosis not present

## 2022-11-01 DIAGNOSIS — Z791 Long term (current) use of non-steroidal anti-inflammatories (NSAID): Secondary | ICD-10-CM | POA: Diagnosis not present

## 2022-11-01 DIAGNOSIS — Z96642 Presence of left artificial hip joint: Secondary | ICD-10-CM | POA: Diagnosis not present

## 2022-11-03 DIAGNOSIS — K219 Gastro-esophageal reflux disease without esophagitis: Secondary | ICD-10-CM | POA: Diagnosis not present

## 2022-11-03 DIAGNOSIS — Z9181 History of falling: Secondary | ICD-10-CM | POA: Diagnosis not present

## 2022-11-03 DIAGNOSIS — I110 Hypertensive heart disease with heart failure: Secondary | ICD-10-CM | POA: Diagnosis not present

## 2022-11-03 DIAGNOSIS — M1990 Unspecified osteoarthritis, unspecified site: Secondary | ICD-10-CM | POA: Diagnosis not present

## 2022-11-03 DIAGNOSIS — Z791 Long term (current) use of non-steroidal anti-inflammatories (NSAID): Secondary | ICD-10-CM | POA: Diagnosis not present

## 2022-11-03 DIAGNOSIS — I5032 Chronic diastolic (congestive) heart failure: Secondary | ICD-10-CM | POA: Diagnosis not present

## 2022-11-03 DIAGNOSIS — E785 Hyperlipidemia, unspecified: Secondary | ICD-10-CM | POA: Diagnosis not present

## 2022-11-03 DIAGNOSIS — I700 Atherosclerosis of aorta: Secondary | ICD-10-CM | POA: Diagnosis not present

## 2022-11-03 DIAGNOSIS — J45909 Unspecified asthma, uncomplicated: Secondary | ICD-10-CM | POA: Diagnosis not present

## 2022-11-03 DIAGNOSIS — H3530 Unspecified macular degeneration: Secondary | ICD-10-CM | POA: Diagnosis not present

## 2022-11-03 DIAGNOSIS — Z7951 Long term (current) use of inhaled steroids: Secondary | ICD-10-CM | POA: Diagnosis not present

## 2022-11-03 DIAGNOSIS — Z96642 Presence of left artificial hip joint: Secondary | ICD-10-CM | POA: Diagnosis not present

## 2022-11-03 DIAGNOSIS — Z96653 Presence of artificial knee joint, bilateral: Secondary | ICD-10-CM | POA: Diagnosis not present

## 2022-11-03 DIAGNOSIS — S32592D Other specified fracture of left pubis, subsequent encounter for fracture with routine healing: Secondary | ICD-10-CM | POA: Diagnosis not present

## 2022-11-03 DIAGNOSIS — E890 Postprocedural hypothyroidism: Secondary | ICD-10-CM | POA: Diagnosis not present

## 2022-11-07 DIAGNOSIS — Z96653 Presence of artificial knee joint, bilateral: Secondary | ICD-10-CM | POA: Diagnosis not present

## 2022-11-07 DIAGNOSIS — Z96642 Presence of left artificial hip joint: Secondary | ICD-10-CM | POA: Diagnosis not present

## 2022-11-07 DIAGNOSIS — H3530 Unspecified macular degeneration: Secondary | ICD-10-CM | POA: Diagnosis not present

## 2022-11-07 DIAGNOSIS — I110 Hypertensive heart disease with heart failure: Secondary | ICD-10-CM | POA: Diagnosis not present

## 2022-11-07 DIAGNOSIS — I5032 Chronic diastolic (congestive) heart failure: Secondary | ICD-10-CM | POA: Diagnosis not present

## 2022-11-07 DIAGNOSIS — Z9181 History of falling: Secondary | ICD-10-CM | POA: Diagnosis not present

## 2022-11-07 DIAGNOSIS — Z7951 Long term (current) use of inhaled steroids: Secondary | ICD-10-CM | POA: Diagnosis not present

## 2022-11-07 DIAGNOSIS — J45909 Unspecified asthma, uncomplicated: Secondary | ICD-10-CM | POA: Diagnosis not present

## 2022-11-07 DIAGNOSIS — E785 Hyperlipidemia, unspecified: Secondary | ICD-10-CM | POA: Diagnosis not present

## 2022-11-07 DIAGNOSIS — Z791 Long term (current) use of non-steroidal anti-inflammatories (NSAID): Secondary | ICD-10-CM | POA: Diagnosis not present

## 2022-11-07 DIAGNOSIS — E890 Postprocedural hypothyroidism: Secondary | ICD-10-CM | POA: Diagnosis not present

## 2022-11-07 DIAGNOSIS — S32592D Other specified fracture of left pubis, subsequent encounter for fracture with routine healing: Secondary | ICD-10-CM | POA: Diagnosis not present

## 2022-11-07 DIAGNOSIS — K219 Gastro-esophageal reflux disease without esophagitis: Secondary | ICD-10-CM | POA: Diagnosis not present

## 2022-11-07 DIAGNOSIS — M1990 Unspecified osteoarthritis, unspecified site: Secondary | ICD-10-CM | POA: Diagnosis not present

## 2022-11-07 DIAGNOSIS — I700 Atherosclerosis of aorta: Secondary | ICD-10-CM | POA: Diagnosis not present

## 2022-11-08 DIAGNOSIS — M1990 Unspecified osteoarthritis, unspecified site: Secondary | ICD-10-CM | POA: Diagnosis not present

## 2022-11-08 DIAGNOSIS — Z791 Long term (current) use of non-steroidal anti-inflammatories (NSAID): Secondary | ICD-10-CM | POA: Diagnosis not present

## 2022-11-08 DIAGNOSIS — R600 Localized edema: Secondary | ICD-10-CM | POA: Diagnosis not present

## 2022-11-08 DIAGNOSIS — I700 Atherosclerosis of aorta: Secondary | ICD-10-CM | POA: Diagnosis not present

## 2022-11-08 DIAGNOSIS — J45909 Unspecified asthma, uncomplicated: Secondary | ICD-10-CM | POA: Diagnosis not present

## 2022-11-08 DIAGNOSIS — Z9181 History of falling: Secondary | ICD-10-CM | POA: Diagnosis not present

## 2022-11-08 DIAGNOSIS — K219 Gastro-esophageal reflux disease without esophagitis: Secondary | ICD-10-CM | POA: Diagnosis not present

## 2022-11-08 DIAGNOSIS — I110 Hypertensive heart disease with heart failure: Secondary | ICD-10-CM | POA: Diagnosis not present

## 2022-11-08 DIAGNOSIS — S32592D Other specified fracture of left pubis, subsequent encounter for fracture with routine healing: Secondary | ICD-10-CM | POA: Diagnosis not present

## 2022-11-08 DIAGNOSIS — I5032 Chronic diastolic (congestive) heart failure: Secondary | ICD-10-CM | POA: Diagnosis not present

## 2022-11-08 DIAGNOSIS — Z96653 Presence of artificial knee joint, bilateral: Secondary | ICD-10-CM | POA: Diagnosis not present

## 2022-11-08 DIAGNOSIS — Z96642 Presence of left artificial hip joint: Secondary | ICD-10-CM | POA: Diagnosis not present

## 2022-11-08 DIAGNOSIS — H3530 Unspecified macular degeneration: Secondary | ICD-10-CM | POA: Diagnosis not present

## 2022-11-08 DIAGNOSIS — Z7951 Long term (current) use of inhaled steroids: Secondary | ICD-10-CM | POA: Diagnosis not present

## 2022-11-08 DIAGNOSIS — E785 Hyperlipidemia, unspecified: Secondary | ICD-10-CM | POA: Diagnosis not present

## 2022-11-08 DIAGNOSIS — E890 Postprocedural hypothyroidism: Secondary | ICD-10-CM | POA: Diagnosis not present

## 2022-11-10 DIAGNOSIS — J45909 Unspecified asthma, uncomplicated: Secondary | ICD-10-CM | POA: Diagnosis not present

## 2022-11-10 DIAGNOSIS — E890 Postprocedural hypothyroidism: Secondary | ICD-10-CM | POA: Diagnosis not present

## 2022-11-10 DIAGNOSIS — Z791 Long term (current) use of non-steroidal anti-inflammatories (NSAID): Secondary | ICD-10-CM | POA: Diagnosis not present

## 2022-11-10 DIAGNOSIS — M1990 Unspecified osteoarthritis, unspecified site: Secondary | ICD-10-CM | POA: Diagnosis not present

## 2022-11-10 DIAGNOSIS — Z96642 Presence of left artificial hip joint: Secondary | ICD-10-CM | POA: Diagnosis not present

## 2022-11-10 DIAGNOSIS — Z96653 Presence of artificial knee joint, bilateral: Secondary | ICD-10-CM | POA: Diagnosis not present

## 2022-11-10 DIAGNOSIS — K219 Gastro-esophageal reflux disease without esophagitis: Secondary | ICD-10-CM | POA: Diagnosis not present

## 2022-11-10 DIAGNOSIS — E785 Hyperlipidemia, unspecified: Secondary | ICD-10-CM | POA: Diagnosis not present

## 2022-11-10 DIAGNOSIS — H3530 Unspecified macular degeneration: Secondary | ICD-10-CM | POA: Diagnosis not present

## 2022-11-10 DIAGNOSIS — S32592D Other specified fracture of left pubis, subsequent encounter for fracture with routine healing: Secondary | ICD-10-CM | POA: Diagnosis not present

## 2022-11-10 DIAGNOSIS — I700 Atherosclerosis of aorta: Secondary | ICD-10-CM | POA: Diagnosis not present

## 2022-11-10 DIAGNOSIS — I110 Hypertensive heart disease with heart failure: Secondary | ICD-10-CM | POA: Diagnosis not present

## 2022-11-10 DIAGNOSIS — Z7951 Long term (current) use of inhaled steroids: Secondary | ICD-10-CM | POA: Diagnosis not present

## 2022-11-10 DIAGNOSIS — Z9181 History of falling: Secondary | ICD-10-CM | POA: Diagnosis not present

## 2022-11-10 DIAGNOSIS — I5032 Chronic diastolic (congestive) heart failure: Secondary | ICD-10-CM | POA: Diagnosis not present

## 2022-11-12 DIAGNOSIS — Z79899 Other long term (current) drug therapy: Secondary | ICD-10-CM | POA: Diagnosis not present

## 2022-11-12 DIAGNOSIS — R0602 Shortness of breath: Secondary | ICD-10-CM | POA: Diagnosis not present

## 2022-11-12 DIAGNOSIS — E559 Vitamin D deficiency, unspecified: Secondary | ICD-10-CM | POA: Diagnosis not present

## 2022-11-14 DIAGNOSIS — J45909 Unspecified asthma, uncomplicated: Secondary | ICD-10-CM | POA: Diagnosis not present

## 2022-11-14 DIAGNOSIS — I700 Atherosclerosis of aorta: Secondary | ICD-10-CM | POA: Diagnosis not present

## 2022-11-14 DIAGNOSIS — E890 Postprocedural hypothyroidism: Secondary | ICD-10-CM | POA: Diagnosis not present

## 2022-11-14 DIAGNOSIS — S32592D Other specified fracture of left pubis, subsequent encounter for fracture with routine healing: Secondary | ICD-10-CM | POA: Diagnosis not present

## 2022-11-14 DIAGNOSIS — M1990 Unspecified osteoarthritis, unspecified site: Secondary | ICD-10-CM | POA: Diagnosis not present

## 2022-11-14 DIAGNOSIS — Z7951 Long term (current) use of inhaled steroids: Secondary | ICD-10-CM | POA: Diagnosis not present

## 2022-11-14 DIAGNOSIS — Z9181 History of falling: Secondary | ICD-10-CM | POA: Diagnosis not present

## 2022-11-14 DIAGNOSIS — K219 Gastro-esophageal reflux disease without esophagitis: Secondary | ICD-10-CM | POA: Diagnosis not present

## 2022-11-14 DIAGNOSIS — H3530 Unspecified macular degeneration: Secondary | ICD-10-CM | POA: Diagnosis not present

## 2022-11-14 DIAGNOSIS — Z791 Long term (current) use of non-steroidal anti-inflammatories (NSAID): Secondary | ICD-10-CM | POA: Diagnosis not present

## 2022-11-14 DIAGNOSIS — E785 Hyperlipidemia, unspecified: Secondary | ICD-10-CM | POA: Diagnosis not present

## 2022-11-14 DIAGNOSIS — I5032 Chronic diastolic (congestive) heart failure: Secondary | ICD-10-CM | POA: Diagnosis not present

## 2022-11-14 DIAGNOSIS — Z96653 Presence of artificial knee joint, bilateral: Secondary | ICD-10-CM | POA: Diagnosis not present

## 2022-11-14 DIAGNOSIS — I110 Hypertensive heart disease with heart failure: Secondary | ICD-10-CM | POA: Diagnosis not present

## 2022-11-14 DIAGNOSIS — Z96642 Presence of left artificial hip joint: Secondary | ICD-10-CM | POA: Diagnosis not present

## 2022-11-15 DIAGNOSIS — I110 Hypertensive heart disease with heart failure: Secondary | ICD-10-CM | POA: Diagnosis not present

## 2022-11-15 DIAGNOSIS — I10 Essential (primary) hypertension: Secondary | ICD-10-CM | POA: Diagnosis not present

## 2022-11-15 DIAGNOSIS — J4540 Moderate persistent asthma, uncomplicated: Secondary | ICD-10-CM | POA: Diagnosis not present

## 2022-11-15 DIAGNOSIS — E782 Mixed hyperlipidemia: Secondary | ICD-10-CM | POA: Diagnosis not present

## 2022-11-15 DIAGNOSIS — E038 Other specified hypothyroidism: Secondary | ICD-10-CM | POA: Diagnosis not present

## 2022-11-15 DIAGNOSIS — I5032 Chronic diastolic (congestive) heart failure: Secondary | ICD-10-CM | POA: Diagnosis not present

## 2022-11-15 DIAGNOSIS — K2100 Gastro-esophageal reflux disease with esophagitis, without bleeding: Secondary | ICD-10-CM | POA: Diagnosis not present

## 2022-11-16 DIAGNOSIS — S32592D Other specified fracture of left pubis, subsequent encounter for fracture with routine healing: Secondary | ICD-10-CM | POA: Diagnosis not present

## 2022-11-16 DIAGNOSIS — M1990 Unspecified osteoarthritis, unspecified site: Secondary | ICD-10-CM | POA: Diagnosis not present

## 2022-11-16 DIAGNOSIS — Z7951 Long term (current) use of inhaled steroids: Secondary | ICD-10-CM | POA: Diagnosis not present

## 2022-11-16 DIAGNOSIS — J45909 Unspecified asthma, uncomplicated: Secondary | ICD-10-CM | POA: Diagnosis not present

## 2022-11-16 DIAGNOSIS — E890 Postprocedural hypothyroidism: Secondary | ICD-10-CM | POA: Diagnosis not present

## 2022-11-16 DIAGNOSIS — I700 Atherosclerosis of aorta: Secondary | ICD-10-CM | POA: Diagnosis not present

## 2022-11-16 DIAGNOSIS — Z96653 Presence of artificial knee joint, bilateral: Secondary | ICD-10-CM | POA: Diagnosis not present

## 2022-11-16 DIAGNOSIS — Z9181 History of falling: Secondary | ICD-10-CM | POA: Diagnosis not present

## 2022-11-16 DIAGNOSIS — I5032 Chronic diastolic (congestive) heart failure: Secondary | ICD-10-CM | POA: Diagnosis not present

## 2022-11-16 DIAGNOSIS — I110 Hypertensive heart disease with heart failure: Secondary | ICD-10-CM | POA: Diagnosis not present

## 2022-11-16 DIAGNOSIS — K219 Gastro-esophageal reflux disease without esophagitis: Secondary | ICD-10-CM | POA: Diagnosis not present

## 2022-11-16 DIAGNOSIS — H3530 Unspecified macular degeneration: Secondary | ICD-10-CM | POA: Diagnosis not present

## 2022-11-16 DIAGNOSIS — E785 Hyperlipidemia, unspecified: Secondary | ICD-10-CM | POA: Diagnosis not present

## 2022-11-16 DIAGNOSIS — Z791 Long term (current) use of non-steroidal anti-inflammatories (NSAID): Secondary | ICD-10-CM | POA: Diagnosis not present

## 2022-11-16 DIAGNOSIS — Z96642 Presence of left artificial hip joint: Secondary | ICD-10-CM | POA: Diagnosis not present

## 2022-11-21 DIAGNOSIS — I110 Hypertensive heart disease with heart failure: Secondary | ICD-10-CM | POA: Diagnosis not present

## 2022-11-21 DIAGNOSIS — I5032 Chronic diastolic (congestive) heart failure: Secondary | ICD-10-CM | POA: Diagnosis not present

## 2022-11-21 DIAGNOSIS — S32592D Other specified fracture of left pubis, subsequent encounter for fracture with routine healing: Secondary | ICD-10-CM | POA: Diagnosis not present

## 2022-11-22 DIAGNOSIS — E785 Hyperlipidemia, unspecified: Secondary | ICD-10-CM | POA: Diagnosis not present

## 2022-11-22 DIAGNOSIS — Z791 Long term (current) use of non-steroidal anti-inflammatories (NSAID): Secondary | ICD-10-CM | POA: Diagnosis not present

## 2022-11-22 DIAGNOSIS — I110 Hypertensive heart disease with heart failure: Secondary | ICD-10-CM | POA: Diagnosis not present

## 2022-11-22 DIAGNOSIS — M1990 Unspecified osteoarthritis, unspecified site: Secondary | ICD-10-CM | POA: Diagnosis not present

## 2022-11-22 DIAGNOSIS — H3530 Unspecified macular degeneration: Secondary | ICD-10-CM | POA: Diagnosis not present

## 2022-11-22 DIAGNOSIS — J45909 Unspecified asthma, uncomplicated: Secondary | ICD-10-CM | POA: Diagnosis not present

## 2022-11-22 DIAGNOSIS — I5032 Chronic diastolic (congestive) heart failure: Secondary | ICD-10-CM | POA: Diagnosis not present

## 2022-11-22 DIAGNOSIS — S32592D Other specified fracture of left pubis, subsequent encounter for fracture with routine healing: Secondary | ICD-10-CM | POA: Diagnosis not present

## 2022-11-22 DIAGNOSIS — K219 Gastro-esophageal reflux disease without esophagitis: Secondary | ICD-10-CM | POA: Diagnosis not present

## 2022-11-22 DIAGNOSIS — E890 Postprocedural hypothyroidism: Secondary | ICD-10-CM | POA: Diagnosis not present

## 2022-11-22 DIAGNOSIS — Z7951 Long term (current) use of inhaled steroids: Secondary | ICD-10-CM | POA: Diagnosis not present

## 2022-11-22 DIAGNOSIS — I700 Atherosclerosis of aorta: Secondary | ICD-10-CM | POA: Diagnosis not present

## 2022-11-22 DIAGNOSIS — Z9181 History of falling: Secondary | ICD-10-CM | POA: Diagnosis not present

## 2022-11-22 DIAGNOSIS — Z96642 Presence of left artificial hip joint: Secondary | ICD-10-CM | POA: Diagnosis not present

## 2022-11-22 DIAGNOSIS — Z96653 Presence of artificial knee joint, bilateral: Secondary | ICD-10-CM | POA: Diagnosis not present

## 2022-11-26 ENCOUNTER — Inpatient Hospital Stay (HOSPITAL_COMMUNITY)
Admission: EM | Admit: 2022-11-26 | Discharge: 2022-11-29 | DRG: 194 | Disposition: A | Discharge status: Nursing Home (Includes ICF) | Payer: Medicare Other | Source: Skilled Nursing Facility | Attending: Internal Medicine | Admitting: Internal Medicine

## 2022-11-26 ENCOUNTER — Other Ambulatory Visit: Payer: Self-pay

## 2022-11-26 ENCOUNTER — Emergency Department (HOSPITAL_COMMUNITY): Payer: Medicare Other

## 2022-11-26 ENCOUNTER — Encounter (HOSPITAL_COMMUNITY): Payer: Self-pay | Admitting: Emergency Medicine

## 2022-11-26 DIAGNOSIS — Z1152 Encounter for screening for COVID-19: Secondary | ICD-10-CM

## 2022-11-26 DIAGNOSIS — Z79899 Other long term (current) drug therapy: Secondary | ICD-10-CM

## 2022-11-26 DIAGNOSIS — B9620 Unspecified Escherichia coli [E. coli] as the cause of diseases classified elsewhere: Secondary | ICD-10-CM | POA: Diagnosis present

## 2022-11-26 DIAGNOSIS — R531 Weakness: Secondary | ICD-10-CM | POA: Diagnosis not present

## 2022-11-26 DIAGNOSIS — E890 Postprocedural hypothyroidism: Secondary | ICD-10-CM | POA: Diagnosis present

## 2022-11-26 DIAGNOSIS — J181 Lobar pneumonia, unspecified organism: Principal | ICD-10-CM | POA: Diagnosis present

## 2022-11-26 DIAGNOSIS — R0602 Shortness of breath: Secondary | ICD-10-CM | POA: Diagnosis not present

## 2022-11-26 DIAGNOSIS — R059 Cough, unspecified: Secondary | ICD-10-CM | POA: Diagnosis not present

## 2022-11-26 DIAGNOSIS — Z96642 Presence of left artificial hip joint: Secondary | ICD-10-CM | POA: Diagnosis not present

## 2022-11-26 DIAGNOSIS — Z7951 Long term (current) use of inhaled steroids: Secondary | ICD-10-CM

## 2022-11-26 DIAGNOSIS — J440 Chronic obstructive pulmonary disease with acute lower respiratory infection: Secondary | ICD-10-CM | POA: Diagnosis present

## 2022-11-26 DIAGNOSIS — R519 Headache, unspecified: Secondary | ICD-10-CM | POA: Diagnosis present

## 2022-11-26 DIAGNOSIS — R062 Wheezing: Secondary | ICD-10-CM | POA: Diagnosis not present

## 2022-11-26 DIAGNOSIS — I499 Cardiac arrhythmia, unspecified: Secondary | ICD-10-CM | POA: Diagnosis not present

## 2022-11-26 DIAGNOSIS — F32A Depression, unspecified: Secondary | ICD-10-CM | POA: Diagnosis present

## 2022-11-26 DIAGNOSIS — I110 Hypertensive heart disease with heart failure: Secondary | ICD-10-CM | POA: Diagnosis not present

## 2022-11-26 DIAGNOSIS — Z96653 Presence of artificial knee joint, bilateral: Secondary | ICD-10-CM | POA: Diagnosis not present

## 2022-11-26 DIAGNOSIS — J90 Pleural effusion, not elsewhere classified: Secondary | ICD-10-CM | POA: Diagnosis not present

## 2022-11-26 DIAGNOSIS — R7881 Bacteremia: Secondary | ICD-10-CM | POA: Diagnosis not present

## 2022-11-26 DIAGNOSIS — Z79890 Hormone replacement therapy (postmenopausal): Secondary | ICD-10-CM | POA: Diagnosis not present

## 2022-11-26 DIAGNOSIS — K219 Gastro-esophageal reflux disease without esophagitis: Secondary | ICD-10-CM | POA: Diagnosis not present

## 2022-11-26 DIAGNOSIS — K449 Diaphragmatic hernia without obstruction or gangrene: Secondary | ICD-10-CM | POA: Diagnosis not present

## 2022-11-26 DIAGNOSIS — F419 Anxiety disorder, unspecified: Secondary | ICD-10-CM | POA: Diagnosis present

## 2022-11-26 DIAGNOSIS — I10 Essential (primary) hypertension: Secondary | ICD-10-CM | POA: Diagnosis present

## 2022-11-26 DIAGNOSIS — E782 Mixed hyperlipidemia: Secondary | ICD-10-CM | POA: Diagnosis not present

## 2022-11-26 DIAGNOSIS — Z800 Family history of malignant neoplasm of digestive organs: Secondary | ICD-10-CM | POA: Diagnosis not present

## 2022-11-26 DIAGNOSIS — N390 Urinary tract infection, site not specified: Secondary | ICD-10-CM | POA: Diagnosis present

## 2022-11-26 DIAGNOSIS — H547 Unspecified visual loss: Secondary | ICD-10-CM | POA: Diagnosis present

## 2022-11-26 DIAGNOSIS — Z66 Do not resuscitate: Secondary | ICD-10-CM | POA: Diagnosis not present

## 2022-11-26 DIAGNOSIS — R110 Nausea: Secondary | ICD-10-CM | POA: Diagnosis not present

## 2022-11-26 DIAGNOSIS — Z743 Need for continuous supervision: Secondary | ICD-10-CM | POA: Diagnosis not present

## 2022-11-26 DIAGNOSIS — I5032 Chronic diastolic (congestive) heart failure: Secondary | ICD-10-CM | POA: Diagnosis present

## 2022-11-26 DIAGNOSIS — J189 Pneumonia, unspecified organism: Principal | ICD-10-CM

## 2022-11-26 DIAGNOSIS — J9811 Atelectasis: Secondary | ICD-10-CM | POA: Diagnosis not present

## 2022-11-26 LAB — MRSA NEXT GEN BY PCR, NASAL: MRSA by PCR Next Gen: NOT DETECTED

## 2022-11-26 LAB — URINALYSIS, ROUTINE W REFLEX MICROSCOPIC
Bilirubin Urine: NEGATIVE
Glucose, UA: NEGATIVE mg/dL
Hgb urine dipstick: NEGATIVE
Ketones, ur: NEGATIVE mg/dL
Nitrite: NEGATIVE
Protein, ur: NEGATIVE mg/dL
Specific Gravity, Urine: 1.008 (ref 1.005–1.030)
pH: 7 (ref 5.0–8.0)

## 2022-11-26 LAB — RESPIRATORY PANEL BY PCR

## 2022-11-26 LAB — BASIC METABOLIC PANEL
Anion gap: 9 (ref 5–15)
BUN: 16 mg/dL (ref 8–23)
CO2: 22 mmol/L (ref 22–32)
Calcium: 9.1 mg/dL (ref 8.9–10.3)
Chloride: 100 mmol/L (ref 98–111)
Creatinine, Ser: 0.9 mg/dL (ref 0.44–1.00)
GFR, Estimated: >60 mL/min (ref >60)
Glucose, Bld: 106 mg/dL — ABNORMAL HIGH (ref 70–99)
Potassium: 4 mmol/L (ref 3.5–5.1)
Sodium: 131 mmol/L — ABNORMAL LOW (ref 135–145)

## 2022-11-26 LAB — CBC WITH DIFFERENTIAL/PLATELET
Abs Immature Granulocytes: 0.06 10*3/uL (ref 0.00–0.07)
Basophils Absolute: 0 10*3/uL (ref 0.0–0.1)
Basophils Relative: 0 %
Eosinophils Absolute: 0 10*3/uL (ref 0.0–0.5)
Eosinophils Relative: 0 %
HCT: 33.6 % — ABNORMAL LOW (ref 36.0–46.0)
Hemoglobin: 10.4 g/dL — ABNORMAL LOW (ref 12.0–15.0)
Immature Granulocytes: 1 %
Lymphocytes Relative: 6 %
Lymphs Abs: 0.7 10*3/uL (ref 0.7–4.0)
MCH: 30.4 pg (ref 26.0–34.0)
MCHC: 31 g/dL (ref 30.0–36.0)
MCV: 98.2 fL (ref 80.0–100.0)
Monocytes Absolute: 1.2 10*3/uL — ABNORMAL HIGH (ref 0.1–1.0)
Monocytes Relative: 10 %
Neutro Abs: 9.4 10*3/uL — ABNORMAL HIGH (ref 1.7–7.7)
Neutrophils Relative %: 83 %
Platelets: 252 10*3/uL (ref 150–400)
RBC: 3.42 MIL/uL — ABNORMAL LOW (ref 3.87–5.11)
RDW: 15.9 % — ABNORMAL HIGH (ref 11.5–15.5)
WBC: 11.4 10*3/uL — ABNORMAL HIGH (ref 4.0–10.5)
nRBC: 0 % (ref 0.0–0.2)

## 2022-11-26 LAB — SARS CORONAVIRUS 2 BY RT PCR: SARS Coronavirus 2 by RT PCR: NEGATIVE

## 2022-11-26 LAB — STREP PNEUMONIAE URINARY ANTIGEN: Strep Pneumo Urinary Antigen: NEGATIVE

## 2022-11-26 LAB — BRAIN NATRIURETIC PEPTIDE: B Natriuretic Peptide: 244 pg/mL — ABNORMAL HIGH (ref 0.0–100.0)

## 2022-11-26 LAB — LACTIC ACID, PLASMA: Lactic Acid, Venous: 0.8 mmol/L (ref 0.5–1.9)

## 2022-11-26 LAB — TROPONIN I (HIGH SENSITIVITY)
Troponin I (High Sensitivity): 15 ng/L (ref <18)
Troponin I (High Sensitivity): 14 ng/L (ref <18)

## 2022-11-26 MED ORDER — POLYETHYLENE GLYCOL 3350 17 G PO PACK: 17.0000 g | PACK | Freq: Every day | ORAL | Status: DC | PRN

## 2022-11-26 MED ORDER — ONDANSETRON HCL 4 MG/2ML IJ SOLN: 4.0000 mg | Freq: Four times a day (QID) | INTRAMUSCULAR | Status: DC | PRN

## 2022-11-26 MED ORDER — SODIUM CHLORIDE 0.9 % IV SOLN
500.0000 mg | Freq: Once | INTRAVENOUS | Status: AC
  Administered 2022-11-26: 500 mg via INTRAVENOUS
  Filled 2022-11-26: qty 5

## 2022-11-26 MED ORDER — SODIUM CHLORIDE 0.9 % IV SOLN: 1.0000 g | INTRAVENOUS | Status: DC

## 2022-11-26 MED ORDER — SENNA 8.6 MG PO TABS
1.0000 | ORAL_TABLET | Freq: Two times a day (BID) | ORAL | Status: DC
  Administered 2022-11-26 – 2022-11-29 (×7): 8.6 mg via ORAL
  Filled 2022-11-26 (×7): qty 1

## 2022-11-26 MED ORDER — LEVOTHYROXINE SODIUM 100 MCG PO TABS
100.0000 ug | ORAL_TABLET | Freq: Every day | ORAL | Status: DC
  Administered 2022-11-26 – 2022-11-29 (×4): 100 ug via ORAL
  Filled 2022-11-26 (×3): qty 1
  Filled 2022-11-26: qty 2

## 2022-11-26 MED ORDER — BUDESONIDE 0.5 MG/2ML IN SUSP
0.5000 mg | Freq: Two times a day (BID) | RESPIRATORY_TRACT | Status: DC
  Administered 2022-11-26 (×2): 0.5 mg via RESPIRATORY_TRACT
  Filled 2022-11-26 (×2): qty 2

## 2022-11-26 MED ORDER — METHOCARBAMOL 500 MG PO TABS: 500.0000 mg | ORAL_TABLET | Freq: Three times a day (TID) | ORAL | Status: DC | PRN

## 2022-11-26 MED ORDER — VITAMIN B-12 1000 MCG PO TABS
1000.0000 ug | ORAL_TABLET | Freq: Every day | ORAL | Status: DC
  Administered 2022-11-26 – 2022-11-29 (×4): 1000 ug via ORAL
  Filled 2022-11-26 (×4): qty 1

## 2022-11-26 MED ORDER — PREDNISONE 10 MG PO TABS
5.0000 mg | ORAL_TABLET | Freq: Every day | ORAL | Status: DC
  Administered 2022-11-26 – 2022-11-29 (×4): 5 mg via ORAL
  Filled 2022-11-26 (×4): qty 1

## 2022-11-26 MED ORDER — IPRATROPIUM-ALBUTEROL 0.5-2.5 (3) MG/3ML IN SOLN
3.0000 mL | Freq: Four times a day (QID) | RESPIRATORY_TRACT | Status: DC
  Administered 2022-11-26 – 2022-11-27 (×5): 3 mL via RESPIRATORY_TRACT
  Filled 2022-11-26 (×5): qty 3

## 2022-11-26 MED ORDER — LOSARTAN POTASSIUM 50 MG PO TABS
50.0000 mg | ORAL_TABLET | Freq: Every day | ORAL | Status: DC
  Administered 2022-11-27 – 2022-11-29 (×3): 50 mg via ORAL
  Filled 2022-11-26 (×3): qty 1

## 2022-11-26 MED ORDER — BUDESONIDE 0.5 MG/2ML IN SUSP: 0.5000 mg | Freq: Two times a day (BID) | RESPIRATORY_TRACT | Status: DC

## 2022-11-26 MED ORDER — SODIUM CHLORIDE 0.9 % IV SOLN
1.0000 g | Freq: Once | INTRAVENOUS | Status: AC
  Administered 2022-11-26: 1 g via INTRAVENOUS
  Filled 2022-11-26: qty 10

## 2022-11-26 MED ORDER — AZITHROMYCIN 250 MG PO TABS
500.0000 mg | ORAL_TABLET | Freq: Every day | ORAL | Status: DC
  Administered 2022-11-27 – 2022-11-29 (×3): 500 mg via ORAL
  Filled 2022-11-26 (×3): qty 2

## 2022-11-26 MED ORDER — PANTOPRAZOLE SODIUM 40 MG PO TBEC
40.0000 mg | DELAYED_RELEASE_TABLET | Freq: Every day | ORAL | Status: DC
  Administered 2022-11-26 – 2022-11-29 (×4): 40 mg via ORAL
  Filled 2022-11-26 (×4): qty 1

## 2022-11-26 MED ORDER — ACETAMINOPHEN 650 MG RE SUPP: 650.0000 mg | Freq: Four times a day (QID) | RECTAL | Status: DC | PRN

## 2022-11-26 MED ORDER — ENOXAPARIN SODIUM 40 MG/0.4ML IJ SOSY
40.0000 mg | PREFILLED_SYRINGE | INTRAMUSCULAR | Status: DC
  Administered 2022-11-26 – 2022-11-28 (×3): 40 mg via SUBCUTANEOUS
  Filled 2022-11-26 (×3): qty 0.4

## 2022-11-26 MED ORDER — IOHEXOL 350 MG/ML SOLN
75.0000 mL | Freq: Once | INTRAVENOUS | Status: AC | PRN
  Administered 2022-11-26: 75 mL via INTRAVENOUS

## 2022-11-26 MED ORDER — ROSUVASTATIN CALCIUM 10 MG PO TABS
10.0000 mg | ORAL_TABLET | Freq: Every day | ORAL | Status: DC
  Administered 2022-11-26 – 2022-11-28 (×3): 10 mg via ORAL
  Filled 2022-11-26 (×4): qty 1

## 2022-11-26 MED ORDER — VITAMIN D 25 MCG (1000 UNIT) PO TABS
1000.0000 [IU] | ORAL_TABLET | Freq: Every day | ORAL | Status: DC
  Administered 2022-11-26 – 2022-11-29 (×4): 1000 [IU] via ORAL
  Filled 2022-11-26 (×4): qty 1

## 2022-11-26 MED ORDER — ARFORMOTEROL TARTRATE 15 MCG/2ML IN NEBU
15.0000 ug | INHALATION_SOLUTION | Freq: Two times a day (BID) | RESPIRATORY_TRACT | Status: DC
  Administered 2022-11-26 – 2022-11-29 (×7): 15 ug via RESPIRATORY_TRACT
  Filled 2022-11-26 (×7): qty 2

## 2022-11-26 MED ORDER — SODIUM CHLORIDE 0.9 % IV SOLN
2.0000 g | INTRAVENOUS | Status: DC
  Administered 2022-11-27 – 2022-11-29 (×3): 2 g via INTRAVENOUS
  Filled 2022-11-26 (×3): qty 20

## 2022-11-26 MED ORDER — ONDANSETRON HCL 4 MG PO TABS: 4.0000 mg | ORAL_TABLET | Freq: Four times a day (QID) | ORAL | Status: DC | PRN

## 2022-11-26 MED ORDER — MONTELUKAST SODIUM 10 MG PO TABS
10.0000 mg | ORAL_TABLET | Freq: Every day | ORAL | Status: DC
  Administered 2022-11-26 – 2022-11-28 (×3): 10 mg via ORAL
  Filled 2022-11-26 (×3): qty 1

## 2022-11-26 MED ORDER — SERTRALINE HCL 50 MG PO TABS
50.0000 mg | ORAL_TABLET | Freq: Every day | ORAL | Status: DC
  Administered 2022-11-26 – 2022-11-29 (×4): 50 mg via ORAL
  Filled 2022-11-26 (×4): qty 1

## 2022-11-26 MED ORDER — PHENOL 1.4 % MT LIQD
1.0000 | OROMUCOSAL | Status: DC | PRN
  Administered 2022-11-26: 1 via OROMUCOSAL
  Filled 2022-11-26: qty 177

## 2022-11-26 MED ORDER — ACETAMINOPHEN 325 MG PO TABS
650.0000 mg | ORAL_TABLET | Freq: Four times a day (QID) | ORAL | Status: DC | PRN
  Administered 2022-11-26 – 2022-11-29 (×6): 650 mg via ORAL
  Filled 2022-11-26 (×6): qty 2

## 2022-11-26 NOTE — ED Triage Notes (Addendum)
Pt from [facility] via [facility] c/o shortness of breath since about 0430. Neb was giving by facility. Congested cough present.  93% on RA with EMS however, 3 l  was placed for comfort.  C/o nausea since yesterday.FYI patient is visually impaired. [place] RN

## 2022-11-26 NOTE — ED Notes (Signed)
Patient has on Purewick. Waiting for Urination.

## 2022-11-26 NOTE — ED Provider Notes (Signed)
[place] [facility]ERGENCY DEPARTMENT AT [facility] Provider Note   CSN: [id number] Arrival date & time: 11/26/22  4098     History  Chief Complaint  Patient presents with   Shortness of Breath    Lindsey Butler is a 89 y.o. female.  She is brought in by ambulance from her facility for shortness of breath.  She said she has had a cough and shortness of breath since yesterday.  She has visual impairment.  Lives at a facility.  She tells me she has a history of asthma and has tried her inhaler without improvement.  She has had some nausea and dry heaves but no vomiting no abdominal pain no chest pain.  She does endorse a little bit of a headache.  The history is provided by the patient.  Shortness of Breath Severity:  Moderate Onset quality:  Gradual Duration:  2 days Timing:  Constant Progression:  Unchanged Chronicity:  New Relieved by:  Nothing Worsened by:  Activity and coughing Ineffective treatments:  Inhaler Associated symptoms: cough and headaches   Associated symptoms: no abdominal pain, no chest pain and no fever   Risk factors: no tobacco use        Home Medications Prior to Admission medications   Medication Sig Start Date End Date Taking? Authorizing Provider  acetaminophen (TYLENOL) 500 MG tablet Take 2 tablets (1,000 mg total) by mouth every 8 (eight) hours. 09/09/22   Vassie Loll, MD  albuterol (VENTOLIN HFA) 108 (90 Base) MCG/ACT inhaler Inhale 2 puffs into the lungs every 4 (four) hours as needed for wheezing or shortness of breath. 03/05/21   Emokpae, Courage, MD  budesonide (PULMICORT) 0.5 MG/2ML nebulizer solution Take 2 mLs (0.5 mg total) by nebulization 2 (two) times daily. 03/05/21   Shon Hale, MD  cholecalciferol (VITAMIN D) 25 MCG (1000 UNIT) tablet Take 1,000 Units by mouth daily. 11/07/20   [provider]  diclofenac Sodium (VOLTAREN) 1 % GEL Apply 2 g topically 4 (four) times daily. 03/17/22   [provider]   furosemide (LASIX) 20 MG tablet Take 1 tablet (20 mg total) by mouth daily as needed for fluid or edema. 09/09/22   Vassie Loll, MD  ipratropium-albuterol (DUONEB) 0.5-2.5 (3) MG/3ML SOLN Take 3 mLs by nebulization every 6 (six) hours as needed (SOB). 03/05/21   Shon Hale, MD  levothyroxine (SYNTHROID) 100 MCG tablet Take 100 mcg by mouth daily. 08/19/22   [provider]  losartan (COZAAR) 50 MG tablet Take 50 mg by mouth daily. 08/19/22   [provider]  methocarbamol (ROBAXIN) 500 MG tablet Take 1 tablet (500 mg total) by mouth every 8 (eight) hours as needed for muscle spasms. 09/09/22   Vassie Loll, MD  montelukast (SINGULAIR) 10 MG tablet Take 1 tablet (10 mg total) by mouth at bedtime. 11/01/19   Sharee Holster, NP  oxyCODONE (OXY IR/ROXICODONE) 5 MG immediate release tablet Take 1-2 tablets (5-10 mg total) by mouth every 6 (six) hours as needed for severe pain. 09/09/22   Vassie Loll, MD  pantoprazole (PROTONIX) 40 MG tablet Take 40 mg by mouth daily. 02/24/21   [provider]  polyethylene glycol (MIRALAX / GLYCOLAX) 17 g packet Take 17 g by mouth daily as needed for mild constipation. 09/09/22   Vassie Loll, MD  predniSONE (DELTASONE) 5 MG tablet Take 5 mg by mouth daily. 08/19/22   [provider]  rosuvastatin (CRESTOR) 10 MG tablet Take 10 mg by mouth at bedtime. 08/19/22  [provider]  senna (SENOKOT) 8.6 MG TABS tablet Take 1 tablet (8.6 mg total) by mouth 2 (two) times daily. Hold for diarrhea. 09/09/22   Vassie Loll, MD  sertraline (ZOLOFT) 50 MG tablet Take 50 mg by mouth daily. 08/19/22   [provider]  SPIRIVA HANDIHALER 18 MCG inhalation capsule 1 capsule daily. 08/19/22   [provider]  TRELEGY ELLIPTA 100-62.5-25 MCG/ACT AEPB Inhale 1 puff into the lungs daily. 08/19/22   [provider]  vitamin B-12 (CYANOCOBALAMIN) 1000 MCG tablet Take 1,000 mcg by mouth daily. 02/24/21   [provider]      Allergies    Patient has no known allergies.    Review of Systems   Review of Systems  Constitutional:  Negative for fever.  Eyes:  Positive for visual disturbance.  Respiratory:  Positive for cough and shortness of breath.   Cardiovascular:  Negative for chest pain.  Gastrointestinal:  Positive for nausea. Negative for abdominal pain.  Neurological:  Positive for headaches.    Physical Exam Updated Vital Signs BP (!) 140/65   Pulse 85   Temp 98.5 F (36.9 C)   Resp 19   Ht  (1.702 m)   SpO2 95%   BMI 20.49 kg/m  Physical Exam Vitals and nursing note reviewed.  Constitutional:      General: She is not in acute distress.    Appearance: She is well-developed.  HENT:     Head: Normocephalic and atraumatic.  Eyes:     Conjunctiva/sclera: Conjunctivae normal.  Cardiovascular:     Rate and Rhythm: Normal rate and regular rhythm.     Heart sounds: Murmur heard.  Pulmonary:     Effort: Accessory muscle usage present. No respiratory distress.     Breath sounds: Rhonchi present.  Abdominal:     Palpations: Abdomen is soft.     Tenderness: There is no abdominal tenderness.  Musculoskeletal:        General: No swelling.     Cervical back: Neck supple.     Right lower leg: No tenderness. No edema.     Left lower leg: No tenderness. No edema.  Skin:    General: Skin is warm and dry.     Capillary Refill: Capillary refill takes less than 2 seconds.  Neurological:     General: No focal deficit present.     Mental Status: She is alert.     ED Results / Procedures / Treatments   Labs (all labs ordered are listed, but only abnormal results are displayed) Labs Reviewed  BRAIN NATRIURETIC PEPTIDE - Abnormal; Notable for the following components:      Result Value   B Natriuretic Peptide 244.0 (*)    All other components within normal limits  BASIC METABOLIC PANEL - Abnormal; Notable for the following components:   Sodium 131 (*)    Glucose, Bld  106 (*)    All other components within normal limits  CBC WITH DIFFERENTIAL/PLATELET - Abnormal; Notable for the following components:   WBC 11.4 (*)    RBC 3.42 (*)    Hemoglobin 10.4 (*)    HCT 33.6 (*)    RDW 15.9 (*)    Neutro Abs 9.4 (*)    Monocytes Absolute 1.2 (*)    All other components within normal limits  URINALYSIS, ROUTINE W REFLEX MICROSCOPIC - Abnormal; Notable for the following components:   Leukocytes,Ua MODERATE (*)    Bacteria, UA RARE (*)  All other components within normal limits  CULTURE, BLOOD (ROUTINE X 2)  CULTURE, BLOOD (ROUTINE X 2)  SARS CORONAVIRUS 2 BY RT PCR  MRSA NEXT GEN BY PCR, NASAL  URINE CULTURE  RESPIRATORY PANEL BY PCR  LACTIC ACID, PLASMA  LEGIONELLA PNEUMOPHILA SEROGP 1 UR AG  STREP PNEUMONIAE URINARY ANTIGEN  BASIC METABOLIC PANEL  CBC  TROPONIN I (HIGH SENSITIVITY)  TROPONIN I (HIGH SENSITIVITY)    EKG EKG Interpretation  Date/Time:  Saturday November 26 2022 06:38:06 EDT Ventricular Rate:  91 PR Interval:    QRS Duration: 154 QT Interval:  408 QTC Calculation: 502 R Axis:   59 Text Interpretation: probable sinus Nonspecific intraventricular conduction delay Partial missing lead(s): V6 No significant change since prior 7/22 Confirmed by Meridee Score [phone]) on 11/26/2022 6:56:37 AM  Radiology CT Angio Chest PE W/Cm &/Or Wo Cm  Result Date: 11/26/2022 CLINICAL DATA:  89 year old female with shortness of breath since 0430 hours. Congestion. EXAM: CT ANGIOGRAPHY CHEST WITH CONTRAST TECHNIQUE: Multidetector CT imaging of the chest was performed using the standard protocol during bolus administration of intravenous contrast. Multiplanar CT image reconstructions and MIPs were obtained to evaluate the vascular anatomy. RADIATION DOSE REDUCTION: This exam was performed according to the departmental dose-optimization program which includes automated exposure control, adjustment of the mA and/or kV according to patient size and/or use of  iterative reconstruction technique. CONTRAST:  75mL OMNIPAQUE IOHEXOL 350 MG/ML SOLN COMPARISON:  Chest CTA 03/02/2021.  Portable chest 0706 hours today. FINDINGS: Cardiovascular: Good contrast bolus timing in the pulmonary arterial tree. No pulmonary artery filling defect. Chronic Calcified aortic atherosclerosis, calcified coronary artery atherosclerosis. Stable heart size since 2022, borderline to mild enlarged. No pericardial effusion. Mediastinum/Nodes: Large chronic stomach, mesentery, and proximal duodenum containing hiatal hernia. This is larger since 2022 and now also contains a segment of the transverse colon and transverse mesocolon. No superimposed mediastinal mass or lymphadenopathy. Lungs/Pleura: Lower lung volumes compared to 2022. New small layering right pleural effusion. Partially enhancing confluent medial basal segment, posterior basal segment right lower lobe opacity. In part this resembles compressive atelectasis. Major airways remain patent. More typical appearing middle lobe and left lower lobe atelectasis. Upper Abdomen: Negative visible liver, spleen, pancreas, adrenal glands and kidneys. Aside from large hiatal hernia and diverticulosis the visible upper bowel is negative. No free air or free fluid in the visible upper abdomen. Musculoskeletal: Chronic T8 and L1 compression fractures do not appear significantly changed from 2022. Chronic manubrium fracture also not significantly changed. Underlying osteopenia. Lateral right 5th rib fracture is mildly displaced and new since 2022 although appears chronic and partially healed. No acute osseous abnormality identified. Review of the MIP images confirms the above findings. IMPRESSION: 1. Negative for acute pulmonary embolus. 2. Small new layering right pleural effusion and right lower lobe opacity indeterminate for atelectasis (see #3) versus mild Pneumonia. 3. Superimposed very large hiatal hernia has increased since 2022, and now contains a  segment of the transverse colon and related mesentery, in addition to the stomach and proximal duodenum. 4. Chronic T8 and L1 compression fractures. Chronic manubrium and right 5th rib fractures. 5.  Aortic Atherosclerosis (ICD10-I70.0). Electronically Signed   By: Odessa Fleming M.D.   On: 11/26/2022 09:52   DG Chest Port 1 View  Result Date: 11/26/2022 CLINICAL DATA:  88 year old female with shortness of breath. Cough and congestion. EXAM: PORTABLE CHEST 1 VIEW COMPARISON:  Chest radiographs 08/26/2021 and earlier. FINDINGS: Portable AP upright view at 0706 hours. Large chronic  gastric hiatal hernia. Stable cardiac size and mediastinal contours. Calcified aortic atherosclerosis. Lower lung volumes compared to last year. No pneumothorax or pulmonary edema. Hiatal hernia associated lung base atelectasis. No convincing pleural effusion or consolidation. Chronic appearing right lateral 4th rib fracture. No acute osseous abnormality identified. Negative visible bowel gas. IMPRESSION: Lower lung volumes. Large chronic gastric hiatal hernia, with associated atelectasis. No other acute cardiopulmonary abnormality. Electronically Signed   By: Odessa Fleming M.D.   On: 11/26/2022 07:23    Procedures Procedures    Medications Ordered in ED Medications  losartan (COZAAR) tablet 50 mg (has no administration in time range)  rosuvastatin (CRESTOR) tablet 10 mg (has no administration in time range)  sertraline (ZOLOFT) tablet 50 mg (50 mg Oral Given 11/26/22 1152)  levothyroxine (SYNTHROID) tablet 100 mcg (100 mcg Oral Given 11/26/22 1152)  predniSONE (DELTASONE) tablet 5 mg (5 mg Oral Given 11/26/22 1153)  pantoprazole (PROTONIX) EC tablet 40 mg (40 mg Oral Given 11/26/22 1152)  polyethylene glycol (MIRALAX / GLYCOLAX) packet 17 g (has no administration in time range)  senna (SENOKOT) tablet 8.6 mg (8.6 mg Oral Given 11/26/22 1153)  methocarbamol (ROBAXIN) tablet 500 mg (has no administration in time range)  cyanocobalamin  (VITAMIN B12) tablet 1,000 mcg (1,000 mcg Oral Given 11/26/22 1153)  budesonide (PULMICORT) nebulizer solution 0.5 mg (0.5 mg Nebulization Given 11/26/22 1227)  montelukast (SINGULAIR) tablet 10 mg (has no administration in time range)  cholecalciferol (VITAMIN D3) 25 MCG (1000 UNIT) tablet 1,000 Units (1,000 Units Oral Given 11/26/22 1152)  acetaminophen (TYLENOL) tablet 650 mg (has no administration in time range)    Or  acetaminophen (TYLENOL) suppository 650 mg (has no administration in time range)  ondansetron (ZOFRAN) tablet 4 mg (has no administration in time range)    Or  ondansetron (ZOFRAN) injection 4 mg (has no administration in time range)  enoxaparin (LOVENOX) injection 40 mg (40 mg Subcutaneous Given 11/26/22 1449)  ipratropium-albuterol (DUONEB) 0.5-2.5 (3) MG/3ML nebulizer solution 3 mL (3 mLs Nebulization Given 11/26/22 1448)  arformoterol (BROVANA) nebulizer solution 15 mcg (15 mcg Nebulization Given 11/26/22 1227)  azithromycin (ZITHROMAX) tablet 500 mg (has no administration in time range)  cefTRIAXone (ROCEPHIN) 2 g in sodium chloride 0.9 % 100 mL IVPB (has no administration in time range)  iohexol (OMNIPAQUE) 350 MG/ML injection 75 mL (75 mLs Intravenous Contrast Given 11/26/22 0923)  cefTRIAXone (ROCEPHIN) 1 g in sodium chloride 0.9 % 100 mL IVPB (0 g Intravenous Stopped 11/26/22 1053)  azithromycin (ZITHROMAX) 500 mg in sodium chloride 0.9 % 250 mL IVPB (0 mg Intravenous Stopped 11/26/22 1126)    ED Course/ Medical Decision Making/ A&P Clinical Course as of 11/26/22 1716  Sat Nov 26, 2022  0720 Chest x-ray interpreted by me as significant rotation possible right lower lobe.  Awaiting radiology reading. [MB]  1021 Patient's son is here.  He said she is at her baseline. [MB]  1050 Discussed with [facility] hospitalist Dr. Arbutus Leas. [MB]    Clinical Course User Index [MB] Terrilee Files, MD                             Medical Decision Making Amount and/or Complexity of Data  Reviewed Labs: ordered. Radiology: ordered.  Risk Prescription drug management. Decision regarding hospitalization.   This patient complains of cough shortness of breath; this involves an extensive number of treatment Options and is a complaint that carries with it a high risk of  complications and morbidity. The differential includes pneumonia, aspiration, COVID, anemia, bronchitis  I ordered, reviewed and interpreted labs, which included CBC with elevated white count stable low hemoglobin, chemistries unremarkable, lactate normal, BNP elevated, blood culture sent, COVID-negative I ordered medication IV antibiotics and reviewed PMP when indicated. I ordered imaging studies which included chest x-ray and CT angio chest and I independently    visualized and interpreted imaging which showed possible infiltrate, large hiatal hernia, old compression fractures Additional history obtained from EMS and patient's son Previous records obtained and reviewed in epic, including recent admission I consulted [facility] hospitalist Dr. Arbutus Leas and discussed lab and imaging findings and discussed disposition.  Cardiac monitoring reviewed, sinus rhythm Social determinants considered, no significant barriers Critical Interventions: None  After the interventions stated above, I reevaluated the patient and found patient to be reasonably comfortable resting in bed although still with intermittent hacking cough Admission and further testing considered, patient would benefit from mission to the hospital for IV antibiotics and continued management of possible early pneumonia.         Final Clinical Impression(s) / ED Diagnoses Final diagnoses:  Community acquired pneumonia, unspecified laterality    Rx / DC Orders ED Discharge Orders     None         Terrilee Files, MD 11/26/22 1719

## 2022-11-26 NOTE — ED Notes (Signed)
Patient transported to CT 

## 2022-11-26 NOTE — ED Notes (Signed)
Waiting on pt to urinate

## 2022-11-26 NOTE — Hospital Course (Addendum)
89 year old female with a history of hypertension, HFpEF, COPD/asthma, GERD, hyperlipidemia, hypothyroidism, large hiatus hernia presenting with coughing and shortness of breath.  The patient states that she has been short of breath for about a year, but it has worsened in the past day associated with increasing cough and chest congestion.  She has had some nausea without any emesis.  She denies any fevers, chills, headache, chest pain, hemoptysis, vomiting, diarrhea, abdominal pain.  She denies any dysuria or hematuria. Notably, the patient was recently admitted to the hospital from 09/08/2022 to 09/09/2022 after mechanical fall sustaining a pelvic fracture.  She was subsequently discharged to ALF where she has remained since then.  At baseline now, the patient is able to ambulate with a walker.  She is able to make transfers with minimal assistance.  She presently denies any hip pain or leg pain.  She denies any recent falls. In the ED, the patient was afebrile and hemodynamically stable with oxygen saturation 93% on room air.  The patient was placed on nasal cannula for discomfort. CTA chest was negative for pulmonary embolus.  This showed a small new right pleural effusion with right lower lobe opacity.  There was a large hiatus hernia.  There is chronic T8 and L1 fractures.  There is a chronic manubrium and right fifth rib fracture. WBC 11.4, hemoglobin 10.4, platelets 252,000.  Sodium 131, potassium 4.0, bicarbonate 22, serum creatinine 0.90.  The patient was started on ceftriaxone and azithromycin.  She improved clinically.  She remained afebrile and hemodynamically stable and saturation 97-99% on RA.  Blood cultures and urine cultures grew E coli.  Her ceftriaxone was increased to 2 grams daily.  Once final culture data returned she was transitioned to po cefdinir to finish 10 day course of abx

## 2022-11-26 NOTE — H&P (Signed)
History and Physical    Patient: Lindsey Butler:[id number] DOB: Jan 06, 1929 DOA: 11/26/2022 DOS: the patient was seen and examined on 11/26/2022 PCP: Toma Deiters, MD  Patient coming from: ALF/ILF  Chief Complaint:  Chief Complaint  Patient presents with   Shortness of Breath   HPI: Lindsey Butler is a 89 year old female with a history of hypertension, HFpEF, COPD/asthma, GERD, hyperlipidemia, hypothyroidism, large hiatus hernia presenting with coughing and shortness of breath.  The patient states that she has been short of breath for about a year, but it has worsened in the past day associated with increasing cough and chest congestion.  She has had some nausea without any emesis.  She denies any fevers, chills, headache, chest pain, hemoptysis, vomiting, diarrhea, abdominal pain.  She denies any dysuria or hematuria. Notably, the patient was recently admitted to the hospital from 09/08/2022 to 09/09/2022 after mechanical fall sustaining a pelvic fracture.  She was subsequently discharged to ALF where she has remained since then.  At baseline now, the patient is able to ambulate with a walker.  She is able to make transfers with minimal assistance.  She presently denies any hip pain or leg pain.  She denies any recent falls. In the ED, the patient was afebrile and hemodynamically stable with oxygen saturation 93% on room air.  The patient was placed on nasal cannula for discomfort. CTA chest was negative for pulmonary embolus.  This showed a small new right pleural effusion with right lower lobe opacity.  There was a large hiatus hernia.  There is chronic T8 and L1 fractures.  There is a chronic manubrium and right fifth rib fracture. WBC 11.4, hemoglobin 10.4, platelets 252,000.  Sodium 131, potassium 4.0, bicarbonate 22, serum creatinine 0.90.  The patient was started on ceftriaxone and azithromycin.  Review of Systems: As mentioned in the history of present illness. All other systems  reviewed and are negative. Past Medical History:  Diagnosis Date   Actinic keratosis    Asthma    GERD (gastroesophageal reflux disease)    Hyperlipidemia    Hypothyroidism    Osteoarthritis    Past Surgical History:  Procedure Laterality Date   CATARACT EXTRACTION     left and right   HIP ARTHROPLASTY Left 10/04/2019   Procedure: ARTHROPLASTY BIPOLAR HIP (HEMIARTHROPLASTY);  Surgeon: Vickki Hearing, MD;  Location: [facility];  Service: Orthopedics;  Laterality: Left;   THYROIDECTOMY     TONSILLECTOMY     TOTAL KNEE ARTHROPLASTY     right and left knee   VAGINAL HYSTERECTOMY     Social History:  reports that she has never smoked. She has never used smokeless tobacco. She reports that she does not drink alcohol and does not use drugs.  No Known Allergies  Family History  Problem Relation Age of Onset   Cancer Son    Liver cancer Brother     Prior to Admission medications   Medication Sig Start Date End Date Taking? Authorizing Provider  acetaminophen (TYLENOL) 500 MG tablet Take 2 tablets (1,000 mg total) by mouth every 8 (eight) hours. 09/09/22   Vassie Loll, MD  albuterol (VENTOLIN HFA) 108 (90 Base) MCG/ACT inhaler Inhale 2 puffs into the lungs every 4 (four) hours as needed for wheezing or shortness of breath. 03/05/21   Emokpae, Courage, MD  budesonide (PULMICORT) 0.5 MG/2ML nebulizer solution Take 2 mLs (0.5 mg total) by nebulization 2 (two) times daily. 03/05/21   Shon Hale, MD  cholecalciferol (VITAMIN D)  25 MCG (1000 UNIT) tablet Take 1,000 Units by mouth daily. 11/07/20   [provider]  diclofenac Sodium (VOLTAREN) 1 % GEL Apply 2 g topically 4 (four) times daily. 03/17/22   [provider]  furosemide (LASIX) 20 MG tablet Take 1 tablet (20 mg total) by mouth daily as needed for fluid or edema. 09/09/22   Vassie Loll, MD  ipratropium-albuterol (DUONEB) 0.5-2.5 (3) MG/3ML SOLN Take 3 mLs by nebulization every 6 (six) hours as needed (SOB).  03/05/21   Shon Hale, MD  levothyroxine (SYNTHROID) 100 MCG tablet Take 100 mcg by mouth daily. 08/19/22   [provider]  losartan (COZAAR) 50 MG tablet Take 50 mg by mouth daily. 08/19/22   [provider]  methocarbamol (ROBAXIN) 500 MG tablet Take 1 tablet (500 mg total) by mouth every 8 (eight) hours as needed for muscle spasms. 09/09/22   Vassie Loll, MD  montelukast (SINGULAIR) 10 MG tablet Take 1 tablet (10 mg total) by mouth at bedtime. 11/01/19   Sharee Holster, NP  oxyCODONE (OXY IR/ROXICODONE) 5 MG immediate release tablet Take 1-2 tablets (5-10 mg total) by mouth every 6 (six) hours as needed for severe pain. 09/09/22   Vassie Loll, MD  pantoprazole (PROTONIX) 40 MG tablet Take 40 mg by mouth daily. 02/24/21   [provider]  polyethylene glycol (MIRALAX / GLYCOLAX) 17 g packet Take 17 g by mouth daily as needed for mild constipation. 09/09/22   Vassie Loll, MD  predniSONE (DELTASONE) 5 MG tablet Take 5 mg by mouth daily. 08/19/22   [provider]  rosuvastatin (CRESTOR) 10 MG tablet Take 10 mg by mouth at bedtime. 08/19/22   [provider]  senna (SENOKOT) 8.6 MG TABS tablet Take 1 tablet (8.6 mg total) by mouth 2 (two) times daily. Hold for diarrhea. 09/09/22   Vassie Loll, MD  sertraline (ZOLOFT) 50 MG tablet Take 50 mg by mouth daily. 08/19/22   [provider]  SPIRIVA HANDIHALER 18 MCG inhalation capsule 1 capsule daily. 08/19/22   [provider]  TRELEGY ELLIPTA 100-62.5-25 MCG/ACT AEPB Inhale 1 puff into the lungs daily. 08/19/22   [provider]  vitamin B-12 (CYANOCOBALAMIN) 1000 MCG tablet Take 1,000 mcg by mouth daily. 02/24/21   [provider]    Physical Exam: Vitals:   11/26/22 0730 11/26/22 0830 11/26/22 0900 11/26/22 1025  BP: (!) 116/56 121/62 116/63 (!) 141/60  Pulse: 83 77 78 70  Resp: 19 (!) Temp:    97.6 F (36.4 C)  TempSrc:    Oral  SpO2: 95% 97% 97% 97%   Height:       GENERAL:  A&O x 3, NAD, well developed, cooperative, follows commands HEENT: [place]/AT, No thrush, No icterus, No oral ulcers Neck:  No neck mass, No meningismus, soft, supple CV: RRR, no S3, no S4, no rub, no JVD Lungs: Bibasilar rales, right greater than left.  Minich breath sounds on the right.  No wheezing. Abd: soft/NT +BS, nondistended Ext: No edema, no lymphangitis, no cyanosis, no rashes Neuro:  CN II-XII intact, strength 4/5 in RUE, RLE, strength 4/5 LUE, LLE; sensation intact bilateral; no dysmetria; babinski equivocal  Data Reviewed: Data reviewed above in the history  Assessment and Plan: Lobar pneumonia -Continue ceftriaxone and azithromycin -MRSA screen -Judicious IV fluids -Urine Legionella antigen -Urine Streptococcus pneumoniae antigen -Lactic acid 0.8  COPD/asthma -Start Pulmicort -Start DuoNebs -Start Brovana  Pyuria -Obtain urine culture -Patient already on ceftriaxone  for pneumonia  Chronic HFpEF -Appears clinically euvolemic -echo 03/09/2020, reporting ejection fraction 65-70%, global grade 1   Essential hypertension -Continue losartan  Mixed hyperlipidemia -Continue Crestor  Depression/anxiety -Continue Zoloft  Hypothyroidism -Continue Synthroid  Hiatal hernia with GERD -Continue pantoprazole    Advance Care Planning: DNR--pt arrived with [place] form  Consults: none  Family Communication: son updated 4/20  Severity of Illness: The appropriate patient status for this patient is INPATIENT. Inpatient status is judged to be reasonable and necessary in order to provide the required intensity of service to ensure the patient's safety. The patient's presenting symptoms, physical exam findings, and initial radiographic and laboratory data in the context of their chronic comorbidities is felt to place them at high risk for further clinical deterioration. Furthermore, it is not anticipated that the patient will be medically stable  for discharge from the hospital within 2 midnights of admission.   * I certify that at the point of admission it is my clinical judgment that the patient will require inpatient hospital care spanning beyond 2 midnights from the point of admission due to high intensity of service, high risk for further deterioration and high frequency of surveillance required.*  Author: Catarina Hartshorn, MD 11/26/2022 11:17 AM  For on call review [email or web address].

## 2022-11-27 DIAGNOSIS — R7881 Bacteremia: Secondary | ICD-10-CM

## 2022-11-27 DIAGNOSIS — J181 Lobar pneumonia, unspecified organism: Secondary | ICD-10-CM | POA: Diagnosis not present

## 2022-11-27 DIAGNOSIS — I5032 Chronic diastolic (congestive) heart failure: Secondary | ICD-10-CM | POA: Diagnosis not present

## 2022-11-27 DIAGNOSIS — I10 Essential (primary) hypertension: Secondary | ICD-10-CM | POA: Diagnosis not present

## 2022-11-27 LAB — BLOOD CULTURE ID PANEL (REFLEXED) - BCID2

## 2022-11-27 LAB — BASIC METABOLIC PANEL
Anion gap: 9 (ref 5–15)
BUN: 12 mg/dL (ref 8–23)
CO2: 25 mmol/L (ref 22–32)
Calcium: 9.6 mg/dL (ref 8.9–10.3)
Chloride: 102 mmol/L (ref 98–111)
Creatinine, Ser: 0.72 mg/dL (ref 0.44–1.00)
GFR, Estimated: >60 mL/min (ref >60)
Glucose, Bld: 89 mg/dL (ref 70–99)
Potassium: 4.3 mmol/L (ref 3.5–5.1)
Sodium: 136 mmol/L (ref 135–145)

## 2022-11-27 LAB — CBC
HCT: 34 % — ABNORMAL LOW (ref 36.0–46.0)
Hemoglobin: 10.6 g/dL — ABNORMAL LOW (ref 12.0–15.0)
MCH: 30.9 pg (ref 26.0–34.0)
MCHC: 31.2 g/dL (ref 30.0–36.0)
MCV: 99.1 fL (ref 80.0–100.0)
Platelets: 264 10*3/uL (ref 150–400)
RBC: 3.43 MIL/uL — ABNORMAL LOW (ref 3.87–5.11)
RDW: 15.7 % — ABNORMAL HIGH (ref 11.5–15.5)
WBC: 8.6 10*3/uL (ref 4.0–10.5)
nRBC: 0 % (ref 0.0–0.2)

## 2022-11-27 LAB — CULTURE, BLOOD (ROUTINE X 2): Special Requests: ADEQUATE

## 2022-11-27 MED ORDER — BUDESONIDE 0.5 MG/2ML IN SUSP
0.5000 mg | Freq: Two times a day (BID) | RESPIRATORY_TRACT | Status: DC
  Administered 2022-11-27 – 2022-11-29 (×5): 0.5 mg via RESPIRATORY_TRACT
  Filled 2022-11-27 (×5): qty 2

## 2022-11-27 NOTE — TOC Initial Note (Signed)
Transition of Care [name]) - Initial/Assessment Note    Patient Details  Name: Lindsey Butler MRN: [id number] Date of Birth: May 02, 1929  Transition of Care (TOC) CM/SW Contact:    Catalina Gravel, LCSW Phone Number: 11/27/2022, 2:49 PM  Clinical Narrative:                 Pt from [facility].  DC Monday.  CSW contacted Keshia at [facility] to advise potential return.  She advised me to call [facility] [phone]. No answer message left.  Deanna Artis stated they may want to come to assess pt to determine they still can care for her prior to returning.     Barriers to Discharge: Continued Medical Work up   Patient Goals and CMS Choice            Expected Discharge Plan and Services                                              Prior Living Arrangements/Services                       Activities of Daily Living Home Assistive Devices/Equipment: Environmental consultant (specify type) ADL Screening (condition at time of admission) Patient's cognitive ability adequate to safely complete daily activities?: Yes Is the patient deaf or have difficulty hearing?: No Does the patient have difficulty seeing, even when wearing glasses/contacts?: Yes Does the patient have difficulty concentrating, remembering, or making decisions?: Yes Patient able to express need for assistance with ADLs?: Yes Does the patient have difficulty dressing or bathing?: Yes Independently performs ADLs?: No Communication: Needs assistance Is this a change from baseline?: Pre-admission baseline Dressing (OT): Needs assistance Is this a change from baseline?: Pre-admission baseline Grooming: Needs assistance Is this a change from baseline?: Pre-admission baseline Feeding: Independent with device (comment) Bathing: Needs assistance Is this a change from baseline?: Pre-admission baseline Toileting: Needs assistance Is this a change from baseline?: Pre-admission baseline In/Out Bed: Needs assistance Is  this a change from baseline?: Pre-admission baseline Walks in Home: Needs assistance Is this a change from baseline?: Pre-admission baseline Does the patient have difficulty walking or climbing stairs?: Yes Weakness of Legs: Both Weakness of Arms/Hands: Both  Permission Sought/Granted                  Emotional Assessment              Admission diagnosis:  Lobar pneumonia [J18.1] Patient Active Problem List   Diagnosis Date Noted   Lobar pneumonia 11/26/2022   Chronic heart failure with preserved ejection fraction (HFpEF) 09/13/2022   Benign hypertension with coincident congestive heart failure 09/13/2022   Aortic atherosclerosis 09/13/2022   Hyperlipidemia LDL goal <130 09/13/2022   Pelvic fracture 09/08/2022   Post-operative hypothyroidism 10/13/2019   Essential hypertension 10/13/2019   Major depression, recurrent, chronic 10/13/2019   Hiatal hernia with GERD without esophagitis 10/13/2019   Increased intraocular pressure, bilateral 10/13/2019   Lumbar spondylosis 05/01/2018   Spinal stenosis of lumbar region without neurogenic claudication 09/27/2016   Basal cell carcinoma of skin of other parts of face 08/29/2016   Neuralgia, neuritis or radiculitis 03/27/2014   Osteoporosis 10/28/2013   Hypothyroidism 07/17/2013   Asthma 09/14/2012   Diverticulosis of colon 05/05/2011   PCP:  Toma Deiters, MD Pharmacy:   [facility],  [place] - [address] [place] [address] Phone: [phone] Fax: [phone]     Social Determinants of Health (SDOH) Social History: SDOH Screenings   Food Insecurity: No Food Insecurity (11/26/2022)  Housing: Low Risk  (11/26/2022)  Transportation Needs: No Transportation Needs (11/26/2022)  Utilities: Not At Risk (11/26/2022)  Tobacco Use: Low Risk  (11/26/2022)   SDOH Interventions:     Readmission Risk Interventions     No data to display

## 2022-11-27 NOTE — Progress Notes (Signed)
PROGRESS NOTE  Lindsey Butler RUE:[id number] DOB: 1929/02/07 DOA: 11/26/2022 PCP: Toma Deiters, MD  Brief History:  89 year old female with a history of hypertension, HFpEF, COPD/asthma, GERD, hyperlipidemia, hypothyroidism, large hiatus hernia presenting with coughing and shortness of breath.  The patient states that she has been short of breath for about a year, but it has worsened in the past day associated with increasing cough and chest congestion.  She has had some nausea without any emesis.  She denies any fevers, chills, headache, chest pain, hemoptysis, vomiting, diarrhea, abdominal pain.  She denies any dysuria or hematuria. Notably, the patient was recently admitted to the hospital from 09/08/2022 to 09/09/2022 after mechanical fall sustaining a pelvic fracture.  She was subsequently discharged to ALF where she has remained since then.  At baseline now, the patient is able to ambulate with a walker.  She is able to make transfers with minimal assistance.  She presently denies any hip pain or leg pain.  She denies any recent falls. In the ED, the patient was afebrile and hemodynamically stable with oxygen saturation 93% on room air.  The patient was placed on nasal cannula for discomfort. CTA chest was negative for pulmonary embolus.  This showed a small new right pleural effusion with right lower lobe opacity.  There was a large hiatus hernia.  There is chronic T8 and L1 fractures.  There is a chronic manubrium and right fifth rib fracture. WBC 11.4, hemoglobin 10.4, platelets 252,000.  Sodium 131, potassium 4.0, bicarbonate 22, serum creatinine 0.90.  The patient was started on ceftriaxone and azithromycin.   Assessment/Plan: Lobar pneumonia -Continue ceftriaxone and azithromycin -MRSA screen -Judicious IV fluids -Urine Legionella antigen--pending -Urine Streptococcus pneumoniae antigen--neg -Lactic acid 0.8 -4/20 CTA chest--neg PE, RLL opacity -viral resp  panel>>enterovirus -COVID-neg  Bacteremia--GNR -4/20 blood culture--GNR -continue ceftriaxone 2 grams daily pending susceptibility   COPD/asthma -continue Pulmicort -continue DuoNebs -continue Brovana   Pyuria -Obtain urine culture -Patient already on ceftriaxone for pneumonia   Chronic HFpEF -Appears clinically euvolemic -echo 03/09/2020, reporting ejection fraction 65-70%, global grade 1    Essential hypertension -Continue losartan   Mixed hyperlipidemia -Continue Crestor   Depression/anxiety -Continue Zoloft   Hypothyroidism -Continue Synthroid   Hiatal hernia with GERD -Continue pantoprazole     Family Communication:   grandson at bedside updated 4/21  Consultants:  none  Code Status:  DNR  DVT Prophylaxis:  [place] Lovenox   Procedures: As Listed in Progress Note Above  Antibiotics: Ceftriaxone 4/20>> Azithro 4/20>>      Subjective: She complains of dry cough.  Denies f/c, cp, n/v/d, abd pain  Objective: Vitals:   11/27/22 0429 11/27/22 0914 11/27/22 1409 11/27/22 1447  BP: (!) 170/69   (!) 122/55  Pulse: 75   84  Resp:      Temp: 97.6 F (36.4 C)   98.3 F (36.8 C)  TempSrc: Oral   Oral  SpO2: 99% 97% 96% 96%  Weight:      Height:        Intake/Output Summary (Last 24 hours) at 11/27/2022 1603 Last data filed at 11/27/2022 0900 Gross per 24 hour  Intake 220 ml  Output 700 ml  Net -480 ml   Weight change:  Exam:  General:  Pt is alert, follows commands appropriately, not in acute distress HEENT: No icterus, No thrush, No neck mass, [place]/AT Cardiovascular: RRR, S1/S2, no rubs, no gallops Respiratory: bibasilar rales, R>L, bi  wgeeze Abdomen: Soft/+BS, non tender, non distended, no guarding Extremities: No edema, No lymphangitis, No petechiae, No rashes, no synovitis   Data Reviewed: I have personally reviewed following labs and imaging studies Basic Metabolic Panel: Recent Labs  Lab 11/26/22 0717 11/27/22 0636  NA 131* 136   K 4.0 4.3  CL 100 102  CO2 22 25  GLUCOSE 106* 89  BUN 16 12  CREATININE 0.90 0.72  CALCIUM 9.1 9.6   Liver Function Tests: No results for input(s): "AST", "ALT", "ALKPHOS", "BILITOT", "PROT", "ALBUMIN" in the last 168 hours. No results for input(s): "LIPASE", "AMYLASE" in the last 168 hours. No results for input(s): "AMMONIA" in the last 168 hours. Coagulation Profile: No results for input(s): "INR", "PROTIME" in the last 168 hours. CBC: Recent Labs  Lab 11/26/22 0717 11/27/22 0636  WBC 11.4* 8.6  NEUTROABS 9.4*  --   HGB 10.4* 10.6*  HCT 33.6* 34.0*  MCV 98.2 99.1  PLT 252 264   Cardiac Enzymes: No results for input(s): "CKTOTAL", "CKMB", "CKMBINDEX", "TROPONINI" in the last 168 hours. BNP: Invalid input(s): "POCBNP" CBG: No results for input(s): "GLUCAP" in the last 168 hours. HbA1C: No results for input(s): "HGBA1C" in the last 72 hours. Urine analysis:    Component Value Date/Time   COLORURINE YELLOW 11/26/2022 0946   APPEARANCEUR CLEAR 11/26/2022 0946   LABSPEC 1.008 11/26/2022 0946   PHURINE 7.0 11/26/2022 0946   GLUCOSEU NEGATIVE 11/26/2022 0946   HGBUR NEGATIVE 11/26/2022 0946   BILIRUBINUR NEGATIVE 11/26/2022 0946   KETONESUR NEGATIVE 11/26/2022 0946   PROTEINUR NEGATIVE 11/26/2022 0946   NITRITE NEGATIVE 11/26/2022 0946   LEUKOCYTESUR MODERATE (A) 11/26/2022 0946   Sepsis Labs: (procalcitonin:4,lacticidven:4) ) Recent Results (from the past 240 hour(s))  SARS Coronavirus 2 by RT PCR (hospital order, performed in [facility] hospital lab) *cepheid single result test* Anterior Nasal Swab     Status: None   Collection Time: 11/26/22  7:11 AM   Specimen: Anterior Nasal Swab  Result Value Ref Range Status   SARS Coronavirus 2 by RT PCR NEGATIVE NEGATIVE Final    Comment: (NOTE) SARS-CoV-2 target nucleic acids are NOT DETECTED.  The SARS-CoV-2 RNA is generally detectable in upper and lower respiratory specimens during the acute phase of  infection. The lowest concentration of SARS-CoV-2 viral copies this assay can detect is 250 copies / mL. A negative result does not preclude SARS-CoV-2 infection and should not be used as the sole basis for treatment or other patient management decisions.  A negative result may occur with improper specimen collection / handling, submission of specimen other than nasopharyngeal swab, presence of viral mutation(s) within the areas targeted by this assay, and inadequate number of viral copies (<250 copies / mL). A negative result must be combined with clinical observations, patient history, and epidemiological information.  Fact Sheet for Patients:   RoadLapTop.co.za  Fact Sheet for Healthcare Providers: http://kim-miller.com/  This test is not yet approved or  cleared by the [place] FDA and has been authorized for detection and/or diagnosis of SARS-CoV-2 by FDA under an Emergency Use Authorization (EUA).  This EUA will remain in effect (meaning this test can be used) for the duration of the COVID-19 declaration under Section 564(b)(1) of the Act, 21 U.S.C. section 360bbb-3(b)(1), unless the authorization is terminated or revoked sooner.  Performed at [facility], [address] [place], [place] [address]   Culture, blood (routine x 2)     Status: None (Preliminary result)   Collection Time: 11/26/22  7:17 AM   Specimen: BLOOD LEFT HAND  Result Value Ref Range Status   Specimen Description   Final    BLOOD LEFT HAND BOTTLES DRAWN AEROBIC AND ANAEROBIC   Special Requests   Final    Blood Culture results may not be optimal due to an excessive volume of blood received in culture bottles   Culture  Setup Time   Final    GRAM NEGATIVE RODS Gram Stain Report Called to,Read Back By and Verified With: B. ROBERTSON @ 1331 BY STEPHTR 11/27/22 Performed at [facility], [address], [place], [place] [address]    Culture PENDING  Incomplete    Report Status PENDING  Incomplete  Culture, blood (routine x 2)     Status: None (Preliminary result)   Collection Time: 11/26/22  9:10 AM   Specimen: BLOOD RIGHT HAND  Result Value Ref Range Status   Specimen Description BLOOD RIGHT HAND AEROBIC BOTTLE ONLY  Final   Special Requests Blood Culture adequate volume  Final   Culture   Final    NO GROWTH < 24 HOURS Performed at [facility], [address], [place], [place] [address]    Report Status PENDING  Incomplete  MRSA Next Gen by PCR, Nasal     Status: None   Collection Time: 11/26/22  3:25 PM   Specimen: Nasal Mucosa; Nasal Swab  Result Value Ref Range Status   MRSA by PCR Next Gen NOT DETECTED NOT DETECTED Final    Comment: (NOTE) The GeneXpert MRSA Assay (FDA approved for NASAL specimens only), is one component of a comprehensive MRSA colonization surveillance program. It is not intended to diagnose MRSA infection nor to guide or monitor treatment for MRSA infections. Test performance is not FDA approved in patients less than 49 years old. Performed at [facility], [address], [place], [place] [address]   Respiratory (~20 pathogens) panel by PCR     Status: Abnormal   Collection Time: 11/26/22  3:25 PM   Specimen: Nasopharyngeal Swab; Respiratory  Result Value Ref Range Status   Adenovirus NOT DETECTED NOT DETECTED Final   Coronavirus 229E NOT DETECTED NOT DETECTED Final    Comment: (NOTE) The Coronavirus on the Respiratory Panel, DOES NOT test for the novel  Coronavirus (2019 nCoV)    Coronavirus HKU1 NOT DETECTED NOT DETECTED Final   Coronavirus NL63 NOT DETECTED NOT DETECTED Final   Coronavirus OC43 NOT DETECTED NOT DETECTED Final   Metapneumovirus NOT DETECTED NOT DETECTED Final   Rhinovirus / Enterovirus DETECTED (A) NOT DETECTED Final   Influenza A NOT DETECTED NOT DETECTED Final   Influenza B NOT DETECTED NOT DETECTED Final   Parainfluenza Virus 1 NOT DETECTED NOT DETECTED Final   Parainfluenza Virus 2  NOT DETECTED NOT DETECTED Final   Parainfluenza Virus 3 NOT DETECTED NOT DETECTED Final   Parainfluenza Virus 4 NOT DETECTED NOT DETECTED Final   Respiratory Syncytial Virus NOT DETECTED NOT DETECTED Final   Bordetella pertussis NOT DETECTED NOT DETECTED Final   Bordetella Parapertussis NOT DETECTED NOT DETECTED Final   Chlamydophila pneumoniae NOT DETECTED NOT DETECTED Final   Mycoplasma pneumoniae NOT DETECTED NOT DETECTED Final    Comment: Performed at [facility], [address], [place], [place] [address]     Scheduled Meds:  arformoterol  15 mcg Nebulization BID   azithromycin  500 mg Oral Daily   budesonide  0.5 mg Nebulization BID   cholecalciferol  1,000 Units Oral Daily   cyanocobalamin  1,000 mcg Oral Daily   enoxaparin (LOVENOX) injection  40 mg Subcutaneous Q24H   ipratropium-albuterol  3 mL Nebulization Q6H   levothyroxine  100 mcg Oral Daily   losartan  50 mg Oral Daily   montelukast  10 mg Oral QHS   pantoprazole  40 mg Oral Daily   predniSONE  5 mg Oral Daily   rosuvastatin  10 mg Oral QHS   senna  1 tablet Oral BID   sertraline  50 mg Oral Daily   Continuous Infusions:  cefTRIAXone (ROCEPHIN)  IV 2 g (11/27/22 0834)    Procedures/Studies: CT Angio Chest PE W/Cm &/Or Wo Cm  Result Date: 11/26/2022 CLINICAL DATA:  89 year old female with shortness of breath since 0430 hours. Congestion. EXAM: CT ANGIOGRAPHY CHEST WITH CONTRAST TECHNIQUE: Multidetector CT imaging of the chest was performed using the standard protocol during bolus administration of intravenous contrast. Multiplanar CT image reconstructions and MIPs were obtained to evaluate the vascular anatomy. RADIATION DOSE REDUCTION: This exam was performed according to the departmental dose-optimization program which includes automated exposure control, adjustment of the mA and/or kV according to patient size and/or use of iterative reconstruction technique. CONTRAST:  75mL OMNIPAQUE IOHEXOL 350 MG/ML SOLN  COMPARISON:  Chest CTA 03/02/2021.  Portable chest 0706 hours today. FINDINGS: Cardiovascular: Good contrast bolus timing in the pulmonary arterial tree. No pulmonary artery filling defect. Chronic Calcified aortic atherosclerosis, calcified coronary artery atherosclerosis. Stable heart size since 2022, borderline to mild enlarged. No pericardial effusion. Mediastinum/Nodes: Large chronic stomach, mesentery, and proximal duodenum containing hiatal hernia. This is larger since 2022 and now also contains a segment of the transverse colon and transverse mesocolon. No superimposed mediastinal mass or lymphadenopathy. Lungs/Pleura: Lower lung volumes compared to 2022. New small layering right pleural effusion. Partially enhancing confluent medial basal segment, posterior basal segment right lower lobe opacity. In part this resembles compressive atelectasis. Major airways remain patent. More typical appearing middle lobe and left lower lobe atelectasis. Upper Abdomen: Negative visible liver, spleen, pancreas, adrenal glands and kidneys. Aside from large hiatal hernia and diverticulosis the visible upper bowel is negative. No free air or free fluid in the visible upper abdomen. Musculoskeletal: Chronic T8 and L1 compression fractures do not appear significantly changed from 2022. Chronic manubrium fracture also not significantly changed. Underlying osteopenia. Lateral right 5th rib fracture is mildly displaced and new since 2022 although appears chronic and partially healed. No acute osseous abnormality identified. Review of the MIP images confirms the above findings. IMPRESSION: 1. Negative for acute pulmonary embolus. 2. Small new layering right pleural effusion and right lower lobe opacity indeterminate for atelectasis (see #3) versus mild Pneumonia. 3. Superimposed very large hiatal hernia has increased since 2022, and now contains a segment of the transverse colon and related mesentery, in addition to the stomach and  proximal duodenum. 4. Chronic T8 and L1 compression fractures. Chronic manubrium and right 5th rib fractures. 5.  Aortic Atherosclerosis (ICD10-I70.0). Electronically Signed   By: Odessa Fleming M.D.   On: 11/26/2022 09:52   DG Chest Port 1 View  Result Date: 11/26/2022 CLINICAL DATA:  89 year old female with shortness of breath. Cough and congestion. EXAM: PORTABLE CHEST 1 VIEW COMPARISON:  Chest radiographs 08/26/2021 and earlier. FINDINGS: Portable AP upright view at 0706 hours. Large chronic gastric hiatal hernia. Stable cardiac size and mediastinal contours. Calcified aortic atherosclerosis. Lower lung volumes compared to last year. No pneumothorax or pulmonary edema. Hiatal hernia associated lung base atelectasis. No convincing pleural effusion or consolidation. Chronic  appearing right lateral 4th rib fracture. No acute osseous abnormality identified. Negative visible bowel gas. IMPRESSION: Lower lung volumes. Large chronic gastric hiatal hernia, with associated atelectasis. No other acute cardiopulmonary abnormality. Electronically Signed   By: Odessa Fleming M.D.   On: 11/26/2022 07:23    Catarina Hartshorn, DO  [facility]  If 7PM-7AM, please contact night-coverage [email or web address] Password [email or web address] 11/27/2022, 4:03 PM   LOS: 1 day

## 2022-11-28 DIAGNOSIS — I10 Essential (primary) hypertension: Secondary | ICD-10-CM | POA: Diagnosis not present

## 2022-11-28 DIAGNOSIS — J181 Lobar pneumonia, unspecified organism: Secondary | ICD-10-CM | POA: Diagnosis not present

## 2022-11-28 DIAGNOSIS — R7881 Bacteremia: Secondary | ICD-10-CM | POA: Diagnosis not present

## 2022-11-28 DIAGNOSIS — I5032 Chronic diastolic (congestive) heart failure: Secondary | ICD-10-CM | POA: Diagnosis not present

## 2022-11-28 LAB — CBC
HCT: 30.7 % — ABNORMAL LOW (ref 36.0–46.0)
Hemoglobin: 9.7 g/dL — ABNORMAL LOW (ref 12.0–15.0)
MCH: 31.3 pg (ref 26.0–34.0)
MCHC: 31.6 g/dL (ref 30.0–36.0)
MCV: 99 fL (ref 80.0–100.0)
Platelets: 276 10*3/uL (ref 150–400)
RBC: 3.1 MIL/uL — ABNORMAL LOW (ref 3.87–5.11)
RDW: 15.7 % — ABNORMAL HIGH (ref 11.5–15.5)
WBC: 8 10*3/uL (ref 4.0–10.5)
nRBC: 0 % (ref 0.0–0.2)

## 2022-11-28 LAB — URINE CULTURE: Culture: >=100000 — AB

## 2022-11-28 LAB — BASIC METABOLIC PANEL
Anion gap: 7 (ref 5–15)
BUN: 12 mg/dL (ref 8–23)
CO2: 24 mmol/L (ref 22–32)
Calcium: 9.3 mg/dL (ref 8.9–10.3)
Chloride: 103 mmol/L (ref 98–111)
Creatinine, Ser: 0.81 mg/dL (ref 0.44–1.00)
GFR, Estimated: >60 mL/min (ref >60)
Glucose, Bld: 83 mg/dL (ref 70–99)
Potassium: 4.1 mmol/L (ref 3.5–5.1)
Sodium: 134 mmol/L — ABNORMAL LOW (ref 135–145)

## 2022-11-28 LAB — LEGIONELLA PNEUMOPHILA SEROGP 1 UR AG: L. pneumophila Serogp 1 Ur Ag: NEGATIVE

## 2022-11-28 LAB — CULTURE, BLOOD (ROUTINE X 2)

## 2022-11-28 LAB — PROCALCITONIN: Procalcitonin: 0.69 ng/mL

## 2022-11-28 LAB — MAGNESIUM: Magnesium: 2 mg/dL (ref 1.7–2.4)

## 2022-11-28 MED ORDER — IPRATROPIUM-ALBUTEROL 0.5-2.5 (3) MG/3ML IN SOLN
3.0000 mL | Freq: Four times a day (QID) | RESPIRATORY_TRACT | Status: DC
  Administered 2022-11-28 (×3): 3 mL via RESPIRATORY_TRACT
  Filled 2022-11-28 (×3): qty 3

## 2022-11-28 NOTE — Progress Notes (Signed)
PROGRESS NOTE  Lindsey Butler ZOX:[id number] DOB: April 08, 1929 DOA: 11/26/2022 PCP: Toma Deiters, MD  Brief History:  89 year old female with a history of hypertension, HFpEF, COPD/asthma, GERD, hyperlipidemia, hypothyroidism, large hiatus hernia presenting with coughing and shortness of breath.  The patient states that she has been short of breath for about a year, but it has worsened in the past day associated with increasing cough and chest congestion.  She has had some nausea without any emesis.  She denies any fevers, chills, headache, chest pain, hemoptysis, vomiting, diarrhea, abdominal pain.  She denies any dysuria or hematuria. Notably, the patient was recently admitted to the hospital from 09/08/2022 to 09/09/2022 after mechanical fall sustaining a pelvic fracture.  She was subsequently discharged to ALF where she has remained since then.  At baseline now, the patient is able to ambulate with a walker.  She is able to make transfers with minimal assistance.  She presently denies any hip pain or leg pain.  She denies any recent falls. In the ED, the patient was afebrile and hemodynamically stable with oxygen saturation 93% on room air.  The patient was placed on nasal cannula for discomfort. CTA chest was negative for pulmonary embolus.  This showed a small new right pleural effusion with right lower lobe opacity.  There was a large hiatus hernia.  There is chronic T8 and L1 fractures.  There is a chronic manubrium and right fifth rib fracture. WBC 11.4, hemoglobin 10.4, platelets 252,000.  Sodium 131, potassium 4.0, bicarbonate 22, serum creatinine 0.90.  The patient was started on ceftriaxone and azithromycin.   Assessment/Plan: Lobar pneumonia -Continue ceftriaxone and azithromycin -MRSA screen--neg -Judicious IV fluids -Urine Legionella antigen--pending -Urine Streptococcus pneumoniae antigen--neg -Lactic acid 0.8 -4/20 CTA chest--neg PE, RLL opacity -viral resp  panel>>enterovirus -COVID-neg   Bacteremia--E coli -4/20 blood culture--E coli -continue ceftriaxone 2 grams daily pending susceptibility   COPD/asthma -continue Pulmicort -continue DuoNebs -continue Brovana   UTI -UA 21-50 WBC -Obtain urine culture--E. coi -Patient already on ceftriaxone for pneumonia   Chronic HFpEF -Appears clinically euvolemic -echo 03/09/2020, reporting ejection fraction 65-70%, global grade 1    Essential hypertension -Continue losartan   Mixed hyperlipidemia -Continue Crestor   Depression/anxiety -Continue Zoloft   Hypothyroidism -Continue Synthroid   Hiatal hernia with GERD -Continue pantoprazole       Family Communication:   grandson at bedside updated 4/21   Consultants:  none   Code Status:  DNR   DVT Prophylaxis:  [place] Lovenox     Procedures: As Listed in Progress Note Above   Antibiotics: Ceftriaxone 4/20>> Azithro 4/20>>           Subjective: Patient denies fevers, chills, headache, chest pain, dyspnea, nausea, vomiting, diarrhea, abdominal pain, dysuria, hematuria, hematochezia, and melena.   Objective: Vitals:   11/28/22 0700 11/28/22 0857 11/28/22 1334 11/28/22 1347  BP:  (!) 145/69 (!) 104/47   Pulse:  79 85   Resp:   18   Temp:   98.1 F (36.7 C)   TempSrc:   Oral   SpO2: 99% 100% 98% 98%  Weight:      Height:        Intake/Output Summary (Last 24 hours) at 11/28/2022 1709 Last data filed at 11/28/2022 1645 Gross per 24 hour  Intake 600 ml  Output 1200 ml  Net -600 ml   Weight change:  Exam:  General:  Pt is alert, follows commands appropriately, not  in acute distress HEENT: No icterus, No thrush, No neck mass, [place]/AT Cardiovascular: RRR, S1/S2, no rubs, no gallops Respiratory: bibasilar rales. No wheeze Abdomen: Soft/+BS, non tender, non distended, no guarding Extremities: No edema, No lymphangitis, No petechiae, No rashes, no synovitis   Data Reviewed: I have personally reviewed following  labs and imaging studies Basic Metabolic Panel: Recent Labs  Lab 11/26/22 0717 11/27/22 0636 11/28/22 0434  NA 131* 136 134*  K 4.0 4.3 4.1  CL 100 102 103  CO2 GLUCOSE 106* 89 83  BUN CREATININE 0.90 0.72 0.81  CALCIUM 9.1 9.6 9.3  MG  --   --  2.0   Liver Function Tests: No results for input(s): "AST", "ALT", "ALKPHOS", "BILITOT", "PROT", "ALBUMIN" in the last 168 hours. No results for input(s): "LIPASE", "AMYLASE" in the last 168 hours. No results for input(s): "AMMONIA" in the last 168 hours. Coagulation Profile: No results for input(s): "INR", "PROTIME" in the last 168 hours. CBC: Recent Labs  Lab 11/26/22 0717 11/27/22 0636 11/28/22 0434  WBC 11.4* 8.6 8.0  NEUTROABS 9.4*  --   --   HGB 10.4* 10.6* 9.7*  HCT 33.6* 34.0* 30.7*  MCV 98.2 99.1 99.0  PLT 252 264 276   Cardiac Enzymes: No results for input(s): "CKTOTAL", "CKMB", "CKMBINDEX", "TROPONINI" in the last 168 hours. BNP: Invalid input(s): "POCBNP" CBG: No results for input(s): "GLUCAP" in the last 168 hours. HbA1C: No results for input(s): "HGBA1C" in the last 72 hours. Urine analysis:    Component Value Date/Time   COLORURINE YELLOW 11/26/2022 0946   APPEARANCEUR CLEAR 11/26/2022 0946   LABSPEC 1.008 11/26/2022 0946   PHURINE 7.0 11/26/2022 0946   GLUCOSEU NEGATIVE 11/26/2022 0946   HGBUR NEGATIVE 11/26/2022 0946   BILIRUBINUR NEGATIVE 11/26/2022 0946   KETONESUR NEGATIVE 11/26/2022 0946   PROTEINUR NEGATIVE 11/26/2022 0946   NITRITE NEGATIVE 11/26/2022 0946   LEUKOCYTESUR MODERATE (A) 11/26/2022 0946   Sepsis Labs: (procalcitonin:4,lacticidven:4) ) Recent Results (from the past 240 hour(s))  SARS Coronavirus 2 by RT PCR (hospital order, performed in [facility] hospital lab) *cepheid single result test* Anterior Nasal Swab     Status: None   Collection Time: 11/26/22  7:11 AM   Specimen: Anterior Nasal Swab  Result Value Ref Range Status   SARS Coronavirus 2  by RT PCR NEGATIVE NEGATIVE Final    Comment: (NOTE) SARS-CoV-2 target nucleic acids are NOT DETECTED.  The SARS-CoV-2 RNA is generally detectable in upper and lower respiratory specimens during the acute phase of infection. The lowest concentration of SARS-CoV-2 viral copies this assay can detect is 250 copies / mL. A negative result does not preclude SARS-CoV-2 infection and should not be used as the sole basis for treatment or other patient management decisions.  A negative result may occur with improper specimen collection / handling, submission of specimen other than nasopharyngeal swab, presence of viral mutation(s) within the areas targeted by this assay, and inadequate number of viral copies (<250 copies / mL). A negative result must be combined with clinical observations, patient history, and epidemiological information.  Fact Sheet for Patients:   RoadLapTop.co.za  Fact Sheet for Healthcare Providers: http://kim-miller.com/  This test is not yet approved or  cleared by the [place] FDA and has been authorized for detection and/or diagnosis of SARS-CoV-2 by FDA under an Emergency Use Authorization (EUA).  This EUA will remain in effect (meaning this test can be used) for the duration of the  COVID-19 declaration under Section 564(b)(1) of the Act, 21 U.S.C. section 360bbb-3(b)(1), unless the authorization is terminated or revoked sooner.  Performed at [facility], [address] [place], [place] [address]   Culture, blood (routine x 2)     Status: Abnormal (Preliminary result)   Collection Time: 11/26/22  7:17 AM   Specimen: BLOOD LEFT HAND  Result Value Ref Range Status   Specimen Description   Final    BLOOD LEFT HAND BOTTLES DRAWN AEROBIC AND ANAEROBIC Performed at [facility], [address] [place], [place] [address]    Special Requests   Final    Blood Culture results may not be optimal due to an excessive  volume of blood received in culture bottles Performed at [facility], [address] [place], [place] [address]    Culture  Setup Time   Final    GRAM NEGATIVE RODS Gram Stain Report Called to,Read Back By and Verified With: B. ROBERTSON @ 1331 BY STEPHTR 11/27/22 AEROBIC BOTTLE ONLY  REVIEWED BY A. LAFRANCE CRITICAL RESULT CALLED TO, READ BACK BY AND VERIFIED WITH:  Maebelle Munroe, RN 11/27/22 1917 A. LAFRANCE    Culture (A)  Final    ESCHERICHIA COLI SUSCEPTIBILITIES TO FOLLOW Performed at [facility], [address], [place], [place] [address]    Report Status PENDING  Incomplete  Blood Culture ID Panel (Reflexed)     Status: Abnormal   Collection Time: 11/26/22  7:17 AM  Result Value Ref Range Status   Enterococcus faecalis NOT DETECTED NOT DETECTED Final   Enterococcus Faecium NOT DETECTED NOT DETECTED Final   Listeria monocytogenes NOT DETECTED NOT DETECTED Final   Staphylococcus species NOT DETECTED NOT DETECTED Final   Staphylococcus aureus (BCID) NOT DETECTED NOT DETECTED Final   Staphylococcus epidermidis NOT DETECTED NOT DETECTED Final   Staphylococcus lugdunensis NOT DETECTED NOT DETECTED Final   Streptococcus species NOT DETECTED NOT DETECTED Final   Streptococcus agalactiae NOT DETECTED NOT DETECTED Final   Streptococcus pneumoniae NOT DETECTED NOT DETECTED Final   Streptococcus pyogenes NOT DETECTED NOT DETECTED Final   A.calcoaceticus-baumannii NOT DETECTED NOT DETECTED Final   Bacteroides fragilis NOT DETECTED NOT DETECTED Final   Enterobacterales DETECTED (A) NOT DETECTED Final    Comment: Enterobacterales represent a large order of gram negative bacteria, not a single organism. CRITICAL RESULT CALLED TO, READ BACK BY AND VERIFIED WITH:  Maebelle Munroe, RN 11/27/22 1917 A. LAFRANCE    Enterobacter cloacae complex NOT DETECTED NOT DETECTED Final   Escherichia coli DETECTED (A) NOT DETECTED Final    Comment: CRITICAL RESULT CALLED TO, READ BACK BY AND  VERIFIED WITH:  Maebelle Munroe, RN 11/27/22 1917 A. LAFRANCE    Klebsiella aerogenes NOT DETECTED NOT DETECTED Final   Klebsiella oxytoca NOT DETECTED NOT DETECTED Final   Klebsiella pneumoniae NOT DETECTED NOT DETECTED Final   Proteus species NOT DETECTED NOT DETECTED Final   Salmonella species NOT DETECTED NOT DETECTED Final   Serratia marcescens NOT DETECTED NOT DETECTED Final   Haemophilus influenzae NOT DETECTED NOT DETECTED Final   Neisseria meningitidis NOT DETECTED NOT DETECTED Final   Pseudomonas aeruginosa NOT DETECTED NOT DETECTED Final   Stenotrophomonas maltophilia NOT DETECTED NOT DETECTED Final   Candida albicans NOT DETECTED NOT DETECTED Final   Candida auris NOT DETECTED NOT DETECTED Final   Candida glabrata NOT DETECTED NOT DETECTED Final   Candida krusei NOT DETECTED NOT DETECTED Final   Candida parapsilosis NOT DETECTED NOT DETECTED Final  Candida tropicalis NOT DETECTED NOT DETECTED Final   Cryptococcus neoformans/gattii NOT DETECTED NOT DETECTED Final   CTX-M ESBL NOT DETECTED NOT DETECTED Final   Carbapenem resistance IMP NOT DETECTED NOT DETECTED Final   Carbapenem resistance KPC NOT DETECTED NOT DETECTED Final   Carbapenem resistance NDM NOT DETECTED NOT DETECTED Final   Carbapenem resist OXA 48 LIKE NOT DETECTED NOT DETECTED Final   Carbapenem resistance VIM NOT DETECTED NOT DETECTED Final    Comment: Performed at [facility], [address] [place], [place] [address]  Culture, blood (routine x 2)     Status: None (Preliminary result)   Collection Time: 11/26/22  9:10 AM   Specimen: BLOOD RIGHT HAND  Result Value Ref Range Status   Specimen Description   Final    BLOOD RIGHT HAND AEROBIC BOTTLE ONLY Performed at [facility], [address] [place], [place] [address]    Special Requests   Final    Blood Culture adequate volume Performed at [facility], [address], [place], [place] [address]    Culture  Setup Time   Final    GRAM NEGATIVE  RODS Gram Stain Report Called to,Read Back By and Verified With: BILAL, F AT 2015 ON 11/27/22 BY SMN. AEROBIC BOTTLE ONLY CRITICAL VALUE NOTED.  VALUE IS CONSISTENT WITH PREVIOUSLY REPORTED AND CALLED VALUE.    Culture   Final    GRAM NEGATIVE RODS IDENTIFICATION TO FOLLOW Performed at [facility], [address] [place], [place] [address]    Report Status PENDING  Incomplete  Urine Culture (for pregnant, neutropenic or urologic patients or patients with an indwelling urinary catheter)     Status: Abnormal (Preliminary result)   Collection Time: 11/26/22  9:46 AM   Specimen: Urine, Clean Catch  Result Value Ref Range Status   Specimen Description   Final    URINE, CLEAN CATCH Performed at [facility], [address], [place], [place] [address]    Special Requests   Final    NONE Performed at [facility] [address], [place], [place] [address]    Culture >=100,000 COLONIES/mL ESCHERICHIA COLI (A)  Final   Report Status PENDING  Incomplete  MRSA Next Gen by PCR, Nasal     Status: None   Collection Time: 11/26/22  3:25 PM   Specimen: Nasal Mucosa; Nasal Swab  Result Value Ref Range Status   MRSA by PCR Next Gen NOT DETECTED NOT DETECTED Final    Comment: (NOTE) The GeneXpert MRSA Assay (FDA approved for NASAL specimens only), is one component of a comprehensive MRSA colonization surveillance program. It is not intended to diagnose MRSA infection nor to guide or monitor treatment for MRSA infections. Test performance is not FDA approved in patients less than 31 years old. Performed at [facility], [address], [place], [place] [address]   Respiratory (~20 pathogens) panel by PCR     Status: Abnormal   Collection Time: 11/26/22  3:25 PM   Specimen: Nasopharyngeal Swab; Respiratory  Result Value Ref Range Status   Adenovirus NOT DETECTED NOT DETECTED Final   Coronavirus 229E NOT DETECTED NOT DETECTED Final    Comment: (NOTE) The Coronavirus on the Respiratory  Panel, DOES NOT test for the novel  Coronavirus (2019 nCoV)    Coronavirus HKU1 NOT DETECTED NOT DETECTED Final   Coronavirus NL63 NOT DETECTED NOT DETECTED Final   Coronavirus OC43 NOT DETECTED NOT DETECTED Final   Metapneumovirus NOT DETECTED NOT DETECTED Final  Rhinovirus / Enterovirus DETECTED (A) NOT DETECTED Final   Influenza A NOT DETECTED NOT DETECTED Final   Influenza B NOT DETECTED NOT DETECTED Final   Parainfluenza Virus 1 NOT DETECTED NOT DETECTED Final   Parainfluenza Virus 2 NOT DETECTED NOT DETECTED Final   Parainfluenza Virus 3 NOT DETECTED NOT DETECTED Final   Parainfluenza Virus 4 NOT DETECTED NOT DETECTED Final   Respiratory Syncytial Virus NOT DETECTED NOT DETECTED Final   Bordetella pertussis NOT DETECTED NOT DETECTED Final   Bordetella Parapertussis NOT DETECTED NOT DETECTED Final   Chlamydophila pneumoniae NOT DETECTED NOT DETECTED Final   Mycoplasma pneumoniae NOT DETECTED NOT DETECTED Final    Comment: Performed at [facility], [address], [place], [place] [address]     Scheduled Meds:  arformoterol  15 mcg Nebulization BID   azithromycin  500 mg Oral Daily   budesonide  0.5 mg Nebulization BID   cholecalciferol  1,000 Units Oral Daily   cyanocobalamin  1,000 mcg Oral Daily   enoxaparin (LOVENOX) injection  40 mg Subcutaneous Q24H   ipratropium-albuterol  3 mL Nebulization Q6H WA   levothyroxine  100 mcg Oral Daily   losartan  50 mg Oral Daily   montelukast  10 mg Oral QHS   pantoprazole  40 mg Oral Daily   predniSONE  5 mg Oral Daily   rosuvastatin  10 mg Oral QHS   senna  1 tablet Oral BID   sertraline  50 mg Oral Daily   Continuous Infusions:  cefTRIAXone (ROCEPHIN)  IV 2 g (11/28/22 0857)    Procedures/Studies: CT Angio Chest PE W/Cm &/Or Wo Cm  Result Date: 11/26/2022 CLINICAL DATA:  89 year old female with shortness of breath since 0430 hours. Congestion. EXAM: CT ANGIOGRAPHY CHEST WITH CONTRAST TECHNIQUE: Multidetector CT  imaging of the chest was performed using the standard protocol during bolus administration of intravenous contrast. Multiplanar CT image reconstructions and MIPs were obtained to evaluate the vascular anatomy. RADIATION DOSE REDUCTION: This exam was performed according to the departmental dose-optimization program which includes automated exposure control, adjustment of the mA and/or kV according to patient size and/or use of iterative reconstruction technique. CONTRAST:  75mL OMNIPAQUE IOHEXOL 350 MG/ML SOLN COMPARISON:  Chest CTA 03/02/2021.  Portable chest 0706 hours today. FINDINGS: Cardiovascular: Good contrast bolus timing in the pulmonary arterial tree. No pulmonary artery filling defect. Chronic Calcified aortic atherosclerosis, calcified coronary artery atherosclerosis. Stable heart size since 2022, borderline to mild enlarged. No pericardial effusion. Mediastinum/Nodes: Large chronic stomach, mesentery, and proximal duodenum containing hiatal hernia. This is larger since 2022 and now also contains a segment of the transverse colon and transverse mesocolon. No superimposed mediastinal mass or lymphadenopathy. Lungs/Pleura: Lower lung volumes compared to 2022. New small layering right pleural effusion. Partially enhancing confluent medial basal segment, posterior basal segment right lower lobe opacity. In part this resembles compressive atelectasis. Major airways remain patent. More typical appearing middle lobe and left lower lobe atelectasis. Upper Abdomen: Negative visible liver, spleen, pancreas, adrenal glands and kidneys. Aside from large hiatal hernia and diverticulosis the visible upper bowel is negative. No free air or free fluid in the visible upper abdomen. Musculoskeletal: Chronic T8 and L1 compression fractures do not appear significantly changed from 2022. Chronic manubrium fracture also not significantly changed. Underlying osteopenia. Lateral right 5th rib fracture is mildly displaced and new  since 2022 although appears chronic and partially healed. No acute osseous abnormality identified. Review of the MIP images confirms the above findings. IMPRESSION:  1. Negative for acute pulmonary embolus. 2. Small new layering right pleural effusion and right lower lobe opacity indeterminate for atelectasis (see #3) versus mild Pneumonia. 3. Superimposed very large hiatal hernia has increased since 2022, and now contains a segment of the transverse colon and related mesentery, in addition to the stomach and proximal duodenum. 4. Chronic T8 and L1 compression fractures. Chronic manubrium and right 5th rib fractures. 5.  Aortic Atherosclerosis (ICD10-I70.0). Electronically Signed   By: Odessa Fleming M.D.   On: 11/26/2022 09:52   DG Chest Port 1 View  Result Date: 11/26/2022 CLINICAL DATA:  89 year old female with shortness of breath. Cough and congestion. EXAM: PORTABLE CHEST 1 VIEW COMPARISON:  Chest radiographs 08/26/2021 and earlier. FINDINGS: Portable AP upright view at 0706 hours. Large chronic gastric hiatal hernia. Stable cardiac size and mediastinal contours. Calcified aortic atherosclerosis. Lower lung volumes compared to last year. No pneumothorax or pulmonary edema. Hiatal hernia associated lung base atelectasis. No convincing pleural effusion or consolidation. Chronic appearing right lateral 4th rib fracture. No acute osseous abnormality identified. Negative visible bowel gas. IMPRESSION: Lower lung volumes. Large chronic gastric hiatal hernia, with associated atelectasis. No other acute cardiopulmonary abnormality. Electronically Signed   By: Odessa Fleming M.D.   On: 11/26/2022 07:23    Catarina Hartshorn, DO  [facility]  If 7PM-7AM, please contact night-coverage [email or web address] Password [facility] 11/28/2022, 5:09 PM   LOS: 2 days

## 2022-11-28 NOTE — TOC Progression Note (Signed)
Transition of Care [facility]) - Progression Note    Patient Details  Name: Lindsey Butler MRN: [id number] Date of Birth: May 20, 1929  Transition of Care [facility]) CM/SW Contact  Karn [place], [place] Phone Number: 11/28/2022, 11:54 AM  Clinical Narrative: LCS[facility]. She indicates they will assess pt today. Chasity aware of possible d/c tomorrow. TOC will follow.         Barriers to Discharge: Continued Medical Work up  Expected Discharge Plan and Services                                               Social Determinants of Health (SDOH) Interventions SDOH Screenings   Food Insecurity: No Food Insecurity (11/26/2022)  Housing: Low Risk  (11/26/2022)  Transportation Needs: No Transportation Needs (11/26/2022)  Utilities: Not At Risk (11/26/2022)  Tobacco Use: Low Risk  (11/26/2022)    Readmission Risk Interventions     No data to display

## 2022-11-29 DIAGNOSIS — I10 Essential (primary) hypertension: Secondary | ICD-10-CM | POA: Diagnosis not present

## 2022-11-29 DIAGNOSIS — R7881 Bacteremia: Secondary | ICD-10-CM | POA: Diagnosis not present

## 2022-11-29 DIAGNOSIS — J181 Lobar pneumonia, unspecified organism: Secondary | ICD-10-CM | POA: Diagnosis not present

## 2022-11-29 DIAGNOSIS — I5032 Chronic diastolic (congestive) heart failure: Secondary | ICD-10-CM | POA: Diagnosis not present

## 2022-11-29 LAB — CULTURE, BLOOD (ROUTINE X 2)

## 2022-11-29 LAB — URINE CULTURE

## 2022-11-29 MED ORDER — CEFADROXIL 500 MG PO CAPS: 1000.0000 mg | ORAL_CAPSULE | Freq: Two times a day (BID) | ORAL | 0 refills | Status: DC

## 2022-11-29 MED ORDER — CEFDINIR 300 MG PO CAPS: 300.0000 mg | ORAL_CAPSULE | Freq: Two times a day (BID) | ORAL | 0 refills | Status: DC

## 2022-11-29 MED ORDER — IPRATROPIUM-ALBUTEROL 0.5-2.5 (3) MG/3ML IN SOLN
3.0000 mL | Freq: Three times a day (TID) | RESPIRATORY_TRACT | Status: DC
  Administered 2022-11-29 (×2): 3 mL via RESPIRATORY_TRACT
  Filled 2022-11-29 (×2): qty 3

## 2022-11-29 MED ORDER — CEFDINIR 300 MG PO CAPS: 300.0000 mg | ORAL_CAPSULE | Freq: Two times a day (BID) | ORAL | Status: DC

## 2022-11-29 NOTE — Care Management Important Message (Signed)
Important Message  Patient Details  Name: Lindsey Butler MRN: [id number] Date of Birth: [birth year]   Medicare Important Message Given:  Yes     Corey Harold 11/29/2022, 11:41 AM

## 2022-11-29 NOTE — Discharge Summary (Addendum)
Physician Discharge Summary   Patient: Lindsey Butler MRN: [id number] DOB: [birth year]  Admit date:     11/26/2022  Discharge date: 11/29/22  Discharge Physician: Onalee Hua Ednamae Schiano   PCP: Toma Deiters, MD   Recommendations at discharge:   Please follow up with primary care provider within 1-2 weeks  Please repeat BMP and CBC in one week      Hospital Course: 89 year old female with a history of hypertension, HFpEF, COPD/asthma, GERD, hyperlipidemia, hypothyroidism, large hiatus hernia presenting with coughing and shortness of breath.  The patient states that she has been short of breath for about a year, but it has worsened in the past day associated with increasing cough and chest congestion.  She has had some nausea without any emesis.  She denies any fevers, chills, headache, chest pain, hemoptysis, vomiting, diarrhea, abdominal pain.  She denies any dysuria or hematuria. Notably, the patient was recently admitted to the hospital from 09/08/2022 to 09/09/2022 after mechanical fall sustaining a pelvic fracture.  She was subsequently discharged to ALF where she has remained since then.  At baseline now, the patient is able to ambulate with a walker.  She is able to make transfers with minimal assistance.  She presently denies any hip pain or leg pain.  She denies any recent falls. In the ED, the patient was afebrile and hemodynamically stable with oxygen saturation 93% on room air.  The patient was placed on nasal cannula for discomfort. CTA chest was negative for pulmonary embolus.  This showed a small new right pleural effusion with right lower lobe opacity.  There was a large hiatus hernia.  There is chronic T8 and L1 fractures.  There is a chronic manubrium and right fifth rib fracture. WBC 11.4, hemoglobin 10.4, platelets 252,000.  Sodium 131, potassium 4.0, bicarbonate 22, serum creatinine 0.90.  The patient was started on ceftriaxone and azithromycin.  She improved clinically.  She remained  afebrile and hemodynamically stable and saturation 97-99% on RA.  Blood cultures and urine cultures grew E coli.  Her ceftriaxone was increased to 2 grams daily.  Once final culture data returned she was transitioned to po cefdinir to finish 10 day course of abx  Assessment and Plan: Lobar pneumonia -Continued ceftriaxone and azithromycin -received 4 days azithro -MRSA screen--neg -Judicious IV fluids>>saline locked -Urine Legionella antigen--neg -Urine Streptococcus pneumoniae antigen--neg -Lactic acid 0.8 -4/20 CTA chest--neg PE, RLL opacity -viral resp panel>>enterovirus/rhinovirus -COVID-neg -stable on RA   Bacteremia--E coli -4/20 blood culture--E coli -continued on ceftriaxone 2 grams daily -d/c with cefadroxil x 7 more days after d/c -remains afebrile and hemodynamically stable   COPD/asthma -continue Pulmicort -continue DuoNebs -continue Brovana   UTI -UA 21-50 WBC -Obtain urine culture--E. coi -Patient already on ceftriaxone for pneumonia -cefadroxil at time of d/c as discussed above   Chronic HFpEF -Appears clinically euvolemic -echo 03/09/2020, reporting ejection fraction 65-70%, global grade 1    Essential hypertension -Continue losartan   Mixed hyperlipidemia -Continue Crestor   Depression/anxiety -Continue Zoloft   Hypothyroidism -Continue Synthroid   Hiatal hernia with GERD -Continue pantoprazole       Consultants: none Procedures performed: none  Disposition: Assisted living Diet recommendation:  Cardiac diet DISCHARGE MEDICATION: Allergies as of 11/29/2022   No Known Allergies      Medication List     STOP taking these medications    oxyCODONE 5 MG immediate release tablet Commonly known as: Oxy IR/ROXICODONE       TAKE these medications  acetaminophen 500 MG tablet Commonly known as: TYLENOL Take 2 tablets (1,000 mg total) by mouth every 8 (eight) hours.   budesonide 0.5 MG/2ML nebulizer solution Commonly known as:  PULMICORT Take 2 mLs (0.5 mg total) by nebulization 2 (two) times daily.   calcium carbonate 1500 (600 Ca) MG Tabs tablet Commonly known as: OSCAL Take 1,500 mg by mouth daily with breakfast.   cefadroxil 500 MG capsule Commonly known as: DURICEF Take 2 capsules (1,000 mg total) by mouth 2 (two) times daily for 7 days.   cholecalciferol 25 MCG (1000 UNIT) tablet Commonly known as: VITAMIN D3 Take 1,000 Units by mouth daily.   cyanocobalamin 1000 MCG tablet Commonly known as: VITAMIN B12 Take 1,000 mcg by mouth daily.   furosemide 20 MG tablet Commonly known as: LASIX Take 1 tablet (20 mg total) by mouth daily as needed for fluid or edema.   Incruse Ellipta 62.5 MCG/ACT Aepb Generic drug: umeclidinium bromide Inhale 1 puff into the lungs daily.   levothyroxine 100 MCG tablet Commonly known as: SYNTHROID Take 100 mcg by mouth daily.   losartan 50 MG tablet Commonly known as: COZAAR Take 50 mg by mouth daily.   meloxicam 15 MG tablet Commonly known as: MOBIC Take 15 mg by mouth daily.   methocarbamol 500 MG tablet Commonly known as: ROBAXIN Take 1 tablet (500 mg total) by mouth every 8 (eight) hours as needed for muscle spasms.   montelukast 10 MG tablet Commonly known as: SINGULAIR Take 1 tablet (10 mg total) by mouth at bedtime.   omeprazole 40 MG capsule Commonly known as: PRILOSEC Take 40 mg by mouth daily.   polyethylene glycol 17 g packet Commonly known as: MIRALAX / GLYCOLAX Take 17 g by mouth daily as needed for mild constipation.   predniSONE 5 MG tablet Commonly known as: DELTASONE Take 5 mg by mouth daily.   rosuvastatin 10 MG tablet Commonly known as: CRESTOR Take 10 mg by mouth at bedtime.   senna 8.6 MG Tabs tablet Commonly known as: SENOKOT Take 1 tablet (8.6 mg total) by mouth 2 (two) times daily. Hold for diarrhea.   sertraline 50 MG tablet Commonly known as: ZOLOFT Take 50 mg by mouth daily.        Discharge Exam: Filed  Weights   11/26/22 1652  Weight: 60 kg   HEENT:  [place]/AT, No thrush, no icterus CV:  RRR, no rub, no S3, no S4 Lung:  basilar rales.  No wheeze.  Good air movement Abd:  soft/+BS, NT Ext:  No edema, no lymphangitis, no synovitis, no rash   Condition at discharge: stable  The results of significant diagnostics from this hospitalization (including imaging, microbiology, ancillary and laboratory) are listed below for reference.   Imaging Studies: CT Angio Chest PE W/Cm &/Or Wo Cm  Result Date: 11/26/2022 CLINICAL DATA:  89 year old female with shortness of breath since 0430 hours. Congestion. EXAM: CT ANGIOGRAPHY CHEST WITH CONTRAST TECHNIQUE: Multidetector CT imaging of the chest was performed using the standard protocol during bolus administration of intravenous contrast. Multiplanar CT image reconstructions and MIPs were obtained to evaluate the vascular anatomy. RADIATION DOSE REDUCTION: This exam was performed according to the departmental dose-optimization program which includes automated exposure control, adjustment of the mA and/or kV according to patient size and/or use of iterative reconstruction technique. CONTRAST:  75mL OMNIPAQUE IOHEXOL 350 MG/ML SOLN COMPARISON:  Chest CTA 03/02/2021.  Portable chest 0706 hours today. FINDINGS: Cardiovascular: Good contrast bolus timing in the pulmonary arterial tree.  No pulmonary artery filling defect. Chronic Calcified aortic atherosclerosis, calcified coronary artery atherosclerosis. Stable heart size since 2022, borderline to mild enlarged. No pericardial effusion. Mediastinum/Nodes: Large chronic stomach, mesentery, and proximal duodenum containing hiatal hernia. This is larger since 2022 and now also contains a segment of the transverse colon and transverse mesocolon. No superimposed mediastinal mass or lymphadenopathy. Lungs/Pleura: Lower lung volumes compared to 2022. New small layering right pleural effusion. Partially enhancing confluent medial  basal segment, posterior basal segment right lower lobe opacity. In part this resembles compressive atelectasis. Major airways remain patent. More typical appearing middle lobe and left lower lobe atelectasis. Upper Abdomen: Negative visible liver, spleen, pancreas, adrenal glands and kidneys. Aside from large hiatal hernia and diverticulosis the visible upper bowel is negative. No free air or free fluid in the visible upper abdomen. Musculoskeletal: Chronic T8 and L1 compression fractures do not appear significantly changed from 2022. Chronic manubrium fracture also not significantly changed. Underlying osteopenia. Lateral right 5th rib fracture is mildly displaced and new since 2022 although appears chronic and partially healed. No acute osseous abnormality identified. Review of the MIP images confirms the above findings. IMPRESSION: 1. Negative for acute pulmonary embolus. 2. Small new layering right pleural effusion and right lower lobe opacity indeterminate for atelectasis (see #3) versus mild Pneumonia. 3. Superimposed very large hiatal hernia has increased since 2022, and now contains a segment of the transverse colon and related mesentery, in addition to the stomach and proximal duodenum. 4. Chronic T8 and L1 compression fractures. Chronic manubrium and right 5th rib fractures. 5.  Aortic Atherosclerosis (ICD10-I70.0). Electronically Signed   By: Odessa Fleming M.D.   On: 11/26/2022 09:52   DG Chest Port 1 View  Result Date: 11/26/2022 CLINICAL DATA:  89 year old female with shortness of breath. Cough and congestion. EXAM: PORTABLE CHEST 1 VIEW COMPARISON:  Chest radiographs 08/26/2021 and earlier. FINDINGS: Portable AP upright view at 0706 hours. Large chronic gastric hiatal hernia. Stable cardiac size and mediastinal contours. Calcified aortic atherosclerosis. Lower lung volumes compared to last year. No pneumothorax or pulmonary edema. Hiatal hernia associated lung base atelectasis. No convincing pleural  effusion or consolidation. Chronic appearing right lateral 4th rib fracture. No acute osseous abnormality identified. Negative visible bowel gas. IMPRESSION: Lower lung volumes. Large chronic gastric hiatal hernia, with associated atelectasis. No other acute cardiopulmonary abnormality. Electronically Signed   By: Odessa Fleming M.D.   On: 11/26/2022 07:23    Microbiology: Results for orders placed or performed during the hospital encounter of 11/26/22  SARS Coronavirus 2 by RT PCR (hospital order, performed in [facility] hospital lab) *cepheid single result test* Anterior Nasal Swab     Status: None   Collection Time: 11/26/22  7:11 AM   Specimen: Anterior Nasal Swab  Result Value Ref Range Status   SARS Coronavirus 2 by RT PCR NEGATIVE NEGATIVE Final    Comment: (NOTE) SARS-CoV-2 target nucleic acids are NOT DETECTED.  The SARS-CoV-2 RNA is generally detectable in upper and lower respiratory specimens during the acute phase of infection. The lowest concentration of SARS-CoV-2 viral copies this assay can detect is 250 copies / mL. A negative result does not preclude SARS-CoV-2 infection and should not be used as the sole basis for treatment or other patient management decisions.  A negative result may occur with improper specimen collection / handling, submission of specimen other than nasopharyngeal swab, presence of viral mutation(s) within the areas targeted by this assay, and inadequate number of viral copies (<250 copies /  mL). A negative result must be combined with clinical observations, patient history, and epidemiological information.  Fact Sheet for Patients:   RoadLapTop.co.za  Fact Sheet for Healthcare Providers: http://kim-miller.com/  This test is not yet approved or  cleared by the [place] FDA and has been authorized for detection and/or diagnosis of SARS-CoV-2 by FDA under an Emergency Use Authorization (EUA).  This EUA will  remain in effect (meaning this test can be used) for the duration of the COVID-19 declaration under Section 564(b)(1) of the Act, 21 U.S.C. section 360bbb-3(b)(1), unless the authorization is terminated or revoked sooner.  Performed at [facility], [address] [place], [place] [address]   Culture, blood (routine x 2)     Status: Abnormal   Collection Time: 11/26/22  7:17 AM   Specimen: BLOOD LEFT HAND  Result Value Ref Range Status   Specimen Description   Final    BLOOD LEFT HAND BOTTLES DRAWN AEROBIC AND ANAEROBIC Performed at [facility], [address], [place], [place] [address]    Special Requests   Final    Blood Culture results may not be optimal due to an excessive volume of blood received in culture bottles Performed at [facility], [address], [place], [place] [address]    Culture  Setup Time   Final    GRAM NEGATIVE RODS Gram Stain Report Called to,Read Back By and Verified With: B. ROBERTSON @ 1331 BY STEPHTR 11/27/22 AEROBIC BOTTLE ONLY  REVIEWED BY A. LAFRANCE CRITICAL RESULT CALLED TO, READ BACK BY AND VERIFIED WITH:  Maebelle Munroe, RN 11/27/22 1917 A. LAFRANCE Performed at [facility], [address] [place], [place] [address]    Culture ESCHERICHIA COLI (A)  Final   Report Status 11/29/2022 FINAL  Final   Organism ID, Bacteria ESCHERICHIA COLI  Final      Susceptibility   Escherichia coli - MIC*    AMPICILLIN >=32 RESISTANT Resistant     CEFEPIME <=0.12 SENSITIVE Sensitive     CEFTAZIDIME <=1 SENSITIVE Sensitive     CEFTRIAXONE <=0.25 SENSITIVE Sensitive     CIPROFLOXACIN <=0.25 SENSITIVE Sensitive     GENTAMICIN <=1 SENSITIVE Sensitive     IMIPENEM <=0.25 SENSITIVE Sensitive     TRIMETH/SULFA >=320 RESISTANT Resistant     AMPICILLIN/SULBACTAM 8 SENSITIVE Sensitive     PIP/TAZO <=4 SENSITIVE Sensitive     * ESCHERICHIA COLI  Blood Culture ID Panel (Reflexed)     Status: Abnormal   Collection Time: 11/26/22  7:17 AM  Result Value Ref  Range Status   Enterococcus faecalis NOT DETECTED NOT DETECTED Final   Enterococcus Faecium NOT DETECTED NOT DETECTED Final   Listeria monocytogenes NOT DETECTED NOT DETECTED Final   Staphylococcus species NOT DETECTED NOT DETECTED Final   Staphylococcus aureus (BCID) NOT DETECTED NOT DETECTED Final   Staphylococcus epidermidis NOT DETECTED NOT DETECTED Final   Staphylococcus lugdunensis NOT DETECTED NOT DETECTED Final   Streptococcus species NOT DETECTED NOT DETECTED Final   Streptococcus agalactiae NOT DETECTED NOT DETECTED Final   Streptococcus pneumoniae NOT DETECTED NOT DETECTED Final   Streptococcus pyogenes NOT DETECTED NOT DETECTED Final   A.calcoaceticus-baumannii NOT DETECTED NOT DETECTED Final   Bacteroides fragilis NOT DETECTED NOT DETECTED Final   Enterobacterales DETECTED (A) NOT DETECTED Final    Comment: Enterobacterales represent a large order of gram negative bacteria, not a single organism. CRITICAL RESULT CALLED TO, READ BACK BY AND VERIFIED WITH:  Maebelle Munroe, RN 11/27/22 1917 A.  LAFRANCE    Enterobacter cloacae complex NOT DETECTED NOT DETECTED Final   Escherichia coli DETECTED (A) NOT DETECTED Final    Comment: CRITICAL RESULT CALLED TO, READ BACK BY AND VERIFIED WITH:  Maebelle Munroe, RN 11/27/22 1917 A. LAFRANCE    Klebsiella aerogenes NOT DETECTED NOT DETECTED Final   Klebsiella oxytoca NOT DETECTED NOT DETECTED Final   Klebsiella pneumoniae NOT DETECTED NOT DETECTED Final   Proteus species NOT DETECTED NOT DETECTED Final   Salmonella species NOT DETECTED NOT DETECTED Final   Serratia marcescens NOT DETECTED NOT DETECTED Final   Haemophilus influenzae NOT DETECTED NOT DETECTED Final   Neisseria meningitidis NOT DETECTED NOT DETECTED Final   Pseudomonas aeruginosa NOT DETECTED NOT DETECTED Final   Stenotrophomonas maltophilia NOT DETECTED NOT DETECTED Final   Candida albicans NOT DETECTED NOT DETECTED Final   Candida auris NOT DETECTED NOT DETECTED  Final   Candida glabrata NOT DETECTED NOT DETECTED Final   Candida krusei NOT DETECTED NOT DETECTED Final   Candida parapsilosis NOT DETECTED NOT DETECTED Final   Candida tropicalis NOT DETECTED NOT DETECTED Final   Cryptococcus neoformans/gattii NOT DETECTED NOT DETECTED Final   CTX-M ESBL NOT DETECTED NOT DETECTED Final   Carbapenem resistance IMP NOT DETECTED NOT DETECTED Final   Carbapenem resistance KPC NOT DETECTED NOT DETECTED Final   Carbapenem resistance NDM NOT DETECTED NOT DETECTED Final   Carbapenem resist OXA 48 LIKE NOT DETECTED NOT DETECTED Final   Carbapenem resistance VIM NOT DETECTED NOT DETECTED Final    Comment: Performed at [facility], [address] [place], [place] [address]  Culture, blood (routine x 2)     Status: Abnormal   Collection Time: 11/26/22  9:10 AM   Specimen: BLOOD RIGHT HAND  Result Value Ref Range Status   Specimen Description   Final    BLOOD RIGHT HAND AEROBIC BOTTLE ONLY Performed at [facility] [address], [place], [place] [address]    Special Requests   Final    Blood Culture adequate volume Performed at [facility], [address] [place], [place] [address]    Culture  Setup Time   Final    GRAM NEGATIVE RODS Gram Stain Report Called to,Read Back By and Verified With: BILAL, F AT 2015 ON 11/27/22 BY SMN. AEROBIC BOTTLE ONLY CRITICAL VALUE NOTED.  VALUE IS CONSISTENT WITH PREVIOUSLY REPORTED AND CALLED VALUE.    Culture (A)  Final    ESCHERICHIA COLI SUSCEPTIBILITIES PERFORMED ON PREVIOUS CULTURE WITHIN THE LAST 5 DAYS. Performed at [facility], [address], [place], [place] [address]    Report Status 11/29/2022 FINAL  Final  Urine Culture (for pregnant, neutropenic or urologic patients or patients with an indwelling urinary catheter)     Status: Abnormal   Collection Time: 11/26/22  9:46 AM   Specimen: Urine, Clean Catch  Result Value Ref Range Status   Specimen Description   Final    URINE, CLEAN  CATCH Performed at [facility], [address] [place], [place] [address]    Special Requests   Final    NONE Performed at [facility], [address], [place], [place] [address]    Culture >=100,000 COLONIES/mL ESCHERICHIA COLI (A)  Final   Report Status 11/29/2022 FINAL  Final   Organism ID, Bacteria ESCHERICHIA COLI (A)  Final      Susceptibility   Escherichia coli - MIC*    AMPICILLIN >=32 RESISTANT Resistant     CEFAZOLIN <=4  SENSITIVE Sensitive     CEFEPIME <=0.12 SENSITIVE Sensitive     CEFTRIAXONE <=0.25 SENSITIVE Sensitive     CIPROFLOXACIN <=0.25 SENSITIVE Sensitive     GENTAMICIN <=1 SENSITIVE Sensitive     IMIPENEM <=0.25 SENSITIVE Sensitive     NITROFURANTOIN <=16 SENSITIVE Sensitive     TRIMETH/SULFA >=320 RESISTANT Resistant     AMPICILLIN/SULBACTAM 4 SENSITIVE Sensitive     PIP/TAZO <=4 SENSITIVE Sensitive     * >=100,000 COLONIES/mL ESCHERICHIA COLI  MRSA Next Gen by PCR, Nasal     Status: None   Collection Time: 11/26/22  3:25 PM   Specimen: Nasal Mucosa; Nasal Swab  Result Value Ref Range Status   MRSA by PCR Next Gen NOT DETECTED NOT DETECTED Final    Comment: (NOTE) The GeneXpert MRSA Assay (FDA approved for NASAL specimens only), is one component of a comprehensive MRSA colonization surveillance program. It is not intended to diagnose MRSA infection nor to guide or monitor treatment for MRSA infections. Test performance is not FDA approved in patients less than 30 years old. Performed at [facility], [address] [place], [place] [address]   Respiratory (~20 pathogens) panel by PCR     Status: Abnormal   Collection Time: 11/26/22  3:25 PM   Specimen: Nasopharyngeal Swab; Respiratory  Result Value Ref Range Status   Adenovirus NOT DETECTED NOT DETECTED Final   Coronavirus 229E NOT DETECTED NOT DETECTED Final    Comment: (NOTE) The Coronavirus on the Respiratory Panel, DOES NOT test for the novel  Coronavirus (2019 nCoV)    Coronavirus HKU1 NOT  DETECTED NOT DETECTED Final   Coronavirus NL63 NOT DETECTED NOT DETECTED Final   Coronavirus OC43 NOT DETECTED NOT DETECTED Final   Metapneumovirus NOT DETECTED NOT DETECTED Final   Rhinovirus / Enterovirus DETECTED (A) NOT DETECTED Final   Influenza A NOT DETECTED NOT DETECTED Final   Influenza B NOT DETECTED NOT DETECTED Final   Parainfluenza Virus 1 NOT DETECTED NOT DETECTED Final   Parainfluenza Virus 2 NOT DETECTED NOT DETECTED Final   Parainfluenza Virus 3 NOT DETECTED NOT DETECTED Final   Parainfluenza Virus 4 NOT DETECTED NOT DETECTED Final   Respiratory Syncytial Virus NOT DETECTED NOT DETECTED Final   Bordetella pertussis NOT DETECTED NOT DETECTED Final   Bordetella Parapertussis NOT DETECTED NOT DETECTED Final   Chlamydophila pneumoniae NOT DETECTED NOT DETECTED Final   Mycoplasma pneumoniae NOT DETECTED NOT DETECTED Final    Comment: Performed at [facility], [address], [place], [place] [address]    Labs: CBC: Recent Labs  Lab 11/26/22 [phone] 11/27/22 0636 11/28/22 0434  WBC 11.4* 8.6 8.0  NEUTROABS 9.4*  --   --   HGB 10.4* 10.6* 9.7*  HCT 33.6* 34.0* 30.7*  MCV 98.2 99.1 99.0  PLT 252 264 276   Basic Metabolic Panel: Recent Labs  Lab 11/26/22 0717 11/27/22 0636 11/28/22 0434  NA 131* 136 134*  K 4.0 4.3 4.1  CL 100 102 103  CO2 22 25 24   GLUCOSE 106* 89 83  BUN 16 12 12   CREATININE 0.90 0.72 0.81  CALCIUM 9.1 9.6 9.3  MG  --   --  2.0   Liver Function Tests: No results for input(s): "AST", "ALT", "ALKPHOS", "BILITOT", "PROT", "ALBUMIN" in the last 168 hours. CBG: No results for input(s): "GLUCAP" in the last 168 hours.  Discharge time spent: greater than 30 minutes.  Signed: Catarina Hartshorn, MD [facility] 11/29/2022

## 2022-11-29 NOTE — TOC Transition Note (Signed)
Transition of Care [facility]) - CM/SW Discharge Note   Patient Details  Name: Lindsey Butler MRN: [id number] Date of Birth: 06-25-1929  Transition of Care [facility]) CM/SW Contact:  Karn Cassis, LCSW Phone Number: 11/29/2022, 2:13 PM   Clinical Narrative: Pt d/c today back to [facility]. Daughter-in-law, Adella Nissen and Chasity at [employer] aware and agreeable. Kristal to transport. D/C summary and FL2 sent to ALF. Adella Nissen concerned about rhinovirus but per MD no longer needs contact or droplet precautions. Kristal updated. RN aware Adella Nissen will pick up pt this evening.      Final next level of care: Assisted Living Barriers to Discharge: Barriers Resolved   Patient Goals and CMS Choice   Choice offered to / list presented to : Adult Children  Discharge Placement                  Patient to be transferred to facility by: family Name of family member notified: daughter-in-law Patient and family notified of of transfer: 11/29/22  Discharge Plan and Services Additional resources added to the After Visit Summary for                                       Social Determinants of Health (SDOH) Interventions SDOH Screenings   Food Insecurity: No Food Insecurity (11/26/2022)  Housing: Low Risk  (11/26/2022)  Transportation Needs: No Transportation Needs (11/26/2022)  Utilities: Not At Risk (11/26/2022)  Tobacco Use: Low Risk  (11/26/2022)     Readmission Risk Interventions     No data to display

## 2022-11-29 NOTE — Consult Note (Signed)
   [facility] Inpatient Consult   11/29/2022  Lindsey Butler 05/23/1929 [id number]     Location: [facility] RN Hospital Liaison screened remotely [facility]).   [facility] Customer service manager [facility]) Accountable Care Organization [ACO] Patient: Insurance [facility])    Primary Care Provider:  Toma Deiters, MD [facility]   Patient screened for readmission hospitalization with noted low risk score for unplanned readmission risk with 2 IP in 6 months. [facility]/Population Health RN liaison will assess for potential [facility]) Care Management service needs for post hospital transition for care coordination. Pt will return to her ALF ([facility]).    [facility] Care Management/Population Health does not replace or interfere with any arrangements made by the Inpatient Transition of Care team.   For questions contact:      Elliot Cousin, RN, BSN [facility] Liaison [place]   [facility]  Population Health Office Hours MTWF 8:00 am to 6 pm off on Thursday [phone] mobile [phone] [Office toll free line][facility] Office Hours are M-F 8:30 - 5 pm 24 hour nurse advise line [phone] Conceirge  [email or web address]

## 2022-11-29 NOTE — [place] FL2 (Addendum)
[place] MEDICAID FL2 LEVEL OF CARE FORM     IDENTIFICATION  Patient Name: Lindsey Butler Birthdate: 1928-11-23 Sex: female Admission Date (Current Location): 11/26/2022  [facility] and [place] Number:  [employer] and Address:  [facility],  [address], [place] [address]      Provider Number: [id number]  Attending Physician Name and Address:  Catarina Hartshorn, MD  Relative Name and Phone Number:       Current Level of Care: Hospital Recommended Level of Care: Assisted Living Facility Prior Approval Number:    Date Approved/Denied:   PASRR Number:    Discharge Plan: Other (Comment) (ALF)    Current Diagnoses: Patient Active Problem List   Diagnosis Date Noted   Gram-negative bacteremia 11/27/2022   Lobar pneumonia 11/26/2022   Chronic heart failure with preserved ejection fraction (HFpEF) 09/13/2022   Benign hypertension with coincident congestive heart failure 09/13/2022   Aortic atherosclerosis 09/13/2022   Hyperlipidemia LDL goal <130 09/13/2022   Pelvic fracture 09/08/2022   Post-operative hypothyroidism 10/13/2019   Essential hypertension 10/13/2019   Major depression, recurrent, chronic 10/13/2019   Hiatal hernia with GERD without esophagitis 10/13/2019   Increased intraocular pressure, bilateral 10/13/2019   Lumbar spondylosis 05/01/2018   Spinal stenosis of lumbar region without neurogenic claudication 09/27/2016   Basal cell carcinoma of skin of other parts of face 08/29/2016   Neuralgia, neuritis or radiculitis 03/27/2014   Osteoporosis 10/28/2013   Hypothyroidism 07/17/2013   Asthma 09/14/2012   Diverticulosis of colon 05/05/2011    Orientation RESPIRATION BLADDER Height & Weight       x4 Normal Continent Weight: 132 lb 4.4 oz (60 kg) Height:   (170.2 cm)  BEHAVIORAL SYMPTOMS/MOOD NEUROLOGICAL BOWEL NUTRITION STATUS      Continent Cardiac diet  AMBULATORY STATUS COMMUNICATION OF NEEDS Skin   Limited Assist Verbally  Bruising                       Personal Care Assistance Level of Assistance  Bathing, Feeding, Dressing Bathing Assistance: Limited assistance Feeding assistance: Limited assistance Dressing Assistance: Limited assistance     Functional Limitations Info  Sight, Hearing, Speech Sight Info: Impaired Hearing Info: Adequate Speech Info: Adequate    SPECIAL CARE FACTORS FREQUENCY                       Contractures      Additional Factors Info  Code Status, Psychotropic Code Status Info: DNR   Psychotropic Info: Zoloft        Current Medications (11/29/2022):  This is the current hospital active medication list Current Facility-Administered Medications  Medication Dose Route Frequency Provider Last Rate Last Admin   acetaminophen (TYLENOL) tablet 650 mg  650 mg Oral Q6H PRN Tat, Onalee Hua, MD   650 mg at 11/29/22 0441   Or   acetaminophen (TYLENOL) suppository 650 mg  650 mg Rectal Q6H PRN Tat, Onalee Hua, MD       arformoterol (BROVANA) nebulizer solution 15 mcg  15 mcg Nebulization BID Catarina Hartshorn, MD   15 mcg at 11/29/22 0705   azithromycin (ZITHROMAX) tablet 500 mg  500 mg Oral Daily Tat, David, MD   500 mg at 11/29/22 0912   budesonide (PULMICORT) nebulizer solution 0.5 mg  0.5 mg Nebulization BID Tat, Onalee Hua, MD   0.5 mg at 11/29/22 3557   cefdinir (OMNICEF) capsule 300 mg  300 mg Oral Pablo Ledger, MD  cholecalciferol (VITAMIN D3) 25 MCG (1000 UNIT) tablet 1,000 Units  1,000 Units Oral Daily Tat, David, MD   1,000 Units at 11/29/22 0912   cyanocobalamin (VITAMIN B12) tablet 1,000 mcg  1,000 mcg Oral Daily Tat, David, MD   1,000 mcg at 11/29/22 0912   enoxaparin (LOVENOX) injection 40 mg  40 mg Subcutaneous Q24H Tat, Onalee Hua, MD   40 mg at 11/28/22 1358   ipratropium-albuterol (DUONEB) 0.5-2.5 (3) MG/3ML nebulizer solution 3 mL  3 mL Nebulization TID Catarina Hartshorn, MD   3 mL at 11/29/22 1610   levothyroxine (SYNTHROID) tablet 100 mcg  100 mcg Oral Daily Tat, Onalee Hua, MD    100 mcg at 11/29/22 0441   losartan (COZAAR) tablet 50 mg  50 mg Oral Daily Tat, David, MD   50 mg at 11/29/22 0912   methocarbamol (ROBAXIN) tablet 500 mg  500 mg Oral Q8H PRN Tat, David, MD       montelukast (SINGULAIR) tablet 10 mg  10 mg Oral QHS Catarina Hartshorn, MD   10 mg at 11/28/22 2214   ondansetron (ZOFRAN) tablet 4 mg  4 mg Oral Q6H PRN Tat, Onalee Hua, MD       Or   ondansetron (ZOFRAN) injection 4 mg  4 mg Intravenous Q6H PRN Tat, Onalee Hua, MD       pantoprazole (PROTONIX) EC tablet 40 mg  40 mg Oral Daily Tat, David, MD   40 mg at 11/29/22 0912   phenol (CHLORASEPTIC) mouth spray 1 spray  1 spray Mouth/Throat PRN Tat, Onalee Hua, MD   1 spray at 11/26/22 1745   polyethylene glycol (MIRALAX / GLYCOLAX) packet 17 g  17 g Oral Daily PRN Tat, Onalee Hua, MD       predniSONE (DELTASONE) tablet 5 mg  5 mg Oral Daily Tat, David, MD   5 mg at 11/29/22 0912   rosuvastatin (CRESTOR) tablet 10 mg  10 mg Oral Maura Crandall, MD   10 mg at 11/28/22 2214   senna (SENOKOT) tablet 8.6 mg  1 tablet Oral BID Tat, Onalee Hua, MD   8.6 mg at 11/29/22 0912   sertraline (ZOLOFT) tablet 50 mg  50 mg Oral Daily Tat, David, MD   50 mg at 11/29/22 9604     Discharge Medications: TAKE these medications     acetaminophen 500 MG tablet Commonly known as: TYLENOL Take 2 tablets (1,000 mg total) by mouth every 8 (eight) hours.    budesonide 0.5 MG/2ML nebulizer solution Commonly known as: PULMICORT Take 2 mLs (0.5 mg total) by nebulization 2 (two) times daily.    calcium carbonate 1500 (600 Ca) MG Tabs tablet Commonly known as: OSCAL Take 1,500 mg by mouth daily with breakfast.    cefdinir 300 MG capsule Commonly known as: OMNICEF Take 1 capsule (300 mg total) by mouth every 12 (twelve) hours.    cholecalciferol 25 MCG (1000 UNIT) tablet Commonly known as: VITAMIN D3 Take 1,000 Units by mouth daily.    cyanocobalamin 1000 MCG tablet Commonly known as: VITAMIN B12 Take 1,000 mcg by mouth daily.    furosemide 20 MG  tablet Commonly known as: LASIX Take 1 tablet (20 mg total) by mouth daily as needed for fluid or edema.    Incruse Ellipta 62.5 MCG/ACT Aepb Generic drug: umeclidinium bromide Inhale 1 puff into the lungs daily.    levothyroxine 100 MCG tablet Commonly known as: SYNTHROID Take 100 mcg by mouth daily.    losartan 50 MG tablet Commonly known as: COZAAR Take 50  mg by mouth daily.    meloxicam 15 MG tablet Commonly known as: MOBIC Take 15 mg by mouth daily.    methocarbamol 500 MG tablet Commonly known as: ROBAXIN Take 1 tablet (500 mg total) by mouth every 8 (eight) hours as needed for muscle spasms.    montelukast 10 MG tablet Commonly known as: SINGULAIR Take 1 tablet (10 mg total) by mouth at bedtime.    omeprazole 40 MG capsule Commonly known as: PRILOSEC Take 40 mg by mouth daily.    polyethylene glycol 17 g packet Commonly known as: MIRALAX / GLYCOLAX Take 17 g by mouth daily as needed for mild constipation.    predniSONE 5 MG tablet Commonly known as: DELTASONE Take 5 mg by mouth daily.    rosuvastatin 10 MG tablet Commonly known as: CRESTOR Take 10 mg by mouth at bedtime.    senna 8.6 MG Tabs tablet Commonly known as: SENOKOT Take 1 tablet (8.6 mg total) by mouth 2 (two) times daily. Hold for diarrhea.    sertraline 50 MG tablet Commonly known as: ZOLOFT Take 50 mg by mouth daily.      Relevant Imaging Results:  Relevant Lab Results:   Additional Information    Karn Cassis, LCSW

## 2022-12-02 ENCOUNTER — Encounter (HOSPITAL_COMMUNITY): Payer: Self-pay

## 2022-12-02 ENCOUNTER — Emergency Department (HOSPITAL_COMMUNITY): Payer: Medicare Other

## 2022-12-02 ENCOUNTER — Emergency Department (HOSPITAL_COMMUNITY)
Admission: EM | Admit: 2022-12-02 | Discharge: 2022-12-02 | Disposition: A | Discharge status: Skilled Nursing Facility | Payer: Medicare Other | Source: Home / Self Care | Attending: Emergency Medicine | Admitting: Emergency Medicine

## 2022-12-02 ENCOUNTER — Emergency Department (HOSPITAL_COMMUNITY)
Admission: EM | Admit: 2022-12-02 | Discharge: 2022-12-02 | Disposition: A | Discharge status: Skilled Nursing Facility | Payer: Medicare Other | Attending: Emergency Medicine | Admitting: Emergency Medicine

## 2022-12-02 ENCOUNTER — Other Ambulatory Visit: Payer: Self-pay

## 2022-12-02 DIAGNOSIS — Z7401 Bed confinement status: Secondary | ICD-10-CM | POA: Diagnosis not present

## 2022-12-02 DIAGNOSIS — F0390 Unspecified dementia without behavioral disturbance: Secondary | ICD-10-CM | POA: Insufficient documentation

## 2022-12-02 DIAGNOSIS — R0602 Shortness of breath: Secondary | ICD-10-CM | POA: Insufficient documentation

## 2022-12-02 DIAGNOSIS — J45909 Unspecified asthma, uncomplicated: Secondary | ICD-10-CM | POA: Insufficient documentation

## 2022-12-02 DIAGNOSIS — J9811 Atelectasis: Secondary | ICD-10-CM | POA: Insufficient documentation

## 2022-12-02 DIAGNOSIS — J449 Chronic obstructive pulmonary disease, unspecified: Secondary | ICD-10-CM | POA: Insufficient documentation

## 2022-12-02 DIAGNOSIS — R6889 Other general symptoms and signs: Secondary | ICD-10-CM | POA: Diagnosis not present

## 2022-12-02 DIAGNOSIS — I5030 Unspecified diastolic (congestive) heart failure: Secondary | ICD-10-CM | POA: Diagnosis not present

## 2022-12-02 DIAGNOSIS — Z79899 Other long term (current) drug therapy: Secondary | ICD-10-CM | POA: Insufficient documentation

## 2022-12-02 DIAGNOSIS — J4489 Other specified chronic obstructive pulmonary disease: Secondary | ICD-10-CM | POA: Insufficient documentation

## 2022-12-02 DIAGNOSIS — I110 Hypertensive heart disease with heart failure: Secondary | ICD-10-CM | POA: Insufficient documentation

## 2022-12-02 DIAGNOSIS — I509 Heart failure, unspecified: Secondary | ICD-10-CM | POA: Insufficient documentation

## 2022-12-02 DIAGNOSIS — Z0189 Encounter for other specified special examinations: Secondary | ICD-10-CM | POA: Diagnosis present

## 2022-12-02 DIAGNOSIS — R059 Cough, unspecified: Secondary | ICD-10-CM | POA: Diagnosis not present

## 2022-12-02 DIAGNOSIS — R062 Wheezing: Secondary | ICD-10-CM | POA: Diagnosis not present

## 2022-12-02 DIAGNOSIS — E039 Hypothyroidism, unspecified: Secondary | ICD-10-CM | POA: Insufficient documentation

## 2022-12-02 DIAGNOSIS — K449 Diaphragmatic hernia without obstruction or gangrene: Secondary | ICD-10-CM | POA: Diagnosis not present

## 2022-12-02 DIAGNOSIS — Z743 Need for continuous supervision: Secondary | ICD-10-CM | POA: Diagnosis not present

## 2022-12-02 LAB — CBC
HCT: 35 % — ABNORMAL LOW (ref 36.0–46.0)
HCT: 36.6 % (ref 36.0–46.0)
Hemoglobin: 10.9 g/dL — ABNORMAL LOW (ref 12.0–15.0)
Hemoglobin: 12 g/dL (ref 12.0–15.0)
MCH: 30.7 pg (ref 26.0–34.0)
MCH: 31.3 pg (ref 26.0–34.0)
MCHC: 31.1 g/dL (ref 30.0–36.0)
MCHC: 32.8 g/dL (ref 30.0–36.0)
MCV: 98.6 fL (ref 80.0–100.0)
MCV: 95.3 fL (ref 80.0–100.0)
Platelets: 379 10*3/uL (ref 150–400)
Platelets: 483 10*3/uL — ABNORMAL HIGH (ref 150–400)
RBC: 3.55 MIL/uL — ABNORMAL LOW (ref 3.87–5.11)
RBC: 3.84 MIL/uL — ABNORMAL LOW (ref 3.87–5.11)
RDW: 15.3 % (ref 11.5–15.5)
RDW: 15.1 % (ref 11.5–15.5)
WBC: 10.2 10*3/uL (ref 4.0–10.5)
WBC: 10.9 10*3/uL — ABNORMAL HIGH (ref 4.0–10.5)
nRBC: 0 % (ref 0.0–0.2)
nRBC: 0 % (ref 0.0–0.2)

## 2022-12-02 LAB — BASIC METABOLIC PANEL
Anion gap: 9 (ref 5–15)
Anion gap: 9 (ref 5–15)
BUN: 18 mg/dL (ref 8–23)
BUN: 17 mg/dL (ref 8–23)
CO2: 24 mmol/L (ref 22–32)
CO2: 23 mmol/L (ref 22–32)
Calcium: 10 mg/dL (ref 8.9–10.3)
Calcium: 10.6 mg/dL — ABNORMAL HIGH (ref 8.9–10.3)
Chloride: 99 mmol/L (ref 98–111)
Chloride: 99 mmol/L (ref 98–111)
Creatinine, Ser: 0.86 mg/dL (ref 0.44–1.00)
Creatinine, Ser: 0.9 mg/dL (ref 0.44–1.00)
GFR, Estimated: >60 mL/min (ref >60)
GFR, Estimated: 60 mL/min — ABNORMAL LOW (ref >60)
Glucose, Bld: 94 mg/dL (ref 70–99)
Glucose, Bld: 111 mg/dL — ABNORMAL HIGH (ref 70–99)
Potassium: 4.3 mmol/L (ref 3.5–5.1)
Potassium: 4.7 mmol/L (ref 3.5–5.1)
Sodium: 132 mmol/L — ABNORMAL LOW (ref 135–145)
Sodium: 131 mmol/L — ABNORMAL LOW (ref 135–145)

## 2022-12-02 LAB — TROPONIN I (HIGH SENSITIVITY): Troponin I (High Sensitivity): 8 ng/L (ref <18)

## 2022-12-02 MED ORDER — CEFADROXIL 500 MG PO CAPS: 500.0000 mg | ORAL_CAPSULE | Freq: Two times a day (BID) | ORAL | 0 refills | Status: AC

## 2022-12-02 MED ORDER — LACTATED RINGERS IV BOLUS
500.0000 mL | Freq: Once | INTRAVENOUS | Status: AC
  Administered 2022-12-02: 500 mL via INTRAVENOUS

## 2022-12-02 MED ORDER — CEFADROXIL 500 MG PO CAPS
500.0000 mg | ORAL_CAPSULE | Freq: Once | ORAL | Status: AC
  Administered 2022-12-02: 500 mg via ORAL
  Filled 2022-12-02: qty 1

## 2022-12-02 NOTE — ED Provider Notes (Signed)
[place] EMERGENCY DEPARTMENT AT [facility] Provider Note   CSN: [id number] Arrival date & time: 12/02/22  1025     History  Chief Complaint  Patient presents with   Sent from SNF    Lindsey Butler is a 89 y.o. female.  HPI   This patient is a 89 year old female coming from a nursing facility, [facility], she has reportedly recently been treated for pneumonia, review of the medical record shows that she was admitted on April 20, she was discharged on the 23rd, during that admission it was clear that she has a history of COPD, acid reflux hyperlipidemia hypertension and heart failure with a preserved ejection fraction.  She was admitted with a lobar pneumonia, was given ceftriaxone and Zithromax, she had received 4 days of azithromycin and was discharged home with 7 days of cefadroxil because of blood cultures positive for E. coli.  Lindsey Butler the patient returns today for repeat x-ray as the staff at her facility are concerned that she may have a recurrent or worsening pneumonia.  They had to give her a nebulized treatment, they called the paramedics for transport because of this however the patient is found by paramedic and a stable form with no respiratory distress no hypoxia with an oxygen of 98% on room air, she was not tachycardic and was not complaining of anything whatsoever.  The staff at the facility state that they were worried that she might aspirate so they have been withholding food and water, the patient does reportedly have some dementia and tells me that she had an egg to eat this morning  Home Medications Prior to Admission medications   Medication Sig Start Date End Date Taking? Authorizing Provider  acetaminophen (TYLENOL) 500 MG tablet Take 2 tablets (1,000 mg total) by mouth every 8 (eight) hours. 09/09/22   Vassie Loll, MD  budesonide (PULMICORT) 0.5 MG/2ML nebulizer solution Take 2 mLs (0.5 mg total) by nebulization 2  (two) times daily. 03/05/21   Shon Hale, MD  calcium carbonate (OSCAL) 1500 (600 Ca) MG TABS tablet Take 1,500 mg by mouth daily with breakfast.    [provider]  cefadroxil (DURICEF) 500 MG capsule Take 2 capsules (1,000 mg total) by mouth 2 (two) times daily for 7 days. 11/29/22 12/06/22  Catarina Hartshorn, MD  cholecalciferol (VITAMIN D) 25 MCG (1000 UNIT) tablet Take 1,000 Units by mouth daily. 11/07/20   [provider]  furosemide (LASIX) 20 MG tablet Take 1 tablet (20 mg total) by mouth daily as needed for fluid or edema. 09/09/22   Vassie Loll, MD  INCRUSE ELLIPTA 62.5 MCG/ACT AEPB Inhale 1 puff into the lungs daily. 11/23/22   [provider]  levothyroxine (SYNTHROID) 100 MCG tablet Take 100 mcg by mouth daily. 08/19/22   [provider]  losartan (COZAAR) 50 MG tablet Take 50 mg by mouth daily. 08/19/22   [provider]  meloxicam (MOBIC) 15 MG tablet Take 15 mg by mouth daily.    [provider]  methocarbamol (ROBAXIN) 500 MG tablet Take 1 tablet (500 mg total) by mouth every 8 (eight) hours as needed for muscle spasms. 09/09/22   Vassie Loll, MD  montelukast (SINGULAIR) 10 MG tablet Take 1 tablet (10 mg total) by mouth at bedtime. 11/01/19   Sharee Holster, NP  omeprazole (PRILOSEC) 40 MG capsule Take 40 mg by mouth daily.    [provider]  polyethylene glycol (MIRALAX / GLYCOLAX) 17  g packet Take 17 g by mouth daily as needed for mild constipation. 09/09/22   Vassie Loll, MD  predniSONE (DELTASONE) 5 MG tablet Take 5 mg by mouth daily. 08/19/22   [provider]  rosuvastatin (CRESTOR) 10 MG tablet Take 10 mg by mouth at bedtime. 08/19/22   [provider]  senna (SENOKOT) 8.6 MG TABS tablet Take 1 tablet (8.6 mg total) by mouth 2 (two) times daily. Hold for diarrhea. 09/09/22   Vassie Loll, MD  sertraline (ZOLOFT) 50 MG tablet Take 50 mg by mouth daily. 08/19/22   [provider]  vitamin B-12  (CYANOCOBALAMIN) 1000 MCG tablet Take 1,000 mcg by mouth daily. 02/24/21   [provider]      Allergies    Patient has no known allergies.    Review of Systems   Review of Systems  All other systems reviewed and are negative.   Physical Exam Updated Vital Signs BP 137/73   Pulse 73   Temp 97.7 F (36.5 C) (Oral)   Ht 1.702 m (5\' 7" )   Wt 60 kg   SpO2 98%   BMI 20.72 kg/m  Physical Exam Vitals and nursing note reviewed.  Constitutional:      General: She is not in acute distress.    Appearance: She is well-developed.  HENT:     Head: Normocephalic and atraumatic.     Mouth/Throat:     Pharynx: No oropharyngeal exudate.  Eyes:     General: No scleral icterus.       Right eye: No discharge.        Left eye: No discharge.     Conjunctiva/sclera: Conjunctivae normal.     Pupils: Pupils are equal, round, and reactive to light.  Neck:     Thyroid: No thyromegaly.     Vascular: No JVD.  Cardiovascular:     Rate and Rhythm: Normal rate and regular rhythm.     Heart sounds: Normal heart sounds. No murmur heard.    No friction rub. No gallop.  Pulmonary:     Effort: Pulmonary effort is normal. No respiratory distress.     Breath sounds: Normal breath sounds. No wheezing, rhonchi or rales.  Abdominal:     General: Bowel sounds are normal. There is no distension.     Palpations: Abdomen is soft. There is no mass.     Tenderness: There is no abdominal tenderness.  Musculoskeletal:        General: No tenderness. Normal range of motion.     Cervical back: Normal range of motion and neck supple.     Right lower leg: No edema.     Left lower leg: No edema.  Lymphadenopathy:     Cervical: No cervical adenopathy.  Skin:    General: Skin is warm and dry.     Findings: No erythema or rash.  Neurological:     Mental Status: She is alert.     Coordination: Coordination normal.  Psychiatric:        Behavior: Behavior normal.     ED Results / Procedures /  Treatments   Labs (all labs ordered are listed, but only abnormal results are displayed) Labs Reviewed  CBC - Abnormal; Notable for the following components:      Result Value   RBC 3.55 (*)    Hemoglobin 10.9 (*)    HCT 35.0 (*)    All other components within normal limits  BASIC METABOLIC PANEL - Abnormal; Notable for the  following components:   Sodium 132 (*)    All other components within normal limits    EKG None  Radiology DG Chest Port 1 View  Result Date: 12/02/2022 CLINICAL DATA:  cough, f/u pna EXAM: PORTABLE CHEST 1 VIEW COMPARISON:  CXR 11/26/22 FINDINGS: Redemonstrated large hiatal hernia. No pleural effusion. No pneumothorax. Unchanged hazy opacity in the right lung base. No radiographically apparent displaced rib fractures. Visualized upper abdomen is otherwise unremarkable. Partially visualized lumbar spinal fusion hardware in place. IMPRESSION: 1. Unchanged hazy opacity in the right lung base, which could represent atelectasis or infection 2. Redemonstrated large hiatal hernia. Electronically Signed   By: Lorenza Cambridge M.D.   On: 12/02/2022 10:56    Procedures Procedures    Medications Ordered in ED Medications - No data to display  ED Course/ Medical Decision Making/ A&P                             Medical Decision Making Amount and/or Complexity of Data Reviewed Labs: ordered. Radiology: ordered.   This patient appears to be in no distress with clear lung sounds, no edema, no wheezing speaking in full sentences and no complaints.  Will obtain x-ray labs to make sure thing else looks okay, the patient is agreeable to the plan.  Labs: I personally viewed and interpreted labs, no leukocytosis or other high risk findings  Imaging: I personally viewed and interpreted the x-ray of the chest which shows no pneumonia, there is a persistent small infiltrate which could just be atelectasis but given the patient's clinical presentation I doubt that this is a severe  or worsening infection and she is currently on antibiotics to treat it.  Patient is very stable for discharge, normal vital signs including blood pressure heart rate and oxygen.        Final Clinical Impression(s) / ED Diagnoses Final diagnoses:  Atelectasis      Eber Hong, MD 12/02/22 1204

## 2022-12-02 NOTE — Discharge Instructions (Addendum)
Windy Fast  Thank you for allowing [place] to take care of you today.  You came to the Emergency Department today because Berklee's facility was concerned that she was short of breath and that she did not get the antibiotics that she was prescribed during her recent hospitalization.  Her labs and chest x-ray were reassuring at the other emergency department today, however you came back because she still did not have the antibiotics.  Here she is breathing easily, her oxygen numbers appropriate, she is not working too hard to breathe, her labs continue to be reassuring.  We were concerned about some dehydration so we gave her some fluid.  We gave her a dose of antibiotics here, and we are giving you a paper prescription so that her nursing home was able to fill her prescription.  To-Do: 1. Please follow-up with your primary doctor within 2 days / as soon as possible.   We are giving you a prescription for cefadroxil. You should take this every 12 hours for the next 7 days.   Please return to the Emergency Department or call 911 if you experience have worsening of your symptoms, or do not get better, chest pain, shortness of breath, severe or significantly worsening pain, high fever, severe confusion, pass out or have any reason to think that you need emergency medical care.   We hope you feel better soon.   Curley Spice, MD Department of Emergency Medicine [facility]

## 2022-12-02 NOTE — ED Triage Notes (Signed)
Pt BIB [facility] [place]. SNF wants to find out if pt had Pneumonia according to EMS. Pt did not want to come to be seen but was encouraged by family according to EMS

## 2022-12-02 NOTE — Discharge Instructions (Signed)
The x-ray and blood work were reassuring, there does not appear to be any worsening of any pneumonia, the blood work was normal the vital signs are normal and the oxygen is normal.  Lindsey Butler may continue to eat and drink as she has in the past.  Please do not withhold any food or water and that you are visibly seeing her aspirate in which case she will need to be seen by her primary doctor and have dietary recommendations made.  There is no indication for antibiotics as long as she is finishing the medicines that she was prescribed at discharge.  She has no need for oxygen at this time, her oxygen levels are normal, continue to use her inhalers as indicated and prescribed  Thank you for allowing [place] to treat you in the emergency department today.  After reviewing your examination and potential testing that was done it appears that you are safe to go home.  I would like for you to follow-up with your doctor within the next several days, have them obtain your results and follow-up with them to review all of these tests.  If you should develop severe or worsening symptoms return to the emergency department immediately

## 2022-12-02 NOTE — ED Notes (Signed)
Pt has large hematoma on lower leg leg. Denies falling or hitting it on anything and denies any pain .

## 2022-12-02 NOTE — ED Notes (Signed)
Called at 1402 to give report for pt, left VM for return call. Will try again.

## 2022-12-02 NOTE — ED Provider Notes (Signed)
[place] EMERGENCY DEPARTMENT AT [facility] Provider Note  Medical Decision Making   HPI: Lindsey Butler is a 89 y.o. female with history perinent hypertension, HFpEF, COPD/asthma, GERD, hyperlipidemia, hypothyroidism, large hiatus hernia who presents complaining of caregiver for concern for shortness of breath. Patient arrived via POV for need by son/legal guardian, Gregary Signs.  History provided by patient, chart review, son, facility.  No interpreter required for this encounter.  Patient reports that she feels nothing is wrong, she denies any shortness of breath, chest pain, fever, chills, nausea, vomiting, diarrhea, abdominal pain.  Asked patient's son at bedside if he feels that patient is short of breath, he feels that patient is breathing at baseline, when asked why they presented particularly given the worsening earlier this morning, he reports that patient was recently discharged from a hospitalization for pneumonia on 4/23.  Reports that she was sent to [facility] by her facility earlier today for shortness of breath, reports that she was discharged from [facility], however when he went to visit her at the facility the facility stated that they "have not done anything for her" at any [facility] and recommended bringing her in for reevaluation.  I personally called the patient's facility, [facility], and spoke with Tammy who is familiar with the patient's case, per Tammy, patient was recently discharged on 4/23 after hospitalization for pneumonia, was meant to be discharged with outpatient course of antibiotics, however reports that patient did not receive outpatient prescription for antibiotics.  Reports that when the patient was evaluated this morning by the facility doctors he felt that the patient had "bad sounding lungs", therefore she was sent to [facility] ED for evaluation, however was discharged from [facility], ED without antibiotics, therefore they sent her back for  reevaluation.  ROS: As per HPI. Please see MAR for complete past medical history, surgical history, and social history.   Physical exam is pertinent for elderly, vitally stable, no respiratory distress, lungs CTAB.   The differential includes but is not limited to pneumonia, normal respiratory exam.  Additional history obtained from: Personally reviewed patient's discharge summary from 4/23 as well as ED note from earlier today External records from outside source obtained and reviewed including: ED note from earlier today notes that patient was sent in for concern for shortness of breath/silent aspiration, patient had reassuring labs, chest x-ray, per HPI of [facility] patient is on an outpatient course of 7 days of cefadroxil  ED provider interpretation of ECG: Not indicated  ED provider interpretation of radiology/imaging: Not indicated  Labs ordered were interpreted by myself as well as my attending and were incorporated into the medical decision making process for this patient.  ED provider interpretation of labs: BMP without AKI or emergent electrolyte derangement, CBC with mild nonspecific leukocytosis, no anemia or thrombocytopenia.  Troponin WNL.  Interventions: LR WNL, cefadroxil  See the EMR for full details regarding lab and imaging results.  Initial evaluation, patient is overall well-appearing, no respiratory distress, normal respiratory exam.  After review of recent discharge summary, ED note from [facility], and discussing with patient, son at bedside, as well as Tammy at the patient's facility, seems that overall there is a misunderstanding regarding the antibiotics, and a pen provider seem to be under the impression that patient was on outpatient course of antibiotics, however facility is concerned because patient never received the prescription.  Per chart review it appears that prescription was electronically sent to a pharmacy in another  city.  Family expresses concern  that patient may be mildly dehydrated, request fluid bolus, administered, given patient had normal chest x-ray earlier today, do not feel that patient will benefit from additional chest x-ray presently, labs already ordered and pending from triage evaluation, I followed these up, they do not have any acute abnormality.  First dose of antibiotics administered in the ED, and patient discharged back to facility with paper prescription for 7-day course of cefadroxil that she was initially supposed be discharged with on 4/23.  Patient and son amenable to this plan.     Consults: Not indicated  Disposition: DISCHARGE: I believe that the patient is safe for discharge home with outpatient follow-up. Patient was informed of all pertinent physical exam, laboratory, and imaging findings. Patient's suspected etiology of their symptom presentation was discussed with the patient and all questions were answered. We discussed following up with PCP. I provided thorough ED return precautions. The patient feels safe and comfortable with this plan.  The plan for this patient was discussed with Dr. Anitra Lauth, who voiced agreement and who oversaw evaluation and treatment of this patient.  Clinical Impression:  1. SOB (shortness of breath)    Discharge  Therapies: These medications and interventions were provided for the patient while in the ED. Medications  lactated ringers bolus 500 mL (0 mLs Intravenous Stopped 12/02/22 2044)  cefadroxil (DURICEF) capsule 500 mg (500 mg Oral Given 12/02/22 1947)    MDM generated using voice dictation software and may contain dictation errors.  Please contact me for any clarification or with any questions.  Clinical Complexity A medically appropriate history, review of systems, and physical exam was performed.  Collateral history obtained from: Son, chart review, facility I personally reviewed the labs, EKG, imaging as discussed above. Patient's presentation is most consistent  with acute complicated illness / injury requiring diagnostic workup Considered and ruled out life and body threatening conditions  Treatment: Outpatient follow-up Medications: Prescription Discussed patient's care with providers from the following different specialties: Not indicated  Physical Exam   ED Triage Vitals  Enc Vitals Group     BP 12/02/22 1736 (!) 168/89     Pulse Rate 12/02/22 1736 96     Resp 12/02/22 1736 (!) 24     Temp --      Temp src --      SpO2 12/02/22 1736 97 %     Weight --      Height --      Head Circumference --      Peak Flow --      Pain Score 12/02/22 1738 0     Pain Loc --      Pain Edu? --      Excl. in GC? --      Physical Exam Vitals and nursing note reviewed.  Constitutional:      General: She is not in acute distress.    Appearance: She is well-developed.  HENT:     Head: Normocephalic and atraumatic.  Eyes:     Extraocular Movements: Extraocular movements intact.     Conjunctiva/sclera: Conjunctivae normal.  Cardiovascular:     Rate and Rhythm: Normal rate and regular rhythm.     Heart sounds: No murmur heard. Pulmonary:     Effort: Pulmonary effort is normal. No respiratory distress.     Breath sounds: Normal breath sounds.  Abdominal:     Palpations: Abdomen is soft.     Tenderness: There is no abdominal tenderness.  Musculoskeletal:  General: No swelling.     Cervical back: Neck supple.  Skin:    General: Skin is warm and dry.     Capillary Refill: Capillary refill takes less than 2 seconds.  Neurological:     Mental Status: She is alert.  Psychiatric:        Mood and Affect: Mood normal.       Procedure Note  Procedures  No orders to display    Julianne Rice, MD Emergency Medicine, PGY-2   Curley Spice, MD 12/03/22 1610    Gwyneth Sprout, MD 12/03/22 1609

## 2022-12-02 NOTE — ED Triage Notes (Signed)
Pt came in via POV from her SNF d/t stating her breathing was abnormal & she was at risk for aspiration. Pt denies any pain or difficulty breathing while in triage.

## 2022-12-05 DIAGNOSIS — S32592D Other specified fracture of left pubis, subsequent encounter for fracture with routine healing: Secondary | ICD-10-CM | POA: Diagnosis not present

## 2022-12-05 DIAGNOSIS — E785 Hyperlipidemia, unspecified: Secondary | ICD-10-CM | POA: Diagnosis not present

## 2022-12-05 DIAGNOSIS — K219 Gastro-esophageal reflux disease without esophagitis: Secondary | ICD-10-CM | POA: Diagnosis not present

## 2022-12-05 DIAGNOSIS — M1990 Unspecified osteoarthritis, unspecified site: Secondary | ICD-10-CM | POA: Diagnosis not present

## 2022-12-05 DIAGNOSIS — Z96642 Presence of left artificial hip joint: Secondary | ICD-10-CM | POA: Diagnosis not present

## 2022-12-05 DIAGNOSIS — I5032 Chronic diastolic (congestive) heart failure: Secondary | ICD-10-CM | POA: Diagnosis not present

## 2022-12-05 DIAGNOSIS — H3530 Unspecified macular degeneration: Secondary | ICD-10-CM | POA: Diagnosis not present

## 2022-12-05 DIAGNOSIS — Z9181 History of falling: Secondary | ICD-10-CM | POA: Diagnosis not present

## 2022-12-05 DIAGNOSIS — Z791 Long term (current) use of non-steroidal anti-inflammatories (NSAID): Secondary | ICD-10-CM | POA: Diagnosis not present

## 2022-12-05 DIAGNOSIS — Z96653 Presence of artificial knee joint, bilateral: Secondary | ICD-10-CM | POA: Diagnosis not present

## 2022-12-05 DIAGNOSIS — Z7951 Long term (current) use of inhaled steroids: Secondary | ICD-10-CM | POA: Diagnosis not present

## 2022-12-05 DIAGNOSIS — E890 Postprocedural hypothyroidism: Secondary | ICD-10-CM | POA: Diagnosis not present

## 2022-12-05 DIAGNOSIS — J45909 Unspecified asthma, uncomplicated: Secondary | ICD-10-CM | POA: Diagnosis not present

## 2022-12-05 DIAGNOSIS — I110 Hypertensive heart disease with heart failure: Secondary | ICD-10-CM | POA: Diagnosis not present

## 2022-12-05 DIAGNOSIS — I700 Atherosclerosis of aorta: Secondary | ICD-10-CM | POA: Diagnosis not present

## 2022-12-06 DIAGNOSIS — H3530 Unspecified macular degeneration: Secondary | ICD-10-CM | POA: Diagnosis not present

## 2022-12-06 DIAGNOSIS — Z791 Long term (current) use of non-steroidal anti-inflammatories (NSAID): Secondary | ICD-10-CM | POA: Diagnosis not present

## 2022-12-06 DIAGNOSIS — I700 Atherosclerosis of aorta: Secondary | ICD-10-CM | POA: Diagnosis not present

## 2022-12-06 DIAGNOSIS — S32592D Other specified fracture of left pubis, subsequent encounter for fracture with routine healing: Secondary | ICD-10-CM | POA: Diagnosis not present

## 2022-12-06 DIAGNOSIS — Z96642 Presence of left artificial hip joint: Secondary | ICD-10-CM | POA: Diagnosis not present

## 2022-12-06 DIAGNOSIS — Z9181 History of falling: Secondary | ICD-10-CM | POA: Diagnosis not present

## 2022-12-06 DIAGNOSIS — E785 Hyperlipidemia, unspecified: Secondary | ICD-10-CM | POA: Diagnosis not present

## 2022-12-06 DIAGNOSIS — I110 Hypertensive heart disease with heart failure: Secondary | ICD-10-CM | POA: Diagnosis not present

## 2022-12-06 DIAGNOSIS — Z7951 Long term (current) use of inhaled steroids: Secondary | ICD-10-CM | POA: Diagnosis not present

## 2022-12-06 DIAGNOSIS — Z96653 Presence of artificial knee joint, bilateral: Secondary | ICD-10-CM | POA: Diagnosis not present

## 2022-12-06 DIAGNOSIS — M1990 Unspecified osteoarthritis, unspecified site: Secondary | ICD-10-CM | POA: Diagnosis not present

## 2022-12-06 DIAGNOSIS — J45909 Unspecified asthma, uncomplicated: Secondary | ICD-10-CM | POA: Diagnosis not present

## 2022-12-06 DIAGNOSIS — E890 Postprocedural hypothyroidism: Secondary | ICD-10-CM | POA: Diagnosis not present

## 2022-12-06 DIAGNOSIS — K219 Gastro-esophageal reflux disease without esophagitis: Secondary | ICD-10-CM | POA: Diagnosis not present

## 2022-12-06 DIAGNOSIS — I5032 Chronic diastolic (congestive) heart failure: Secondary | ICD-10-CM | POA: Diagnosis not present

## 2022-12-08 DIAGNOSIS — E785 Hyperlipidemia, unspecified: Secondary | ICD-10-CM | POA: Diagnosis not present

## 2022-12-08 DIAGNOSIS — J45909 Unspecified asthma, uncomplicated: Secondary | ICD-10-CM | POA: Diagnosis not present

## 2022-12-08 DIAGNOSIS — Z9181 History of falling: Secondary | ICD-10-CM | POA: Diagnosis not present

## 2022-12-08 DIAGNOSIS — M1990 Unspecified osteoarthritis, unspecified site: Secondary | ICD-10-CM | POA: Diagnosis not present

## 2022-12-08 DIAGNOSIS — E890 Postprocedural hypothyroidism: Secondary | ICD-10-CM | POA: Diagnosis not present

## 2022-12-08 DIAGNOSIS — Z7951 Long term (current) use of inhaled steroids: Secondary | ICD-10-CM | POA: Diagnosis not present

## 2022-12-08 DIAGNOSIS — S32592D Other specified fracture of left pubis, subsequent encounter for fracture with routine healing: Secondary | ICD-10-CM | POA: Diagnosis not present

## 2022-12-08 DIAGNOSIS — I110 Hypertensive heart disease with heart failure: Secondary | ICD-10-CM | POA: Diagnosis not present

## 2022-12-08 DIAGNOSIS — Z96653 Presence of artificial knee joint, bilateral: Secondary | ICD-10-CM | POA: Diagnosis not present

## 2022-12-08 DIAGNOSIS — I5032 Chronic diastolic (congestive) heart failure: Secondary | ICD-10-CM | POA: Diagnosis not present

## 2022-12-08 DIAGNOSIS — Z791 Long term (current) use of non-steroidal anti-inflammatories (NSAID): Secondary | ICD-10-CM | POA: Diagnosis not present

## 2022-12-08 DIAGNOSIS — Z96642 Presence of left artificial hip joint: Secondary | ICD-10-CM | POA: Diagnosis not present

## 2022-12-08 DIAGNOSIS — I700 Atherosclerosis of aorta: Secondary | ICD-10-CM | POA: Diagnosis not present

## 2022-12-08 DIAGNOSIS — K219 Gastro-esophageal reflux disease without esophagitis: Secondary | ICD-10-CM | POA: Diagnosis not present

## 2022-12-08 DIAGNOSIS — H3530 Unspecified macular degeneration: Secondary | ICD-10-CM | POA: Diagnosis not present

## 2022-12-08 DIAGNOSIS — I10 Essential (primary) hypertension: Secondary | ICD-10-CM | POA: Diagnosis not present

## 2022-12-12 DIAGNOSIS — Z96653 Presence of artificial knee joint, bilateral: Secondary | ICD-10-CM | POA: Diagnosis not present

## 2022-12-12 DIAGNOSIS — N390 Urinary tract infection, site not specified: Secondary | ICD-10-CM | POA: Diagnosis not present

## 2022-12-12 DIAGNOSIS — I5032 Chronic diastolic (congestive) heart failure: Secondary | ICD-10-CM | POA: Diagnosis not present

## 2022-12-12 DIAGNOSIS — K219 Gastro-esophageal reflux disease without esophagitis: Secondary | ICD-10-CM | POA: Diagnosis not present

## 2022-12-12 DIAGNOSIS — R0989 Other specified symptoms and signs involving the circulatory and respiratory systems: Secondary | ICD-10-CM | POA: Diagnosis not present

## 2022-12-12 DIAGNOSIS — M1990 Unspecified osteoarthritis, unspecified site: Secondary | ICD-10-CM | POA: Diagnosis not present

## 2022-12-12 DIAGNOSIS — H3530 Unspecified macular degeneration: Secondary | ICD-10-CM | POA: Diagnosis not present

## 2022-12-12 DIAGNOSIS — E785 Hyperlipidemia, unspecified: Secondary | ICD-10-CM | POA: Diagnosis not present

## 2022-12-12 DIAGNOSIS — Z96642 Presence of left artificial hip joint: Secondary | ICD-10-CM | POA: Diagnosis not present

## 2022-12-12 DIAGNOSIS — S32592D Other specified fracture of left pubis, subsequent encounter for fracture with routine healing: Secondary | ICD-10-CM | POA: Diagnosis not present

## 2022-12-12 DIAGNOSIS — R0602 Shortness of breath: Secondary | ICD-10-CM | POA: Diagnosis not present

## 2022-12-12 DIAGNOSIS — J45909 Unspecified asthma, uncomplicated: Secondary | ICD-10-CM | POA: Diagnosis not present

## 2022-12-12 DIAGNOSIS — I700 Atherosclerosis of aorta: Secondary | ICD-10-CM | POA: Diagnosis not present

## 2022-12-12 DIAGNOSIS — I110 Hypertensive heart disease with heart failure: Secondary | ICD-10-CM | POA: Diagnosis not present

## 2022-12-12 DIAGNOSIS — E890 Postprocedural hypothyroidism: Secondary | ICD-10-CM | POA: Diagnosis not present

## 2022-12-12 DIAGNOSIS — Z791 Long term (current) use of non-steroidal anti-inflammatories (NSAID): Secondary | ICD-10-CM | POA: Diagnosis not present

## 2022-12-12 DIAGNOSIS — Z7951 Long term (current) use of inhaled steroids: Secondary | ICD-10-CM | POA: Diagnosis not present

## 2022-12-12 DIAGNOSIS — Z9181 History of falling: Secondary | ICD-10-CM | POA: Diagnosis not present

## 2022-12-13 DIAGNOSIS — S32592D Other specified fracture of left pubis, subsequent encounter for fracture with routine healing: Secondary | ICD-10-CM | POA: Diagnosis not present

## 2022-12-13 DIAGNOSIS — H3530 Unspecified macular degeneration: Secondary | ICD-10-CM | POA: Diagnosis not present

## 2022-12-13 DIAGNOSIS — Z96653 Presence of artificial knee joint, bilateral: Secondary | ICD-10-CM | POA: Diagnosis not present

## 2022-12-13 DIAGNOSIS — Z96642 Presence of left artificial hip joint: Secondary | ICD-10-CM | POA: Diagnosis not present

## 2022-12-13 DIAGNOSIS — Z7951 Long term (current) use of inhaled steroids: Secondary | ICD-10-CM | POA: Diagnosis not present

## 2022-12-13 DIAGNOSIS — I5032 Chronic diastolic (congestive) heart failure: Secondary | ICD-10-CM | POA: Diagnosis not present

## 2022-12-13 DIAGNOSIS — I700 Atherosclerosis of aorta: Secondary | ICD-10-CM | POA: Diagnosis not present

## 2022-12-13 DIAGNOSIS — J45909 Unspecified asthma, uncomplicated: Secondary | ICD-10-CM | POA: Diagnosis not present

## 2022-12-13 DIAGNOSIS — M1990 Unspecified osteoarthritis, unspecified site: Secondary | ICD-10-CM | POA: Diagnosis not present

## 2022-12-13 DIAGNOSIS — Z9181 History of falling: Secondary | ICD-10-CM | POA: Diagnosis not present

## 2022-12-13 DIAGNOSIS — I110 Hypertensive heart disease with heart failure: Secondary | ICD-10-CM | POA: Diagnosis not present

## 2022-12-13 DIAGNOSIS — E785 Hyperlipidemia, unspecified: Secondary | ICD-10-CM | POA: Diagnosis not present

## 2022-12-13 DIAGNOSIS — Z791 Long term (current) use of non-steroidal anti-inflammatories (NSAID): Secondary | ICD-10-CM | POA: Diagnosis not present

## 2022-12-13 DIAGNOSIS — E890 Postprocedural hypothyroidism: Secondary | ICD-10-CM | POA: Diagnosis not present

## 2022-12-13 DIAGNOSIS — K219 Gastro-esophageal reflux disease without esophagitis: Secondary | ICD-10-CM | POA: Diagnosis not present

## 2022-12-15 DIAGNOSIS — I700 Atherosclerosis of aorta: Secondary | ICD-10-CM | POA: Diagnosis not present

## 2022-12-15 DIAGNOSIS — H3530 Unspecified macular degeneration: Secondary | ICD-10-CM | POA: Diagnosis not present

## 2022-12-15 DIAGNOSIS — I10 Essential (primary) hypertension: Secondary | ICD-10-CM | POA: Diagnosis not present

## 2022-12-15 DIAGNOSIS — Z791 Long term (current) use of non-steroidal anti-inflammatories (NSAID): Secondary | ICD-10-CM | POA: Diagnosis not present

## 2022-12-15 DIAGNOSIS — E890 Postprocedural hypothyroidism: Secondary | ICD-10-CM | POA: Diagnosis not present

## 2022-12-15 DIAGNOSIS — K219 Gastro-esophageal reflux disease without esophagitis: Secondary | ICD-10-CM | POA: Diagnosis not present

## 2022-12-15 DIAGNOSIS — J45909 Unspecified asthma, uncomplicated: Secondary | ICD-10-CM | POA: Diagnosis not present

## 2022-12-15 DIAGNOSIS — Z96653 Presence of artificial knee joint, bilateral: Secondary | ICD-10-CM | POA: Diagnosis not present

## 2022-12-15 DIAGNOSIS — Z7951 Long term (current) use of inhaled steroids: Secondary | ICD-10-CM | POA: Diagnosis not present

## 2022-12-15 DIAGNOSIS — I5032 Chronic diastolic (congestive) heart failure: Secondary | ICD-10-CM | POA: Diagnosis not present

## 2022-12-15 DIAGNOSIS — Z9181 History of falling: Secondary | ICD-10-CM | POA: Diagnosis not present

## 2022-12-15 DIAGNOSIS — I110 Hypertensive heart disease with heart failure: Secondary | ICD-10-CM | POA: Diagnosis not present

## 2022-12-15 DIAGNOSIS — Z96642 Presence of left artificial hip joint: Secondary | ICD-10-CM | POA: Diagnosis not present

## 2022-12-15 DIAGNOSIS — M1990 Unspecified osteoarthritis, unspecified site: Secondary | ICD-10-CM | POA: Diagnosis not present

## 2022-12-15 DIAGNOSIS — S32592D Other specified fracture of left pubis, subsequent encounter for fracture with routine healing: Secondary | ICD-10-CM | POA: Diagnosis not present

## 2022-12-15 DIAGNOSIS — E785 Hyperlipidemia, unspecified: Secondary | ICD-10-CM | POA: Diagnosis not present

## 2022-12-19 DIAGNOSIS — I110 Hypertensive heart disease with heart failure: Secondary | ICD-10-CM | POA: Diagnosis not present

## 2022-12-19 DIAGNOSIS — I5032 Chronic diastolic (congestive) heart failure: Secondary | ICD-10-CM | POA: Diagnosis not present

## 2022-12-19 DIAGNOSIS — M1990 Unspecified osteoarthritis, unspecified site: Secondary | ICD-10-CM | POA: Diagnosis not present

## 2022-12-19 DIAGNOSIS — Z96653 Presence of artificial knee joint, bilateral: Secondary | ICD-10-CM | POA: Diagnosis not present

## 2022-12-19 DIAGNOSIS — Z9181 History of falling: Secondary | ICD-10-CM | POA: Diagnosis not present

## 2022-12-19 DIAGNOSIS — I10 Essential (primary) hypertension: Secondary | ICD-10-CM | POA: Diagnosis not present

## 2022-12-19 DIAGNOSIS — Z96642 Presence of left artificial hip joint: Secondary | ICD-10-CM | POA: Diagnosis not present

## 2022-12-19 DIAGNOSIS — Z7951 Long term (current) use of inhaled steroids: Secondary | ICD-10-CM | POA: Diagnosis not present

## 2022-12-19 DIAGNOSIS — E785 Hyperlipidemia, unspecified: Secondary | ICD-10-CM | POA: Diagnosis not present

## 2022-12-19 DIAGNOSIS — I700 Atherosclerosis of aorta: Secondary | ICD-10-CM | POA: Diagnosis not present

## 2022-12-19 DIAGNOSIS — S32592D Other specified fracture of left pubis, subsequent encounter for fracture with routine healing: Secondary | ICD-10-CM | POA: Diagnosis not present

## 2022-12-19 DIAGNOSIS — Z791 Long term (current) use of non-steroidal anti-inflammatories (NSAID): Secondary | ICD-10-CM | POA: Diagnosis not present

## 2022-12-19 DIAGNOSIS — H3530 Unspecified macular degeneration: Secondary | ICD-10-CM | POA: Diagnosis not present

## 2022-12-19 DIAGNOSIS — J45909 Unspecified asthma, uncomplicated: Secondary | ICD-10-CM | POA: Diagnosis not present

## 2022-12-19 DIAGNOSIS — K219 Gastro-esophageal reflux disease without esophagitis: Secondary | ICD-10-CM | POA: Diagnosis not present

## 2022-12-19 DIAGNOSIS — E890 Postprocedural hypothyroidism: Secondary | ICD-10-CM | POA: Diagnosis not present

## 2022-12-22 DIAGNOSIS — I700 Atherosclerosis of aorta: Secondary | ICD-10-CM | POA: Diagnosis not present

## 2022-12-22 DIAGNOSIS — K219 Gastro-esophageal reflux disease without esophagitis: Secondary | ICD-10-CM | POA: Diagnosis not present

## 2022-12-22 DIAGNOSIS — Z7951 Long term (current) use of inhaled steroids: Secondary | ICD-10-CM | POA: Diagnosis not present

## 2022-12-22 DIAGNOSIS — I5032 Chronic diastolic (congestive) heart failure: Secondary | ICD-10-CM | POA: Diagnosis not present

## 2022-12-22 DIAGNOSIS — Z96642 Presence of left artificial hip joint: Secondary | ICD-10-CM | POA: Diagnosis not present

## 2022-12-22 DIAGNOSIS — E785 Hyperlipidemia, unspecified: Secondary | ICD-10-CM | POA: Diagnosis not present

## 2022-12-22 DIAGNOSIS — M1990 Unspecified osteoarthritis, unspecified site: Secondary | ICD-10-CM | POA: Diagnosis not present

## 2022-12-22 DIAGNOSIS — Z96653 Presence of artificial knee joint, bilateral: Secondary | ICD-10-CM | POA: Diagnosis not present

## 2022-12-22 DIAGNOSIS — E890 Postprocedural hypothyroidism: Secondary | ICD-10-CM | POA: Diagnosis not present

## 2022-12-22 DIAGNOSIS — H3530 Unspecified macular degeneration: Secondary | ICD-10-CM | POA: Diagnosis not present

## 2022-12-22 DIAGNOSIS — I10 Essential (primary) hypertension: Secondary | ICD-10-CM | POA: Diagnosis not present

## 2022-12-22 DIAGNOSIS — Z9181 History of falling: Secondary | ICD-10-CM | POA: Diagnosis not present

## 2022-12-22 DIAGNOSIS — I110 Hypertensive heart disease with heart failure: Secondary | ICD-10-CM | POA: Diagnosis not present

## 2022-12-22 DIAGNOSIS — Z791 Long term (current) use of non-steroidal anti-inflammatories (NSAID): Secondary | ICD-10-CM | POA: Diagnosis not present

## 2022-12-22 DIAGNOSIS — S32592D Other specified fracture of left pubis, subsequent encounter for fracture with routine healing: Secondary | ICD-10-CM | POA: Diagnosis not present

## 2022-12-22 DIAGNOSIS — J45909 Unspecified asthma, uncomplicated: Secondary | ICD-10-CM | POA: Diagnosis not present

## 2022-12-26 DIAGNOSIS — Z96653 Presence of artificial knee joint, bilateral: Secondary | ICD-10-CM | POA: Diagnosis not present

## 2022-12-26 DIAGNOSIS — I5032 Chronic diastolic (congestive) heart failure: Secondary | ICD-10-CM | POA: Diagnosis not present

## 2022-12-26 DIAGNOSIS — Z791 Long term (current) use of non-steroidal anti-inflammatories (NSAID): Secondary | ICD-10-CM | POA: Diagnosis not present

## 2022-12-26 DIAGNOSIS — S32592D Other specified fracture of left pubis, subsequent encounter for fracture with routine healing: Secondary | ICD-10-CM | POA: Diagnosis not present

## 2022-12-26 DIAGNOSIS — J45909 Unspecified asthma, uncomplicated: Secondary | ICD-10-CM | POA: Diagnosis not present

## 2022-12-26 DIAGNOSIS — E890 Postprocedural hypothyroidism: Secondary | ICD-10-CM | POA: Diagnosis not present

## 2022-12-26 DIAGNOSIS — M1990 Unspecified osteoarthritis, unspecified site: Secondary | ICD-10-CM | POA: Diagnosis not present

## 2022-12-26 DIAGNOSIS — I10 Essential (primary) hypertension: Secondary | ICD-10-CM | POA: Diagnosis not present

## 2022-12-26 DIAGNOSIS — I700 Atherosclerosis of aorta: Secondary | ICD-10-CM | POA: Diagnosis not present

## 2022-12-26 DIAGNOSIS — I110 Hypertensive heart disease with heart failure: Secondary | ICD-10-CM | POA: Diagnosis not present

## 2022-12-26 DIAGNOSIS — H3530 Unspecified macular degeneration: Secondary | ICD-10-CM | POA: Diagnosis not present

## 2022-12-26 DIAGNOSIS — Z7951 Long term (current) use of inhaled steroids: Secondary | ICD-10-CM | POA: Diagnosis not present

## 2022-12-26 DIAGNOSIS — Z96642 Presence of left artificial hip joint: Secondary | ICD-10-CM | POA: Diagnosis not present

## 2022-12-26 DIAGNOSIS — S81802A Unspecified open wound, left lower leg, initial encounter: Secondary | ICD-10-CM | POA: Diagnosis not present

## 2022-12-26 DIAGNOSIS — Z9181 History of falling: Secondary | ICD-10-CM | POA: Diagnosis not present

## 2022-12-26 DIAGNOSIS — E785 Hyperlipidemia, unspecified: Secondary | ICD-10-CM | POA: Diagnosis not present

## 2022-12-26 DIAGNOSIS — K219 Gastro-esophageal reflux disease without esophagitis: Secondary | ICD-10-CM | POA: Diagnosis not present

## 2022-12-28 DIAGNOSIS — I700 Atherosclerosis of aorta: Secondary | ICD-10-CM | POA: Diagnosis not present

## 2022-12-28 DIAGNOSIS — J45909 Unspecified asthma, uncomplicated: Secondary | ICD-10-CM | POA: Diagnosis not present

## 2022-12-28 DIAGNOSIS — M1990 Unspecified osteoarthritis, unspecified site: Secondary | ICD-10-CM | POA: Diagnosis not present

## 2022-12-28 DIAGNOSIS — I5032 Chronic diastolic (congestive) heart failure: Secondary | ICD-10-CM | POA: Diagnosis not present

## 2022-12-28 DIAGNOSIS — Z791 Long term (current) use of non-steroidal anti-inflammatories (NSAID): Secondary | ICD-10-CM | POA: Diagnosis not present

## 2022-12-28 DIAGNOSIS — E890 Postprocedural hypothyroidism: Secondary | ICD-10-CM | POA: Diagnosis not present

## 2022-12-28 DIAGNOSIS — I10 Essential (primary) hypertension: Secondary | ICD-10-CM | POA: Diagnosis not present

## 2022-12-28 DIAGNOSIS — Z96642 Presence of left artificial hip joint: Secondary | ICD-10-CM | POA: Diagnosis not present

## 2022-12-28 DIAGNOSIS — Z7951 Long term (current) use of inhaled steroids: Secondary | ICD-10-CM | POA: Diagnosis not present

## 2022-12-28 DIAGNOSIS — S32592D Other specified fracture of left pubis, subsequent encounter for fracture with routine healing: Secondary | ICD-10-CM | POA: Diagnosis not present

## 2022-12-28 DIAGNOSIS — I110 Hypertensive heart disease with heart failure: Secondary | ICD-10-CM | POA: Diagnosis not present

## 2022-12-28 DIAGNOSIS — Z96653 Presence of artificial knee joint, bilateral: Secondary | ICD-10-CM | POA: Diagnosis not present

## 2022-12-28 DIAGNOSIS — H3530 Unspecified macular degeneration: Secondary | ICD-10-CM | POA: Diagnosis not present

## 2022-12-28 DIAGNOSIS — K219 Gastro-esophageal reflux disease without esophagitis: Secondary | ICD-10-CM | POA: Diagnosis not present

## 2022-12-28 DIAGNOSIS — Z9181 History of falling: Secondary | ICD-10-CM | POA: Diagnosis not present

## 2022-12-28 DIAGNOSIS — E785 Hyperlipidemia, unspecified: Secondary | ICD-10-CM | POA: Diagnosis not present

## 2023-01-01 ENCOUNTER — Emergency Department (HOSPITAL_COMMUNITY)
Admission: EM | Admit: 2023-01-01 | Discharge: 2023-01-01 | Disposition: A | Discharge status: Home / Self Care | Payer: Medicare Other | Attending: Emergency Medicine | Admitting: Emergency Medicine

## 2023-01-01 ENCOUNTER — Emergency Department (HOSPITAL_COMMUNITY): Payer: Medicare Other

## 2023-01-01 ENCOUNTER — Encounter (HOSPITAL_COMMUNITY): Payer: Self-pay | Admitting: *Deleted

## 2023-01-01 ENCOUNTER — Other Ambulatory Visit: Payer: Self-pay

## 2023-01-01 DIAGNOSIS — Z23 Encounter for immunization: Secondary | ICD-10-CM | POA: Diagnosis not present

## 2023-01-01 DIAGNOSIS — S0990XA Unspecified injury of head, initial encounter: Secondary | ICD-10-CM | POA: Diagnosis not present

## 2023-01-01 DIAGNOSIS — Z743 Need for continuous supervision: Secondary | ICD-10-CM | POA: Diagnosis not present

## 2023-01-01 DIAGNOSIS — S40022A Contusion of left upper arm, initial encounter: Secondary | ICD-10-CM | POA: Diagnosis not present

## 2023-01-01 DIAGNOSIS — W19XXXA Unspecified fall, initial encounter: Secondary | ICD-10-CM | POA: Insufficient documentation

## 2023-01-01 DIAGNOSIS — Z79899 Other long term (current) drug therapy: Secondary | ICD-10-CM | POA: Insufficient documentation

## 2023-01-01 DIAGNOSIS — R6889 Other general symptoms and signs: Secondary | ICD-10-CM | POA: Diagnosis not present

## 2023-01-01 DIAGNOSIS — S41112A Laceration without foreign body of left upper arm, initial encounter: Secondary | ICD-10-CM | POA: Insufficient documentation

## 2023-01-01 DIAGNOSIS — S40912A Unspecified superficial injury of left shoulder, initial encounter: Secondary | ICD-10-CM | POA: Diagnosis present

## 2023-01-01 MED ORDER — TETANUS-DIPHTH-ACELL PERTUSSIS 5-2.5-18.5 LF-MCG/0.5 IM SUSY
0.5000 mL | PREFILLED_SYRINGE | Freq: Once | INTRAMUSCULAR | Status: AC
  Administered 2023-01-01: 0.5 mL via INTRAMUSCULAR
  Filled 2023-01-01: qty 0.5

## 2023-01-01 NOTE — Discharge Instructions (Signed)
You were seen in the emergency department for evaluation of injuries from a fall.  You had a skin tear area of your left arm that was cleaned and dressed.  You had a CAT scan of your head that was unremarkable.  Please keep wound clean daily soap and water.  Return if any signs of infection or other concerns.

## 2023-01-01 NOTE — ED Triage Notes (Signed)
Pt BIB [facility] for c/o skin tear to left upper arm; pt does not know how she got it. States she does not recall falling or hitting her arm against anything

## 2023-01-01 NOTE — ED Notes (Signed)
[facility] contacted regarding transport. No answer. Voicemail left.

## 2023-01-01 NOTE — ED Provider Notes (Signed)
[place] EMERGENCY DEPARTMENT AT [facility] Provider Note   CSN: [id number] Arrival date & time: 01/01/23  4782     History  Chief Complaint  Patient presents with   Abrasion    Lindsey Butler is a 89 y.o. female.  She is brought in for evaluation of skin tear to her left arm.  Patient tells me she fell but she cannot recall when she fell why she fell or how she fell.  She does not endorse any pain.  She is visually impaired and she states she can walk but uses a walker or needs assistance.  She is not on any blood thinners.  The history is provided by the patient and the EMS personnel.  Laceration Location:  Shoulder/arm Shoulder/arm laceration location:  L upper arm Length:  6 Depth:  Cutaneous Quality: jagged   Pain details:    Severity:  No pain Foreign body present:  No foreign bodies Ineffective treatments:  None tried Tetanus status:  Unknown Associated symptoms: no fever        Home Medications Prior to Admission medications   Medication Sig Start Date End Date Taking? Authorizing Provider  acetaminophen (TYLENOL) 500 MG tablet Take 2 tablets (1,000 mg total) by mouth every 8 (eight) hours. 09/09/22   Vassie Loll, MD  budesonide (PULMICORT) 0.5 MG/2ML nebulizer solution Take 2 mLs (0.5 mg total) by nebulization 2 (two) times daily. Patient not taking: Reported on 12/02/2022 03/05/21   Shon Hale, MD  calcium carbonate (OSCAL) 1500 (600 Ca) MG TABS tablet Take 1,500 mg by mouth daily.    [provider]  cholecalciferol (VITAMIN D) 25 MCG (1000 UNIT) tablet Take 1,000 Units by mouth daily. 11/07/20   [provider]  furosemide (LASIX) 20 MG tablet Take 1 tablet (20 mg total) by mouth daily as needed for fluid or edema. Patient not taking: Reported on 12/02/2022 09/09/22   Vassie Loll, MD  levothyroxine (SYNTHROID) 100 MCG tablet Take 100 mcg by mouth daily. 08/19/22   [provider]  losartan (COZAAR) 50 MG tablet Take  50 mg by mouth daily. 08/19/22   [provider]  meloxicam (MOBIC) 15 MG tablet Take 15 mg by mouth daily.    [provider]  methocarbamol (ROBAXIN) 500 MG tablet Take 1 tablet (500 mg total) by mouth every 8 (eight) hours as needed for muscle spasms. Patient not taking: Reported on 12/02/2022 09/09/22   Vassie Loll, MD  montelukast (SINGULAIR) 10 MG tablet Take 1 tablet (10 mg total) by mouth at bedtime. 11/01/19   Sharee Holster, NP  omeprazole (PRILOSEC) 40 MG capsule Take 40 mg by mouth daily.    [provider]  polyethylene glycol (MIRALAX / GLYCOLAX) 17 g packet Take 17 g by mouth daily as needed for mild constipation. Patient not taking: Reported on 12/02/2022 09/09/22   Vassie Loll, MD  predniSONE (DELTASONE) 5 MG tablet Take 5 mg by mouth daily. 08/19/22   [provider]  rosuvastatin (CRESTOR) 10 MG tablet Take 10 mg by mouth at bedtime. 08/19/22   [provider]  senna (SENOKOT) 8.6 MG TABS tablet Take 1 tablet (8.6 mg total) by mouth 2 (two) times daily. Hold for diarrhea. 09/09/22   Vassie Loll, MD  sertraline (ZOLOFT) 50 MG tablet Take 50 mg by mouth daily. 08/19/22   [provider]  vitamin B-12 (CYANOCOBALAMIN) 1000 MCG tablet Take 1,000 mcg by mouth daily. 02/24/21   [provider]  Allergies    Patient has no known allergies.    Review of Systems   Review of Systems  Constitutional:  Negative for fever.  Eyes:  Positive for visual disturbance.  Musculoskeletal:  Negative for neck pain.  Skin:  Positive for wound.  Neurological:  Negative for headaches.    Physical Exam Updated Vital Signs BP (!) 186/75   Pulse 69   Temp 98.4 F (36.9 C) (Oral)   Resp 18   Ht 5\' 7"  (1.702 m)   Wt 57.2 kg   SpO2 95%   BMI 19.73 kg/m  Physical Exam Vitals and nursing note reviewed.  Constitutional:      General: She is not in acute distress.    Appearance: Normal appearance. She is well-developed.  HENT:      Head: Normocephalic and atraumatic.  Eyes:     Conjunctiva/sclera: Conjunctivae normal.  Cardiovascular:     Rate and Rhythm: Normal rate and regular rhythm.     Heart sounds: No murmur heard. Pulmonary:     Effort: Pulmonary effort is normal. No respiratory distress.     Breath sounds: Normal breath sounds. No stridor. No wheezing.  Abdominal:     Palpations: Abdomen is soft.     Tenderness: There is no abdominal tenderness. There is no guarding or rebound.  Musculoskeletal:        General: Signs of injury present. Normal range of motion.     Cervical back: Neck supple.     Comments: He has some bruising and a skin tear to her left upper arm.  There is associated clot with it.  She has full range of motion without any bony tenderness.  Distal pulses motor and sensation intact.  Skin:    General: Skin is warm and dry.  Neurological:     General: No focal deficit present.     Mental Status: She is alert. She is disoriented.     GCS: GCS eye subscore is 4. GCS verbal subscore is 5. GCS motor subscore is 6.     Motor: No weakness.     ED Results / Procedures / Treatments   Labs (all labs ordered are listed, but only abnormal results are displayed) Labs Reviewed - No data to display  EKG None  Radiology CT Head Wo Contrast  Result Date: 01/01/2023 CLINICAL DATA:  Head trauma, minor (Age >= 65y). EXAM: CT HEAD WITHOUT CONTRAST TECHNIQUE: Contiguous axial images were obtained from the base of the skull through the vertex without intravenous contrast. RADIATION DOSE REDUCTION: This exam was performed according to the departmental dose-optimization program which includes automated exposure control, adjustment of the mA and/or kV according to patient size and/or use of iterative reconstruction technique. COMPARISON:  Head CT report from 03/06/2020 (images not available) FINDINGS: Brain: There is no evidence of an acute cortically based infarct, intracranial hemorrhage, mass, midline  shift, or extra-axial fluid collection. Patchy and confluent hypodensities in the cerebral white matter and deep gray nuclei bilaterally are nonspecific but compatible with extensive chronic small vessel ischemic disease. Cerebral atrophy is mild for age. Vascular: Calcified atherosclerosis at the skull base. No hyperdense vessel. Skull: No acute fracture or suspicious osseous lesion. Sinuses/Orbits: Mild mucosal thickening in the paranasal sinuses. Clear mastoid air cells. Bilateral cataract extraction. Other: None. IMPRESSION: 1. No evidence of acute intracranial abnormality. 2. Extensive chronic small vessel ischemic disease. Electronically Signed   By: Sebastian Ache M.D.   On: 01/01/2023 11:04    Procedures Procedures  Medications Ordered in ED Medications - No data to display  ED Course/ Medical Decision Making/ A&P Clinical Course as of 01/01/23 1745  Sun Jan 01, 2023  1109 CT head without acute findings.  Nurse cleaned wound and updated tetanus.  Patient can return back to her facility for further management. [MB]  1232 I was able to reach the patient's family member who is going to come up and take her back to her facility. [MB]    Clinical Course User Index [MB] Terrilee Files, MD                             Medical Decision Making Amount and/or Complexity of Data Reviewed Radiology: ordered.  Risk Prescription drug management.   This patient complains of fall wound on left arm; this involves an extensive number of treatment Options and is a complaint that carries with it a high risk of complications and morbidity. The differential includes laceration, skin tear, bleed, fracture  I ordered medication tetanus update and reviewed PMP when indicated. I ordered imaging studies which included CT head and I independently    visualized and interpreted imaging which showed no acute findings Additional history obtained from EMS Previous records obtained and reviewed patient  with ED visits recently for shortness of breath pneumonia fall Cardiac monitoring reviewed, normal sinus rhythm Social determinants considered, patient with dementia unable to advocate for self Critical Interventions: None  After the interventions stated above, I reevaluated the patient and found patient to be awake alert in no distress Admission and further testing considered, no indications for admission or further workup at this time.  I was ultimately able to reach patient's family member and they are coming to pick her up.         Final Clinical Impression(s) / ED Diagnoses Final diagnoses:  Skin tear of left upper arm without complication, initial encounter  Fall, initial encounter    Rx / DC Orders ED Discharge Orders     None         Terrilee Files, MD 01/01/23 [phone]

## 2023-01-03 DIAGNOSIS — E890 Postprocedural hypothyroidism: Secondary | ICD-10-CM | POA: Diagnosis not present

## 2023-01-03 DIAGNOSIS — E039 Hypothyroidism, unspecified: Secondary | ICD-10-CM | POA: Diagnosis not present

## 2023-01-03 DIAGNOSIS — H3530 Unspecified macular degeneration: Secondary | ICD-10-CM | POA: Diagnosis not present

## 2023-01-03 DIAGNOSIS — E785 Hyperlipidemia, unspecified: Secondary | ICD-10-CM | POA: Diagnosis not present

## 2023-01-03 DIAGNOSIS — M6281 Muscle weakness (generalized): Secondary | ICD-10-CM | POA: Diagnosis not present

## 2023-01-03 DIAGNOSIS — Z96653 Presence of artificial knee joint, bilateral: Secondary | ICD-10-CM | POA: Diagnosis not present

## 2023-01-03 DIAGNOSIS — I110 Hypertensive heart disease with heart failure: Secondary | ICD-10-CM | POA: Diagnosis not present

## 2023-01-03 DIAGNOSIS — K219 Gastro-esophageal reflux disease without esophagitis: Secondary | ICD-10-CM | POA: Diagnosis not present

## 2023-01-03 DIAGNOSIS — S81802D Unspecified open wound, left lower leg, subsequent encounter: Secondary | ICD-10-CM | POA: Diagnosis not present

## 2023-01-03 DIAGNOSIS — Z791 Long term (current) use of non-steroidal anti-inflammatories (NSAID): Secondary | ICD-10-CM | POA: Diagnosis not present

## 2023-01-03 DIAGNOSIS — J45909 Unspecified asthma, uncomplicated: Secondary | ICD-10-CM | POA: Diagnosis not present

## 2023-01-03 DIAGNOSIS — Z96642 Presence of left artificial hip joint: Secondary | ICD-10-CM | POA: Diagnosis not present

## 2023-01-03 DIAGNOSIS — W010XXA Fall on same level from slipping, tripping and stumbling without subsequent striking against object, initial encounter: Secondary | ICD-10-CM | POA: Diagnosis not present

## 2023-01-03 DIAGNOSIS — I5032 Chronic diastolic (congestive) heart failure: Secondary | ICD-10-CM | POA: Diagnosis not present

## 2023-01-03 DIAGNOSIS — Z9181 History of falling: Secondary | ICD-10-CM | POA: Diagnosis not present

## 2023-01-03 DIAGNOSIS — Z7951 Long term (current) use of inhaled steroids: Secondary | ICD-10-CM | POA: Diagnosis not present

## 2023-01-03 DIAGNOSIS — I10 Essential (primary) hypertension: Secondary | ICD-10-CM | POA: Diagnosis not present

## 2023-01-03 DIAGNOSIS — I700 Atherosclerosis of aorta: Secondary | ICD-10-CM | POA: Diagnosis not present

## 2023-01-03 DIAGNOSIS — M1990 Unspecified osteoarthritis, unspecified site: Secondary | ICD-10-CM | POA: Diagnosis not present

## 2023-01-03 DIAGNOSIS — S32592D Other specified fracture of left pubis, subsequent encounter for fracture with routine healing: Secondary | ICD-10-CM | POA: Diagnosis not present

## 2023-01-04 DIAGNOSIS — I10 Essential (primary) hypertension: Secondary | ICD-10-CM | POA: Diagnosis not present

## 2023-01-04 DIAGNOSIS — M1990 Unspecified osteoarthritis, unspecified site: Secondary | ICD-10-CM | POA: Diagnosis not present

## 2023-01-04 DIAGNOSIS — S32592D Other specified fracture of left pubis, subsequent encounter for fracture with routine healing: Secondary | ICD-10-CM | POA: Diagnosis not present

## 2023-01-04 DIAGNOSIS — E785 Hyperlipidemia, unspecified: Secondary | ICD-10-CM | POA: Diagnosis not present

## 2023-01-04 DIAGNOSIS — E890 Postprocedural hypothyroidism: Secondary | ICD-10-CM | POA: Diagnosis not present

## 2023-01-04 DIAGNOSIS — I110 Hypertensive heart disease with heart failure: Secondary | ICD-10-CM | POA: Diagnosis not present

## 2023-01-04 DIAGNOSIS — I700 Atherosclerosis of aorta: Secondary | ICD-10-CM | POA: Diagnosis not present

## 2023-01-04 DIAGNOSIS — Z96653 Presence of artificial knee joint, bilateral: Secondary | ICD-10-CM | POA: Diagnosis not present

## 2023-01-04 DIAGNOSIS — J45909 Unspecified asthma, uncomplicated: Secondary | ICD-10-CM | POA: Diagnosis not present

## 2023-01-04 DIAGNOSIS — Z791 Long term (current) use of non-steroidal anti-inflammatories (NSAID): Secondary | ICD-10-CM | POA: Diagnosis not present

## 2023-01-04 DIAGNOSIS — Z7951 Long term (current) use of inhaled steroids: Secondary | ICD-10-CM | POA: Diagnosis not present

## 2023-01-04 DIAGNOSIS — H3530 Unspecified macular degeneration: Secondary | ICD-10-CM | POA: Diagnosis not present

## 2023-01-04 DIAGNOSIS — K219 Gastro-esophageal reflux disease without esophagitis: Secondary | ICD-10-CM | POA: Diagnosis not present

## 2023-01-04 DIAGNOSIS — Z96642 Presence of left artificial hip joint: Secondary | ICD-10-CM | POA: Diagnosis not present

## 2023-01-04 DIAGNOSIS — Z9181 History of falling: Secondary | ICD-10-CM | POA: Diagnosis not present

## 2023-01-04 DIAGNOSIS — I5032 Chronic diastolic (congestive) heart failure: Secondary | ICD-10-CM | POA: Diagnosis not present

## 2023-01-05 ENCOUNTER — Other Ambulatory Visit: Payer: Self-pay

## 2023-01-05 ENCOUNTER — Emergency Department (HOSPITAL_COMMUNITY): Payer: Medicare Other

## 2023-01-05 ENCOUNTER — Encounter (HOSPITAL_COMMUNITY): Payer: Self-pay | Admitting: Internal Medicine

## 2023-01-05 ENCOUNTER — Observation Stay (HOSPITAL_COMMUNITY)
Admission: EM | Admit: 2023-01-05 | Discharge: 2023-01-06 | Disposition: A | Discharge status: Skilled Nursing Facility | Payer: Medicare Other | Attending: Internal Medicine | Admitting: Internal Medicine

## 2023-01-05 DIAGNOSIS — R29810 Facial weakness: Secondary | ICD-10-CM

## 2023-01-05 DIAGNOSIS — I10 Essential (primary) hypertension: Secondary | ICD-10-CM | POA: Diagnosis present

## 2023-01-05 DIAGNOSIS — F0390 Unspecified dementia without behavioral disturbance: Secondary | ICD-10-CM | POA: Diagnosis not present

## 2023-01-05 DIAGNOSIS — R29818 Other symptoms and signs involving the nervous system: Secondary | ICD-10-CM | POA: Diagnosis not present

## 2023-01-05 DIAGNOSIS — G459 Transient cerebral ischemic attack, unspecified: Secondary | ICD-10-CM | POA: Diagnosis not present

## 2023-01-05 DIAGNOSIS — Z96642 Presence of left artificial hip joint: Secondary | ICD-10-CM | POA: Insufficient documentation

## 2023-01-05 DIAGNOSIS — Z79899 Other long term (current) drug therapy: Secondary | ICD-10-CM | POA: Insufficient documentation

## 2023-01-05 DIAGNOSIS — J4520 Mild intermittent asthma, uncomplicated: Secondary | ICD-10-CM | POA: Diagnosis not present

## 2023-01-05 DIAGNOSIS — E890 Postprocedural hypothyroidism: Secondary | ICD-10-CM | POA: Diagnosis present

## 2023-01-05 DIAGNOSIS — I110 Hypertensive heart disease with heart failure: Secondary | ICD-10-CM | POA: Diagnosis not present

## 2023-01-05 DIAGNOSIS — E039 Hypothyroidism, unspecified: Secondary | ICD-10-CM | POA: Diagnosis not present

## 2023-01-05 DIAGNOSIS — R5381 Other malaise: Secondary | ICD-10-CM | POA: Diagnosis not present

## 2023-01-05 DIAGNOSIS — Z96653 Presence of artificial knee joint, bilateral: Secondary | ICD-10-CM | POA: Diagnosis not present

## 2023-01-05 DIAGNOSIS — Z743 Need for continuous supervision: Secondary | ICD-10-CM | POA: Diagnosis not present

## 2023-01-05 DIAGNOSIS — I5032 Chronic diastolic (congestive) heart failure: Secondary | ICD-10-CM | POA: Diagnosis present

## 2023-01-05 LAB — BASIC METABOLIC PANEL
Anion gap: 11 (ref 5–15)
BUN: 24 mg/dL — ABNORMAL HIGH (ref 8–23)
CO2: 23 mmol/L (ref 22–32)
Calcium: 10.4 mg/dL — ABNORMAL HIGH (ref 8.9–10.3)
Chloride: 99 mmol/L (ref 98–111)
Creatinine, Ser: 1.15 mg/dL — ABNORMAL HIGH (ref 0.44–1.00)
GFR, Estimated: 45 mL/min — ABNORMAL LOW (ref >60)
Glucose, Bld: 132 mg/dL — ABNORMAL HIGH (ref 70–99)
Potassium: 5 mmol/L (ref 3.5–5.1)
Sodium: 133 mmol/L — ABNORMAL LOW (ref 135–145)

## 2023-01-05 LAB — I-STAT CHEM 8, ED
BUN: 25 mg/dL — ABNORMAL HIGH (ref 8–23)
Calcium, Ion: 1.29 mmol/L (ref 1.15–1.40)
Chloride: 102 mmol/L (ref 98–111)
Creatinine, Ser: 1.2 mg/dL — ABNORMAL HIGH (ref 0.44–1.00)
Glucose, Bld: 129 mg/dL — ABNORMAL HIGH (ref 70–99)
HCT: 38 % (ref 36.0–46.0)
Hemoglobin: 12.9 g/dL (ref 12.0–15.0)
Potassium: 5 mmol/L (ref 3.5–5.1)
Sodium: 133 mmol/L — ABNORMAL LOW (ref 135–145)
TCO2: 24 mmol/L (ref 22–32)

## 2023-01-05 LAB — CBG MONITORING, ED: Glucose-Capillary: 132 mg/dL — ABNORMAL HIGH (ref 70–99)

## 2023-01-05 LAB — PROTIME-INR
INR: 1 (ref 0.8–1.2)
Prothrombin Time: 13 seconds (ref 11.4–15.2)

## 2023-01-05 MED ORDER — CALCIUM CARBONATE 1250 (500 CA) MG PO TABS
1250.0000 mg | ORAL_TABLET | Freq: Every day | ORAL | Status: DC
  Administered 2023-01-06: 1250 mg via ORAL
  Filled 2023-01-05: qty 1

## 2023-01-05 MED ORDER — STROKE: EARLY STAGES OF RECOVERY BOOK: Freq: Once | Status: DC

## 2023-01-05 MED ORDER — DOXYCYCLINE HYCLATE 100 MG PO TABS
100.0000 mg | ORAL_TABLET | Freq: Two times a day (BID) | ORAL | Status: DC
  Administered 2023-01-05 – 2023-01-06 (×2): 100 mg via ORAL
  Filled 2023-01-05 (×2): qty 1

## 2023-01-05 MED ORDER — RISAQUAD PO CAPS
1.0000 | ORAL_CAPSULE | Freq: Three times a day (TID) | ORAL | Status: DC
  Administered 2023-01-05 – 2023-01-06 (×3): 1 via ORAL
  Filled 2023-01-05 (×3): qty 1

## 2023-01-05 MED ORDER — LOSARTAN POTASSIUM 50 MG PO TABS: 50.0000 mg | ORAL_TABLET | Freq: Every day | ORAL | Status: DC

## 2023-01-05 MED ORDER — LEVOTHYROXINE SODIUM 100 MCG PO TABS
100.0000 ug | ORAL_TABLET | Freq: Every day | ORAL | Status: DC
  Administered 2023-01-06: 100 ug via ORAL
  Filled 2023-01-05: qty 1

## 2023-01-05 MED ORDER — MONTELUKAST SODIUM 10 MG PO TABS
10.0000 mg | ORAL_TABLET | Freq: Every day | ORAL | Status: DC
  Administered 2023-01-05: 10 mg via ORAL
  Filled 2023-01-05: qty 1

## 2023-01-05 MED ORDER — VITAMIN D 25 MCG (1000 UNIT) PO TABS
1000.0000 [IU] | ORAL_TABLET | Freq: Every day | ORAL | Status: DC
  Administered 2023-01-06: 1000 [IU] via ORAL
  Filled 2023-01-05: qty 1

## 2023-01-05 MED ORDER — MELOXICAM 7.5 MG PO TABS
15.0000 mg | ORAL_TABLET | Freq: Every day | ORAL | Status: DC
  Administered 2023-01-06: 15 mg via ORAL
  Filled 2023-01-05: qty 2

## 2023-01-05 MED ORDER — FAMOTIDINE 20 MG PO TABS
20.0000 mg | ORAL_TABLET | Freq: Every day | ORAL | Status: DC
  Administered 2023-01-05: 20 mg via ORAL
  Filled 2023-01-05: qty 1

## 2023-01-05 MED ORDER — VITAMIN B-12 1000 MCG PO TABS
1000.0000 ug | ORAL_TABLET | Freq: Every day | ORAL | Status: DC
  Administered 2023-01-06: 1000 ug via ORAL
  Filled 2023-01-05: qty 1

## 2023-01-05 MED ORDER — STROKE: EARLY STAGES OF RECOVERY BOOK
Freq: Once | Status: AC
  Filled 2023-01-05: qty 1

## 2023-01-05 MED ORDER — LOSARTAN POTASSIUM 50 MG PO TABS
50.0000 mg | ORAL_TABLET | Freq: Every day | ORAL | Status: DC
  Administered 2023-01-05 – 2023-01-06 (×2): 50 mg via ORAL
  Filled 2023-01-05 (×2): qty 1

## 2023-01-05 MED ORDER — ROSUVASTATIN CALCIUM 5 MG PO TABS
10.0000 mg | ORAL_TABLET | Freq: Every day | ORAL | Status: DC
  Administered 2023-01-05: 10 mg via ORAL
  Filled 2023-01-05: qty 2

## 2023-01-05 MED ORDER — ACETAMINOPHEN 500 MG PO TABS
1000.0000 mg | ORAL_TABLET | Freq: Three times a day (TID) | ORAL | Status: DC
  Administered 2023-01-05 – 2023-01-06 (×3): 1000 mg via ORAL
  Filled 2023-01-05 (×3): qty 2

## 2023-01-05 MED ORDER — PREDNISONE 5 MG PO TABS
5.0000 mg | ORAL_TABLET | Freq: Every day | ORAL | Status: DC
  Administered 2023-01-06: 5 mg via ORAL
  Filled 2023-01-05: qty 1

## 2023-01-05 MED ORDER — ENOXAPARIN SODIUM 30 MG/0.3ML IJ SOSY
30.0000 mg | PREFILLED_SYRINGE | INTRAMUSCULAR | Status: DC
  Administered 2023-01-06: 30 mg via SUBCUTANEOUS
  Filled 2023-01-05: qty 0.3

## 2023-01-05 MED ORDER — SERTRALINE HCL 50 MG PO TABS
50.0000 mg | ORAL_TABLET | Freq: Every day | ORAL | Status: DC
  Administered 2023-01-06: 50 mg via ORAL
  Filled 2023-01-05: qty 1

## 2023-01-05 NOTE — ED Notes (Signed)
Pt wearing non-slip socks, fall risk armband, bed alarm placed.

## 2023-01-05 NOTE — Consult Note (Signed)
Neurology Consultation Reason for Consult: CODE STROKE  CC: right facial droop  History is obtained from:patient and chart  HPI: Lindsey Butler is a 89 y.o. female with past medical hx of hypertension, CHF, no blood thinner at home. EMS activated CODE STROKE.  Patient from ALF where she was LKW at 1230 and now complaining of facial droop. Patient seen normal at a birthday party at her facility and was found at 1300 by staff to have right facial droop. Patient is blind at baseline. No other deficits noted.  On ED exam, Patient is disoriented to month and year, oriented to place, age, birth date. Mild right facial droop present.  CT head negative.   LKW: 1230 tnk given?: no, mild symptoms IR? No, no LVO suspected due to mild symptoms Premorbid modified rankin scale: 3-4  ROS: A 14 point ROS was performed and is negative except as noted in the HPI.   Past Medical History:  Diagnosis Date   Actinic keratosis    Asthma    GERD (gastroesophageal reflux disease)    Hyperlipidemia    Hypothyroidism    Osteoarthritis     Family History  Problem Relation Age of Onset   Cancer Son    Liver cancer Brother     Social History:  reports that she has never smoked. She has never used smokeless tobacco. She reports that she does not drink alcohol and does not use drugs.  Exam: Current vital signs: BP (!) 156/81   Pulse 72   Temp 97.6 F (36.4 C) (Oral)   Ht 5\' 6"  (1.676 m)   Wt 60.4 kg   SpO2 100%   BMI 21.49 kg/m  Vital signs in last 24 hours: Temp:  [97.6 F (36.4 C)] 97.6 F (36.4 C) (05/30 1642) Pulse Rate:  [72] 72 (05/30 1642) BP: (156)/(81) 156/81 (05/30 1641) SpO2:  [100 %] 100 % (05/30 1642) Weight:  [60.4 kg] 60.4 kg (05/30 1600)   Physical Exam  Appears well-developed and well-nourished.   Neuro: Mental Status: Patient is awake, alert, oriented to person, place, age/birth date. Disoriented to month and year.  Patient is able to give a clear and coherent  history. No signs of aphasia or neglect Cranial Nerves: II: Blind at baseline.    III,IV, VI: EOMI   V: Facial sensation is symmetric to touch VII: Right slight facial droop VIII: hearing is intact to voice X: Uvula elevates symmetrically XI: Shoulder shrug is symmetric. XII: tongue is midline without atrophy or fasciculations.  Motor: Tone is normal. Bulk is normal. 5/5 strength was present in all four extremities.  Sensory: Sensation is symmetric to light touch  in the arms and legs. Cerebellar: FNF and HKS are intact bilaterally   I have reviewed labs in epic and the results pertinent to this consultation are:   I have reviewed the images obtained:  CT Head: Negative  Impression: Lindsey Butler is a 89 y.o. female with past medical hx of hypertension, CHF . No OAC at home. EMS activated CODE STROKE.  Patient from ALF where she was LKW at 1230. Patient was found at 1300 by staff to have facial droop. Patient is blind at baseline. No other deficits noted.  On ED exam, Patient is disoriented to month and year, oriented to place, age, birthdate. Mild right facial droop present.  CT head negative.   TIA versus Stroke   Recommendations: - Frequent Neuro checks per stroke unit protocol - MRI Brain stroke protocol - Vascular  imaging - MRA - TTE - Lipid panel - Statin - will be started if LDL>70 or otherwise medically indicated - A1C  - Antithrombotic - ASA 81mg /Plavix for 3 weeks, followed by ASA if MRI negative for stroke - DVT ppx - SCDs - Smoking cessation - will counsel patient - SBP goal - <220, PRN labetalol if HR>60 and PRN Hydralazine if HR<60 - Telemetry monitoring for arrhythmia - 72h - Swallow screen - will be performed prior to PO intake - Stroke education - will be given - PT/OT/SLP  ATTENDING NOTE: I reviewed above note and agree with the assessment and plan. Pt was seen and examined.   Pt admitted from nursing facility, per EMS, pt was LSW at the  birthday party around 12pm and then at 1pm, she was found to have right facial droop. EMS was called and she was sent to ED. On arrival, I did not appreciate too much of the right facial droop. Pt told me she fell in the facility and the staff was with her at that time. She denies any other deficit she has. At baseline, she mostly in wheelchair, legally blind bilaterally. Per EMS, pt was able to walk to EMS truck with shuffling gait. She has hx of HTN and HLD on BP meds and crestor. Pt not TNK candidate given nondisabling symptoms, not IR candidate given high mRS.   Continue further stroke work up  Frequent neuro checks Telemetry monitoring MRI brain  MRA head with carotid doppler Echocardiogram  UDS, fasting lipid panel and HgbA1C PT/OT/speech consult Permissive hypertension (only treat if BP > 220/120 unless a lower blood pressure is clinically necessary) for 24-48 hours post stroke onset GI and DVT prophylaxis  Bedside swallow screen Once pass swallow, recommend ASA 81 Stroke risk factor modification Discussed with Dr. Wilkie Aye ED physician signs or symptoms indicate the need.   For detailed assessment and plan, please refer to above/below as I have made changes wherever appropriate.   Marvel Plan, MD PhD Stroke Neurology 01/05/2023 5:56 PM

## 2023-01-05 NOTE — ED Triage Notes (Signed)
Pt BIB [facility] from assisted living facility as Code Stroke. LKW: 1230, staff noticed R facial droop and reports pt has been leaning to R side for 2days, pt states she's leaned to right her entire life. Pt states she fell today, EMS did not report this.  EMS VS: 138/82, 95% room air, cbg 177 Pt taking antibiotic for skin tear on arm and leg.

## 2023-01-05 NOTE — ED Notes (Signed)
ED TO INPATIENT HANDOFF REPORT  ED Nurse Name and Phone #:  Sherron Ales and Grover Canavan [phone]  S Name/Age/Gender Lindsey Butler 89 y.o. female Room/Bed: 019C/019C  Code Status   Code Status: Prior  Home/SNF/Other Skilled nursing facility Patient oriented to: self, place, and situation Is this baseline? Yes   Triage Complete: Triage complete  Chief Complaint TIA (transient ischemic attack) [G45.9]  Triage Note Pt BIB [facility] from assisted living facility as Code Stroke. LKW: 1230, staff noticed R facial droop and reports pt has been leaning to R side for 2days, pt states she's leaned to right her entire life. Pt states she fell today, EMS did not report this.  EMS VS: 138/82, 95% room air, cbg 177 Pt taking antibiotic for skin tear on arm and leg.    Allergies No Known Allergies  Level of Care/Admitting Diagnosis ED Disposition     ED Disposition  Admit   Condition  --   Comment  Hospital Area: [facility] [[id number]]  Level of Care: Telemetry Medical [104]  May place patient in observation at [facility] or [facility] if equivalent level of care is available:: No  Covid Evaluation: Asymptomatic - no recent exposure (last 10 days) testing not required  Diagnosis: TIA (transient ischemic attack) [604540]  Admitting Physician: Dorcas Carrow [[id number]]  Attending Physician: Dorcas Carrow [[id number]]          B Medical/Surgery History Past Medical History:  Diagnosis Date   Actinic keratosis    Asthma    GERD (gastroesophageal reflux disease)    Hyperlipidemia    Hypothyroidism    Osteoarthritis    Past Surgical History:  Procedure Laterality Date   CATARACT EXTRACTION     left and right   HIP ARTHROPLASTY Left 10/04/2019   Procedure: ARTHROPLASTY BIPOLAR HIP (HEMIARTHROPLASTY);  Surgeon: Vickki Hearing, MD;  Location: [facility];  Service: Orthopedics;  Laterality: Left;   THYROIDECTOMY     TONSILLECTOMY     TOTAL KNEE ARTHROPLASTY     right and  left knee   VAGINAL HYSTERECTOMY       A IV Location/Drains/Wounds Patient Lines/Drains/Airways Status     Active Line/Drains/Airways     Name Placement date Placement time Site Days   Peripheral IV 01/05/23 20 G Anterior;Right Forearm 01/05/23  1647  Forearm  less than 1   Wound / Incision (Open or Dehisced) 10/04/19 Arm Anterior;Left;Lower Skin Tear 10/04/19  1730  Arm  1189   Wound / Incision (Open or Dehisced) 10/04/19 Other (Comment) Arm Lower;Posterior;Right 10/04/19  1930  Arm  1189   Wound / Incision (Open or Dehisced) 03/02/21 (MASD) Moisture Associated Skin Damage Sacrum Circumferential Redness 03/02/21  2007  Sacrum  674            Intake/Output Last 24 hours No intake or output data in the 24 hours ending 01/05/23 1907  Labs/Imaging Results for orders placed or performed during the hospital encounter of 01/05/23 (from the past 48 hour(s))  CBG monitoring, ED     Status: Abnormal   Collection Time: 01/05/23  4:13 PM  Result Value Ref Range   Glucose-Capillary 132 (H) 70 - 99 mg/dL    Comment: Glucose reference range applies only to samples taken after fasting for at least 8 hours.  I-stat chem 8, ed     Status: Abnormal   Collection Time: 01/05/23  4:17 PM  Result Value Ref Range   Sodium 133 (L) 135 - 145 mmol/L   Potassium  5.0 3.5 - 5.1 mmol/L   Chloride 102 98 - 111 mmol/L   BUN 25 (H) 8 - 23 mg/dL   Creatinine, Ser 4.09 (H) 0.44 - 1.00 mg/dL   Glucose, Bld 811 (H) 70 - 99 mg/dL    Comment: Glucose reference range applies only to samples taken after fasting for at least 8 hours.   Calcium, Ion 1.29 1.15 - 1.40 mmol/L   TCO2 24 22 - 32 mmol/L   Hemoglobin 12.9 12.0 - 15.0 g/dL   HCT 91.4 78.2 - 95.6 %   CT HEAD CODE STROKE WO CONTRAST  Result Date: 01/05/2023 CLINICAL DATA:  Code stroke.  Neuro deficit, acute, stroke suspected EXAM: CT HEAD WITHOUT CONTRAST TECHNIQUE: Contiguous axial images were obtained from the base of the skull through the vertex  without intravenous contrast. RADIATION DOSE REDUCTION: This exam was performed according to the departmental dose-optimization program which includes automated exposure control, adjustment of the mA and/or kV according to patient size and/or use of iterative reconstruction technique. COMPARISON:  CT head Jan 01, 2023. FINDINGS: Brain: No evidence of acute large vascular territory infarction, hemorrhage, hydrocephalus, extra-axial collection or mass lesion/mass effect. Patchy white matter hypodensities, nonspecific but compatible with chronic microvascular ischemic disease. Vascular: No hyperdense vessel identified. Skull: No acute fracture. Sinuses/Orbits: Air-fluid levels in the sphenoid sinuses with additional scattered mild paranasal sinus mucosal thickening. No acute orbital findings. Other: No mastoid effusions. ASPECTS [facility] Stroke Program Early CT Score) total score (0-10 with 10 being normal): 10. IMPRESSION: 1. No evidence of acute large vascular territory infarct or acute hemorrhage. ASPECTS is 10. 2. Chronic microvascular ischemic disease. Code stroke imaging results were communicated on 01/05/2023 at 4:31 pm to provider Dr. Roda Shutters via secure text paging. Electronically Signed   By: Feliberto Harts M.D.   On: 01/05/2023 16:31    Pending Labs Unresulted Labs (From admission, onward)     Start     Ordered   01/06/23 0500  Lipid panel  (Labs)  Tomorrow morning,   R       Comments: Fasting    01/05/23 1622   01/05/23 1900  CBC with Differential/Platelet  Once,   STAT        01/05/23 1900   01/05/23 1849  Urinalysis, Routine w reflex microscopic -Urine, Clean Catch  Once,   URGENT       Question:  Specimen Source  Answer:  Urine, Clean Catch   01/05/23 1848   01/05/23 1847  Protime-INR  Once,   STAT        01/05/23 1846   01/05/23 1846  CBC with Differential  Once,   STAT        01/05/23 1845   01/05/23 1846  Basic metabolic panel  Once,   STAT        01/05/23 1845   01/05/23 1622   Hemoglobin A1c  (Labs)  Once,   URGENT       Comments: To assess prior glycemic control    01/05/23 1622            Vitals/Pain Today's Vitals   01/05/23 1638 01/05/23 1641 01/05/23 1642 01/05/23 1745  BP:  (!) 156/81  (!) 163/81  Pulse:   72 73  Resp:    (!) 22  Temp:   97.6 F (36.4 C)   TempSrc:   Oral   SpO2:   100% 100%  Weight:      Height: 5\' 6"  (1.676 m)     PainSc:  0-No pain       Isolation Precautions No active isolations  Medications Medications   stroke: early stages of recovery book (has no administration in time range)    Mobility walks with person assist     Focused Assessments Neuro Assessment Handoff:  Swallow screen pass? Yes    NIH Stroke Scale  Dizziness Present: No Headache Present: Yes Interval: Initial Level of Consciousness (1a.)   : Alert, keenly responsive LOC Questions (1b. )   : Answers one question correctly LOC Commands (1c. )   : Performs both tasks correctly Best Gaze (2. )  : Normal Visual (3. )  : Bilateral hemianopia (blind including cortical blindness) (visually impaired) Facial Palsy (4. )    : Minor paralysis Motor Arm, Left (5a. )   : No drift Motor Arm, Right (5b. ) : No drift Motor Leg, Left (6a. )  : No drift Motor Leg, Right (6b. ) : No drift Limb Ataxia (7. ): Absent Sensory (8. )  : Normal, no sensory loss Best Language (9. )  : No aphasia Dysarthria (10. ): Normal Extinction/Inattention (11.)   : No Abnormality Complete NIHSS TOTAL: 5 Last date known well: 01/05/23 Last time known well: 1230 Neuro Assessment: Exceptions to WDL Neuro Checks:   Initial (01/05/23 1630)  Has TPA been given? No If patient is a Neuro Trauma and patient is going to OR before floor call report to 4N Charge nurse: [phone] or [phone]   R Recommendations: See Admitting Provider Note  Report given to:   Additional Notes:  Patient blind, Disoriented to time (year), Q4 NIH

## 2023-01-05 NOTE — ED Notes (Signed)
Patient transported to MRI 

## 2023-01-05 NOTE — ED Notes (Signed)
Family updated as to patient's status.

## 2023-01-05 NOTE — H&P (Signed)
History and Physical    Lindsey Butler ZOX:[id number] DOB: December 04, 1928 DOA: 01/05/2023  PCP: Irven Baltimore, NP  Patient coming from: ALF  I have personally briefly reviewed patient's old medical records available.   Chief Complaint: Seen with facial droop and confused by care taker/ daughter in law at ALF. Keeps falling   HPI: Lindsey Butler is a 89 y.o. female with medical history significant of arthritis on chronic prednisone therapy, hypothyroidism, hypertension and hyperlipidemia, memory loss, frequent fall who lives in an assisted living facility her daughter-in-law also works there.  Patient is poor historian.  She is also blind.  Was recently visiting ER for a fall and skin tears.  Daughter-in-law was with her, they had a birthday party for some other resident but they noticed that her right side was droopy and also she was leaning more towards the right so they called EMS and asked them to bring her to [facility].  Patient recently has been having poor health and falling frequently, however staff thinks she was at about her baseline before the party at 1230. As above, patient tells me she is fine and feels okay.  Denies any difficulty swallowing.  Denies any chest pain, shortness of breath, focal weaknesses.  As per family, her mental status is at her baseline. ED Course: Arrived as a code stroke.  Last known normal 1230.  Mild symptoms.  Not a tPA candidate.  She has slight right facial droop but no focal neurological deficits.  Seen by neurology at the gate.  Getting MRI now.  Admitted for stroke/TIA workup.   Review of Systems: all systems are reviewed and pertinent positive as per HPI otherwise rest are negative.    Past Medical History:  Diagnosis Date   Actinic keratosis    Asthma    GERD (gastroesophageal reflux disease)    Hyperlipidemia    Hypothyroidism    Osteoarthritis     Past Surgical History:  Procedure Laterality Date   CATARACT EXTRACTION      left and right   HIP ARTHROPLASTY Left 10/04/2019   Procedure: ARTHROPLASTY BIPOLAR HIP (HEMIARTHROPLASTY);  Surgeon: Vickki Hearing, MD;  Location: [facility];  Service: Orthopedics;  Laterality: Left;   THYROIDECTOMY     TONSILLECTOMY     TOTAL KNEE ARTHROPLASTY     right and left knee   VAGINAL HYSTERECTOMY      Social history   reports that she has never smoked. She has never used smokeless tobacco. She reports that she does not drink alcohol and does not use drugs.  No Known Allergies  Family History  Problem Relation Age of Onset   Cancer Son    Liver cancer Brother      Prior to Admission medications   Medication Sig Start Date End Date Taking? Authorizing Provider  acetaminophen (TYLENOL) 500 MG tablet Take 2 tablets (1,000 mg total) by mouth every 8 (eight) hours. 09/09/22  Yes Vassie Loll, MD  calcium carbonate (OSCAL) 1500 (600 Ca) MG TABS tablet Take 1,500 mg by mouth daily.   Yes [provider]  cholecalciferol (VITAMIN D) 25 MCG (1000 UNIT) tablet Take 1,000 Units by mouth daily. 11/07/20  Yes [provider]  doxycycline (ADOXA) 100 MG tablet Take 100 mg by mouth 2 (two) times daily. 01/04/23 01/14/23 Yes [provider]  famotidine (PEPCID) 40 MG tablet Take 40 mg by mouth at bedtime.   Yes [provider]  lactobacillus acidophilus (BACID) TABS tablet Take 1 tablet  by mouth 3 (three) times daily.   Yes [provider]  levothyroxine (SYNTHROID) 100 MCG tablet Take 100 mcg by mouth daily. 08/19/22  Yes [provider]  losartan (COZAAR) 50 MG tablet Take 50 mg by mouth daily. 08/19/22  Yes [provider]  meloxicam (MOBIC) 15 MG tablet Take 15 mg by mouth daily.   Yes [provider]  montelukast (SINGULAIR) 10 MG tablet Take 1 tablet (10 mg total) by mouth at bedtime. 11/01/19  Yes Sharee Holster, NP  omeprazole (PRILOSEC) 40 MG capsule Take 40 mg by mouth daily.   Yes [provider]   predniSONE (DELTASONE) 5 MG tablet Take 5 mg by mouth daily. 08/19/22  Yes [provider]  rosuvastatin (CRESTOR) 10 MG tablet Take 10 mg by mouth at bedtime. 08/19/22  Yes [provider]  senna (SENOKOT) 8.6 MG TABS tablet Take 1 tablet (8.6 mg total) by mouth 2 (two) times daily. Hold for diarrhea. 09/09/22  Yes Vassie Loll, MD  sertraline (ZOLOFT) 50 MG tablet Take 50 mg by mouth daily. 08/19/22  Yes [provider]  vitamin B-12 (CYANOCOBALAMIN) 1000 MCG tablet Take 1,000 mcg by mouth daily. 02/24/21  Yes [provider]  budesonide (PULMICORT) 0.5 MG/2ML nebulizer solution Take 2 mLs (0.5 mg total) by nebulization 2 (two) times daily. Patient not taking: Reported on 12/02/2022 03/05/21   Shon Hale, MD  cephALEXin (KEFLEX) 250 MG capsule Take 250 mg by mouth 4 (four) times daily. Patient not taking: Reported on 01/05/2023 12/27/22   [provider]  furosemide (LASIX) 20 MG tablet Take 1 tablet (20 mg total) by mouth daily as needed for fluid or edema. Patient not taking: Reported on 12/02/2022 09/09/22   Vassie Loll, MD  methocarbamol (ROBAXIN) 500 MG tablet Take 1 tablet (500 mg total) by mouth every 8 (eight) hours as needed for muscle spasms. Patient not taking: Reported on 12/02/2022 09/09/22   Vassie Loll, MD  polyethylene glycol (MIRALAX / GLYCOLAX) 17 g packet Take 17 g by mouth daily as needed for mild constipation. Patient not taking: Reported on 12/02/2022 09/09/22   Vassie Loll, MD    Physical Exam: Vitals:   01/05/23 1700 01/05/23 1730 01/05/23 1745 01/05/23 1922  BP: 138/79 (!) 144/71 (!) 163/81 (!) 179/87  Pulse: 67 67 73 64  Resp: 17 (!) 23 (!) 22 (!) 24  Temp:      TempSrc:      SpO2: 100% 100% 100% 100%  Weight:      Height:        Constitutional: NAD, calm, comfortable, frail and debilitated.  Elderly female who is blind but communicates well. Vitals:   01/05/23 1700 01/05/23 1730 01/05/23 1745 01/05/23 1922  BP:  138/79 (!) 144/71 (!) 163/81 (!) 179/87  Pulse: 67 67 73 64  Resp: 17 (!) 23 (!) 22 (!) 24  Temp:      TempSrc:      SpO2: 100% 100% 100% 100%  Weight:      Height:       Eyes: PERRL, lids and conjunctivae normal.  Waving of fingers present from very near. ENMT: Mucous membranes are moist. Posterior pharynx clear of any exudate or lesions.Normal dentition.  Neck: normal, supple, no masses, no thyromegaly Respiratory: clear to auscultation bilaterally, no wheezing, no crackles. Normal respiratory effort. No accessory muscle use.  Cardiovascular: Regular rate and rhythm, no murmurs / rubs / gallops. No extremity edema. 2+ pedal pulses. No carotid bruits.  Multiple  bruises and skin tears. Abdomen: no tenderness, no masses palpated. No hepatosplenomegaly. Bowel sounds positive.  Musculoskeletal: no clubbing / cyanosis. No joint deformity upper and lower extremities. Good ROM, no contractures. Normal muscle tone.  Skin: no rashes, skin tears from fall.  She also has ecchymosis. Neurologic: Mild right facial droop present.  Sensation intact, DTR normal. Strength 5/5 in all 4 but overall weak. Psychiatric: Normal judgment and insight. Alert and oriented to herself and family.  Difficult to assess orientation questions.  Normal mood and pleasant to conversation.    Labs on Admission: I have personally reviewed following labs and imaging studies  CBC: Recent Labs  Lab 01/05/23 1617  HGB 12.9  HCT 38.0   Basic Metabolic Panel: Recent Labs  Lab 01/05/23 1617 01/05/23 1623  NA 133* 133*  K 5.0 5.0  CL 102 99  CO2  --  23  GLUCOSE 129* 132*  BUN 25* 24*  CREATININE 1.20* 1.15*  CALCIUM  --  10.4*   GFR: Estimated Creatinine Clearance: 29.2 mL/min (A) (by C-G formula based on SCr of 1.15 mg/dL (H)). Liver Function Tests: No results for input(s): "AST", "ALT", "ALKPHOS", "BILITOT", "PROT", "ALBUMIN" in the last 168 hours. No results for input(s): "LIPASE", "AMYLASE" in the last 168  hours. No results for input(s): "AMMONIA" in the last 168 hours. Coagulation Profile: Recent Labs  Lab 01/05/23 1623  INR 1.0   Cardiac Enzymes: No results for input(s): "CKTOTAL", "CKMB", "CKMBINDEX", "TROPONINI" in the last 168 hours. BNP (last 3 results) No results for input(s): "PROBNP" in the last 8760 hours. HbA1C: No results for input(s): "HGBA1C" in the last 72 hours. CBG: Recent Labs  Lab 01/05/23 1613  GLUCAP 132*   Lipid Profile: No results for input(s): "CHOL", "HDL", "LDLCALC", "TRIG", "CHOLHDL", "LDLDIRECT" in the last 72 hours. Thyroid Function Tests: No results for input(s): "TSH", "T4TOTAL", "FREET4", "T3FREE", "THYROIDAB" in the last 72 hours. Anemia Panel: No results for input(s): "VITAMINB12", "FOLATE", "FERRITIN", "TIBC", "IRON", "RETICCTPCT" in the last 72 hours. Urine analysis:    Component Value Date/Time   COLORURINE YELLOW 11/26/2022 0946   APPEARANCEUR CLEAR 11/26/2022 0946   LABSPEC 1.008 11/26/2022 0946   PHURINE 7.0 11/26/2022 0946   GLUCOSEU NEGATIVE 11/26/2022 0946   HGBUR NEGATIVE 11/26/2022 0946   BILIRUBINUR NEGATIVE 11/26/2022 0946   KETONESUR NEGATIVE 11/26/2022 0946   PROTEINUR NEGATIVE 11/26/2022 0946   NITRITE NEGATIVE 11/26/2022 0946   LEUKOCYTESUR MODERATE (A) 11/26/2022 0946    Radiological Exams on Admission: MR BRAIN WO CONTRAST  Result Date: 01/05/2023 CLINICAL DATA:  Neuro deficit, acute, stroke suspected. EXAM: MRI HEAD WITHOUT CONTRAST MRA HEAD WITHOUT CONTRAST TECHNIQUE: Multiplanar, multi-echo pulse sequences of the brain and surrounding structures were acquired without intravenous contrast. Angiographic images of the Circle of Willis were acquired using MRA technique without intravenous contrast. COMPARISON:  Head CT 01/05/2023. FINDINGS: MRI HEAD FINDINGS Brain: No acute infarct or hemorrhage. Moderate to severe chronic small-vessel disease no mass or midline shift. No hydrocephalus or extra-axial collection. No  abnormal susceptibility. Vascular: Normal flow voids. Skull and upper cervical spine: Normal marrow signal. Sinuses/Orbits: Trace fluid in the left sphenoid sinus. Other: None. MRA HEAD FINDINGS Anterior circulation: Intracranial ICAs are patent without stenosis or aneurysm. The proximal ACAs and MCAs are patent without stenosis or aneurysm. Distal branches are symmetric. Posterior circulation: Normal basilar artery. The SCAs, AICAs and PICAs are patent proximally. The PCAs are patent proximally without stenosis or aneurysm. Distal branches are symmetric. Anatomic variants: Dominant left posterior communicating  artery. IMPRESSION: 1. No acute intracranial abnormality. 2. No intracranial large vessel occlusion or high-grade stenosis. 3. Moderate to severe chronic small-vessel disease. Electronically Signed   By: Orvan Falconer M.D.   On: 01/05/2023 19:16   MR ANGIO HEAD WO CONTRAST  Result Date: 01/05/2023 CLINICAL DATA:  Neuro deficit, acute, stroke suspected. EXAM: MRI HEAD WITHOUT CONTRAST MRA HEAD WITHOUT CONTRAST TECHNIQUE: Multiplanar, multi-echo pulse sequences of the brain and surrounding structures were acquired without intravenous contrast. Angiographic images of the Circle of Willis were acquired using MRA technique without intravenous contrast. COMPARISON:  Head CT 01/05/2023. FINDINGS: MRI HEAD FINDINGS Brain: No acute infarct or hemorrhage. Moderate to severe chronic small-vessel disease no mass or midline shift. No hydrocephalus or extra-axial collection. No abnormal susceptibility. Vascular: Normal flow voids. Skull and upper cervical spine: Normal marrow signal. Sinuses/Orbits: Trace fluid in the left sphenoid sinus. Other: None. MRA HEAD FINDINGS Anterior circulation: Intracranial ICAs are patent without stenosis or aneurysm. The proximal ACAs and MCAs are patent without stenosis or aneurysm. Distal branches are symmetric. Posterior circulation: Normal basilar artery. The SCAs, AICAs and PICAs  are patent proximally. The PCAs are patent proximally without stenosis or aneurysm. Distal branches are symmetric. Anatomic variants: Dominant left posterior communicating artery. IMPRESSION: 1. No acute intracranial abnormality. 2. No intracranial large vessel occlusion or high-grade stenosis. 3. Moderate to severe chronic small-vessel disease. Electronically Signed   By: Orvan Falconer M.D.   On: 01/05/2023 19:16   CT HEAD CODE STROKE WO CONTRAST  Result Date: 01/05/2023 CLINICAL DATA:  Code stroke.  Neuro deficit, acute, stroke suspected EXAM: CT HEAD WITHOUT CONTRAST TECHNIQUE: Contiguous axial images were obtained from the base of the skull through the vertex without intravenous contrast. RADIATION DOSE REDUCTION: This exam was performed according to the departmental dose-optimization program which includes automated exposure control, adjustment of the mA and/or kV according to patient size and/or use of iterative reconstruction technique. COMPARISON:  CT head Jan 01, 2023. FINDINGS: Brain: No evidence of acute large vascular territory infarction, hemorrhage, hydrocephalus, extra-axial collection or mass lesion/mass effect. Patchy white matter hypodensities, nonspecific but compatible with chronic microvascular ischemic disease. Vascular: No hyperdense vessel identified. Skull: No acute fracture. Sinuses/Orbits: Air-fluid levels in the sphenoid sinuses with additional scattered mild paranasal sinus mucosal thickening. No acute orbital findings. Other: No mastoid effusions. ASPECTS [facility] Stroke Program Early CT Score) total score (0-10 with 10 being normal): 10. IMPRESSION: 1. No evidence of acute large vascular territory infarct or acute hemorrhage. ASPECTS is 10. 2. Chronic microvascular ischemic disease. Code stroke imaging results were communicated on 01/05/2023 at 4:31 pm to provider Dr. Roda Shutters via secure text paging. Electronically Signed   By: Feliberto Harts M.D.   On: 01/05/2023 16:31    EKG:  Independently reviewed. Sinus rhythm   Assessment/Plan Principal Problem:   TIA (transient ischemic attack) Active Problems:   Post-operative hypothyroidism   Essential hypertension   Chronic heart failure with preserved ejection fraction (HFpEF) (HCC)     Acute ischemic stroke/TIA: Admit to monitored unit because of severity of symptoms. Neurochecks and vital signs as per stroke protocol. Patient was not a TPA and vascular intervention candidate because of minimal symptoms Diet, check swallow evaluation and start on diet.  She will likely pass bedside swallow. Antiplatelets, none at home.  Will start on aspirin once swallow test is done. Statin, on Crestor at home.  FLP pending.  Continue Crestor. Blood pressure goals normotensive, resume losartan. Consultations, neurology, speech, PT OT MRI of the  brain and MRA of the head pending. 2D echocardiogram  2.  Chronic medical issues including Hypothyroidism, euthyroid.  Resume thyroxine. Chronic diastolic heart failure with preserved ejection fraction, euvolemic. Multiple skin tears and ulcerations, on maintenance doxycycline.  Continue.  Wound care. Mild intermittent asthma, currently without exacerbation.  Albuterol as needed. GERD, on Pepcid.  Continue. Osteoarthritis and chronic prednisone therapy, continue prednisone 5 mg daily that she takes at home.   DVT prophylaxis: Lovenox subcu Code Status: DNR, confirmed by son and daughter-in-law. Family Communication: Pincus Badder daughter-in-law, on the phone  Disposition Plan: Assisted living facility with PT OT Consults called: Seen by neurology Admission status: Observation, monitor bed.   Dorcas Carrow MD [facility] Pager [phone]

## 2023-01-05 NOTE — ED Provider Notes (Signed)
[place] EMERGENCY DEPARTMENT AT [facility] Provider Note   CSN: [id number] Arrival date & time: 01/05/23  1609     History  No chief complaint on file.   Lindsey Butler is a 89 y.o. female.  HPI   89 year old female presents emergency department as a code stroke.  Last known normal was 1300, at that time reported left-sided facial droop/weakness.  Patient is chronically blind.  Patient evaluated in the EMS best bridge with stroke neurology team at bedside.  No family at bedside, taken emergently to the CT scanner.  Home Medications Prior to Admission medications   Medication Sig Start Date End Date Taking? Authorizing Provider  acetaminophen (TYLENOL) 500 MG tablet Take 2 tablets (1,000 mg total) by mouth every 8 (eight) hours. 09/09/22   Vassie Loll, MD  budesonide (PULMICORT) 0.5 MG/2ML nebulizer solution Take 2 mLs (0.5 mg total) by nebulization 2 (two) times daily. Patient not taking: Reported on 12/02/2022 03/05/21   Shon Hale, MD  calcium carbonate (OSCAL) 1500 (600 Ca) MG TABS tablet Take 1,500 mg by mouth daily.    [provider]  cholecalciferol (VITAMIN D) 25 MCG (1000 UNIT) tablet Take 1,000 Units by mouth daily. 11/07/20   [provider]  furosemide (LASIX) 20 MG tablet Take 1 tablet (20 mg total) by mouth daily as needed for fluid or edema. Patient not taking: Reported on 12/02/2022 09/09/22   Vassie Loll, MD  levothyroxine (SYNTHROID) 100 MCG tablet Take 100 mcg by mouth daily. 08/19/22   [provider]  losartan (COZAAR) 50 MG tablet Take 50 mg by mouth daily. 08/19/22   [provider]  meloxicam (MOBIC) 15 MG tablet Take 15 mg by mouth daily.    [provider]  methocarbamol (ROBAXIN) 500 MG tablet Take 1 tablet (500 mg total) by mouth every 8 (eight) hours as needed for muscle spasms. Patient not taking: Reported on 12/02/2022 09/09/22   Vassie Loll, MD  montelukast (SINGULAIR) 10 MG tablet Take 1  tablet (10 mg total) by mouth at bedtime. 11/01/19   Sharee Holster, NP  omeprazole (PRILOSEC) 40 MG capsule Take 40 mg by mouth daily.    [provider]  polyethylene glycol (MIRALAX / GLYCOLAX) 17 g packet Take 17 g by mouth daily as needed for mild constipation. Patient not taking: Reported on 12/02/2022 09/09/22   Vassie Loll, MD  predniSONE (DELTASONE) 5 MG tablet Take 5 mg by mouth daily. 08/19/22   [provider]  rosuvastatin (CRESTOR) 10 MG tablet Take 10 mg by mouth at bedtime. 08/19/22   [provider]  senna (SENOKOT) 8.6 MG TABS tablet Take 1 tablet (8.6 mg total) by mouth 2 (two) times daily. Hold for diarrhea. 09/09/22   Vassie Loll, MD  sertraline (ZOLOFT) 50 MG tablet Take 50 mg by mouth daily. 08/19/22   [provider]  vitamin B-12 (CYANOCOBALAMIN) 1000 MCG tablet Take 1,000 mcg by mouth daily. 02/24/21   [provider]      Allergies    Patient has no known allergies.    Review of Systems   Review of Systems  Unable to perform ROS: Acuity of condition    Physical Exam Updated Vital Signs There were no vitals taken for this visit. Physical Exam Vitals and nursing note reviewed.  Constitutional:      General: She is not in acute distress.    Appearance: Normal appearance.  HENT:     Head: Normocephalic.  Mouth/Throat:     Mouth: Mucous membranes are moist.  Eyes:     Comments: Chronically blind  Cardiovascular:     Rate and Rhythm: Normal rate.  Pulmonary:     Effort: Pulmonary effort is normal. No respiratory distress.     Comments: Protecting airway Abdominal:     Palpations: Abdomen is soft.     Tenderness: There is no abdominal tenderness.  Skin:    General: Skin is warm.  Neurological:     Mental Status: She is alert.     Comments: Right-sided facial droop noted  Psychiatric:        Mood and Affect: Mood normal.     ED Results / Procedures / Treatments   Labs (all labs ordered are listed,  but only abnormal results are displayed) Labs Reviewed  CBG MONITORING, ED - Abnormal; Notable for the following components:      Result Value   Glucose-Capillary 132 (*)    All other components within normal limits    EKG None  Radiology No results found.  Procedures .Critical Care  Performed by: Rozelle Logan, DO Authorized by: Rozelle Logan, DO   Critical care provider statement:    Critical care time (minutes):  30   Critical care time was exclusive of:  Separately billable procedures and treating other patients   Critical care was necessary to treat or prevent imminent or life-threatening deterioration of the following conditions:  CNS failure or compromise   Critical care was time spent personally by me on the following activities:  Development of treatment plan with patient or surrogate, discussions with consultants, evaluation of patient's response to treatment, examination of patient, ordering and review of laboratory studies, ordering and review of radiographic studies, ordering and performing treatments and interventions, pulse oximetry, re-evaluation of patient's condition and review of old charts   I assumed direction of critical care for this patient from another provider in my specialty: no     Care discussed with: admitting provider       Medications Ordered in ED Medications - No data to display  ED Course/ Medical Decision Making/ A&P                             Medical Decision Making Amount and/or Complexity of Data Reviewed Labs: ordered.  Risk Decision regarding hospitalization.   89 year old female presented to the emergency department as a code stroke.  Evaluated with stroke neurology team at bedside.  Noted to have right-sided facial droop, last known normal reported to be 1300.  Patient is chronically blind but otherwise is moving all 4 extremities, slightly weak in the right upper extremity.  Initial head CT shows no acute finding.  She  is not a TNK or IR candidate.  No clear etiology of her symptoms.  Possible fall at the nursing home reported now as well.  Spoke with stroke neurologist, Dr. Roda Shutters.  He recommends inpatient hospitalist admission with further evaluation including MRI.  Patients evaluation and results requires admission for further treatment and care.  Spoke with hospitalist, reviewed patient's ED course and they accept admission.  Patient agrees with admission plan, offers no new complaints and is stable/unchanged at time of admit.        Final Clinical Impression(s) / ED Diagnoses Final diagnoses:  None    Rx / DC Orders ED Discharge Orders     None  Rozelle Logan, DO 01/05/23 2131

## 2023-01-05 NOTE — Code Documentation (Signed)
Stroke Response Nurse Documentation Code Documentation  Lindsey Butler is a 89 y.o. female arriving to [facility]  via [facility] on 5/30  with past medical hx of hypertension, CHF. On No antithrombotic. Code stroke was activated by EMS.   Patient from ALF where she was LKW at 1230 and now complaining of facial droop. Patient seen normal at a birthday party at her facility and was found at 1300 by staff to have facial droop. Patient is blind at baseline. No other deficits noted.    Stroke team at the bedside on patient arrival. Labs drawn and patient cleared for CT by Dr. Rosalia Hammers. Patient to CT with team. NIHSS 5, see documentation for details and code stroke times. Patient with disoriented, bilateral hemianopia, and left facial droop on exam. The following imaging was completed:  CT Head. Patient is not a candidate for IV Thrombolytic due to mild symptoms, too mild to treat. Patient is not not a candidate for IR due to no LVO suspected.   Care Plan: q30 NIHSS and vitals until 1700.   Bedside handoff with ED RN Charlsie Quest  Stroke Response RN

## 2023-01-06 ENCOUNTER — Observation Stay (HOSPITAL_BASED_OUTPATIENT_CLINIC_OR_DEPARTMENT_OTHER): Payer: Medicare Other

## 2023-01-06 DIAGNOSIS — I10 Essential (primary) hypertension: Secondary | ICD-10-CM | POA: Diagnosis not present

## 2023-01-06 DIAGNOSIS — G459 Transient cerebral ischemic attack, unspecified: Secondary | ICD-10-CM | POA: Diagnosis not present

## 2023-01-06 DIAGNOSIS — Z743 Need for continuous supervision: Secondary | ICD-10-CM | POA: Diagnosis not present

## 2023-01-06 DIAGNOSIS — I5032 Chronic diastolic (congestive) heart failure: Secondary | ICD-10-CM

## 2023-01-06 DIAGNOSIS — E890 Postprocedural hypothyroidism: Secondary | ICD-10-CM | POA: Diagnosis not present

## 2023-01-06 DIAGNOSIS — R404 Transient alteration of awareness: Secondary | ICD-10-CM | POA: Diagnosis not present

## 2023-01-06 DIAGNOSIS — J4540 Moderate persistent asthma, uncomplicated: Secondary | ICD-10-CM | POA: Diagnosis not present

## 2023-01-06 DIAGNOSIS — E782 Mixed hyperlipidemia: Secondary | ICD-10-CM | POA: Diagnosis not present

## 2023-01-06 DIAGNOSIS — Z7401 Bed confinement status: Secondary | ICD-10-CM | POA: Diagnosis not present

## 2023-01-06 LAB — ECHOCARDIOGRAM COMPLETE
AR max vel: 1.62 cm2
AV Area VTI: 1.58 cm2
AV Area mean vel: 1.63 cm2
AV Mean grad: 8.5 mmHg
AV Peak grad: 14.1 mmHg
Ao pk vel: 1.88 m/s
Area-P 1/2: 2.88 cm2
Calc EF: 63.6 %
Height: 66 in
MV VTI: 1.78 cm2
S' Lateral: 2.3 cm
Single Plane A2C EF: 63.2 %
Single Plane A4C EF: 64.4 %
Weight: 2038.81 oz

## 2023-01-06 LAB — CBC WITH DIFFERENTIAL/PLATELET
Abs Immature Granulocytes: 0.04 10*3/uL (ref 0.00–0.07)
Basophils Absolute: 0.1 10*3/uL (ref 0.0–0.1)
Basophils Relative: 1 %
Eosinophils Absolute: 0.1 10*3/uL (ref 0.0–0.5)
Eosinophils Relative: 2 %
HCT: 39.4 % (ref 36.0–46.0)
Hemoglobin: 12.9 g/dL (ref 12.0–15.0)
Immature Granulocytes: 1 %
Lymphocytes Relative: 19 %
Lymphs Abs: 1.7 10*3/uL (ref 0.7–4.0)
MCH: 30.7 pg (ref 26.0–34.0)
MCHC: 32.7 g/dL (ref 30.0–36.0)
MCV: 93.8 fL (ref 80.0–100.0)
Monocytes Absolute: 1 10*3/uL (ref 0.1–1.0)
Monocytes Relative: 11 %
Neutro Abs: 5.9 10*3/uL (ref 1.7–7.7)
Neutrophils Relative %: 66 %
Platelets: 374 10*3/uL (ref 150–400)
RBC: 4.2 MIL/uL (ref 3.87–5.11)
RDW: 14.6 % (ref 11.5–15.5)
WBC: 8.8 10*3/uL (ref 4.0–10.5)
nRBC: 0 % (ref 0.0–0.2)

## 2023-01-06 LAB — LIPID PANEL
Cholesterol: 206 mg/dL — ABNORMAL HIGH (ref 0–200)
HDL: 114 mg/dL (ref >40)
LDL Cholesterol: 70 mg/dL (ref 0–99)
Total CHOL/HDL Ratio: 1.8 RATIO
Triglycerides: 112 mg/dL (ref <150)
VLDL: 22 mg/dL (ref 0–40)

## 2023-01-06 LAB — URINALYSIS, ROUTINE W REFLEX MICROSCOPIC
Bilirubin Urine: NEGATIVE
Glucose, UA: NEGATIVE mg/dL
Hgb urine dipstick: NEGATIVE
Ketones, ur: NEGATIVE mg/dL
Leukocytes,Ua: NEGATIVE
Nitrite: NEGATIVE
Protein, ur: NEGATIVE mg/dL
Specific Gravity, Urine: 1.012 (ref 1.005–1.030)
pH: 6 (ref 5.0–8.0)

## 2023-01-06 LAB — HEMOGLOBIN A1C
Hgb A1c MFr Bld: 5.4 % (ref 4.8–5.6)
Mean Plasma Glucose: 108 mg/dL

## 2023-01-06 MED ORDER — AMLODIPINE BESYLATE 5 MG PO TABS: 5.0000 mg | ORAL_TABLET | Freq: Every day | ORAL | 1 refills | Status: DC

## 2023-01-06 MED ORDER — ALBUTEROL SULFATE HFA 108 (90 BASE) MCG/ACT IN AERS: 2.0000 | INHALATION_SPRAY | Freq: Four times a day (QID) | RESPIRATORY_TRACT | 0 refills | Status: DC | PRN

## 2023-01-06 MED ORDER — ASPIRIN 81 MG PO CHEW: 81.0000 mg | CHEWABLE_TABLET | Freq: Every day | ORAL | 11 refills | Status: DC

## 2023-01-06 MED ORDER — ASPIRIN 81 MG PO CHEW
81.0000 mg | CHEWABLE_TABLET | Freq: Every day | ORAL | Status: DC
  Administered 2023-01-06: 81 mg via ORAL
  Filled 2023-01-06: qty 1

## 2023-01-06 MED ORDER — AMLODIPINE BESYLATE 5 MG PO TABS
5.0000 mg | ORAL_TABLET | Freq: Every day | ORAL | Status: DC
  Administered 2023-01-06: 5 mg via ORAL
  Filled 2023-01-06: qty 1

## 2023-01-06 NOTE — Evaluation (Signed)
Occupational Therapy Evaluation Patient Details Name: Lindsey Butler MRN: [id number] DOB: 1929/01/17 Today's Date: 01/06/2023   History of Present Illness Pt is 89 year old presented to [facility] on  01/05/23 for rt facial droop. MRI negative/ Pt with frequent falls at ALF.  PMH - arthritis, THR after hip fx, bil TKR.   Clinical Impression   Pt admitted with the above diagnosis. Pt currently with functional limitations due to the deficits listed below (see OT Problem List). Prior to admit, was living in ALF and receiving assistance for BADL tasks. Pt at high risk for falls due to severe posterior lean and increased difficulty correctly standing posture. Pt presents with visual impairment and HOH which creates a safety concern as well and increases her risk of falls.  Pt will benefit from acute skilled OT to increase their safety and independence with ADL and functional mobility for ADL to facilitate discharge. Recommend 24/7 supervision and assist for all BADL tasks including functional transfers. Post acute OT services recommended at discharge. Family has voiced that they would like her to return to ALF at discharge. OT will continue to follow patient acutely.        Recommendations for follow up therapy are one component of a multi-disciplinary discharge planning process, led by the attending physician.  Recommendations may be updated based on patient status, additional functional criteria and insurance authorization.   Assistance Recommended at Discharge Frequent or constant Supervision/Assistance  Patient can return home with the following A lot of help with walking and/or transfers;A lot of help with bathing/dressing/bathroom;Assist for transportation;Help with stairs or ramp for entrance    Functional Status Assessment  Patient has had a recent decline in their functional status and demonstrates the ability to make significant improvements in function in a reasonable and predictable amount of  time.  Equipment Recommendations  None recommended by OT    Recommendations for Other Services PT consult     Precautions / Restrictions Precautions Precautions: Fall Precaution Comments: HOH, severe visual impairment, posterior lean Restrictions Weight Bearing Restrictions: No      Mobility Bed Mobility Overal bed mobility: Needs Assistance Bed Mobility: Supine to Sit     Supine to sit: Max assist, HOB elevated     General bed mobility comments: assist to bring BLE towards EOB and trunk off HOB. Bed pad used to assist. Max VC provided to shift weight forward and lean forward to place weight into feet. Patient Response: Cooperative  Transfers Overall transfer level: Needs assistance Equipment used: Rolling walker (2 wheels), 1 person hand held assist Transfers: Sit to/from Stand, Bed to chair/wheelchair/BSC Sit to Stand: Mod assist, From elevated surface     Step pivot transfers: Mod assist     General transfer comment: Utilized RW to complete sit to stand with heavy posterior lean. Unable to carry over education to weight shift forward and bring weight into toes. Utilized 1 person face to face transfer technique to complete step transfer towards recliner.      Balance Overall balance assessment: History of Falls, Needs assistance Sitting-balance support: Bilateral upper extremity supported, Feet supported Sitting balance-Leahy Scale: Poor Sitting balance - Comments: sitting EOB Postural control: Posterior lean Standing balance support: Bilateral upper extremity supported, During functional activity, Reliant on assistive device for balance Standing balance-Leahy Scale: Zero Standing balance comment: reliant on external support for balance       ADL either performed or assessed with clinical judgement   ADL Overall ADL's : Needs assistance/impaired Eating/Feeding: Maximal assistance;Sitting  Eating/Feeding Details (indicate cue type and reason): due to  vision Grooming: Wash/dry hands;Wash/dry face;Oral care;Minimal assistance;Bed level   Upper Body Bathing: Maximal assistance;Bed level   Lower Body Bathing: Total assistance;Bed level   Upper Body Dressing : Maximal assistance;Bed level   Lower Body Dressing: Total assistance;Bed level   Toilet Transfer: Minimal assistance;Stand-pivot Toilet Transfer Details (indicate cue type and reason): 1 person face to face transfer technique Toileting- Clothing Manipulation and Hygiene: Total assistance;Bed level         Vision Baseline Vision/History: 1 Wears glasses (Glasses not with patient. She reports that she looses them alot.) Ability to See in Adequate Light: 3 Highly impaired Patient Visual Report: No change from baseline Additional Comments: Pt provided with modified soft touch call light with orange tape placed in an X pattern. Pt unable to see orange tape although can identify call light as object.            Pertinent Vitals/Pain Pain Assessment Pain Assessment: No/denies pain     Hand Dominance Right   Extremity/Trunk Assessment Upper Extremity Assessment Upper Extremity Assessment: Generalized weakness;RUE deficits/detail RUE Deficits / Details: Right shoulder A/ROM very limited. Only able to achieve shoulder flexion to ~25% range. Pt does not know reason.           Communication Communication Communication: HOH   Cognition Arousal/Alertness: Awake/alert Behavior During Therapy: WFL for tasks assessed/performed Overall Cognitive Status: No family/caregiver present to determine baseline cognitive functioning            General Comments  VSS on RA            Home Living Family/patient expects to be discharged to:: Assisted living     Home Equipment: Agricultural consultant (2 wheels);Wheelchair - manual;BSC/3in1;Grab bars - tub/shower;Grab bars - toilet;Shower seat   Additional Comments: per patient report.      Prior Functioning/Environment Prior Level of  Function : Needs assist       Physical Assist : Mobility (physical);ADLs (physical) Mobility (physical): Gait;Transfers   Mobility Comments: Pt states she uses the W/C to go to/from the bathroom at night; otherwise walks with RW. History of frequent falls. Pt also reports that she isn't supposed to walk alone but she will. ADLs Comments: Pt states that she needs assist with buttons, shoes, and socks. I predict she receives assistance for more.        OT Problem List: Decreased strength;Decreased coordination;Decreased activity tolerance;Decreased safety awareness;Impaired balance (sitting and/or standing);Decreased knowledge of use of DME or AE;Impaired vision/perception      OT Treatment/Interventions: Self-care/ADL training;Therapeutic exercise;Therapeutic activities;Visual/perceptual remediation/compensation;Energy conservation;DME and/or AE instruction;Patient/family education;Balance training    OT Goals(Current goals can be found in the care plan section) Acute Rehab OT Goals Patient Stated Goal: none stated OT Goal Formulation: Patient unable to participate in goal setting Time For Goal Achievement:  Potential to Achieve Goals: Good  OT Frequency: Min 2X/week       AM-PAC OT "6 Clicks" Daily Activity     Outcome Measure Help from another person eating meals?: A Lot Help from another person taking care of personal grooming?: A Lot Help from another person toileting, which includes using toliet, bedpan, or urinal?: Total Help from another person bathing (including washing, rinsing, drying)?: Total Help from another person to put on and taking off regular upper body clothing?: A Lot Help from another person to put on and taking off regular lower body clothing?: Total 6 Click Score: 9   End of Session Equipment Utilized  During Treatment: Gait belt;Rolling walker (2 wheels) Nurse Communication: Mobility status;Other (comment) (communication impairment and visual  impairment)  Activity Tolerance: Patient tolerated treatment well Patient left: in chair;with call bell/phone within reach;with chair alarm set  OT Visit Diagnosis: History of falling (Z91.81);Muscle weakness (generalized) (M62.81)                Time: 9604-5409 OT Time Calculation (min): 51 min Charges:  OT General Charges $OT Visit: 1 Visit OT Evaluation $OT Eval High Complexity: 1 High OT Treatments $Self Care/Home Management : 8-22 mins $Therapeutic Activity: 23-37 mins  Limmie Patricia, OTR/L,CBIS  Supplemental OT - [facility] and [facility] Secure Chat Preferred    Darivs Lunden, Charisse March 01/06/2023, 11:16 AM

## 2023-01-06 NOTE — Plan of Care (Signed)

## 2023-01-06 NOTE — Progress Notes (Signed)
STROKE TEAM PROGRESS NOTE   SUBJECTIVE (INTERVAL HISTORY) Her RNs are at the bedside.  Overall her condition is stable. Pt is sitting on the bedside commode. MRI done showed no stroke.    OBJECTIVE Temp:  [97.6 F (36.4 C)-97.9 F (36.6 C)] 97.9 F (36.6 C) (05/31 0800) Pulse Rate:  [62-78] 67 (05/31 0320) Cardiac Rhythm: Sinus tachycardia (05/31 1235) Resp:  [17-24] 20 (05/31 1313) BP: (129-203)/(71-105) 183/105 (05/31 0800) SpO2:  [96 %-100 %] 97 % (05/31 0320) Weight:  [57.8 kg-60.4 kg] 57.8 kg (05/31 0500)  Recent Labs  Lab 01/05/23 1613  GLUCAP 132*   Recent Labs  Lab 01/05/23 1617 01/05/23 1623  NA 133* 133*  K 5.0 5.0  CL 102 99  CO2  --  23  GLUCOSE 129* 132*  BUN 25* 24*  CREATININE 1.20* 1.15*  CALCIUM  --  10.4*   No results for input(s): "AST", "ALT", "ALKPHOS", "BILITOT", "PROT", "ALBUMIN" in the last 168 hours. Recent Labs  Lab 01/05/23 1617 01/06/23 0646  WBC  --  8.8  NEUTROABS  --  5.9  HGB 12.9 12.9  HCT 38.0 39.4  MCV  --  93.8  PLT  --  374   No results for input(s): "CKTOTAL", "CKMB", "CKMBINDEX", "TROPONINI" in the last 168 hours. Recent Labs    01/05/23 1623  LABPROT 13.0  INR 1.0   No results for input(s): "COLORURINE", "LABSPEC", "PHURINE", "GLUCOSEU", "HGBUR", "BILIRUBINUR", "KETONESUR", "PROTEINUR", "UROBILINOGEN", "NITRITE", "LEUKOCYTESUR" in the last 72 hours.  Invalid input(s): "APPERANCEUR"     Component Value Date/Time   CHOL 206 (H) 01/06/2023 0646   TRIG 112 01/06/2023 0646   HDL 114 01/06/2023 0646   CHOLHDL 1.8 01/06/2023 0646   VLDL 22 01/06/2023 0646   LDLCALC 70 01/06/2023 0646   Lab Results  Component Value Date   HGBA1C 5.4 01/05/2023   No results found for: "LABOPIA", "COCAINSCRNUR", "LABBENZ", "AMPHETMU", "THCU", "LABBARB"  No results for input(s): "ETH" in the last 168 hours.  I have personally reviewed the radiological images below and agree with the radiology interpretations.  VAS US  CAROTID  Result Date: 01/06/2023 Carotid Arterial Duplex Study Patient Name:  ADDIS BOBIK  Date of Exam:   01/06/2023 Medical Rec #: [id number]        Accession #:    [id number] Date of Birth: 05/20/1929        Patient Gender: F Patient Age:   89 years Exam Location:  [facility] Procedure:      VAS US CAROTID Referring Phys: Jeoffrey Massed --------------------------------------------------------------------------------  Indications:       TIA and facial droop. Risk Factors:      Hypertension, hyperlipidemia. Limitations        Today's exam was limited due to patient movement. Comparison Study:  No previous study. Performing Technologist: McKayla Maag RVT, [place]  Examination Guidelines: A complete evaluation includes B-mode imaging, spectral Doppler, color Doppler, and power Doppler as needed of all accessible portions of each vessel. Bilateral testing is considered an integral part of a complete examination. Limited examinations for reoccurring indications may be performed as noted.  Right Carotid Findings: +---------+--------+-------+--------+---------------------------------+--------+          PSV cm/sEDV    StenosisPlaque Description               Comments                  cm/s                                                     +---------+--------+-------+--------+---------------------------------+--------+  CCA Prox 59      6                                                        +---------+--------+-------+--------+---------------------------------+--------+ CCA      48      6              irregular, heterogenous and               Distal                          calcific                                  +---------+--------+-------+--------+---------------------------------+--------+ ICA Prox 88      9      1-39%   heterogenous, irregular and                                               calcific                                   +---------+--------+-------+--------+---------------------------------+--------+ ICA      65      17                                                       Distal                                                                    +---------+--------+-------+--------+---------------------------------+--------+ ECA      108     13                                                       +---------+--------+-------+--------+---------------------------------+--------+ +----------+--------+-------+----------------+-------------------+           PSV cm/sEDV cmsDescribe        Arm Pressure (mmHG) +----------+--------+-------+----------------+-------------------+ [id number]             Multiphasic, WNL                    +----------+--------+-------+----------------+-------------------+ +---------+--------+--+--------+-+---------+ VertebralPSV cm/s39EDV cm/s4Antegrade +---------+--------+--+--------+-+---------+  Left Carotid Findings: +----------+-------+-------+--------+---------------------------------+--------+           PSV    EDV    StenosisPlaque Description               Comments           cm/s   cm/s                                                     +----------+-------+-------+--------+---------------------------------+--------+  CCA Prox  45     10                                                       +----------+-------+-------+--------+---------------------------------+--------+ CCA Distal38     11             irregular, heterogenous and                                               calcific                                  +----------+-------+-------+--------+---------------------------------+--------+ ICA Prox  35     9      1-39%   irregular, heterogenous and                                               calcific                                   +----------+-------+-------+--------+---------------------------------+--------+ ICA Distal55     15                                                       +----------+-------+-------+--------+---------------------------------+--------+ ECA       33     0                                                        +----------+-------+-------+--------+---------------------------------+--------+ +----------+--------+--------+----------------+-------------------+           PSV cm/sEDV cm/sDescribe        Arm Pressure (mmHG) +----------+--------+--------+----------------+-------------------+ [id number]             Multiphasic, WNL                    +----------+--------+--------+----------------+-------------------+ +---------+--------+--+--------+-+---------+ VertebralPSV cm/s29EDV cm/s9Antegrade +---------+--------+--+--------+-+---------+   Summary: Right Carotid: Velocities in the right ICA are consistent with a 1-39% stenosis. Left Carotid: Velocities in the left ICA are consistent with a 1-39% stenosis. Vertebrals:  Bilateral vertebral arteries demonstrate antegrade flow. Subclavians: Normal flow hemodynamics were seen in bilateral subclavian              arteries. *See table(s) above for measurements and observations.  Electronically signed by Sherald Hess MD on 01/06/2023 at 2:52:20 PM.    Final    MR BRAIN WO CONTRAST  Result Date: 01/05/2023 CLINICAL DATA:  Neuro deficit, acute, stroke suspected. EXAM: MRI HEAD WITHOUT CONTRAST MRA HEAD WITHOUT CONTRAST TECHNIQUE: Multiplanar, multi-echo pulse sequences of the brain and surrounding structures were acquired without intravenous contrast. Angiographic images of the Circle of Willis were  acquired using MRA technique without intravenous contrast. COMPARISON:  Head CT 01/05/2023. FINDINGS: MRI HEAD FINDINGS Brain: No acute infarct or hemorrhage. Moderate to severe chronic small-vessel disease no mass or midline shift. No  hydrocephalus or extra-axial collection. No abnormal susceptibility. Vascular: Normal flow voids. Skull and upper cervical spine: Normal marrow signal. Sinuses/Orbits: Trace fluid in the left sphenoid sinus. Other: None. MRA HEAD FINDINGS Anterior circulation: Intracranial ICAs are patent without stenosis or aneurysm. The proximal ACAs and MCAs are patent without stenosis or aneurysm. Distal branches are symmetric. Posterior circulation: Normal basilar artery. The SCAs, AICAs and PICAs are patent proximally. The PCAs are patent proximally without stenosis or aneurysm. Distal branches are symmetric. Anatomic variants: Dominant left posterior communicating artery. IMPRESSION: 1. No acute intracranial abnormality. 2. No intracranial large vessel occlusion or high-grade stenosis. 3. Moderate to severe chronic small-vessel disease. Electronically Signed   By: Orvan Falconer M.D.   On: 01/05/2023 19:16   MR ANGIO HEAD WO CONTRAST  Result Date: 01/05/2023 CLINICAL DATA:  Neuro deficit, acute, stroke suspected. EXAM: MRI HEAD WITHOUT CONTRAST MRA HEAD WITHOUT CONTRAST TECHNIQUE: Multiplanar, multi-echo pulse sequences of the brain and surrounding structures were acquired without intravenous contrast. Angiographic images of the Circle of Willis were acquired using MRA technique without intravenous contrast. COMPARISON:  Head CT 01/05/2023. FINDINGS: MRI HEAD FINDINGS Brain: No acute infarct or hemorrhage. Moderate to severe chronic small-vessel disease no mass or midline shift. No hydrocephalus or extra-axial collection. No abnormal susceptibility. Vascular: Normal flow voids. Skull and upper cervical spine: Normal marrow signal. Sinuses/Orbits: Trace fluid in the left sphenoid sinus. Other: None. MRA HEAD FINDINGS Anterior circulation: Intracranial ICAs are patent without stenosis or aneurysm. The proximal ACAs and MCAs are patent without stenosis or aneurysm. Distal branches are symmetric. Posterior circulation:  Normal basilar artery. The SCAs, AICAs and PICAs are patent proximally. The PCAs are patent proximally without stenosis or aneurysm. Distal branches are symmetric. Anatomic variants: Dominant left posterior communicating artery. IMPRESSION: 1. No acute intracranial abnormality. 2. No intracranial large vessel occlusion or high-grade stenosis. 3. Moderate to severe chronic small-vessel disease. Electronically Signed   By: Orvan Falconer M.D.   On: 01/05/2023 19:16   CT HEAD CODE STROKE WO CONTRAST  Result Date: 01/05/2023 CLINICAL DATA:  Code stroke.  Neuro deficit, acute, stroke suspected EXAM: CT HEAD WITHOUT CONTRAST TECHNIQUE: Contiguous axial images were obtained from the base of the skull through the vertex without intravenous contrast. RADIATION DOSE REDUCTION: This exam was performed according to the departmental dose-optimization program which includes automated exposure control, adjustment of the mA and/or kV according to patient size and/or use of iterative reconstruction technique. COMPARISON:  CT head Jan 01, 2023. FINDINGS: Brain: No evidence of acute large vascular territory infarction, hemorrhage, hydrocephalus, extra-axial collection or mass lesion/mass effect. Patchy white matter hypodensities, nonspecific but compatible with chronic microvascular ischemic disease. Vascular: No hyperdense vessel identified. Skull: No acute fracture. Sinuses/Orbits: Air-fluid levels in the sphenoid sinuses with additional scattered mild paranasal sinus mucosal thickening. No acute orbital findings. Other: No mastoid effusions. ASPECTS [facility] Stroke Program Early CT Score) total score (0-10 with 10 being normal): 10. IMPRESSION: 1. No evidence of acute large vascular territory infarct or acute hemorrhage. ASPECTS is 10. 2. Chronic microvascular ischemic disease. Code stroke imaging results were communicated on 01/05/2023 at 4:31 pm to provider Dr. Roda Shutters via secure text paging. Electronically Signed   By: Feliberto Harts M.D.   On: 01/05/2023 16:31   CT Head Wo Contrast  Result Date: 01/01/2023 CLINICAL DATA:  Head trauma, minor (Age >= 65y). EXAM: CT HEAD WITHOUT CONTRAST TECHNIQUE: Contiguous axial images were obtained from the base of the skull through the vertex without intravenous contrast. RADIATION DOSE REDUCTION: This exam was performed according to the departmental dose-optimization program which includes automated exposure control, adjustment of the mA and/or kV according to patient size and/or use of iterative reconstruction technique. COMPARISON:  Head CT report from 03/06/2020 (images not available) FINDINGS: Brain: There is no evidence of an acute cortically based infarct, intracranial hemorrhage, mass, midline shift, or extra-axial fluid collection. Patchy and confluent hypodensities in the cerebral white matter and deep gray nuclei bilaterally are nonspecific but compatible with extensive chronic small vessel ischemic disease. Cerebral atrophy is mild for age. Vascular: Calcified atherosclerosis at the skull base. No hyperdense vessel. Skull: No acute fracture or suspicious osseous lesion. Sinuses/Orbits: Mild mucosal thickening in the paranasal sinuses. Clear mastoid air cells. Bilateral cataract extraction. Other: None. IMPRESSION: 1. No evidence of acute intracranial abnormality. 2. Extensive chronic small vessel ischemic disease. Electronically Signed   By: Sebastian Ache M.D.   On: 01/01/2023 11:04     PHYSICAL EXAM  Temp:  [97.6 F (36.4 C)-97.9 F (36.6 C)] 97.9 F (36.6 C) (05/31 0800) Pulse Rate:  [62-78] 67 (05/31 0320) Resp:  [17-24] 20 (05/31 1313) BP: (129-203)/(71-105) 183/105 (05/31 0800) SpO2:  [96 %-100 %] 97 % (05/31 0320) Weight:  [57.8 kg-60.4 kg] 57.8 kg (05/31 0500)  Mental Status: Patient is awake, alert, oriented to person, place, age/birth date. Disoriented to month and year.  Patient is able to give a clear and coherent history. 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    III,IV, VI: EOMI   V: Facial sensation is symmetric to touch VII: facial symmetrical VIII: hearing is intact to voice X: Uvula elevates symmetrically XI: Shoulder shrug is symmetric. XII: tongue is midline without atrophy or fasciculations.  Motor: Tone is normal. Bulk is normal. 5/5 strength was present in all four extremities.  Sensory: Sensation is symmetric to light touch  in the arms and legs. Cerebellar: FNF and HKS are intact bilaterally   ASSESSMENT/PLAN Ms. SALINA BLYTHER is a 89 y.o. female with history of hypertension, CHF from ALF for right facial droop. However, improved on ED arrival. Patient is blind at baseline. No other deficits noted. On ED exam, patient is disoriented to month and year, oriented to place, age, birthdate. Slight right facial droop present. CT head negative.     ? Encephalopathy, not quite convinced for TIA CT no acute finding MRI  no infarct MRA  unremarkable Carotid Doppler unremarkable 2D Echo  EF 65-70% LDL 70 HgbA1c 5.4 lovenox for VTE prophylaxis No antithrombotic prior to admission, now on aspirin 81 mg daily.  Patient counseled to be compliant with her antithrombotic medications Ongoing aggressive stroke risk factor management Therapy recommendations:  SNF Disposition:  back to SNF today  Hypertension Unstable Long term BP goal normotensive  Hyperlipidemia Home meds:  crestor 10  LDL 70, goal < 70 Now on crestor 10 No high intensity statin due to advanced age and LDL at goal Continue statin at discharge  Other Stroke Risk Factors Advanced age Hx of CHF  Other Active Problems AKI Cre 1.20->1.15 Mild hyponatremia - Na 133  Hospital day # 0  Neurology will sign off. Please call with questions. Pt will follow up with stroke clinic NP at [facility] in about 4 weeks. Thanks  for the consult.   Marvel Plan, MD PhD Stroke Neurology 01/06/2023 3:14 PM    To contact Stroke  Continuity provider, please refer to [email or web address]. After hours, contact General Neurology

## 2023-01-06 NOTE — Care Management Obs Status (Signed)
MEDICARE OBSERVATION STATUS NOTIFICATION   Patient Details  Name: Lindsey Butler MRN: [id number] Date of Birth: 1928-09-27   Medicare Observation Status Notification Given:  Yes    Gordy Clement, RN 01/06/2023, 2:59 PM

## 2023-01-06 NOTE — Discharge Summary (Addendum)
PATIENT DETAILS Name: Lindsey Butler Age: 89 y.o. Sex: female Date of Birth: Dec 07, 1928 MRN: [id number]. Admitting Physician: Dorcas Carrow, MD [id number], [name], NP  Admit Date: 01/05/2023 Discharge date: 01/06/2023  Recommendations for Outpatient Follow-up:  Follow up with PCP in 1-2 weeks Please obtain CMP/CBC in one week  Admitted From:  Home  Disposition: Home health   Discharge Condition: fair  CODE STATUS:   Code Status: DNR   Diet recommendation:  Diet Order             Diet Heart Room service appropriate? No; Fluid consistency: Thin  Diet effective now           Diet - low sodium heart healthy                    Brief Summary: 89 year old on chronic prednisone, HTN, HLD, dementia, blindness-who presented with right-sided lean and right-sided facial weakness.    Workup 5/30>> A1c: 5.4 5/30>> MRI brain: no CVA 5/30>> MRI brain: No LVO/high-grade stenosis 5/31>> echo:EF 65-70% 5/31>> carotid Doppler:no significant stenosis 5/31>> LDL: 70  Brief Hospital Course: Possible TIA Per neurology-patient did not have any facial droop or any concerning findings on their evaluation in the ED Unclear if this was a TIA Workup as above Plans are to continue aspirin on discharge, continue statin Patient is fully-elderly-plan-not a candidate for aggressive care.  Hypothyroidism Synthroid  Chronic HFpEF Euvolemic  HTN BP on the higher side Continue losartan Added amlodipine PCP to follow and optimize  OA Chronic steroid therapy Continue usual prednisone dosage 5 mg daily  Mild intermittent asthma Stable Bronchodilators  Multiple skin tears/ulcerations Continue Doxy  BMI: Estimated body mass index is 20.57 kg/m as calculated from the following:   Height as of this encounter: 5\' 6"  (1.676 m).   Weight as of this encounter: 57.8 kg.    Discharge Diagnoses:  Principal Problem:   TIA (transient ischemic attack) Active Problems:    Post-operative hypothyroidism   Essential hypertension   Chronic heart failure with preserved ejection fraction (HFpEF) (HCC)   Discharge Instructions:  Activity:  As tolerated with Full fall precautions use walker/cane & assistance as needed  Discharge Instructions     Diet - low sodium heart healthy   Complete by: As directed    Increase activity slowly   Complete by: As directed       Allergies as of 01/06/2023   No Known Allergies      Medication List     STOP taking these medications    budesonide 0.5 MG/2ML nebulizer solution Commonly known as: PULMICORT   cephALEXin 250 MG capsule Commonly known as: KEFLEX   furosemide 20 MG tablet Commonly known as: LASIX   methocarbamol 500 MG tablet Commonly known as: ROBAXIN   polyethylene glycol 17 g packet Commonly known as: MIRALAX / GLYCOLAX       TAKE these medications    acetaminophen 500 MG tablet Commonly known as: TYLENOL Take 2 tablets (1,000 mg total) by mouth every 8 (eight) hours.   albuterol 108 (90 Base) MCG/ACT inhaler Commonly known as: VENTOLIN HFA Inhale 2 puffs into the lungs every 6 (six) hours as needed for wheezing or shortness of breath.   amLODipine 5 MG tablet Commonly known as: NORVASC Take 1 tablet (5 mg total) by mouth daily. Start taking on: January 07, 2023   aspirin 81 MG chewable tablet Chew 1 tablet (81 mg total) by mouth daily. Start taking on: January 07, 2023  calcium carbonate 1500 (600 Ca) MG Tabs tablet Commonly known as: OSCAL Take 1,500 mg by mouth daily.   cholecalciferol 25 MCG (1000 UNIT) tablet Commonly known as: VITAMIN D3 Take 1,000 Units by mouth daily.   cyanocobalamin 1000 MCG tablet Commonly known as: VITAMIN B12 Take 1,000 mcg by mouth daily.   doxycycline 100 MG tablet Commonly known as: ADOXA Take 100 mg by mouth 2 (two) times daily.   famotidine 40 MG tablet Commonly known as: PEPCID Take 40 mg by mouth at bedtime.   lactobacillus  acidophilus Tabs tablet Take 1 tablet by mouth 3 (three) times daily.   levothyroxine 100 MCG tablet Commonly known as: SYNTHROID Take 100 mcg by mouth daily.   losartan 50 MG tablet Commonly known as: COZAAR Take 50 mg by mouth daily.   meloxicam 15 MG tablet Commonly known as: MOBIC Take 15 mg by mouth daily.   montelukast 10 MG tablet Commonly known as: SINGULAIR Take 1 tablet (10 mg total) by mouth at bedtime.   omeprazole 40 MG capsule Commonly known as: PRILOSEC Take 40 mg by mouth daily.   predniSONE 5 MG tablet Commonly known as: DELTASONE Take 5 mg by mouth daily.   rosuvastatin 10 MG tablet Commonly known as: CRESTOR Take 10 mg by mouth at bedtime.   senna 8.6 MG Tabs tablet Commonly known as: SENOKOT Take 1 tablet (8.6 mg total) by mouth 2 (two) times daily. Hold for diarrhea.   sertraline 50 MG tablet Commonly known as: ZOLOFT Take 50 mg by mouth daily.        Follow-up Information     Irven Baltimore, NP. Schedule an appointment as soon as possible for a visit in 1 week(s).   Specialties: Nurse Practitioner, Gerontology Contact information: [address] [place] [address] [phone]                No Known Allergies   Other Procedures/Studies: VAS US CAROTID  Result Date: 01/06/2023 Carotid Arterial Duplex Study Patient Name:  Lindsey Butler  Date of Exam:   01/06/2023 Medical Rec #: [id number]        Accession #:    [id number] Date of Birth: 1929/08/04        Patient Gender: F Patient Age:   30 years Exam Location:  [facility] Procedure:      VAS US CAROTID Referring Phys: Jeoffrey Massed --------------------------------------------------------------------------------  Indications:       TIA and facial droop. Risk Factors:      Hypertension, hyperlipidemia. Limitations        Today's exam was limited due to patient movement. Comparison Study:  No previous study. Performing Technologist: McKayla Maag RVT, [place]  Examination  Guidelines: A complete evaluation includes B-mode imaging, spectral Doppler, color Doppler, and power Doppler as needed of all accessible portions of each vessel. Bilateral testing is considered an integral part of a complete examination. Limited examinations for reoccurring indications may be performed as noted.  Right Carotid Findings: +---------+--------+-------+--------+---------------------------------+--------+          PSV cm/sEDV    StenosisPlaque Description               Comments                  cm/s                                                     +---------+--------+-------+--------+---------------------------------+--------+  CCA Prox 59      6                                                        +---------+--------+-------+--------+---------------------------------+--------+ CCA      48      6              irregular, heterogenous and               Distal                          calcific                                  +---------+--------+-------+--------+---------------------------------+--------+ ICA Prox 88      9      1-39%   heterogenous, irregular and                                               calcific                                  +---------+--------+-------+--------+---------------------------------+--------+ ICA      65      17                                                       Distal                                                                    +---------+--------+-------+--------+---------------------------------+--------+ ECA      108     13                                                       +---------+--------+-------+--------+---------------------------------+--------+ +----------+--------+-------+----------------+-------------------+           PSV cm/sEDV cmsDescribe        Arm Pressure (mmHG) +----------+--------+-------+----------------+-------------------+ [id number]              Multiphasic, WNL                    +----------+--------+-------+----------------+-------------------+ +---------+--------+--+--------+-+---------+ VertebralPSV cm/s39EDV cm/s4Antegrade +---------+--------+--+--------+-+---------+  Left Carotid Findings: +----------+-------+-------+--------+---------------------------------+--------+           PSV    EDV    StenosisPlaque Description               Comments           cm/s   cm/s                                                     +----------+-------+-------+--------+---------------------------------+--------+  CCA Prox  45     10                                                       +----------+-------+-------+--------+---------------------------------+--------+ CCA Distal38     11             irregular, heterogenous and                                               calcific                                  +----------+-------+-------+--------+---------------------------------+--------+ ICA Prox  35     9      1-39%   irregular, heterogenous and                                               calcific                                  +----------+-------+-------+--------+---------------------------------+--------+ ICA Distal55     15                                                       +----------+-------+-------+--------+---------------------------------+--------+ ECA       33     0                                                        +----------+-------+-------+--------+---------------------------------+--------+ +----------+--------+--------+----------------+-------------------+           PSV cm/sEDV cm/sDescribe        Arm Pressure (mmHG) +----------+--------+--------+----------------+-------------------+ [id number]             Multiphasic, WNL                    +----------+--------+--------+----------------+-------------------+ +---------+--------+--+--------+-+---------+  VertebralPSV cm/s29EDV cm/s9Antegrade +---------+--------+--+--------+-+---------+   Summary: Right Carotid: Velocities in the right ICA are consistent with a 1-39% stenosis. Left Carotid: Velocities in the left ICA are consistent with a 1-39% stenosis. Vertebrals:  Bilateral vertebral arteries demonstrate antegrade flow. Subclavians: Normal flow hemodynamics were seen in bilateral subclavian              arteries. *See table(s) above for measurements and observations.  Electronically signed by Sherald Hess MD on 01/06/2023 at 2:52:20 PM.    Final    MR BRAIN WO CONTRAST  Result Date: 01/05/2023 CLINICAL DATA:  Neuro deficit, acute, stroke suspected. EXAM: MRI HEAD WITHOUT CONTRAST MRA HEAD WITHOUT CONTRAST TECHNIQUE: Multiplanar, multi-echo pulse sequences of the brain and surrounding structures were acquired without intravenous contrast. Angiographic images of the Circle of Willis were  acquired using MRA technique without intravenous contrast. COMPARISON:  Head CT 01/05/2023. FINDINGS: MRI HEAD FINDINGS Brain: No acute infarct or hemorrhage. Moderate to severe chronic small-vessel disease no mass or midline shift. No hydrocephalus or extra-axial collection. No abnormal susceptibility. Vascular: Normal flow voids. Skull and upper cervical spine: Normal marrow signal. Sinuses/Orbits: Trace fluid in the left sphenoid sinus. Other: None. MRA HEAD FINDINGS Anterior circulation: Intracranial ICAs are patent without stenosis or aneurysm. The proximal ACAs and MCAs are patent without stenosis or aneurysm. Distal branches are symmetric. Posterior circulation: Normal basilar artery. The SCAs, AICAs and PICAs are patent proximally. The PCAs are patent proximally without stenosis or aneurysm. Distal branches are symmetric. Anatomic variants: Dominant left posterior communicating artery. IMPRESSION: 1. No acute intracranial abnormality. 2. No intracranial large vessel occlusion or high-grade stenosis. 3.  Moderate to severe chronic small-vessel disease. Electronically Signed   By: Orvan Falconer M.D.   On: 01/05/2023 19:16   MR ANGIO HEAD WO CONTRAST  Result Date: 01/05/2023 CLINICAL DATA:  Neuro deficit, acute, stroke suspected. EXAM: MRI HEAD WITHOUT CONTRAST MRA HEAD WITHOUT CONTRAST TECHNIQUE: Multiplanar, multi-echo pulse sequences of the brain and surrounding structures were acquired without intravenous contrast. Angiographic images of the Circle of Willis were acquired using MRA technique without intravenous contrast. COMPARISON:  Head CT 01/05/2023. FINDINGS: MRI HEAD FINDINGS Brain: No acute infarct or hemorrhage. Moderate to severe chronic small-vessel disease no mass or midline shift. No hydrocephalus or extra-axial collection. No abnormal susceptibility. Vascular: Normal flow voids. Skull and upper cervical spine: Normal marrow signal. Sinuses/Orbits: Trace fluid in the left sphenoid sinus. Other: None. MRA HEAD FINDINGS Anterior circulation: Intracranial ICAs are patent without stenosis or aneurysm. The proximal ACAs and MCAs are patent without stenosis or aneurysm. Distal branches are symmetric. Posterior circulation: Normal basilar artery. The SCAs, AICAs and PICAs are patent proximally. The PCAs are patent proximally without stenosis or aneurysm. Distal branches are symmetric. Anatomic variants: Dominant left posterior communicating artery. IMPRESSION: 1. No acute intracranial abnormality. 2. No intracranial large vessel occlusion or high-grade stenosis. 3. Moderate to severe chronic small-vessel disease. Electronically Signed   By: Orvan Falconer M.D.   On: 01/05/2023 19:16   CT HEAD CODE STROKE WO CONTRAST  Result Date: 01/05/2023 CLINICAL DATA:  Code stroke.  Neuro deficit, acute, stroke suspected EXAM: CT HEAD WITHOUT CONTRAST TECHNIQUE: Contiguous axial images were obtained from the base of the skull through the vertex without intravenous contrast. RADIATION DOSE REDUCTION: This exam  was performed according to the departmental dose-optimization program which includes automated exposure control, adjustment of the mA and/or kV according to patient size and/or use of iterative reconstruction technique. COMPARISON:  CT head Jan 01, 2023. FINDINGS: Brain: No evidence of acute large vascular territory infarction, hemorrhage, hydrocephalus, extra-axial collection or mass lesion/mass effect. Patchy white matter hypodensities, nonspecific but compatible with chronic microvascular ischemic disease. Vascular: No hyperdense vessel identified. Skull: No acute fracture. Sinuses/Orbits: Air-fluid levels in the sphenoid sinuses with additional scattered mild paranasal sinus mucosal thickening. No acute orbital findings. Other: No mastoid effusions. ASPECTS [facility] Stroke Program Early CT Score) total score (0-10 with 10 being normal): 10. IMPRESSION: 1. No evidence of acute large vascular territory infarct or acute hemorrhage. ASPECTS is 10. 2. Chronic microvascular ischemic disease. Code stroke imaging results were communicated on 01/05/2023 at 4:31 pm to provider Dr. Roda Shutters via secure text paging. Electronically Signed   By: Feliberto Harts M.D.   On: 01/05/2023 16:31   CT Head Wo Contrast  Result  Date: 01/01/2023 CLINICAL DATA:  Head trauma, minor (Age >= 65y). EXAM: CT HEAD WITHOUT CONTRAST TECHNIQUE: Contiguous axial images were obtained from the base of the skull through the vertex without intravenous contrast. RADIATION DOSE REDUCTION: This exam was performed according to the departmental dose-optimization program which includes automated exposure control, adjustment of the mA and/or kV according to patient size and/or use of iterative reconstruction technique. COMPARISON:  Head CT report from 03/06/2020 (images not available) FINDINGS: Brain: There is no evidence of an acute cortically based infarct, intracranial hemorrhage, mass, midline shift, or extra-axial fluid collection. Patchy and confluent  hypodensities in the cerebral white matter and deep gray nuclei bilaterally are nonspecific but compatible with extensive chronic small vessel ischemic disease. Cerebral atrophy is mild for age. Vascular: Calcified atherosclerosis at the skull base. No hyperdense vessel. Skull: No acute fracture or suspicious osseous lesion. Sinuses/Orbits: Mild mucosal thickening in the paranasal sinuses. Clear mastoid air cells. Bilateral cataract extraction. Other: None. IMPRESSION: 1. No evidence of acute intracranial abnormality. 2. Extensive chronic small vessel ischemic disease. Electronically Signed   By: Sebastian Ache M.D.   On: 01/01/2023 11:04     TODAY-DAY OF DISCHARGE:  Subjective:   Lindsey Butler today has no headache,no chest abdominal pain,no new weakness tingling or numbness, feels much better wants to go home today.   Objective:   Blood pressure (!) 183/105, pulse 67, temperature 97.9 F (36.6 C), temperature source Oral, resp. rate 20, height 5\' 6"  (1.676 m), weight 57.8 kg, SpO2 97 %.  Intake/Output Summary (Last 24 hours) at 01/06/2023 1517 Last data filed at 01/06/2023 0800 Gross per 24 hour  Intake 60 ml  Output 900 ml  Net -840 ml   Filed Weights   01/05/23 1600 01/06/23 0500  Weight: 60.4 kg 57.8 kg    Exam: Awake Alert, Oriented *3, No new F.N deficits, Normal affect Sharon.AT,PERRAL Supple Neck,No JVD, No cervical lymphadenopathy appriciated.  Symmetrical Chest wall movement, Good air movement bilaterally, CTAB RRR,No Gallops,Rubs or new Murmurs, No Parasternal Heave +ve B.Sounds, Abd Soft, Non tender, No organomegaly appriciated, No rebound -guarding or rigidity. No Cyanosis, Clubbing or edema, No new Rash or bruise   PERTINENT RADIOLOGIC STUDIES: VAS US CAROTID  Result Date: 01/06/2023 Carotid Arterial Duplex Study Patient Name:  Lindsey Butler  Date of Exam:   01/06/2023 Medical Rec #: [id number]        Accession #:    [id number] Date of Birth: 24-May-1929        Patient  Gender: F Patient Age:   40 years Exam Location:  [facility] Procedure:      VAS US CAROTID Referring Phys: Jeoffrey Massed --------------------------------------------------------------------------------  Indications:       TIA and facial droop. Risk Factors:      Hypertension, hyperlipidemia. Limitations        Today's exam was limited due to patient movement. Comparison Study:  No previous study. Performing Technologist: McKayla Maag RVT, [place]  Examination Guidelines: A complete evaluation includes B-mode imaging, spectral Doppler, color Doppler, and power Doppler as needed of all accessible portions of each vessel. Bilateral testing is considered an integral part of a complete examination. Limited examinations for reoccurring indications may be performed as noted.  Right Carotid Findings: +---------+--------+-------+--------+---------------------------------+--------+          PSV cm/sEDV    StenosisPlaque Description               Comments  cm/s                                                     +---------+--------+-------+--------+---------------------------------+--------+ CCA Prox 59      6                                                        +---------+--------+-------+--------+---------------------------------+--------+ CCA      48      6              irregular, heterogenous and               Distal                          calcific                                  +---------+--------+-------+--------+---------------------------------+--------+ ICA Prox 88      9      1-39%   heterogenous, irregular and                                               calcific                                  +---------+--------+-------+--------+---------------------------------+--------+ ICA      65      17                                                       Distal                                                                     +---------+--------+-------+--------+---------------------------------+--------+ ECA      108     13                                                       +---------+--------+-------+--------+---------------------------------+--------+ +----------+--------+-------+----------------+-------------------+           PSV cm/sEDV cmsDescribe        Arm Pressure (mmHG) +----------+--------+-------+----------------+-------------------+ [id number]             Multiphasic, WNL                    +----------+--------+-------+----------------+-------------------+ +---------+--------+--+--------+-+---------+ VertebralPSV cm/s39EDV cm/s4Antegrade +---------+--------+--+--------+-+---------+  Left Carotid Findings: +----------+-------+-------+--------+---------------------------------+--------+  PSV    EDV    StenosisPlaque Description               Comments           cm/s   cm/s                                                     +----------+-------+-------+--------+---------------------------------+--------+ CCA Prox  45     10                                                       +----------+-------+-------+--------+---------------------------------+--------+ CCA Distal38     11             irregular, heterogenous and                                               calcific                                  +----------+-------+-------+--------+---------------------------------+--------+ ICA Prox  35     9      1-39%   irregular, heterogenous and                                               calcific                                  +----------+-------+-------+--------+---------------------------------+--------+ ICA Distal55     15                                                       +----------+-------+-------+--------+---------------------------------+--------+ ECA       33     0                                                         +----------+-------+-------+--------+---------------------------------+--------+ +----------+--------+--------+----------------+-------------------+           PSV cm/sEDV cm/sDescribe        Arm Pressure (mmHG) +----------+--------+--------+----------------+-------------------+ [id number]             Multiphasic, WNL                    +----------+--------+--------+----------------+-------------------+ +---------+--------+--+--------+-+---------+ VertebralPSV cm/s29EDV cm/s9Antegrade +---------+--------+--+--------+-+---------+   Summary: Right Carotid: Velocities in the right ICA are consistent with a 1-39% stenosis. Left Carotid: Velocities in the left ICA are consistent with a 1-39% stenosis. Vertebrals:  Bilateral vertebral arteries demonstrate antegrade flow. Subclavians: Normal flow hemodynamics were seen  in bilateral subclavian              arteries. *See table(s) above for measurements and observations.  Electronically signed by Sherald Hess MD on 01/06/2023 at 2:52:20 PM.    Final    MR BRAIN WO CONTRAST  Result Date: 01/05/2023 CLINICAL DATA:  Neuro deficit, acute, stroke suspected. EXAM: MRI HEAD WITHOUT CONTRAST MRA HEAD WITHOUT CONTRAST TECHNIQUE: Multiplanar, multi-echo pulse sequences of the brain and surrounding structures were acquired without intravenous contrast. Angiographic images of the Circle of Willis were acquired using MRA technique without intravenous contrast. COMPARISON:  Head CT 01/05/2023. FINDINGS: MRI HEAD FINDINGS Brain: No acute infarct or hemorrhage. Moderate to severe chronic small-vessel disease no mass or midline shift. No hydrocephalus or extra-axial collection. No abnormal susceptibility. Vascular: Normal flow voids. Skull and upper cervical spine: Normal marrow signal. Sinuses/Orbits: Trace fluid in the left sphenoid sinus. Other: None. MRA HEAD FINDINGS Anterior circulation: Intracranial ICAs are patent without stenosis or aneurysm. The  proximal ACAs and MCAs are patent without stenosis or aneurysm. Distal branches are symmetric. Posterior circulation: Normal basilar artery. The SCAs, AICAs and PICAs are patent proximally. The PCAs are patent proximally without stenosis or aneurysm. Distal branches are symmetric. Anatomic variants: Dominant left posterior communicating artery. IMPRESSION: 1. No acute intracranial abnormality. 2. No intracranial large vessel occlusion or high-grade stenosis. 3. Moderate to severe chronic small-vessel disease. Electronically Signed   By: Orvan Falconer M.D.   On: 01/05/2023 19:16   MR ANGIO HEAD WO CONTRAST  Result Date: 01/05/2023 CLINICAL DATA:  Neuro deficit, acute, stroke suspected. EXAM: MRI HEAD WITHOUT CONTRAST MRA HEAD WITHOUT CONTRAST TECHNIQUE: Multiplanar, multi-echo pulse sequences of the brain and surrounding structures were acquired without intravenous contrast. Angiographic images of the Circle of Willis were acquired using MRA technique without intravenous contrast. COMPARISON:  Head CT 01/05/2023. FINDINGS: MRI HEAD FINDINGS Brain: No acute infarct or hemorrhage. Moderate to severe chronic small-vessel disease no mass or midline shift. No hydrocephalus or extra-axial collection. No abnormal susceptibility. Vascular: Normal flow voids. Skull and upper cervical spine: Normal marrow signal. Sinuses/Orbits: Trace fluid in the left sphenoid sinus. Other: None. MRA HEAD FINDINGS Anterior circulation: Intracranial ICAs are patent without stenosis or aneurysm. The proximal ACAs and MCAs are patent without stenosis or aneurysm. Distal branches are symmetric. Posterior circulation: Normal basilar artery. The SCAs, AICAs and PICAs are patent proximally. The PCAs are patent proximally without stenosis or aneurysm. Distal branches are symmetric. Anatomic variants: Dominant left posterior communicating artery. IMPRESSION: 1. No acute intracranial abnormality. 2. No intracranial large vessel occlusion or  high-grade stenosis. 3. Moderate to severe chronic small-vessel disease. Electronically Signed   By: Orvan Falconer M.D.   On: 01/05/2023 19:16   CT HEAD CODE STROKE WO CONTRAST  Result Date: 01/05/2023 CLINICAL DATA:  Code stroke.  Neuro deficit, acute, stroke suspected EXAM: CT HEAD WITHOUT CONTRAST TECHNIQUE: Contiguous axial images were obtained from the base of the skull through the vertex without intravenous contrast. RADIATION DOSE REDUCTION: This exam was performed according to the departmental dose-optimization program which includes automated exposure control, adjustment of the mA and/or kV according to patient size and/or use of iterative reconstruction technique. COMPARISON:  CT head Jan 01, 2023. FINDINGS: Brain: No evidence of acute large vascular territory infarction, hemorrhage, hydrocephalus, extra-axial collection or mass lesion/mass effect. Patchy white matter hypodensities, nonspecific but compatible with chronic microvascular ischemic disease. Vascular: No hyperdense vessel identified. Skull: No acute fracture. Sinuses/Orbits: Air-fluid levels in the sphenoid sinuses with  additional scattered mild paranasal sinus mucosal thickening. No acute orbital findings. Other: No mastoid effusions. ASPECTS [facility] Stroke Program Early CT Score) total score (0-10 with 10 being normal): 10. IMPRESSION: 1. No evidence of acute large vascular territory infarct or acute hemorrhage. ASPECTS is 10. 2. Chronic microvascular ischemic disease. Code stroke imaging results were communicated on 01/05/2023 at 4:31 pm to provider Dr. Roda Shutters via secure text paging. Electronically Signed   By: Feliberto Harts M.D.   On: 01/05/2023 16:31     PERTINENT LAB RESULTS: CBC: Recent Labs    01/05/23 1617 01/06/23 0646  WBC  --  8.8  HGB 12.9 12.9  HCT 38.0 39.4  PLT  --  374   CMET CMP     Component Value Date/Time   NA 133 (L) 01/05/2023 1623   K 5.0 01/05/2023 1623   CL 99 01/05/2023 1623   CO2 23  01/05/2023 1623   GLUCOSE 132 (H) 01/05/2023 1623   BUN 24 (H) 01/05/2023 1623   CREATININE 1.15 (H) 01/05/2023 1623   CALCIUM 10.4 (H) 01/05/2023 1623   PROT 7.1 03/02/2021 1015   ALBUMIN 3.4 (L) 03/02/2021 1015   AST 14 (L) 03/02/2021 1015   ALT 13 03/02/2021 1015   ALKPHOS 72 03/02/2021 1015   BILITOT 0.4 03/02/2021 1015   GFRNONAA 45 (L) 01/05/2023 1623   GFRAA >60 10/07/2019 0457    GFR Estimated Creatinine Clearance: 28.5 mL/min (A) (by C-G formula based on SCr of 1.15 mg/dL (H)). No results for input(s): "LIPASE", "AMYLASE" in the last 72 hours. No results for input(s): "CKTOTAL", "CKMB", "CKMBINDEX", "TROPONINI" in the last 72 hours. Invalid input(s): "POCBNP" No results for input(s): "DDIMER" in the last 72 hours. Recent Labs    01/05/23 1615  HGBA1C 5.4   Recent Labs    01/06/23 0646  CHOL 206*  HDL 114  LDLCALC 70  TRIG 409  CHOLHDL 1.8   No results for input(s): "TSH", "T4TOTAL", "T3FREE", "THYROIDAB" in the last 72 hours.  Invalid input(s): "FREET3" No results for input(s): "VITAMINB12", "FOLATE", "FERRITIN", "TIBC", "IRON", "RETICCTPCT" in the last 72 hours. Coags: Recent Labs    01/05/23 1623  INR 1.0   Microbiology: No results found for this or any previous visit (from the past 240 hour(s)).  FURTHER DISCHARGE INSTRUCTIONS:  Get Medicines reviewed and adjusted: Please take all your medications with you for your next visit with your Primary MD  Laboratory/radiological data: Please request your Primary MD to go over all hospital tests and procedure/radiological results at the follow up, please ask your Primary MD to get all Hospital records sent to his/her office.  In some cases, they will be blood work, cultures and biopsy results pending at the time of your discharge. Please request that your primary care M.D. goes through all the records of your hospital data and follows up on these results.  Also Note the following: If you experience  worsening of your admission symptoms, develop shortness of breath, life threatening emergency, suicidal or homicidal thoughts you must seek medical attention immediately by calling 911 or calling your MD immediately  if symptoms less severe.  You must read complete instructions/literature along with all the possible adverse reactions/side effects for all the Medicines you take and that have been prescribed to you. Take any new Medicines after you have completely understood and accpet all the possible adverse reactions/side effects.   Do not drive when taking Pain medications or sleeping medications (Benzodaizepines)  Do not take more than prescribed  Pain, Sleep and Anxiety Medications. It is not advisable to combine anxiety,sleep and pain medications without talking with your primary care practitioner  Special Instructions: If you have smoked or chewed Tobacco  in the last 2 yrs please stop smoking, stop any regular Alcohol  and or any Recreational drug use.  Wear Seat belts while driving.  Please note: You were cared for by a hospitalist during your hospital stay. Once you are discharged, your primary care physician will handle any further medical issues. Please note that NO REFILLS for any discharge medications will be authorized once you are discharged, as it is imperative that you return to your primary care physician (or establish a relationship with a primary care physician if you do not have one) for your post hospital discharge needs so that they can reassess your need for medications and monitor your lab values.  Total Time spent coordinating discharge including counseling, education and face to face time equals greater than 30 minutes.  SignedJeoffrey Massed 01/06/2023 3:17 PM

## 2023-01-06 NOTE — TOC Progression Note (Signed)
Transition of Care [facility]) - Progression Note    Patient Details  Name: Lindsey Butler MRN: [id number] Date of Birth: Jul 27, 1929  Transition of Care [facility]) CM/SW Contact  Erin Sons, [place] Phone Number: 01/06/2023, 3:45 PM  Clinical Narrative:     CSW called pts ALF, [facility], and spoke with Chasity. CSW informed them of DC today. CSW discussed pt's limited mobility and is informed that pt did have a recent decline at their facility. They are okay to have pt return whether with [facility] or with Hospice. CSW spoke with pt's daughter in law who is unsure about home health vs hospice and states she will contact CSW with decision. Daughter stated that pt was already active with HH at ALF prior to admission.   1500: CSW called pt's DIL who wants pt to continue [facility] and will hold of on hospice for now. She is aware of pt's DC by PTAR today. CSW called ALF and confirmed they have [facility] in house. CSW faxed fl2, DC summary, HH orders, and face to face to ALF at [phone]  1530: PTAR scheduled for next available. RN notified. RN to call report to [phone]     Barriers to Discharge: No Barriers Identified  Expected Discharge Plan and Services       Living arrangements for the past 2 months: Assisted Living Facility Expected Discharge Date: 01/06/23                                     Social Determinants of Health (SDOH) Interventions SDOH Screenings   Food Insecurity: No Food Insecurity (01/05/2023)  Housing: Low Risk  (01/05/2023)  Transportation Needs: No Transportation Needs (01/05/2023)  Utilities: Not At Risk (01/05/2023)  Tobacco Use: Low Risk  (01/05/2023)    Readmission Risk Interventions     No data to display

## 2023-01-06 NOTE — NC FL2 (Addendum)
[place] MEDICAID FL2 LEVEL OF CARE FORM     IDENTIFICATION  Patient Name: Lindsey Butler Birthdate: [birth year] Sex: female Admission Date (Current Location): 01/05/2023  [facility] and [place] Number:  Producer, television/film/video and Address:  [facility], [address], [place], [place] [address]      Provider Number: [id number]  Attending Physician Name and Address:  Maretta Bees, MD  Relative Name and Phone Number:  Molitor,Kristal (Relative)  [phone] (Mobile)    Current Level of Care: Hospital Recommended Level of Care: Assisted Living Facility Prior Approval Number:    Date Approved/Denied:   PASRR Number:    Discharge Plan: Other (Comment) (ALF)    Current Diagnoses: Patient Active Problem List   Diagnosis Date Noted   TIA (transient ischemic attack) 01/05/2023   Gram-negative bacteremia 11/27/2022   Lobar pneumonia (HCC) 11/26/2022   Chronic heart failure with preserved ejection fraction (HFpEF) (HCC) 09/13/2022   Benign hypertension with coincident congestive heart failure (HCC) 09/13/2022   Aortic atherosclerosis (HCC) 09/13/2022   Hyperlipidemia LDL goal <130 09/13/2022   Pelvic fracture (HCC) 09/08/2022   Post-operative hypothyroidism 10/13/2019   Essential hypertension 10/13/2019   Major depression, recurrent, chronic (HCC) 10/13/2019   Hiatal hernia with GERD without esophagitis 10/13/2019   Increased intraocular pressure, bilateral 10/13/2019   Lumbar spondylosis 05/01/2018   Spinal stenosis of lumbar region without neurogenic claudication 09/27/2016   Basal cell carcinoma of skin of other parts of face 08/29/2016   Neuralgia, neuritis or radiculitis 03/27/2014   Osteoporosis 10/28/2013   Hypothyroidism 07/17/2013   Asthma 09/14/2012   Diverticulosis of colon 05/05/2011    Orientation RESPIRATION BLADDER Height & Weight     Self  Normal Continent Weight: 127 lb 6.8 oz (57.8 kg) Height:  5\' 6"  (167.6 cm)  BEHAVIORAL  SYMPTOMS/MOOD NEUROLOGICAL BOWEL NUTRITION STATUS      Continent Diet (regular, no salt added)  AMBULATORY STATUS COMMUNICATION OF NEEDS Skin   Extensive Assist Verbally Normal                       Personal Care Assistance Level of Assistance  Bathing, Feeding, Dressing Bathing Assistance: Maximum assistance Feeding assistance: Independent Dressing Assistance: Maximum assistance     Functional Limitations Info  Sight, Hearing, Speech Sight Info: Impaired Hearing Info: Adequate Speech Info: Adequate    SPECIAL CARE FACTORS FREQUENCY  PT (By licensed PT), OT (By licensed OT)     PT Frequency: 2-3x/week OT Frequency: 2-3x/week            Contractures Contractures Info: Not present    Additional Factors Info  Code Status, Allergies Code Status Info: DNR Allergies Info: no known allergies              Discharge Medications: Medication List       STOP taking these medications     budesonide 0.5 MG/2ML nebulizer solution Commonly known as: PULMICORT    cephALEXin 250 MG capsule Commonly known as: KEFLEX    furosemide 20 MG tablet Commonly known as: LASIX    methocarbamol 500 MG tablet Commonly known as: ROBAXIN    polyethylene glycol 17 g packet Commonly known as: MIRALAX / GLYCOLAX           TAKE these medications     acetaminophen 500 MG tablet Commonly known as: TYLENOL Take 2 tablets (1,000 mg total) by mouth every 8 (eight) hours.    albuterol 108 (90 Base) MCG/ACT  inhaler Commonly known as: VENTOLIN HFA Inhale 2 puffs into the lungs every 6 (six) hours as needed for wheezing or shortness of breath.    amLODipine 5 MG tablet Commonly known as: NORVASC Take 1 tablet (5 mg total) by mouth daily. Start taking on: January 07, 2023    aspirin 81 MG chewable tablet Chew 1 tablet (81 mg total) by mouth daily. Start taking on: January 07, 2023    calcium carbonate 1500 (600 Ca) MG Tabs tablet Commonly known as: OSCAL Take 1,500 mg by mouth  daily.    cholecalciferol 25 MCG (1000 UNIT) tablet Commonly known as: VITAMIN D3 Take 1,000 Units by mouth daily.    cyanocobalamin 1000 MCG tablet Commonly known as: VITAMIN B12 Take 1,000 mcg by mouth daily.    doxycycline 100 MG tablet Commonly known as: ADOXA Take 100 mg by mouth 2 (two) times daily.    famotidine 40 MG tablet Commonly known as: PEPCID Take 40 mg by mouth at bedtime.    lactobacillus acidophilus Tabs tablet Take 1 tablet by mouth 3 (three) times daily.    levothyroxine 100 MCG tablet Commonly known as: SYNTHROID Take 100 mcg by mouth daily.    losartan 50 MG tablet Commonly known as: COZAAR Take 50 mg by mouth daily.    meloxicam 15 MG tablet Commonly known as: MOBIC Take 15 mg by mouth daily.    montelukast 10 MG tablet Commonly known as: SINGULAIR Take 1 tablet (10 mg total) by mouth at bedtime.    omeprazole 40 MG capsule Commonly known as: PRILOSEC Take 40 mg by mouth daily.    predniSONE 5 MG tablet Commonly known as: DELTASONE Take 5 mg by mouth daily.    rosuvastatin 10 MG tablet Commonly known as: CRESTOR Take 10 mg by mouth at bedtime.    senna 8.6 MG Tabs tablet Commonly known as: SENOKOT Take 1 tablet (8.6 mg total) by mouth 2 (two) times daily. Hold for diarrhea.    sertraline 50 MG tablet Commonly known as: ZOLOFT Take 50 mg by mouth daily.    Relevant Imaging Results:  Relevant Lab Results:   Additional Information SSN: [id number] [place], [place]

## 2023-01-06 NOTE — Evaluation (Signed)
Physical Therapy Evaluation Patient Details Name: Lindsey Butler MRN: [id number] DOB: 1929/01/28 Today's Date: 01/06/2023  History of Present Illness  Pt is 89 year old presented to [facility] on  01/05/23 for rt facial droop. MRI negative/ Pt with frequent falls at ALF.  PMH - arthritis, THR after hip fx, bil TKR.  Clinical Impression  Pt admitted with above diagnosis and presents to PT with functional limitations due to deficits listed below (See PT problem list). Pt needs skilled PT to maximize independence and safety. Pt has been at ALF. Unsure of how much assist staff was providing pt. Currently pt needs assist for all mobility. If staff of ALF can provide that she could return there with HHPT. If ALF staff can't provide assist with mobility she will likely need continued inpatient follow up therapy, <3 hours/day          Recommendations for follow up therapy are one component of a multi-disciplinary discharge planning process, led by the attending physician.  Recommendations may be updated based on patient status, additional functional criteria and insurance authorization.  Follow Up Recommendations       Assistance Recommended at Discharge Frequent or constant Supervision/Assistance  Patient can return home with the following  A lot of help with walking and/or transfers;A lot of help with bathing/dressing/bathroom    Equipment Recommendations None recommended by PT  Recommendations for Other Services       Functional Status Assessment Patient has had a recent decline in their functional status and demonstrates the ability to make significant improvements in function in a reasonable and predictable amount of time.     Precautions / Restrictions Precautions Precautions: Fall Precaution Comments: HOH, severe visual impairment, posterior lean Restrictions Weight Bearing Restrictions: No      Mobility  Bed Mobility               General bed mobility comments: Pt up in  chair    Transfers Overall transfer level: Needs assistance Equipment used: Rolling walker (2 wheels) Transfers: Sit to/from Stand Sit to Stand: Mod assist           General transfer comment: Assist to power up and to correct significant posterior lean    Ambulation/Gait Ambulation/Gait assistance: Mod assist Gait Distance (Feet): 8 Feet (8' x 1, 5' x 1) Assistive device: Rolling walker (2 wheels) Gait Pattern/deviations: Step-through pattern, Decreased step length - right, Decreased step length - left, Shuffle, Leaning posteriorly, Trunk flexed Gait velocity: decr Gait velocity interpretation: <1.31 ft/sec, indicative of household ambulator   General Gait Details: Assist for balance and support  Information systems manager [place] (Stroke Patients Only)       Balance Overall balance assessment: Needs assistance, History of Falls Sitting-balance support: Bilateral upper extremity supported, Feet supported Sitting balance-Leahy Scale: Poor Sitting balance - Comments: UE support sitting edge of chair   Standing balance support: Bilateral upper extremity supported, During functional activity Standing balance-Leahy Scale: Poor Standing balance comment: Stood with walker with initial mod assist progressing to min assist                             Pertinent Vitals/Pain Pain Assessment Pain Assessment: No/denies pain    Home Living Family/patient expects to be discharged to:: Assisted living                 Home  Equipment: Agricultural consultant (2 wheels);Wheelchair - manual;BSC/3in1;Grab bars - tub/shower;Grab bars - toilet;Shower seat Additional Comments: per patient report.    Prior Function Prior Level of Function : Needs assist;Patient poor historian/Family not available       Physical Assist : Mobility (physical);ADLs (physical) Mobility (physical): Gait;Transfers ADLs (physical): Bathing;Dressing;IADLs Mobility  Comments: Pt states she uses the W/C to go to/from the bathroom at night; otherwise walks with RW. History of frequent falls. Pt also reports that she isn't supposed to walk alone but she will. ADLs Comments: Pt states that she needs assist with buttons, shoes, and socks. I predict she receives assistance for more.     Hand Dominance   Dominant Hand: Right    Extremity/Trunk Assessment   Upper Extremity Assessment Upper Extremity Assessment: Defer to OT evaluation RUE Deficits / Details: Right shoulder A/ROM very limited. Only able to achieve shoulder flexion to ~25% range. Pt does not know reason.    Lower Extremity Assessment Lower Extremity Assessment: Generalized weakness       Communication   Communication: HOH  Cognition Arousal/Alertness: Awake/alert Behavior During Therapy: WFL for tasks assessed/performed Overall Cognitive Status: No family/caregiver present to determine baseline cognitive functioning Area of Impairment: Orientation, Attention, Memory, Following commands, Safety/judgement, Awareness, Problem solving                 Orientation Level: Place, Time, Situation Current Attention Level: Sustained Memory: Decreased recall of precautions, Decreased short-term memory Following Commands: Follows one step commands consistently, Follows one step commands with increased time Safety/Judgement: Decreased awareness of safety, Decreased awareness of deficits Awareness: Intellectual Problem Solving: Slow processing, Decreased initiation, Requires tactile cues, Requires verbal cues, Difficulty sequencing          General Comments General comments (skin integrity, edema, etc.): VSS on RA    Exercises     Assessment/Plan    PT Assessment Patient needs continued PT services  PT Problem List Decreased strength;Decreased activity tolerance;Decreased balance;Decreased mobility;Decreased cognition       PT Treatment Interventions DME instruction;Gait  training;Functional mobility training;Therapeutic activities;Therapeutic exercise;Balance training;Patient/family education    PT Goals (Current goals can be found in the Care Plan section)  Acute Rehab PT Goals Patient Stated Goal: agreeable to work toward improving mobility PT Goal Formulation: With patient Time For Goal Achievement:  Potential to Achieve Goals: Fair    Frequency Min 3X/week     Co-evaluation               AM-PAC PT "6 Clicks" Mobility  Outcome Measure Help needed turning from your back to your side while in a flat bed without using bedrails?: A Lot Help needed moving from lying on your back to sitting on the side of a flat bed without using bedrails?: A Lot Help needed moving to and from a bed to a chair (including a wheelchair)?: A Lot Help needed standing up from a chair using your arms (e.g., wheelchair or bedside chair)?: A Lot Help needed to walk in hospital room?: A Lot Help needed climbing 3-5 steps with a railing? : Total 6 Click Score: 11    End of Session Equipment Utilized During Treatment: Gait belt Activity Tolerance: Patient limited by fatigue Patient left: in chair;with call bell/phone within reach;with chair alarm set Nurse Communication: Mobility status;Need for lift equipment Antony Salmon will make transfer easier) PT Visit Diagnosis: Unsteadiness on feet (R26.81);Other abnormalities of gait and mobility (R26.89);Muscle weakness (generalized) (M62.81);History of falling (Z91.81)    Time: 1610-9604 PT Time Calculation (  min) (ACUTE ONLY): 23 min   Charges:   PT Evaluation $PT Eval Moderate Complexity: 1 Mod PT Treatments $Gait Training: 8-22 mins        [facility] PT Acute Rehabilitation Services Office [phone]   Angelina Ok [facility] 01/06/2023, 2:03 PM

## 2023-01-06 NOTE — Progress Notes (Signed)
Carotid ultrasound study completed.   Please see CV Procedures for preliminary results.  Dameir Gentzler, RVT  1:54 PM 01/06/23

## 2023-01-06 NOTE — Progress Notes (Signed)
Speech Language Pathology  Patient Details Name: Lindsey Butler MRN: [id number] DOB: 1929-07-03 Today's Date: 01/06/2023 Time:  -     Chart reviewed/screened. Pt's MRI was negative. Admitted with facial droop, confused. MD note states "As per family, her mental status is at her baseline". Speech-language-cognitive eval does not appear warranted at this time. Please reconsult if needed.                                 Royce Macadamia  01/06/2023, 7:40 AM

## 2023-01-09 DIAGNOSIS — G459 Transient cerebral ischemic attack, unspecified: Secondary | ICD-10-CM | POA: Diagnosis not present

## 2023-01-09 DIAGNOSIS — M6281 Muscle weakness (generalized): Secondary | ICD-10-CM | POA: Diagnosis not present

## 2023-01-10 DIAGNOSIS — Z791 Long term (current) use of non-steroidal anti-inflammatories (NSAID): Secondary | ICD-10-CM | POA: Diagnosis not present

## 2023-01-10 DIAGNOSIS — I10 Essential (primary) hypertension: Secondary | ICD-10-CM | POA: Diagnosis not present

## 2023-01-10 DIAGNOSIS — I110 Hypertensive heart disease with heart failure: Secondary | ICD-10-CM | POA: Diagnosis not present

## 2023-01-10 DIAGNOSIS — J45909 Unspecified asthma, uncomplicated: Secondary | ICD-10-CM | POA: Diagnosis not present

## 2023-01-10 DIAGNOSIS — Z96653 Presence of artificial knee joint, bilateral: Secondary | ICD-10-CM | POA: Diagnosis not present

## 2023-01-10 DIAGNOSIS — M1990 Unspecified osteoarthritis, unspecified site: Secondary | ICD-10-CM | POA: Diagnosis not present

## 2023-01-10 DIAGNOSIS — Z96642 Presence of left artificial hip joint: Secondary | ICD-10-CM | POA: Diagnosis not present

## 2023-01-10 DIAGNOSIS — I700 Atherosclerosis of aorta: Secondary | ICD-10-CM | POA: Diagnosis not present

## 2023-01-10 DIAGNOSIS — E890 Postprocedural hypothyroidism: Secondary | ICD-10-CM | POA: Diagnosis not present

## 2023-01-10 DIAGNOSIS — S32592D Other specified fracture of left pubis, subsequent encounter for fracture with routine healing: Secondary | ICD-10-CM | POA: Diagnosis not present

## 2023-01-10 DIAGNOSIS — H3530 Unspecified macular degeneration: Secondary | ICD-10-CM | POA: Diagnosis not present

## 2023-01-10 DIAGNOSIS — Z7951 Long term (current) use of inhaled steroids: Secondary | ICD-10-CM | POA: Diagnosis not present

## 2023-01-10 DIAGNOSIS — I5032 Chronic diastolic (congestive) heart failure: Secondary | ICD-10-CM | POA: Diagnosis not present

## 2023-01-10 DIAGNOSIS — E785 Hyperlipidemia, unspecified: Secondary | ICD-10-CM | POA: Diagnosis not present

## 2023-01-10 DIAGNOSIS — Z9181 History of falling: Secondary | ICD-10-CM | POA: Diagnosis not present

## 2023-01-10 DIAGNOSIS — K219 Gastro-esophageal reflux disease without esophagitis: Secondary | ICD-10-CM | POA: Diagnosis not present

## 2023-01-12 DIAGNOSIS — I700 Atherosclerosis of aorta: Secondary | ICD-10-CM | POA: Diagnosis not present

## 2023-01-12 DIAGNOSIS — E890 Postprocedural hypothyroidism: Secondary | ICD-10-CM | POA: Diagnosis not present

## 2023-01-12 DIAGNOSIS — Z9181 History of falling: Secondary | ICD-10-CM | POA: Diagnosis not present

## 2023-01-12 DIAGNOSIS — Z96653 Presence of artificial knee joint, bilateral: Secondary | ICD-10-CM | POA: Diagnosis not present

## 2023-01-12 DIAGNOSIS — Z96642 Presence of left artificial hip joint: Secondary | ICD-10-CM | POA: Diagnosis not present

## 2023-01-12 DIAGNOSIS — E785 Hyperlipidemia, unspecified: Secondary | ICD-10-CM | POA: Diagnosis not present

## 2023-01-12 DIAGNOSIS — Z791 Long term (current) use of non-steroidal anti-inflammatories (NSAID): Secondary | ICD-10-CM | POA: Diagnosis not present

## 2023-01-12 DIAGNOSIS — I5032 Chronic diastolic (congestive) heart failure: Secondary | ICD-10-CM | POA: Diagnosis not present

## 2023-01-12 DIAGNOSIS — I110 Hypertensive heart disease with heart failure: Secondary | ICD-10-CM | POA: Diagnosis not present

## 2023-01-12 DIAGNOSIS — J45909 Unspecified asthma, uncomplicated: Secondary | ICD-10-CM | POA: Diagnosis not present

## 2023-01-12 DIAGNOSIS — M1990 Unspecified osteoarthritis, unspecified site: Secondary | ICD-10-CM | POA: Diagnosis not present

## 2023-01-12 DIAGNOSIS — H3530 Unspecified macular degeneration: Secondary | ICD-10-CM | POA: Diagnosis not present

## 2023-01-12 DIAGNOSIS — Z7951 Long term (current) use of inhaled steroids: Secondary | ICD-10-CM | POA: Diagnosis not present

## 2023-01-12 DIAGNOSIS — K219 Gastro-esophageal reflux disease without esophagitis: Secondary | ICD-10-CM | POA: Diagnosis not present

## 2023-01-12 DIAGNOSIS — S32592D Other specified fracture of left pubis, subsequent encounter for fracture with routine healing: Secondary | ICD-10-CM | POA: Diagnosis not present

## 2023-01-12 DIAGNOSIS — I10 Essential (primary) hypertension: Secondary | ICD-10-CM | POA: Diagnosis not present

## 2023-01-17 DIAGNOSIS — N390 Urinary tract infection, site not specified: Secondary | ICD-10-CM | POA: Diagnosis not present

## 2023-01-18 ENCOUNTER — Other Ambulatory Visit: Payer: Self-pay

## 2023-01-18 ENCOUNTER — Inpatient Hospital Stay (HOSPITAL_COMMUNITY)
Admission: EM | Admit: 2023-01-18 | Discharge: 2023-02-06 | DRG: 689 | Disposition: E | Payer: Medicare Other | Attending: Internal Medicine | Admitting: Internal Medicine

## 2023-01-18 ENCOUNTER — Emergency Department (HOSPITAL_COMMUNITY): Payer: Medicare Other

## 2023-01-18 ENCOUNTER — Encounter (HOSPITAL_COMMUNITY): Payer: Self-pay | Admitting: Emergency Medicine

## 2023-01-18 DIAGNOSIS — Z96653 Presence of artificial knee joint, bilateral: Secondary | ICD-10-CM | POA: Diagnosis present

## 2023-01-18 DIAGNOSIS — I499 Cardiac arrhythmia, unspecified: Secondary | ICD-10-CM | POA: Diagnosis not present

## 2023-01-18 DIAGNOSIS — M4800 Spinal stenosis, site unspecified: Secondary | ICD-10-CM | POA: Diagnosis present

## 2023-01-18 DIAGNOSIS — R296 Repeated falls: Secondary | ICD-10-CM | POA: Diagnosis present

## 2023-01-18 DIAGNOSIS — Z515 Encounter for palliative care: Secondary | ICD-10-CM | POA: Diagnosis not present

## 2023-01-18 DIAGNOSIS — R64 Cachexia: Secondary | ICD-10-CM | POA: Diagnosis present

## 2023-01-18 DIAGNOSIS — Z79890 Hormone replacement therapy (postmenopausal): Secondary | ICD-10-CM

## 2023-01-18 DIAGNOSIS — F339 Major depressive disorder, recurrent, unspecified: Secondary | ICD-10-CM | POA: Diagnosis present

## 2023-01-18 DIAGNOSIS — G9340 Encephalopathy, unspecified: Secondary | ICD-10-CM | POA: Diagnosis not present

## 2023-01-18 DIAGNOSIS — G928 Other toxic encephalopathy: Secondary | ICD-10-CM | POA: Diagnosis not present

## 2023-01-18 DIAGNOSIS — K219 Gastro-esophageal reflux disease without esophagitis: Secondary | ICD-10-CM

## 2023-01-18 DIAGNOSIS — E890 Postprocedural hypothyroidism: Secondary | ICD-10-CM | POA: Diagnosis not present

## 2023-01-18 DIAGNOSIS — I10 Essential (primary) hypertension: Secondary | ICD-10-CM | POA: Diagnosis present

## 2023-01-18 DIAGNOSIS — H547 Unspecified visual loss: Secondary | ICD-10-CM | POA: Diagnosis present

## 2023-01-18 DIAGNOSIS — R112 Nausea with vomiting, unspecified: Secondary | ICD-10-CM | POA: Diagnosis not present

## 2023-01-18 DIAGNOSIS — I5032 Chronic diastolic (congestive) heart failure: Secondary | ICD-10-CM | POA: Diagnosis present

## 2023-01-18 DIAGNOSIS — N390 Urinary tract infection, site not specified: Secondary | ICD-10-CM | POA: Diagnosis not present

## 2023-01-18 DIAGNOSIS — J45909 Unspecified asthma, uncomplicated: Secondary | ICD-10-CM | POA: Diagnosis not present

## 2023-01-18 DIAGNOSIS — Z7982 Long term (current) use of aspirin: Secondary | ICD-10-CM

## 2023-01-18 DIAGNOSIS — Z79899 Other long term (current) drug therapy: Secondary | ICD-10-CM

## 2023-01-18 DIAGNOSIS — I110 Hypertensive heart disease with heart failure: Secondary | ICD-10-CM | POA: Diagnosis not present

## 2023-01-18 DIAGNOSIS — T402X5A Adverse effect of other opioids, initial encounter: Secondary | ICD-10-CM | POA: Diagnosis present

## 2023-01-18 DIAGNOSIS — F0393 Unspecified dementia, unspecified severity, with mood disturbance: Secondary | ICD-10-CM | POA: Diagnosis not present

## 2023-01-18 DIAGNOSIS — J690 Pneumonitis due to inhalation of food and vomit: Secondary | ICD-10-CM | POA: Diagnosis present

## 2023-01-18 DIAGNOSIS — Z800 Family history of malignant neoplasm of digestive organs: Secondary | ICD-10-CM

## 2023-01-18 DIAGNOSIS — G9341 Metabolic encephalopathy: Secondary | ICD-10-CM | POA: Diagnosis not present

## 2023-01-18 DIAGNOSIS — J42 Unspecified chronic bronchitis: Secondary | ICD-10-CM | POA: Diagnosis present

## 2023-01-18 DIAGNOSIS — Z791 Long term (current) use of non-steroidal anti-inflammatories (NSAID): Secondary | ICD-10-CM

## 2023-01-18 DIAGNOSIS — Z6820 Body mass index (BMI) 20.0-20.9, adult: Secondary | ICD-10-CM

## 2023-01-18 DIAGNOSIS — M25462 Effusion, left knee: Secondary | ICD-10-CM | POA: Diagnosis not present

## 2023-01-18 DIAGNOSIS — E785 Hyperlipidemia, unspecified: Secondary | ICD-10-CM | POA: Diagnosis not present

## 2023-01-18 DIAGNOSIS — K449 Diaphragmatic hernia without obstruction or gangrene: Secondary | ICD-10-CM | POA: Diagnosis present

## 2023-01-18 DIAGNOSIS — Z96642 Presence of left artificial hip joint: Secondary | ICD-10-CM | POA: Diagnosis present

## 2023-01-18 DIAGNOSIS — N3000 Acute cystitis without hematuria: Secondary | ICD-10-CM | POA: Diagnosis not present

## 2023-01-18 DIAGNOSIS — T17908A Unspecified foreign body in respiratory tract, part unspecified causing other injury, initial encounter: Secondary | ICD-10-CM | POA: Diagnosis not present

## 2023-01-18 DIAGNOSIS — Z66 Do not resuscitate: Secondary | ICD-10-CM | POA: Diagnosis not present

## 2023-01-18 DIAGNOSIS — R410 Disorientation, unspecified: Secondary | ICD-10-CM | POA: Diagnosis present

## 2023-01-18 DIAGNOSIS — Z8673 Personal history of transient ischemic attack (TIA), and cerebral infarction without residual deficits: Secondary | ICD-10-CM

## 2023-01-18 LAB — URINALYSIS, ROUTINE W REFLEX MICROSCOPIC
Bilirubin Urine: NEGATIVE
Glucose, UA: NEGATIVE mg/dL
Ketones, ur: NEGATIVE mg/dL
Nitrite: NEGATIVE
Protein, ur: 30 mg/dL — AB
Specific Gravity, Urine: 1.009 (ref 1.005–1.030)
WBC, UA: >50 WBC/hpf (ref 0–5)
pH: 5 (ref 5.0–8.0)

## 2023-01-18 LAB — CBC
HCT: 42.5 % (ref 36.0–46.0)
Hemoglobin: 13.3 g/dL (ref 12.0–15.0)
MCH: 30.5 pg (ref 26.0–34.0)
MCHC: 31.3 g/dL (ref 30.0–36.0)
MCV: 97.5 fL (ref 80.0–100.0)
Platelets: 309 10*3/uL (ref 150–400)
RBC: 4.36 MIL/uL (ref 3.87–5.11)
RDW: 14.4 % (ref 11.5–15.5)
WBC: 12.9 10*3/uL — ABNORMAL HIGH (ref 4.0–10.5)
nRBC: 0 % (ref 0.0–0.2)

## 2023-01-18 LAB — BASIC METABOLIC PANEL
Anion gap: 9 (ref 5–15)
BUN: 18 mg/dL (ref 8–23)
CO2: 26 mmol/L (ref 22–32)
Calcium: 10.4 mg/dL — ABNORMAL HIGH (ref 8.9–10.3)
Chloride: 98 mmol/L (ref 98–111)
Creatinine, Ser: 1.14 mg/dL — ABNORMAL HIGH (ref 0.44–1.00)
GFR, Estimated: 45 mL/min — ABNORMAL LOW (ref >60)
Glucose, Bld: 114 mg/dL — ABNORMAL HIGH (ref 70–99)
Potassium: 5.1 mmol/L (ref 3.5–5.1)
Sodium: 133 mmol/L — ABNORMAL LOW (ref 135–145)

## 2023-01-18 LAB — TSH: TSH: 2.027 u[IU]/mL (ref 0.350–4.500)

## 2023-01-18 LAB — HEMOGLOBIN AND HEMATOCRIT, BLOOD
HCT: 44.1 % (ref 36.0–46.0)
Hemoglobin: 13.4 g/dL (ref 12.0–15.0)

## 2023-01-18 MED ORDER — AMLODIPINE BESYLATE 5 MG PO TABS
5.0000 mg | ORAL_TABLET | Freq: Every day | ORAL | Status: DC
  Administered 2023-01-18 – 2023-01-20 (×3): 5 mg via ORAL
  Filled 2023-01-18 (×3): qty 1

## 2023-01-18 MED ORDER — OXYCODONE HCL 5 MG PO TABS: 5.0000 mg | ORAL_TABLET | ORAL | Status: DC | PRN

## 2023-01-18 MED ORDER — PREDNISONE 5 MG PO TABS
5.0000 mg | ORAL_TABLET | Freq: Every day | ORAL | Status: DC
  Administered 2023-01-19: 5 mg via ORAL
  Filled 2023-01-18 (×2): qty 1

## 2023-01-18 MED ORDER — ACETAMINOPHEN 325 MG PO TABS
650.0000 mg | ORAL_TABLET | Freq: Four times a day (QID) | ORAL | Status: DC | PRN
  Administered 2023-01-20: 650 mg via ORAL
  Filled 2023-01-18: qty 2

## 2023-01-18 MED ORDER — MONTELUKAST SODIUM 10 MG PO TABS
10.0000 mg | ORAL_TABLET | Freq: Every day | ORAL | Status: DC
  Administered 2023-01-18 – 2023-01-19 (×2): 10 mg via ORAL
  Filled 2023-01-18 (×2): qty 1

## 2023-01-18 MED ORDER — LOSARTAN POTASSIUM 50 MG PO TABS
50.0000 mg | ORAL_TABLET | Freq: Every day | ORAL | Status: DC
  Administered 2023-01-18 – 2023-01-20 (×3): 50 mg via ORAL
  Filled 2023-01-18 (×3): qty 1

## 2023-01-18 MED ORDER — ONDANSETRON HCL 4 MG/2ML IJ SOLN
4.0000 mg | Freq: Four times a day (QID) | INTRAMUSCULAR | Status: DC | PRN
  Administered 2023-01-18 – 2023-01-20 (×4): 4 mg via INTRAVENOUS
  Filled 2023-01-18 (×4): qty 2

## 2023-01-18 MED ORDER — FAMOTIDINE 20 MG PO TABS
40.0000 mg | ORAL_TABLET | Freq: Every day | ORAL | Status: DC
  Administered 2023-01-18: 40 mg via ORAL
  Filled 2023-01-18: qty 2

## 2023-01-18 MED ORDER — ACETAMINOPHEN 650 MG RE SUPP: 650.0000 mg | Freq: Four times a day (QID) | RECTAL | Status: DC | PRN

## 2023-01-18 MED ORDER — LEVOTHYROXINE SODIUM 100 MCG PO TABS
100.0000 ug | ORAL_TABLET | Freq: Every day | ORAL | Status: DC
  Administered 2023-01-20: 100 ug via ORAL
  Filled 2023-01-18 (×2): qty 1

## 2023-01-18 MED ORDER — ROSUVASTATIN CALCIUM 10 MG PO TABS
10.0000 mg | ORAL_TABLET | Freq: Every day | ORAL | Status: DC
  Administered 2023-01-18 – 2023-01-19 (×2): 10 mg via ORAL
  Filled 2023-01-18 (×2): qty 1

## 2023-01-18 MED ORDER — SODIUM CHLORIDE 0.9 % IV SOLN
1.0000 g | Freq: Once | INTRAVENOUS | Status: AC
  Administered 2023-01-18: 1 g via INTRAVENOUS
  Filled 2023-01-18 (×2): qty 10

## 2023-01-18 MED ORDER — PANTOPRAZOLE SODIUM 40 MG PO TBEC
40.0000 mg | DELAYED_RELEASE_TABLET | Freq: Every day | ORAL | Status: DC
  Administered 2023-01-18 – 2023-01-19 (×2): 40 mg via ORAL
  Filled 2023-01-18 (×3): qty 1

## 2023-01-18 MED ORDER — HEPARIN SODIUM (PORCINE) 5000 UNIT/ML IJ SOLN
5000.0000 [IU] | Freq: Three times a day (TID) | INTRAMUSCULAR | Status: DC
  Administered 2023-01-18 – 2023-01-20 (×5): 5000 [IU] via SUBCUTANEOUS
  Filled 2023-01-18 (×5): qty 1

## 2023-01-18 MED ORDER — SERTRALINE HCL 50 MG PO TABS
50.0000 mg | ORAL_TABLET | Freq: Every day | ORAL | Status: DC
  Administered 2023-01-18 – 2023-01-20 (×3): 50 mg via ORAL
  Filled 2023-01-18 (×3): qty 1

## 2023-01-18 MED ORDER — ASPIRIN 81 MG PO CHEW
81.0000 mg | CHEWABLE_TABLET | Freq: Every day | ORAL | Status: DC
  Administered 2023-01-18 – 2023-01-20 (×3): 81 mg via ORAL
  Filled 2023-01-18 (×3): qty 1

## 2023-01-18 MED ORDER — ONDANSETRON HCL 4 MG PO TABS: 4.0000 mg | ORAL_TABLET | Freq: Four times a day (QID) | ORAL | Status: DC | PRN

## 2023-01-18 NOTE — ED Triage Notes (Signed)
Pt is a hospice pt and was sent over to be treated for uti. Poa terminated hospice so pt could be treated here. POA also would like fluid removed from left knee.

## 2023-01-18 NOTE — ED Notes (Signed)
ED TO INPATIENT HANDOFF REPORT  ED Nurse Name and Phone #:   S Name/Age/Gender Lindsey Butler 89 y.o. female Room/Bed: APA04/APA04  Code Status   Code Status: Prior  Home/SNF/Other Home  Is this baseline? Yes   Triage Complete: Triage complete  Chief Complaint Acute metabolic encephalopathy [G93.41]  Triage Note Pt is a hospice pt and was sent over to be treated for uti. Poa terminated hospice so pt could be treated here. POA also would like fluid removed from left knee.   Allergies No Known Allergies  Level of Care/Admitting Diagnosis ED Disposition     ED Disposition  Admit   Condition  --   Comment  Hospital Area: [facility] [[id number]]  Level of Care: Med-Surg [16]  Covid Evaluation: Asymptomatic - no recent exposure (last 10 days) testing not required  Diagnosis: Acute metabolic encephalopathy [[id number]]  Admitting Physician: Lilyan Gilford [[id number]]  Attending Physician: Lilyan Gilford [[id number]]          B Medical/Surgery History Past Medical History:  Diagnosis Date   Actinic keratosis    Asthma    GERD (gastroesophageal reflux disease)    Hyperlipidemia    Hypothyroidism    Osteoarthritis    Past Surgical History:  Procedure Laterality Date   CATARACT EXTRACTION     left and right   HIP ARTHROPLASTY Left 10/04/2019   Procedure: ARTHROPLASTY BIPOLAR HIP (HEMIARTHROPLASTY);  Surgeon: Vickki Hearing, MD;  Location: [facility];  Service: Orthopedics;  Laterality: Left;   THYROIDECTOMY     TONSILLECTOMY     TOTAL KNEE ARTHROPLASTY     right and left knee   VAGINAL HYSTERECTOMY       A IV Location/Drains/Wounds Patient Lines/Drains/Airways Status     Active Line/Drains/Airways     Name Placement date Placement time Site Days   Peripheral IV 01/12/2023 20 G 1" Left Antecubital 01/10/2023  1729  Antecubital  less than 1   Wound / Incision (Open or Dehisced) 10/04/19 Arm Anterior;Left;Lower Skin Tear 10/04/19  1730  Arm   1202   Wound / Incision (Open or Dehisced) 10/04/19 Other (Comment) Arm Lower;Posterior;Right 10/04/19  1930  Arm  1202   Wound / Incision (Open or Dehisced) 03/02/21 (MASD) Moisture Associated Skin Damage Sacrum Circumferential Redness 03/02/21  2007  Sacrum  687            Intake/Output Last 24 hours No intake or output data in the 24 hours ending 02/02/2023 1933  Labs/Imaging Results for orders placed or performed during the hospital encounter of 01/19/2023 (from the past 48 hour(s))  CBC     Status: Abnormal   Collection Time: 01/30/2023  5:18 PM  Result Value Ref Range   WBC 12.9 (H) 4.0 - 10.5 K/uL   RBC 4.36 3.87 - 5.11 MIL/uL   Hemoglobin 13.3 12.0 - 15.0 g/dL   HCT 95.6 21.3 - 08.6 %   MCV 97.5 80.0 - 100.0 fL   MCH 30.5 26.0 - 34.0 pg   MCHC 31.3 30.0 - 36.0 g/dL   RDW 57.8 46.9 - 62.9 %   Platelets 309 150 - 400 K/uL   nRBC 0.0 0.0 - 0.2 %    Comment: Performed at [facility], [address], [place], [place] [address]  Basic metabolic panel     Status: Abnormal   Collection Time: 02/03/2023  5:18 PM  Result Value Ref Range   Sodium 133 (L) 135 - 145 mmol/L   Potassium 5.1 3.5 -  5.1 mmol/L   Chloride 98 98 - 111 mmol/L   CO2 26 22 - 32 mmol/L   Glucose, Bld 114 (H) 70 - 99 mg/dL    Comment: Glucose reference range applies only to samples taken after fasting for at least 8 hours.   BUN 18 8 - 23 mg/dL   Creatinine, Ser 7.82 (H) 0.44 - 1.00 mg/dL   Calcium 95.6 (H) 8.9 - 10.3 mg/dL   GFR, Estimated 45 (L) >60 mL/min    Comment: (NOTE) Calculated using the CKD-EPI Creatinine Equation (2021)    Anion gap 9 5 - 15    Comment: Performed at [facility], [address] [place], [place] [address]  Urinalysis, Routine w reflex microscopic -Urine, Clean Catch     Status: Abnormal   Collection Time: 01/15/2023  6:20 PM  Result Value Ref Range   Color, Urine YELLOW YELLOW   APPearance CLOUDY (A) CLEAR   Specific Gravity, Urine 1.009 1.005 - 1.030   pH 5.0 5.0 - 8.0    Glucose, UA NEGATIVE NEGATIVE mg/dL   Hgb urine dipstick SMALL (A) NEGATIVE   Bilirubin Urine NEGATIVE NEGATIVE   Ketones, ur NEGATIVE NEGATIVE mg/dL   Protein, ur 30 (A) NEGATIVE mg/dL   Nitrite NEGATIVE NEGATIVE   Leukocytes,Ua LARGE (A) NEGATIVE   RBC / HPF 0-5 0 - 5 RBC/hpf   WBC, UA >50 0 - 5 WBC/hpf   Bacteria, UA FEW (A) NONE SEEN   Squamous Epithelial / HPF 0-5 0 - 5 /HPF    Comment: Performed at [facility], [address] [place], [place] [address]   DG Chest Portable 1 View  Result Date: 01/26/2023 CLINICAL DATA:  Aspiration. EXAM: PORTABLE CHEST 1 VIEW COMPARISON:  12/02/2022 FINDINGS: Large esophageal hiatal hernia. Heart size and pulmonary vascularity are normal. Central interstitial changes in the lungs likely representing chronic bronchitis. No developing consolidation or airspace disease. No pleural effusions. No pneumothorax. Old rib fractures. Degenerative changes in the spine and shoulder. Postoperative changes in the lumbar spine. IMPRESSION: Large esophageal hiatal hernia. Chronic bronchitic changes in the lungs. No focal consolidation. Electronically Signed   By: Burman Nieves M.D.   On: 02/04/2023 18:15   DG Knee Complete 4 Views Left  Result Date: 01/23/2023 CLINICAL DATA:  Knee effusion. EXAM: LEFT KNEE - COMPLETE 4+ VIEW COMPARISON:  10/03/2019 FINDINGS: Left knee arthroplasty. Vascular calcifications. Moderate to large suprapatellar joint effusion, new. IMPRESSION: Left knee arthroplasty, with new suprapatellar joint effusion. Electronically Signed   By: Jeronimo Greaves M.D.   On: 01/11/2023 18:15    Pending Labs Unresulted Labs (From admission, onward)    None       Vitals/Pain Today's Vitals   01/19/2023 1617 01/31/2023 1620 01/07/2023 1830  BP:  107/71 137/81  Pulse:  91 100  Resp:  18 18  Temp:  (!) 97.4 F (36.3 C)   TempSrc:  Oral   SpO2:  93% 99%  Weight: 127 lb 13.9 oz (58 kg)    Height: 5\' 6"  (1.676 m)      Isolation Precautions No active  isolations  Medications Medications  cefTRIAXone (ROCEPHIN) 1 g in sodium chloride 0.9 % 100 mL IVPB (0 g Intravenous Stopped 01/25/2023 1811)    Mobility non-ambulatory     Focused Assessments    R Recommendations: See Admitting Provider Note  Report given to:   Additional Notes:

## 2023-01-18 NOTE — Progress Notes (Signed)
   01/18/23 2102  Assess: MEWS Score  Temp 97.8 F (36.6 C)  BP (!) 163/75  MAP (mmHg) 94  Pulse Rate (!) 116  Resp 20  Level of Consciousness Alert  SpO2 98 %  O2 Device Room Air  Assess: MEWS Score  MEWS Temp 0  MEWS Systolic 0  MEWS Pulse 2  MEWS RR 0  MEWS LOC 0  MEWS Score 2  MEWS Score Color Yellow  Assess: if the MEWS score is Yellow or Red  Were vital signs taken at a resting state? Yes  Focused Assessment No change from prior assessment  Does the patient meet 2 or more of the SIRS criteria? No  MEWS guidelines implemented  Yes, yellow  Treat  MEWS Interventions Considered administering scheduled or prn medications/treatments as ordered  Take Vital Signs  Increase Vital Sign Frequency  Yellow: Q2hr x1, continue Q4hrs until patient remains green for 12hrs  Escalate  MEWS: Escalate Yellow: Discuss with charge nurse and consider notifying provider and/or RRT  Notify: Charge Nurse/RN  Name of Charge Nurse/RN Notified [facility]  Provider Notification  Provider Name/Title Zierle-ghosh  Date Provider Notified 01/18/23  Time Provider Notified 2104  Method of Notification Page  Notification Reason Change in status  Provider response See new orders  Date of Provider Response 01/18/23  Time of Provider Response 2108  Assess: SIRS CRITERIA  SIRS Temperature  0  SIRS Pulse 1  SIRS Respirations  0  SIRS WBC 0  SIRS Score Sum  1   Pt arrived to unit after vomiting. Charge nurse states it was dark and coffee ground in nature. Pt HR also elevated. Provider notified. Labs ordered. Pt now resting.

## 2023-01-18 NOTE — ED Notes (Signed)
This NT attempted to in and out cath the pt, This NT was unable to collect any urine

## 2023-01-18 NOTE — ED Provider Notes (Signed)
I provided a substantive portion of the care of this patient.  I personally made/approved the management plan for this patient and take responsibility for the patient management.     Patient seen by me along with physician assistant.  Patient is a DNR.  Patient was sent in for concerns for possible urinary tract infection does appear to have a urinary tract infection.  Patient also seems to have some altered mental status could be secondary to urinary tract infection patient requiring 2 L of oxygen.  Patient states she is not normally on oxygen but the facility seems to have told the daytime nurse that normally is on oxygen.  Patient's white blood cell count is 12.9.  Think for urinary tract infection and altered mental status she will require medical admission.  Patient moving all 4 extremities.  Urine has large leukocytes greater than 50 whites few bacteria the's sodium is 133 potassium 5.1 GFR 45 glucose 114 creatinine 1.14.  Patient also some swelling to the left knee.  Patient's had a knee arthroplasty with new suprapatellar joint effusion.  Checks x-ray shows large esophageal hiatal hernia chronic rhonchi changes no focal consolidations.   Vanetta Mulders, MD 01/18/23 1925

## 2023-01-18 NOTE — Plan of Care (Signed)
  Problem: Coping: Goal: Will verbalize positive feelings about self Outcome: Progressing Goal: Will identify appropriate support needs Outcome: Progressing   Problem: Self-Care: Goal: Ability to participate in self-care as condition permits will improve Outcome: Progressing   Problem: Education: Goal: Knowledge of General Education information will improve Description: Including pain rating scale, medication(s)/side effects and non-pharmacologic comfort measures Outcome: Progressing   Problem: Health Behavior/Discharge Planning: Goal: Ability to manage health-related needs will improve Outcome: Progressing

## 2023-01-18 NOTE — ED Provider Notes (Signed)
[place] EMERGENCY DEPARTMENT AT [facility] Provider Note   CSN: [id number] Arrival date & time: 2023/02/08  1613     History  Chief Complaint  Patient presents with   Urinary Frequency    Lindsey Butler is a 89 y.o. female with past medical history significant for asthma, hypertension, GERD, spinal stenosis, advanced age, previous TIAs who presents from her hospice care, her daughter-in-law power of attorney was concerned because she had a dipstick confirmed urinary tract infection at least 3 to 4 days ago, but has not received any antibiotics at this time.  She also reported concern for possible aspiration event earlier today, she reports that she has been having increased falls, weakness, notes an effusion of the left knee.  She reports that she is normally oriented, can participate in her own care, walks with a walker but has been unable to do so, and is increasingly confused.  She is not normally on oxygen but has been on oxygen for comfort over the last 2 days.   Urinary Frequency       Home Medications Prior to Admission medications   Medication Sig Start Date End Date Taking? Authorizing Provider  acetaminophen (TYLENOL) 500 MG tablet Take 2 tablets (1,000 mg total) by mouth every 8 (eight) hours. 09/09/22   Vassie Loll, MD  albuterol (VENTOLIN HFA) 108 (90 Base) MCG/ACT inhaler Inhale 2 puffs into the lungs every 6 (six) hours as needed for wheezing or shortness of breath. 01/06/23   Ghimire, Werner Lean, MD  amLODipine (NORVASC) 5 MG tablet Take 1 tablet (5 mg total) by mouth daily. 01/07/23   Ghimire, Werner Lean, MD  aspirin 81 MG chewable tablet Chew 1 tablet (81 mg total) by mouth daily. 01/07/23   Ghimire, Werner Lean, MD  calcium carbonate (OSCAL) 1500 (600 Ca) MG TABS tablet Take 1,500 mg by mouth daily.    [provider]  cholecalciferol (VITAMIN D) 25 MCG (1000 UNIT) tablet Take 1,000 Units by mouth daily. 11/07/20   [provider]  famotidine  (PEPCID) 40 MG tablet Take 40 mg by mouth at bedtime.    [provider]  lactobacillus acidophilus (BACID) TABS tablet Take 1 tablet by mouth 3 (three) times daily.    [provider]  levothyroxine (SYNTHROID) 100 MCG tablet Take 100 mcg by mouth daily. 08/19/22   [provider]  losartan (COZAAR) 50 MG tablet Take 50 mg by mouth daily. 08/19/22   [provider]  meloxicam (MOBIC) 15 MG tablet Take 15 mg by mouth daily.    [provider]  montelukast (SINGULAIR) 10 MG tablet Take 1 tablet (10 mg total) by mouth at bedtime. 11/01/19   Sharee Holster, NP  omeprazole (PRILOSEC) 40 MG capsule Take 40 mg by mouth daily.    [provider]  predniSONE (DELTASONE) 5 MG tablet Take 5 mg by mouth daily. 08/19/22   [provider]  rosuvastatin (CRESTOR) 10 MG tablet Take 10 mg by mouth at bedtime. 08/19/22   [provider]  senna (SENOKOT) 8.6 MG TABS tablet Take 1 tablet (8.6 mg total) by mouth 2 (two) times daily. Hold for diarrhea. 09/09/22   Vassie Loll, MD  sertraline (ZOLOFT) 50 MG tablet Take 50 mg by mouth daily. 08/19/22   [provider]  vitamin B-12 (CYANOCOBALAMIN) 1000 MCG tablet Take 1,000 mcg by mouth daily. 02/24/21   [provider]      Allergies    Patient has no known  allergies.    Review of Systems   Review of Systems  Genitourinary:  Positive for frequency.  All other systems reviewed and are negative.   Physical Exam Updated Vital Signs BP 126/80 (BP Location: Right Arm)   Pulse 96   Temp 98.3 F (36.8 C) (Oral)   Resp 20   Ht 5\' 6"  (1.676 m)   Wt 58 kg   SpO2 100%   BMI 20.64 kg/m  Physical Exam Vitals and nursing note reviewed.  Constitutional:      General: She is not in acute distress.    Appearance: Normal appearance.  HENT:     Head: Normocephalic and atraumatic.  Eyes:     General:        Right eye: No discharge.        Left eye: No discharge.   Cardiovascular:     Rate and Rhythm: Regular rhythm. Tachycardia present.     Heart sounds: No murmur heard.    No friction rub. No gallop.     Comments: Patient mildly tachycardic on arrival with normal rhythm. Pulmonary:     Effort: Pulmonary effort is normal.     Breath sounds: Normal breath sounds.     Comments: No wheezing, rhonchi, stridor, rales on my exam Abdominal:     General: Bowel sounds are normal.     Palpations: Abdomen is soft.  Musculoskeletal:     Comments: Patient with significant effusion of the left knee, some bruising at the knee as well, effusion located suprapatellar, she does have normal range of motion of the knee with some pain.  No evidence of step-off, deformity.  No overlying skin excoriation, redness.  Skin:    General: Skin is warm and dry.     Capillary Refill: Capillary refill takes less than 2 seconds.  Neurological:     Mental Status: She is alert.     Comments: Patient is oriented to self but not to time, place, situation, reportedly different from her baseline  Psychiatric:        Mood and Affect: Mood normal.     ED Results / Procedures / Treatments   Labs (all labs ordered are listed, but only abnormal results are displayed) Labs Reviewed  URINALYSIS, ROUTINE W REFLEX MICROSCOPIC - Abnormal; Notable for the following components:      Result Value   APPearance CLOUDY (*)    Hgb urine dipstick SMALL (*)    Protein, ur 30 (*)    Leukocytes,Ua LARGE (*)    Bacteria, UA FEW (*)    All other components within normal limits  CBC - Abnormal; Notable for the following components:   WBC 12.9 (*)    All other components within normal limits  BASIC METABOLIC PANEL - Abnormal; Notable for the following components:   Sodium 133 (*)    Glucose, Bld 114 (*)    Creatinine, Ser 1.14 (*)    Calcium 10.4 (*)    GFR, Estimated 45 (*)    All other components within normal limits  COMPREHENSIVE METABOLIC PANEL - Abnormal; Notable for the following  components:   Sodium 134 (*)    Glucose, Bld 148 (*)    Albumin 3.2 (*)    AST 11 (*)    GFR, Estimated 55 (*)    All other components within normal limits  CBC WITH DIFFERENTIAL/PLATELET - Abnormal; Notable for the following components:   WBC 19.7 (*)    Neutro Abs 17.9 (*)    Lymphs Abs  0.4 (*)    Monocytes Absolute 1.1 (*)    Abs Immature Granulocytes 0.11 (*)    All other components within normal limits  URINE CULTURE  MAGNESIUM  TSH  HEMOGLOBIN AND HEMATOCRIT, BLOOD    EKG None  Radiology DG Chest Portable 1 View  Result Date: 01/10/2023 CLINICAL DATA:  Aspiration. EXAM: PORTABLE CHEST 1 VIEW COMPARISON:  12/02/2022 FINDINGS: Large esophageal hiatal hernia. Heart size and pulmonary vascularity are normal. Central interstitial changes in the lungs likely representing chronic bronchitis. No developing consolidation or airspace disease. No pleural effusions. No pneumothorax. Old rib fractures. Degenerative changes in the spine and shoulder. Postoperative changes in the lumbar spine. IMPRESSION: Large esophageal hiatal hernia. Chronic bronchitic changes in the lungs. No focal consolidation. Electronically Signed   By: Burman Nieves M.D.   On: 01/29/2023 18:15   DG Knee Complete 4 Views Left  Result Date: 01/16/2023 CLINICAL DATA:  Knee effusion. EXAM: LEFT KNEE - COMPLETE 4+ VIEW COMPARISON:  10/03/2019 FINDINGS: Left knee arthroplasty. Vascular calcifications. Moderate to large suprapatellar joint effusion, new. IMPRESSION: Left knee arthroplasty, with new suprapatellar joint effusion. Electronically Signed   By: Jeronimo Greaves M.D.   On: 01/09/2023 18:15    Procedures Procedures    Medications Ordered in ED Medications  aspirin chewable tablet 81 mg (81 mg Oral Given 01/19/23 0841)  amLODipine (NORVASC) tablet 5 mg (5 mg Oral Given 01/19/23 0842)  losartan (COZAAR) tablet 50 mg (50 mg Oral Given 01/19/23 0841)  rosuvastatin (CRESTOR) tablet 10 mg (10 mg Oral Given 01/09/2023  2327)  sertraline (ZOLOFT) tablet 50 mg (50 mg Oral Given 01/19/23 0842)  levothyroxine (SYNTHROID) tablet 100 mcg (100 mcg Oral Patient Refused/Not Given 01/19/23 0629)  famotidine (PEPCID) tablet 40 mg (40 mg Oral Given 01/09/2023 2328)  pantoprazole (PROTONIX) EC tablet 40 mg (40 mg Oral Given 01/19/23 0842)  montelukast (SINGULAIR) tablet 10 mg (10 mg Oral Given 01/14/2023 2327)  heparin injection 5,000 Units (5,000 Units Subcutaneous Given 01/19/23 0629)  acetaminophen (TYLENOL) tablet 650 mg (has no administration in time range)    Or  acetaminophen (TYLENOL) suppository 650 mg (has no administration in time range)  oxyCODONE (Oxy IR/ROXICODONE) immediate release tablet 5 mg (has no administration in time range)  ondansetron (ZOFRAN) tablet 4 mg ( Oral See Alternative 01/19/23 4098)    Or  ondansetron (ZOFRAN) injection 4 mg (4 mg Intravenous Given 01/19/23 0838)  predniSONE (DELTASONE) tablet 5 mg (5 mg Oral Given 01/19/23 0841)  0.9 %  sodium chloride infusion (has no administration in time range)  cefTRIAXone (ROCEPHIN) 2 g in sodium chloride 0.9 % 100 mL IVPB (has no administration in time range)  cefTRIAXone (ROCEPHIN) 1 g in sodium chloride 0.9 % 100 mL IVPB (0 g Intravenous Stopped 01/11/2023 1811)    ED Course/ Medical Decision Making/ A&P                             Medical Decision Making Amount and/or Complexity of Data Reviewed Labs: ordered. Radiology: ordered.  Risk Decision regarding hospitalization.   This patient is a 89 y.o. female who presents to the ED for concern of ams, concern for UTI, more frequent falls, this involves an extensive number of treatment options, and is a complaint that carries with it a high risk of complications and morbidity. The emergent differential diagnosis prior to evaluation includes, but is not limited to, urinary tract infection, kidney stone, pyelonephritis, altered mental status  secondary to medication, advanced age, dementia, versus for  urinary tract infection, acute fracture, dislocation of the left knee, intracranial abnormality causing her to fall, versus other. This is not an exhaustive differential.   Past Medical History / Co-morbidities / Social History: asthma, hypertension, GERD, spinal stenosis, advanced age, previous TIAs  Additional history: Chart reviewed. Pertinent results include: Reviewed lab work, imaging from patient's recent emergency department visits, recent hospital admission  Physical Exam: Physical exam performed. The pertinent findings include: Patient does have a large suprapatellar effusion of the left knee.  She is alert and oriented to self but not time, place, situation.  She does move all 4 limbs spontaneously, no focal neurologic deficits otherwise noted.  She has no significant tenderness to palpation of the abdomen.  No wheezing, rhonchi, stridor, rales, she had been placed on oxygen for comfort, but does not seem to be in any respiratory distress.  Lab Tests: I ordered, and personally interpreted labs.  The pertinent results include: BMP notable for mild hypokalemia, sodium 133. Creatinine stable compared to baseline.  CBC notable for mild leukocytosis, white blood cells 12.9.  Her urinalysis is consistent with urinary tract infection with large leukocytes, greater than 50 white blood cells and bacteria.   Imaging Studies: I ordered imaging studies including plain film chest x-ray, plain film of knee,. I independently visualized and interpreted imaging which showed large hiatal hernia, no other evidence of acute intrathoracic abnormality.  X-ray of the left knee shows a suprapatellar effusion with no fracture, dislocation.. I agree with the radiologist interpretation.   Medications: I ordered medication including Rocephin for urinary tract infection.  Patient continues to have some alteration of her normal mental status per her power of attorney, along with her advanced age, concern for possible  aspiration pneumonia after aspiration event earlier in the day further evaluation in the hospital for return to normal mental status seems warranted.   Consultations Obtained: I requested consultation with the hospitalist, spoke with Dr. Victorino Dike,  and discussed lab and imaging findings as well as pertinent plan - they recommend: Admission for acute metabolic encephalopathy, urinary tract infection   Disposition: After consideration of the diagnostic results and the patients response to treatment, I feel that patient would benefit from admission .   I discussed this case with my attending physician Dr. Deretha Emory who cosigned this note including patient's presenting symptoms, physical exam, and planned diagnostics and interventions. Attending physician stated agreement with plan or made changes to plan which were implemented.    Final Clinical Impression(s) / ED Diagnoses Final diagnoses:  Lower urinary tract infectious disease  Metabolic encephalopathy    Rx / DC Orders ED Discharge Orders     None         [place] 01/19/23 1003    Rondel Baton, MD 01/19/23 1219

## 2023-01-19 DIAGNOSIS — J42 Unspecified chronic bronchitis: Secondary | ICD-10-CM | POA: Diagnosis present

## 2023-01-19 DIAGNOSIS — G9341 Metabolic encephalopathy: Secondary | ICD-10-CM

## 2023-01-19 DIAGNOSIS — F339 Major depressive disorder, recurrent, unspecified: Secondary | ICD-10-CM

## 2023-01-19 DIAGNOSIS — R410 Disorientation, unspecified: Secondary | ICD-10-CM | POA: Diagnosis present

## 2023-01-19 DIAGNOSIS — Z96642 Presence of left artificial hip joint: Secondary | ICD-10-CM | POA: Diagnosis present

## 2023-01-19 DIAGNOSIS — G928 Other toxic encephalopathy: Secondary | ICD-10-CM | POA: Diagnosis present

## 2023-01-19 DIAGNOSIS — J690 Pneumonitis due to inhalation of food and vomit: Secondary | ICD-10-CM | POA: Diagnosis present

## 2023-01-19 DIAGNOSIS — H547 Unspecified visual loss: Secondary | ICD-10-CM | POA: Diagnosis present

## 2023-01-19 DIAGNOSIS — K449 Diaphragmatic hernia without obstruction or gangrene: Secondary | ICD-10-CM | POA: Diagnosis present

## 2023-01-19 DIAGNOSIS — E890 Postprocedural hypothyroidism: Secondary | ICD-10-CM | POA: Diagnosis present

## 2023-01-19 DIAGNOSIS — Z515 Encounter for palliative care: Secondary | ICD-10-CM | POA: Diagnosis not present

## 2023-01-19 DIAGNOSIS — M25462 Effusion, left knee: Secondary | ICD-10-CM | POA: Diagnosis present

## 2023-01-19 DIAGNOSIS — I110 Hypertensive heart disease with heart failure: Secondary | ICD-10-CM | POA: Diagnosis present

## 2023-01-19 DIAGNOSIS — I5032 Chronic diastolic (congestive) heart failure: Secondary | ICD-10-CM | POA: Diagnosis present

## 2023-01-19 DIAGNOSIS — Z96653 Presence of artificial knee joint, bilateral: Secondary | ICD-10-CM | POA: Diagnosis present

## 2023-01-19 DIAGNOSIS — R296 Repeated falls: Secondary | ICD-10-CM | POA: Diagnosis present

## 2023-01-19 DIAGNOSIS — E785 Hyperlipidemia, unspecified: Secondary | ICD-10-CM | POA: Diagnosis present

## 2023-01-19 DIAGNOSIS — N3000 Acute cystitis without hematuria: Secondary | ICD-10-CM

## 2023-01-19 DIAGNOSIS — K219 Gastro-esophageal reflux disease without esophagitis: Secondary | ICD-10-CM

## 2023-01-19 DIAGNOSIS — R64 Cachexia: Secondary | ICD-10-CM | POA: Diagnosis present

## 2023-01-19 DIAGNOSIS — J45909 Unspecified asthma, uncomplicated: Secondary | ICD-10-CM | POA: Diagnosis present

## 2023-01-19 DIAGNOSIS — F0393 Unspecified dementia, unspecified severity, with mood disturbance: Secondary | ICD-10-CM | POA: Diagnosis present

## 2023-01-19 DIAGNOSIS — M4800 Spinal stenosis, site unspecified: Secondary | ICD-10-CM | POA: Diagnosis present

## 2023-01-19 DIAGNOSIS — N390 Urinary tract infection, site not specified: Secondary | ICD-10-CM

## 2023-01-19 DIAGNOSIS — I10 Essential (primary) hypertension: Secondary | ICD-10-CM | POA: Diagnosis not present

## 2023-01-19 DIAGNOSIS — I499 Cardiac arrhythmia, unspecified: Secondary | ICD-10-CM | POA: Diagnosis not present

## 2023-01-19 DIAGNOSIS — Z66 Do not resuscitate: Secondary | ICD-10-CM | POA: Diagnosis present

## 2023-01-19 LAB — COMPREHENSIVE METABOLIC PANEL
ALT: 13 U/L (ref 0–44)
AST: 11 U/L — ABNORMAL LOW (ref 15–41)
Albumin: 3.2 g/dL — ABNORMAL LOW (ref 3.5–5.0)
Alkaline Phosphatase: 106 U/L (ref 38–126)
Anion gap: 11 (ref 5–15)
BUN: 23 mg/dL (ref 8–23)
CO2: 24 mmol/L (ref 22–32)
Calcium: 10.3 mg/dL (ref 8.9–10.3)
Chloride: 99 mmol/L (ref 98–111)
Creatinine, Ser: 0.97 mg/dL (ref 0.44–1.00)
GFR, Estimated: 55 mL/min — ABNORMAL LOW (ref >60)
Glucose, Bld: 148 mg/dL — ABNORMAL HIGH (ref 70–99)
Potassium: 5 mmol/L (ref 3.5–5.1)
Sodium: 134 mmol/L — ABNORMAL LOW (ref 135–145)
Total Bilirubin: 0.8 mg/dL (ref 0.3–1.2)
Total Protein: 6.8 g/dL (ref 6.5–8.1)

## 2023-01-19 LAB — CBC WITH DIFFERENTIAL/PLATELET
Abs Immature Granulocytes: 0.11 10*3/uL — ABNORMAL HIGH (ref 0.00–0.07)
Basophils Absolute: 0.1 10*3/uL (ref 0.0–0.1)
Basophils Relative: 0 %
Eosinophils Absolute: 0.2 10*3/uL (ref 0.0–0.5)
Eosinophils Relative: 1 %
HCT: 41.5 % (ref 36.0–46.0)
Hemoglobin: 13 g/dL (ref 12.0–15.0)
Immature Granulocytes: 1 %
Lymphocytes Relative: 2 %
Lymphs Abs: 0.4 10*3/uL — ABNORMAL LOW (ref 0.7–4.0)
MCH: 30.3 pg (ref 26.0–34.0)
MCHC: 31.3 g/dL (ref 30.0–36.0)
MCV: 96.7 fL (ref 80.0–100.0)
Monocytes Absolute: 1.1 10*3/uL — ABNORMAL HIGH (ref 0.1–1.0)
Monocytes Relative: 6 %
Neutro Abs: 17.9 10*3/uL — ABNORMAL HIGH (ref 1.7–7.7)
Neutrophils Relative %: 90 %
Platelets: 367 10*3/uL (ref 150–400)
RBC: 4.29 MIL/uL (ref 3.87–5.11)
RDW: 14.5 % (ref 11.5–15.5)
WBC: 19.7 10*3/uL — ABNORMAL HIGH (ref 4.0–10.5)
nRBC: 0 % (ref 0.0–0.2)

## 2023-01-19 LAB — URIC ACID: Uric Acid, Serum: 5.2 mg/dL (ref 2.5–7.1)

## 2023-01-19 LAB — MAGNESIUM: Magnesium: 2.1 mg/dL (ref 1.7–2.4)

## 2023-01-19 MED ORDER — SODIUM CHLORIDE 0.9 % IV SOLN: INTRAVENOUS | Status: DC

## 2023-01-19 MED ORDER — FAMOTIDINE 20 MG PO TABS
20.0000 mg | ORAL_TABLET | Freq: Every day | ORAL | Status: DC
  Administered 2023-01-19: 20 mg via ORAL
  Filled 2023-01-19: qty 1

## 2023-01-19 MED ORDER — ACETAMINOPHEN 500 MG PO TABS
500.0000 mg | ORAL_TABLET | Freq: Three times a day (TID) | ORAL | Status: DC
  Administered 2023-01-19 – 2023-01-20 (×3): 500 mg via ORAL
  Filled 2023-01-19 (×3): qty 1

## 2023-01-19 MED ORDER — SODIUM CHLORIDE 0.9 % IV SOLN: 1.0000 g | INTRAVENOUS | Status: DC

## 2023-01-19 MED ORDER — SODIUM CHLORIDE 0.9 % IV SOLN
2.0000 g | INTRAVENOUS | Status: DC
  Administered 2023-01-19: 2 g via INTRAVENOUS
  Filled 2023-01-19: qty 20

## 2023-01-19 MED ORDER — DICLOFENAC SODIUM 1 % EX GEL
2.0000 g | Freq: Four times a day (QID) | CUTANEOUS | Status: DC
  Administered 2023-01-19 – 2023-01-20 (×4): 2 g via TOPICAL
  Filled 2023-01-19: qty 100

## 2023-01-19 NOTE — Assessment & Plan Note (Signed)
Continue Norvasc and losartan 

## 2023-01-19 NOTE — Assessment & Plan Note (Addendum)
Continue PPI ?

## 2023-01-19 NOTE — Assessment & Plan Note (Addendum)
-   Likely related to UTI - Continue treatment of UTI - Chest x-ray shows large hiatal hernia with chronic bronchitic changes and no focal consolidation - There was concern for an aspiration event, so patient's white blood cell count should trend up, or if patient should develop a cough, may be worth checking a chest x-ray again - TSH 2.027 - Unclear baseline - Continue to monitor

## 2023-01-19 NOTE — Assessment & Plan Note (Signed)
-   Continue Synthroid - TSH 2.027 - Continue to monitor

## 2023-01-19 NOTE — Progress Notes (Addendum)
PROGRESS NOTE    Lindsey Butler  ZOX:[id number] DOB: 1929-08-05 DOA: 01/22/2023 PCP: Irven Baltimore, NP   Brief Narrative: 89 year old with past medical history significant for asthma, GERD, hyperlipidemia, hypothyroidism, dementia presents to the ED complaining of increased urinary frequency and concern for UTI. Patient also complaining of left knee pain.    Assessment & Plan:   Principal Problem:   Acute metabolic encephalopathy Active Problems:   Post-operative hypothyroidism   Essential hypertension   Major depression, recurrent, chronic (HCC)   Hiatal hernia with GERD without esophagitis   Hyperlipidemia LDL goal <130   UTI (urinary tract infection)   1-Urinary tract infection: Patient present with some confusion per report, also urine showed more than 50 white blood cell. Continue with IV ceftriaxone Follow urine culture.  Acute metabolic/toxic  encephalopathy: in setting of infection and medications.  Per family patient became more confused after she was started on morphine by hospice.  Plan to hold opioids.  Tylenol for pain.   Hyperlipidemia: Continue with crestor.   Knee pain, effusion;  Xray: Left knee arthroplasty, with new suprapatellar joint effusion. Started Voltaren gel.  Recent fall.  Uric acid normal.  Ortho consulted, Dr Romeo Apple.   Hiatal  hernia: PPI,  Aspiration precaution.  Per daughter patient shock the other day, after she was getting pain meds.   Major depression: Continue with Zoloft.   Hypertension:Continue with Cozaar and Norvasc.   Postoperative hypothyroidism: continue with synthroid.         Estimated body mass index is 20.64 kg/m as calculated from the following:   Height as of this encounter: 5\' 6"  (1.676 m).   Weight as of this encounter: 58 kg.   DVT prophylaxis: heparin  Code Status: DNR Family Communication: kristal over phone.  Disposition Plan:  Status is: Observation The patient remains OBS appropriate  and will d/c before 2 midnights.    Consultants:  Ortho   Procedures:    Antimicrobials:    Subjective: She has been having knee pain. She was started on morphine, which make her more confusion.  Per daughter patient was checked for a UTI on Monday by the assisted living facility, due to concern for confusion in the urine smell bad.  She was referred to the ED for further evaluation of urine infection.  Family doesn't know if they  will resume hospice care at discharge.  Patient reports left knee pain also right knee pain.  She appears very weak and tired.  She does not feel well,  but does not elaborate  Objective: Vitals:   01/19/2023 1930 02/04/2023 2102 01/08/2023 2326 01/19/23 0424  BP: (!) 138/99 (!) 163/75 127/78 128/73  Pulse: (!) 105 (!) 116 (!) 106 (!) 103  Resp:  20 18 16   Temp:  97.8 F (36.6 C) 97.7 F (36.5 C) 98.3 F (36.8 C)  TempSrc:  Oral Oral Oral  SpO2: 100% 98% 100% 100%  Weight:      Height:       No intake or output data in the 24 hours ending 01/19/23 0710 Filed Weights   01/27/2023 1617  Weight: 58 kg    Examination:  General exam: frail, chronic ill appearing.  Respiratory system: Clear to auscultation. Respiratory effort normal. Cardiovascular system: S1 & S2 heard, RRR.  Gastrointestinal system: Abdomen is nondistended, soft and nontender. No organomegaly or masses felt. Normal bowel sounds heard. Central nervous system: Alert.  Extremities: left knee with effusion.   Data Reviewed: I have personally reviewed following labs  and imaging studies  CBC: Recent Labs  Lab 01/13/2023 1718 01/22/2023 2133 01/19/23 0458  WBC 12.9*  --  19.7*  NEUTROABS  --   --  17.9*  HGB 13.3 13.4 13.0  HCT 42.5 44.1 41.5  MCV 97.5  --  96.7  PLT 309  --  367   Basic Metabolic Panel: Recent Labs  Lab 01/07/2023 1718 01/19/23 0458  NA 133* 134*  K 5.1 5.0  CL 98 99  CO2 26 24  GLUCOSE 114* 148*  BUN 18 23  CREATININE 1.14* 0.97  CALCIUM 10.4* 10.3  MG   --  2.1   GFR: Estimated Creatinine Clearance: 33.9 mL/min (by C-G formula based on SCr of 0.97 mg/dL). Liver Function Tests: Recent Labs  Lab 01/19/23 0458  AST 11*  ALT 13  ALKPHOS 106  BILITOT 0.8  PROT 6.8  ALBUMIN 3.2*   No results for input(s): "LIPASE", "AMYLASE" in the last 168 hours. No results for input(s): "AMMONIA" in the last 168 hours. Coagulation Profile: No results for input(s): "INR", "PROTIME" in the last 168 hours. Cardiac Enzymes: No results for input(s): "CKTOTAL", "CKMB", "CKMBINDEX", "TROPONINI" in the last 168 hours. BNP (last 3 results) No results for input(s): "PROBNP" in the last 8760 hours. HbA1C: No results for input(s): "HGBA1C" in the last 72 hours. CBG: No results for input(s): "GLUCAP" in the last 168 hours. Lipid Profile: No results for input(s): "CHOL", "HDL", "LDLCALC", "TRIG", "CHOLHDL", "LDLDIRECT" in the last 72 hours. Thyroid Function Tests: Recent Labs    01/15/2023 1718  TSH 2.027   Anemia Panel: No results for input(s): "VITAMINB12", "FOLATE", "FERRITIN", "TIBC", "IRON", "RETICCTPCT" in the last 72 hours. Sepsis Labs: No results for input(s): "PROCALCITON", "LATICACIDVEN" in the last 168 hours.  No results found for this or any previous visit (from the past 240 hour(s)).       Radiology Studies: DG Chest Portable 1 View  Result Date: 01/28/2023 CLINICAL DATA:  Aspiration. EXAM: PORTABLE CHEST 1 VIEW COMPARISON:  12/02/2022 FINDINGS: Large esophageal hiatal hernia. Heart size and pulmonary vascularity are normal. Central interstitial changes in the lungs likely representing chronic bronchitis. No developing consolidation or airspace disease. No pleural effusions. No pneumothorax. Old rib fractures. Degenerative changes in the spine and shoulder. Postoperative changes in the lumbar spine. IMPRESSION: Large esophageal hiatal hernia. Chronic bronchitic changes in the lungs. No focal consolidation. Electronically Signed   By:  Burman Nieves M.D.   On: 01/07/2023 18:15   DG Knee Complete 4 Views Left  Result Date: 01/29/2023 CLINICAL DATA:  Knee effusion. EXAM: LEFT KNEE - COMPLETE 4+ VIEW COMPARISON:  10/03/2019 FINDINGS: Left knee arthroplasty. Vascular calcifications. Moderate to large suprapatellar joint effusion, new. IMPRESSION: Left knee arthroplasty, with new suprapatellar joint effusion. Electronically Signed   By: Jeronimo Greaves M.D.   On: 01/08/2023 18:15        Scheduled Meds:  amLODipine  5 mg Oral Daily   aspirin  81 mg Oral Daily   famotidine  40 mg Oral QHS   heparin  5,000 Units Subcutaneous Q8H   levothyroxine  100 mcg Oral Daily   losartan  50 mg Oral Daily   montelukast  10 mg Oral QHS   pantoprazole  40 mg Oral Daily   predniSONE  5 mg Oral Q breakfast   rosuvastatin  10 mg Oral QHS   sertraline  50 mg Oral Daily   Continuous Infusions:  sodium chloride     cefTRIAXone (ROCEPHIN)  IV  LOS: 0 days    Time spent: 35 minutes     Satchel Heidinger A Micheline Markes, MD [facility]   If 7PM-7AM, please contact night-coverage [email or web address]  01/19/2023, 7:10 AM

## 2023-01-19 NOTE — Assessment & Plan Note (Signed)
-   UA indicative of UTI - Altered mental status - Urine culture pending - Leukocytosis 12.9 - Rocephin started in the ED - Continue Rocephin - Continue to monitor

## 2023-01-19 NOTE — H&P (Signed)
History and Physical    Patient: Lindsey Butler ZOX:[id number] DOB: 08-01-1929 DOA: 01/24/2023 DOS: the patient was seen and examined on 01/19/2023 PCP: Irven Baltimore, NP  Patient coming from: Hospice  Chief Complaint:  Chief Complaint  Patient presents with   Urinary Frequency   HPI: Lindsey Butler is a 89 y.o. female with medical history significant of asthma, GERD, hyperlipidemia, hypothyroidism, dementia, and more presents the ED with a chief complaint of UTI.  On chart review patient was living with an ALF back in May 2024.  She had right facial droop at that time.  Patient is blind at baseline.  She had no other acute deficits.  The facial droop improved on arrival to the ED.  At that time patient was oriented to place, age, birthdate, but was disoriented to month and year.  She had negative head CT at that time.  Discharge summary on Jan 06, 2023 shows that she was discharged home with home health.  Her CODE STATUS is DNR.  She does have a DNR on file.  The triage note for the ER said that she was coming in from hospice to have a UTI treated.  Hopefully during the day family can clarify more of these details.  Patient tells me that she is in the hospital because she is sick.  She does not know what hospital she is in.  She is able to tell me her name, but not the year.  She does not member symptoms prior to coming into the hospital.  She reports that right now she is not in any pain.  Patient is DNR with DNR and ACP documents Review of Systems: unable to review all systems due to the inability of the patient to answer questions. Past Medical History:  Diagnosis Date   Actinic keratosis    Asthma    GERD (gastroesophageal reflux disease)    Hyperlipidemia    Hypothyroidism    Osteoarthritis    Past Surgical History:  Procedure Laterality Date   CATARACT EXTRACTION     left and right   HIP ARTHROPLASTY Left 10/04/2019   Procedure: ARTHROPLASTY BIPOLAR HIP (HEMIARTHROPLASTY);   Surgeon: Vickki Hearing, MD;  Location: [facility];  Service: Orthopedics;  Laterality: Left;   THYROIDECTOMY     TONSILLECTOMY     TOTAL KNEE ARTHROPLASTY     right and left knee   VAGINAL HYSTERECTOMY     Social History:  reports that she has never smoked. She has never used smokeless tobacco. She reports that she does not drink alcohol and does not use drugs.  No Known Allergies  Family History  Problem Relation Age of Onset   Cancer Son    Liver cancer Brother     Prior to Admission medications   Medication Sig Start Date End Date Taking? Authorizing Provider  acetaminophen (TYLENOL) 500 MG tablet Take 2 tablets (1,000 mg total) by mouth every 8 (eight) hours. 09/09/22   Vassie Loll, MD  albuterol (VENTOLIN HFA) 108 (90 Base) MCG/ACT inhaler Inhale 2 puffs into the lungs every 6 (six) hours as needed for wheezing or shortness of breath. 01/06/23   Ghimire, Werner Lean, MD  amLODipine (NORVASC) 5 MG tablet Take 1 tablet (5 mg total) by mouth daily. 01/07/23   Ghimire, Werner Lean, MD  aspirin 81 MG chewable tablet Chew 1 tablet (81 mg total) by mouth daily. 01/07/23   Ghimire, Werner Lean, MD  calcium carbonate (OSCAL) 1500 (600 Ca) MG TABS tablet Take  1,500 mg by mouth daily.    [provider]  cholecalciferol (VITAMIN D) 25 MCG (1000 UNIT) tablet Take 1,000 Units by mouth daily. 11/07/20   [provider]  famotidine (PEPCID) 40 MG tablet Take 40 mg by mouth at bedtime.    [provider]  lactobacillus acidophilus (BACID) TABS tablet Take 1 tablet by mouth 3 (three) times daily.    [provider]  levothyroxine (SYNTHROID) 100 MCG tablet Take 100 mcg by mouth daily. 08/19/22   [provider]  losartan (COZAAR) 50 MG tablet Take 50 mg by mouth daily. 08/19/22   [provider]  meloxicam (MOBIC) 15 MG tablet Take 15 mg by mouth daily.    [provider]  montelukast (SINGULAIR) 10 MG tablet Take 1 tablet (10 mg total) by mouth at  bedtime. 11/01/19   Sharee Holster, NP  omeprazole (PRILOSEC) 40 MG capsule Take 40 mg by mouth daily.    [provider]  predniSONE (DELTASONE) 5 MG tablet Take 5 mg by mouth daily. 08/19/22   [provider]  rosuvastatin (CRESTOR) 10 MG tablet Take 10 mg by mouth at bedtime. 08/19/22   [provider]  senna (SENOKOT) 8.6 MG TABS tablet Take 1 tablet (8.6 mg total) by mouth 2 (two) times daily. Hold for diarrhea. 09/09/22   Vassie Loll, MD  sertraline (ZOLOFT) 50 MG tablet Take 50 mg by mouth daily. 08/19/22   [provider]  vitamin B-12 (CYANOCOBALAMIN) 1000 MCG tablet Take 1,000 mcg by mouth daily. 02/24/21   [provider]    Physical Exam: Vitals:   01/30/2023 1930 01/09/2023 2102 01/17/2023 2326 01/19/23 0424  BP: (!) 138/99 (!) 163/75 127/78 128/73  Pulse: (!) 105 (!) 116 (!) 106 (!) 103  Resp:  20 18 16   Temp:  97.8 F (36.6 C) 97.7 F (36.5 C) 98.3 F (36.8 C)  TempSrc:  Oral Oral Oral  SpO2: 100% 98% 100% 100%  Weight:      Height:       1.  General: Patient lying supine in bed,  no acute distress   2. Psychiatric: Alert and oriented x person and place, mood and behavior normal for situation, pleasant and cooperative with exam   3. Neurologic: Speech and language are normal, face is symmetric, moves all 4 extremities voluntarily, at baseline without acute deficits on limited exam   4. HEENMT:  Head is atraumatic, normocephalic, pupils reactive to light, neck is supple, trachea is midline, mucous membranes are moist   5. Respiratory : Lungs are clear to auscultation bilaterally without wheezing, rhonchi, rales, no cyanosis, no increase in work of breathing or accessory muscle use   6. Cardiovascular : Heart rate normal, rhythm is regular, murmur present, rubs or gallops, no peripheral edema, peripheral pulses palpated   7. Gastrointestinal:  Abdomen is soft, nondistended, nontender to palpation bowel sounds active, no  masses or organomegaly palpated   8. Skin:  Skin is warm, dry and intact without rashes, acute lesions, or ulcers on limited exam   9.Musculoskeletal:  No acute deformities or trauma, no asymmetry in tone, no peripheral edema, peripheral pulses palpated, no tenderness to palpation in the extremities  Data Reviewed: In the ED Temp 97.4, heart rate 91-100, respiratory rate 18, blood pressure 107/70 1-20 37/81, satting 93-99% with 2 L nasal cannula Patient was started on 2 L nasal cannula few days ago for comfort per her report Patient is a leukocytosis at nearly 13, hemoglobin 13.3  which is likely hemoconcentration Chemistry reveals a borderline potassium at 5.1 UA indicative of UTI Knee x-ray shows suprapatellar joint effusion Is reported the patient is normally with it, but she has been confused for a couple of days.  It is noted that she was on hospice and removed from hospice to have a UTI treated.  Is unclear what the terminal illness may be. Assessment and Plan: * Acute metabolic encephalopathy - Likely related to UTI - Continue treatment of UTI - Chest x-ray shows large hiatal hernia with chronic bronchitic changes and no focal consolidation - There was concern for an aspiration event, so patient's white blood cell count should trend up, or if patient should develop a cough, may be worth checking a chest x-ray again - TSH 2.027 - Unclear baseline - Continue to monitor  UTI (urinary tract infection) - UA indicative of UTI - Altered mental status - Urine culture pending - Leukocytosis 12.9 - Rocephin started in the ED - Continue Rocephin - Continue to monitor  Hyperlipidemia LDL goal <130 - Continue statin  Hiatal hernia with GERD without esophagitis - Continue PPI  Major depression, recurrent, chronic (HCC) - Continue Zoloft  Essential hypertension - Continue Norvasc and losartan  Post-operative hypothyroidism - Continue Synthroid - TSH 2.027 - Continue to  monitor      Advance Care Planning:   Code Status: DNR  Consults: None at the time  Family Communication: No family at bedside  Severity of Illness: The appropriate patient status for this patient is OBSERVATION. Observation status is judged to be reasonable and necessary in order to provide the required intensity of service to ensure the patient's safety. The patient's presenting symptoms, physical exam findings, and initial radiographic and laboratory data in the context of their medical condition is felt to place them at decreased risk for further clinical deterioration. Furthermore, it is anticipated that the patient will be medically stable for discharge from the hospital within 2 midnights of admission.   Author: Lilyan Gilford, DO 01/19/2023 6:02 AM  For on call review [email or web address].

## 2023-01-19 NOTE — Assessment & Plan Note (Signed)
Continue Zoloft 

## 2023-01-19 NOTE — TOC Initial Note (Signed)
Transition of Care [facility]) - Initial/Assessment Note    Patient Details  Name: Lindsey Butler MRN: [id number] Date of Birth: [birth year]  Transition of [facility]) CM/SW Contact:    Karn Cassis, LCSW Phone Number: 01/19/2023, 2:50 PM  Clinical Narrative: Pt admitted due to UTI/acute metabolic encephalopathy. LCSW spoke with pt's daughter-in-law, Adella Nissen who reports she is HCPOA. Adella Nissen said pt has been a resident at [facility] for several months. She has been followed by [facility], but revoked hospice to come to hospital. Adella Nissen is unsure if they will want hospice services when discharged from hospital. She requests return to [facility]. LCSW left voicemail for Chasity at ALF requesting return call. Also requested they plan to assess pt in AM for return. PT evaluation pending. TOC will follow.                   Expected Discharge Plan: Assisted Living Barriers to Discharge: Continued Medical Work up   Patient Goals and CMS Choice Patient states their goals for this hospitalization and ongoing recovery are:: return to ALF   Choice offered to / list presented to : Adult Children [place] ownership interest in [facility].provided to::  (n/a)    Expected Discharge Plan and Services       Living arrangements for the past 2 months: Assisted Living Facility                                      Prior Living Arrangements/Services Living arrangements for the past 2 months: Assisted Living Facility Lives with:: Facility Resident Patient language and need for interpreter reviewed:: Yes Do you feel safe going back to the place where you live?: Yes      Need for Family Participation in Patient Care: Yes (Comment) Care giver support system in place?: Yes (comment)   Criminal Activity/Legal Involvement Pertinent to Current Situation/Hospitalization: No - Comment as needed  Activities of Daily Living   ADL Screening (condition at time of  admission) Is the patient deaf or have difficulty hearing?: No Does the patient have difficulty seeing, even when wearing glasses/contacts?: Yes Does the patient have difficulty concentrating, remembering, or making decisions?: No Patient able to express need for assistance with ADLs?: Yes Does the patient have difficulty dressing or bathing?: Yes Independently performs ADLs?: No Does the patient have difficulty walking or climbing stairs?: Yes Weakness of Legs: Both Weakness of Arms/Hands: None  Permission Sought/Granted                  Emotional Assessment         Alcohol / Substance Use: Not Applicable Psych Involvement: No (comment)  Admission diagnosis:  Acute metabolic encephalopathy [G93.41] Patient Active Problem List   Diagnosis Date Noted   UTI (urinary tract infection) 01/19/2023   Acute metabolic encephalopathy 01/25/2023   TIA (transient ischemic attack) 01/05/2023   Gram-negative bacteremia 11/27/2022   Lobar pneumonia (HCC) 11/26/2022   Chronic heart failure with preserved ejection fraction (HFpEF) (HCC) 09/13/2022   Benign hypertension with coincident congestive heart failure (HCC) 09/13/2022   Aortic atherosclerosis (HCC) 09/13/2022   Hyperlipidemia LDL goal <130 09/13/2022   Pelvic fracture (HCC) 09/08/2022   Post-operative hypothyroidism 10/13/2019   Essential hypertension 10/13/2019   Major depression, recurrent, chronic (HCC) 10/13/2019   Hiatal hernia with GERD without esophagitis 10/13/2019   Increased intraocular pressure, bilateral 10/13/2019   Lumbar spondylosis  05/01/2018   Spinal stenosis of lumbar region without neurogenic claudication 09/27/2016   Basal cell carcinoma of skin of other parts of face 08/29/2016   Neuralgia, neuritis or radiculitis 03/27/2014   Osteoporosis 10/28/2013   Hypothyroidism 07/17/2013   Asthma 09/14/2012   Diverticulosis of colon 05/05/2011   PCP:  Irven Baltimore, NP Pharmacy:   [facility] [place],  [place] - [address] [place] [address] Phone: [phone] Fax: [phone]     Social Determinants of Health (SDOH) Social History: SDOH Screenings   Food Insecurity: No Food Insecurity (01/05/2023)  Housing: Low Risk  (01/05/2023)  Transportation Needs: No Transportation Needs (01/05/2023)  Utilities: Not At Risk (01/05/2023)  Tobacco Use: Low Risk  (01/24/2023)   SDOH Interventions:     Readmission Risk Interventions     No data to display

## 2023-01-19 NOTE — Progress Notes (Signed)
Patient ID: Lindsey Butler, female   DOB: March 25, 1929, 89 y.o.   MRN: [id number]  BP 132/83 (BP Location: Right Arm)   Pulse (!) 102   Temp 98.2 F (36.8 C) (Oral)   Resp 19   Ht 5\' 6"  (1.676 m)   Wt 58 kg   SpO2 100%   BMI 20.64 kg/m   Larey Seat ?? When   One provider available  I will see the px when I can

## 2023-01-19 NOTE — Assessment & Plan Note (Signed)
Continue statin. 

## 2023-01-20 ENCOUNTER — Inpatient Hospital Stay (HOSPITAL_COMMUNITY): Payer: Medicare Other

## 2023-01-20 DIAGNOSIS — Z66 Do not resuscitate: Secondary | ICD-10-CM | POA: Insufficient documentation

## 2023-01-20 DIAGNOSIS — M25462 Effusion, left knee: Secondary | ICD-10-CM | POA: Diagnosis not present

## 2023-01-20 DIAGNOSIS — G9341 Metabolic encephalopathy: Secondary | ICD-10-CM | POA: Diagnosis not present

## 2023-01-20 LAB — BASIC METABOLIC PANEL
Anion gap: 14 (ref 5–15)
BUN: 29 mg/dL — ABNORMAL HIGH (ref 8–23)
CO2: 21 mmol/L — ABNORMAL LOW (ref 22–32)
Calcium: 10.5 mg/dL — ABNORMAL HIGH (ref 8.9–10.3)
Chloride: 101 mmol/L (ref 98–111)
Creatinine, Ser: 0.92 mg/dL (ref 0.44–1.00)
GFR, Estimated: 58 mL/min — ABNORMAL LOW (ref >60)
Glucose, Bld: 179 mg/dL — ABNORMAL HIGH (ref 70–99)
Potassium: 4.4 mmol/L (ref 3.5–5.1)
Sodium: 136 mmol/L (ref 135–145)

## 2023-01-20 LAB — URINE CULTURE: Culture: >=100000 — AB

## 2023-01-20 LAB — CBC
HCT: 46.5 % — ABNORMAL HIGH (ref 36.0–46.0)
Hemoglobin: 14.3 g/dL (ref 12.0–15.0)
MCH: 30.7 pg (ref 26.0–34.0)
MCHC: 30.8 g/dL (ref 30.0–36.0)
MCV: 99.8 fL (ref 80.0–100.0)
Platelets: 445 10*3/uL — ABNORMAL HIGH (ref 150–400)
RBC: 4.66 MIL/uL (ref 3.87–5.11)
RDW: 14.5 % (ref 11.5–15.5)
WBC: 10.6 10*3/uL — ABNORMAL HIGH (ref 4.0–10.5)
nRBC: 0 % (ref 0.0–0.2)

## 2023-01-20 MED ORDER — PANTOPRAZOLE SODIUM 40 MG IV SOLR: 40.0000 mg | Freq: Two times a day (BID) | INTRAVENOUS | Status: DC

## 2023-01-20 MED ORDER — DEXTROSE IN LACTATED RINGERS 5 % IV SOLN: INTRAVENOUS | Status: DC

## 2023-01-20 MED ORDER — METHYLPREDNISOLONE SODIUM SUCC 40 MG IJ SOLR
40.0000 mg | Freq: Every day | INTRAMUSCULAR | Status: DC
  Administered 2023-01-20: 40 mg via INTRAVENOUS
  Filled 2023-01-20: qty 1

## 2023-01-20 MED ORDER — METOCLOPRAMIDE HCL 5 MG/ML IJ SOLN
5.0000 mg | Freq: Four times a day (QID) | INTRAMUSCULAR | Status: DC | PRN
  Administered 2023-01-20: 5 mg via INTRAVENOUS
  Filled 2023-01-20: qty 2

## 2023-01-21 LAB — URINE CULTURE

## 2023-02-06 NOTE — TOC Progression Note (Signed)
Transition of Care [facility]) - Progression Note    Patient Details  Name: MICHALINA HUSCHER MRN: [id number] Date of Birth: [birth year]  Transition of Care [facility]) CM/SW Contact  Elliot Gault, LCSW Phone Number: 01/13/2023, 10:40 AM  Clinical Narrative:     Received call from Chasity at [facility] where pt resides requesting update on pt as she was getting ready to come over and assess her for return to the facility.   Reviewed pt's chart and provided update on pt's passing to Chasity.  Expected Discharge Plan: Assisted Living Barriers to Discharge: Continued Medical Work up  Expected Discharge Plan and Services       Living arrangements for the past 2 months: Assisted Living Facility                                       Social Determinants of Health (SDOH) Interventions SDOH Screenings   Food Insecurity: No Food Insecurity (01/05/2023)  Housing: Low Risk  (01/05/2023)  Transportation Needs: No Transportation Needs (01/05/2023)  Utilities: Not At Risk (01/05/2023)  Tobacco Use: Low Risk  (01/19/2023)    Readmission Risk Interventions     No data to display

## 2023-02-06 NOTE — Progress Notes (Signed)
Patient ID: Lindsey Butler, female   DOB: [birth year], 89 y.o.   MRN: [id number]   Left knee effusion evaluated  X-rays show underlying prosthesis with large joint effusion  The knee does not appear to be infected.  There is no erythema cellulitis or warmth to the joint  Patient complains of minimal discomfort with palpation  Patient can perform leg lift with no extensor lag there is no palpable defects in the quadriceps or patellar tendon  Patient can ambulate as tolerated and follow-up in the office for further treatment

## 2023-02-06 NOTE — Care Management Important Message (Signed)
Important Message  Patient Details  Name: Lindsey Butler MRN: [id number] Date of Birth: 02/10/1929   Medicare Important Message Given:  N/A - LOS <3 / Initial given by admissions     Corey Harold 01/30/2023, 4:11 PM

## 2023-02-06 NOTE — Consult Note (Addendum)
Reason for Consult/referring: left knee pain /Dr Sunnie Nielsen    Lindsey Butler is an 89 y.o. female.  HPI: 89 yo F frequent falls with left knee swelling. H/o LTKA   Does not c/o pain   Past Medical History:  Diagnosis Date   Actinic keratosis    Asthma    GERD (gastroesophageal reflux disease)    Hyperlipidemia    Hypothyroidism    Osteoarthritis     Past Surgical History:  Procedure Laterality Date   CATARACT EXTRACTION     left and right   HIP ARTHROPLASTY Left 10/04/2019   Procedure: ARTHROPLASTY BIPOLAR HIP (HEMIARTHROPLASTY);  Surgeon: Vickki Hearing, MD;  Location: [facility];  Service: Orthopedics;  Laterality: Left;   THYROIDECTOMY     TONSILLECTOMY     TOTAL KNEE ARTHROPLASTY     right and left knee   VAGINAL HYSTERECTOMY      Family History  Problem Relation Age of Onset   Cancer Son    Liver cancer Brother     Social History:  reports that she has never smoked. She has never used smokeless tobacco. She reports that she does not drink alcohol and does not use drugs.  Allergies: No Known Allergies  Medications:  Current Outpatient Medications  Medication Instructions   acetaminophen (TYLENOL) 1,000 mg, Oral, Every 8 hours   albuterol (VENTOLIN HFA) 108 (90 Base) MCG/ACT inhaler 2 puffs, Inhalation, Every 6 hours PRN   amLODipine (NORVASC) 5 mg, Oral, Daily   aspirin 81 mg, Oral, Daily   calcium carbonate (OSCAL) 1,500 mg, Oral, Daily   cholecalciferol (VITAMIN D3) 1,000 Units, Oral, Daily   cyanocobalamin (VITAMIN B12) 1,000 mcg, Oral, Daily   famotidine (PEPCID) 40 mg, Oral, Daily at bedtime   lactobacillus acidophilus (BACID) TABS tablet 1 tablet, Oral, 3 times daily   levothyroxine (SYNTHROID) 100 mcg, Oral, Daily   losartan (COZAAR) 50 mg, Oral, Daily   meloxicam (MOBIC) 15 mg, Oral, Daily   montelukast (SINGULAIR) 10 mg, Oral, Daily at bedtime   predniSONE (DELTASONE) 5 mg, Oral, Daily   rosuvastatin (CRESTOR) 10 mg, Oral, Daily at bedtime    senna (SENOKOT) 8.6 mg, Oral, 2 times daily, Hold for diarrhea.   sertraline (ZOLOFT) 50 mg, Oral, Daily     Results for orders placed or performed during the hospital encounter of 01/27/2023 (from the past 48 hour(s))  CBC     Status: Abnormal   Collection Time: 01/17/2023  5:18 PM  Result Value Ref Range   WBC 12.9 (H) 4.0 - 10.5 K/uL   RBC 4.36 3.87 - 5.11 MIL/uL   Hemoglobin 13.3 12.0 - 15.0 g/dL   HCT 16.1 09.6 - 04.5 %   MCV 97.5 80.0 - 100.0 fL   MCH 30.5 26.0 - 34.0 pg   MCHC 31.3 30.0 - 36.0 g/dL   RDW 40.9 81.1 - 91.4 %   Platelets 309 150 - 400 K/uL   nRBC 0.0 0.0 - 0.2 %    Comment: Performed at [facility], [address], [place], [place] [address]  Basic metabolic panel     Status: Abnormal   Collection Time: 02/05/2023  5:18 PM  Result Value Ref Range   Sodium 133 (L) 135 - 145 mmol/L   Potassium 5.1 3.5 - 5.1 mmol/L   Chloride 98 98 - 111 mmol/L   CO2 26 22 - 32 mmol/L   Glucose, Bld 114 (H) 70 - 99 mg/dL    Comment: Glucose reference range applies only to  samples taken after fasting for at least 8 hours.   BUN 18 8 - 23 mg/dL   Creatinine, Ser 1.61 (H) 0.44 - 1.00 mg/dL   Calcium 09.6 (H) 8.9 - 10.3 mg/dL   GFR, Estimated 45 (L) >60 mL/min    Comment: (NOTE) Calculated using the CKD-EPI Creatinine Equation (2021)    Anion gap 9 5 - 15    Comment: Performed at [facility], [address] [place], [place] [address]  TSH     Status: None   Collection Time: 01/22/2023  5:18 PM  Result Value Ref Range   TSH 2.027 0.350 - 4.500 uIU/mL    Comment: Performed by a 3rd Generation assay with a functional sensitivity of <=0.01 uIU/mL. Performed at [facility], [address] [place], [place] [address]   Urinalysis, Routine w reflex microscopic -Urine, Clean Catch     Status: Abnormal   Collection Time: 01/22/2023  6:20 PM  Result Value Ref Range   Color, Urine YELLOW YELLOW   APPearance CLOUDY (A) CLEAR   Specific Gravity, Urine 1.009 1.005 - 1.030   pH 5.0 5.0 - 8.0    Glucose, UA NEGATIVE NEGATIVE mg/dL   Hgb urine dipstick SMALL (A) NEGATIVE   Bilirubin Urine NEGATIVE NEGATIVE   Ketones, ur NEGATIVE NEGATIVE mg/dL   Protein, ur 30 (A) NEGATIVE mg/dL   Nitrite NEGATIVE NEGATIVE   Leukocytes,Ua LARGE (A) NEGATIVE   RBC / HPF 0-5 0 - 5 RBC/hpf   WBC, UA >50 0 - 5 WBC/hpf   Bacteria, UA FEW (A) NONE SEEN   Squamous Epithelial / HPF 0-5 0 - 5 /HPF    Comment: Performed at [facility], [address] [place], [place] [address]  Hemoglobin and hematocrit, blood     Status: None   Collection Time: 01/23/2023  9:33 PM  Result Value Ref Range   Hemoglobin 13.4 12.0 - 15.0 g/dL   HCT 95.6 21.3 - 08.6 %    Comment: Performed at [facility], [address], [place], [place] [address]  Comprehensive metabolic panel     Status: Abnormal   Collection Time: 01/19/23  4:58 AM  Result Value Ref Range   Sodium 134 (L) 135 - 145 mmol/L   Potassium 5.0 3.5 - 5.1 mmol/L   Chloride 99 98 - 111 mmol/L   CO2 24 22 - 32 mmol/L   Glucose, Bld 148 (H) 70 - 99 mg/dL    Comment: Glucose reference range applies only to samples taken after fasting for at least 8 hours.   BUN 23 8 - 23 mg/dL   Creatinine, Ser 9.62 0.44 - 1.00 mg/dL   Calcium 95.2 8.9 - 84.1 mg/dL   Total Protein 6.8 6.5 - 8.1 g/dL   Albumin 3.2 (L) 3.5 - 5.0 g/dL   AST 11 (L) 15 - 41 U/L   ALT 13 0 - 44 U/L   Alkaline Phosphatase 106 38 - 126 U/L   Total Bilirubin 0.8 0.3 - 1.2 mg/dL   GFR, Estimated 55 (L) >60 mL/min    Comment: (NOTE) Calculated using the CKD-EPI Creatinine Equation (2021)    Anion gap 11 5 - 15    Comment: Performed at [facility], [address] [place], [place] [address]  Magnesium     Status: None   Collection Time: 01/19/23  4:58 AM  Result Value Ref Range   Magnesium 2.1 1.7 - 2.4 mg/dL    Comment: Performed at [facility], [address], [place], [place]  [address]  CBC with Differential/Platelet     Status: Abnormal   Collection Time: 01/19/23  4:58 AM  Result Value  Ref Range   WBC 19.7 (H) 4.0 - 10.5 K/uL   RBC 4.29 3.87 - 5.11 MIL/uL   Hemoglobin 13.0 12.0 - 15.0 g/dL   HCT 60.4 54.0 - 98.1 %   MCV 96.7 80.0 - 100.0 fL   MCH 30.3 26.0 - 34.0 pg   MCHC 31.3 30.0 - 36.0 g/dL   RDW 19.1 47.8 - 29.5 %   Platelets 367 150 - 400 K/uL   nRBC 0.0 0.0 - 0.2 %   Neutrophils Relative % 90 %   Neutro Abs 17.9 (H) 1.7 - 7.7 K/uL   Lymphocytes Relative 2 %   Lymphs Abs 0.4 (L) 0.7 - 4.0 K/uL   Monocytes Relative 6 %   Monocytes Absolute 1.1 (H) 0.1 - 1.0 K/uL   Eosinophils Relative 1 %   Eosinophils Absolute 0.2 0.0 - 0.5 K/uL   Basophils Relative 0 %   Basophils Absolute 0.1 0.0 - 0.1 K/uL   Immature Granulocytes 1 %   Abs Immature Granulocytes 0.11 (H) 0.00 - 0.07 K/uL    Comment: Performed at [facility], [address], [place], [place] [address]  Uric acid     Status: None   Collection Time: 01/19/23  6:09 AM  Result Value Ref Range   Uric Acid, Serum 5.2 2.5 - 7.1 mg/dL    Comment: Performed at [facility], [address], [place], [place] [address]    DG Chest Portable 1 View  Result Date: 01/22/2023 CLINICAL DATA:  Aspiration. EXAM: PORTABLE CHEST 1 VIEW COMPARISON:  12/02/2022 FINDINGS: Large esophageal hiatal hernia. Heart size and pulmonary vascularity are normal. Central interstitial changes in the lungs likely representing chronic bronchitis. No developing consolidation or airspace disease. No pleural effusions. No pneumothorax. Old rib fractures. Degenerative changes in the spine and shoulder. Postoperative changes in the lumbar spine. IMPRESSION: Large esophageal hiatal hernia. Chronic bronchitic changes in the lungs. No focal consolidation. Electronically Signed   By: Burman Nieves M.D.   On: 01/23/2023 18:15   DG Knee Complete 4 Views Left  Result Date: 01/10/2023 CLINICAL DATA:  Knee effusion. EXAM: LEFT KNEE - COMPLETE 4+ VIEW COMPARISON:  10/03/2019 FINDINGS: Left knee arthroplasty. Vascular calcifications. Moderate to large  suprapatellar joint effusion, new. IMPRESSION: Left knee arthroplasty, with new suprapatellar joint effusion. Electronically Signed   By: Jeronimo Greaves M.D.   On: 01/22/2023 18:15    Review of Systems  Constitutional:  Negative for chills and fever.  Respiratory:  Positive for shortness of breath, wheezing and stridor.    Blood pressure (!) 140/90, pulse (!) 107, temperature 98.3 F (36.8 C), resp. rate 18, height 5\' 6"  (1.676 m), weight 58 kg, SpO2 100 %. Physical Exam Constitutional:      Appearance: She is cachectic.     Interventions: Nasal cannula in place.  HENT:     Head: Normocephalic and atraumatic.  Eyes:     General:        Right eye: No discharge.        Left eye: No discharge.     Pupils: Pupils are equal, round, and reactive to light.  Cardiovascular:     Rate and Rhythm: Normal rate.     Pulses: Normal pulses.  Pulmonary:     Breath sounds: Wheezing present.  Musculoskeletal:     Left knee: Swelling and effusion present. No deformity, erythema or  lacerations. Decreased range of motion. Tenderness present. No LCL laxity, MCL laxity, ACL laxity or PCL laxity.Normal alignment.     Comments: Left knee PT and QT intact no defect  She was able to lift the leg from the bed   Skin:    General: Skin is warm.     Capillary Refill: Capillary refill takes less than 2 seconds.     Findings: Bruising and lesion present.  Neurological:     General: No focal deficit present.     Mental Status: She is easily aroused.  Psychiatric:        Attention and Perception: Attention normal.        Mood and Affect: Mood normal.        Speech: Speech normal.        Behavior: Behavior is cooperative.     Assessment/Plan:  Left knee xrays   Independently evaluated  Left tka no loosening; no fracture  Large effusion   Assessment and plan  Posttraumatic effusion left knee no evidence of fracture or infection  Recommend weight-bear as tolerated okay to work with physical therapy  follow-up in the office for further management when she is medically stable  Fuller Canada 01/23/2023, 8:17 AM

## 2023-02-06 NOTE — Progress Notes (Signed)
OT Cancellation Note  Patient Details Name: Lindsey Butler MRN: [id number] DOB: 1928/12/27   Cancelled Treatment:    Reason Eval/Treat Not Completed: Medical issues which prohibited therapy. Pt noted to have recently vomited and was in need of medication according to nursing. Will attempt to see pt at a later time when more appropriate.   Neyah Ellerman OT, MOT   Danie Chandler 02/03/2023, 9:15 AM

## 2023-02-06 NOTE — Death Summary Note (Addendum)
DEATH SUMMARY   Patient Details  Name: Lindsey Butler MRN: [id number] DOB: February 20, 1929 WUJ:[id number], Lindsey Layman, NP Admission/Discharge Information   Admit Date:  2023/02/08  Date of Death: Date of Death: 02/10/23  Time of Death: Time of Death: 1020  Length of Stay: 1   Principle Cause of death: Aspiration PNA. Vs Arrhythmia.   Hospital Diagnoses:   Aspiration PNA Principal Problem:   Acute metabolic encephalopathy Active Problems:   Post-operative hypothyroidism   Essential hypertension   Major depression, recurrent, chronic (HCC)   Hiatal hernia with GERD without esophagitis   Chronic heart failure with preserved ejection fraction (HFpEF) (HCC)   Hyperlipidemia LDL goal <130   UTI (urinary tract infection)   DNR (do not resuscitate)      Hospital Course: 89 year old with past medical history significant for asthma, GERD, hyperlipidemia, hypothyroidism, dementia presents to the ED complaining of increased urinary frequency and concern for UTI. Patient also complaining of left knee pain.  Patient was already on hospice care at home but family rescinded on admission.   Patient was admitted with diagnosis of urinary tract infection, she was started on IV fluids and IV antibiotics.  On Feb 10, 2023 patient started to vomited dark fluid.  KUB was negative for obstruction.  Patient appear chronically ill.  Prednisone was changed to IV Solu-Medrol due to vomiting, IV fluids were continued.  IV Protonix started.  Nurse checked on patient and gave her IV medication for nausea , and  other medications, subsequently half an hour to an hour nurse tech to check on patient and patient was found death. Suspect patient had an arrhythmia or aspiration event.     Assessment and Plan:  1-Urinary tract infection: Patient present with some confusion per report, also urine showed more than 50 white blood cell. Continue with IV ceftriaxone Urine culture. Gram negative rods.  Sepsis ruled out, no fever, no  tachypnea, tachycardia could have been form dehydration.   Acute metabolic/toxic  encephalopathy: in setting of infection and medications.  Per family patient became more confused after she was started on morphine by hospice.  Plan to hold opioids.  Tylenol for pain.    Hyperlipidemia: Continue with crestor.    Knee pain, effusion;  Xray: Left knee arthroplasty, with new suprapatellar joint effusion. Started Voltaren gel.  Recent fall.  Uric acid normal.  Ortho consulted, Dr Romeo Apple. Follow up outpatient.   Aspiration PNA  Hiatal  hernia: PPI,  Aspiration precaution.  On IV antibiotics.   Major depression: Continue with Zoloft.    Hypertension: Continue with Cozaar and Norvasc.    Postoperative hypothyroidism: Continue with synthroid.        Procedures: None  Consultations: ortho  The results of significant diagnostics from this hospitalization (including imaging, microbiology, ancillary and laboratory) are listed below for reference.   Significant Diagnostic Studies: DG Abd 1 View  Result Date: 10-Feb-2023 CLINICAL DATA:  Nausea and vomiting EXAM: ABDOMEN - 1 VIEW COMPARISON:  09/08/2022 FINDINGS: The bowel gas pattern is normal. No radio-opaque calculi or other significant radiographic abnormality are seen. Moderate volume stool throughout the colon. Prior lumbar fusion and left hip prosthesis. Subacute to chronic fractures of the left inferior pubic ramus. Bones are demineralized. IMPRESSION: 1. Nonobstructive bowel gas pattern. 2. Moderate volume stool throughout the colon. Electronically Signed   By: Duanne Guess D.O.   On: 02/10/23 09:04   DG Chest Portable 1 View  Result Date: February 08, 2023 CLINICAL DATA:  Aspiration. EXAM: PORTABLE CHEST 1 VIEW COMPARISON:  12/02/2022 FINDINGS: Large esophageal hiatal hernia. Heart size and pulmonary vascularity are normal. Central interstitial changes in the lungs likely representing chronic bronchitis. No developing  consolidation or airspace disease. No pleural effusions. No pneumothorax. Old rib fractures. Degenerative changes in the spine and shoulder. Postoperative changes in the lumbar spine. IMPRESSION: Large esophageal hiatal hernia. Chronic bronchitic changes in the lungs. No focal consolidation. Electronically Signed   By: Burman Nieves M.D.   On: 02/01/2023 18:15   DG Knee Complete 4 Views Left  Result Date: 01/21/2023 CLINICAL DATA:  Knee effusion. EXAM: LEFT KNEE - COMPLETE 4+ VIEW COMPARISON:  10/03/2019 FINDINGS: Left knee arthroplasty. Vascular calcifications. Moderate to large suprapatellar joint effusion, new. IMPRESSION: Left knee arthroplasty, with new suprapatellar joint effusion. Electronically Signed   By: Jeronimo Greaves M.D.   On: 01/24/2023 18:15   ECHOCARDIOGRAM COMPLETE  Result Date: 01/06/2023    ECHOCARDIOGRAM REPORT   Patient Name:   Lindsey Butler Date of Exam: 01/06/2023 Medical Rec #:  [id number]       Height:       66.0 in Accession #:    [id number]      Weight:       127.4 lb Date of Birth:  1928-09-03       BSA:          1.651 m Patient Age:    89 years        BP:           183/105 mmHg Patient Gender: F               HR:           102 bpm. Exam Location:  Inpatient Procedure: 2D Echo, Cardiac Doppler and Color Doppler Indications:    Stroke  History:        Patient has prior history of Echocardiogram examinations, most                 recent 03/09/2020. CHF, TIA and hx cancer, spinal stenosis; Risk                 Factors:Hypertension and Dyslipidemia.  Sonographer:    Wallie Char Referring Phys: [id number] [facility]  Sonographer Comments: Technically challenging exam due to pt confusion and movement. IMPRESSIONS  1. Left ventricular ejection fraction, by estimation, is 65 to 70%. The left ventricle has normal function. The left ventricle has no regional wall motion abnormalities. There is mild left ventricular hypertrophy of the septal segment. Left ventricular diastolic parameters  are consistent with Grade I diastolic dysfunction (impaired relaxation).  2. Right ventricular systolic function is normal. The right ventricular size is normal.  3. The mitral valve is degenerative. Moderate mitral valve regurgitation. No evidence of mitral stenosis. Moderate mitral annular calcification.  4. The noncoronary cusp is heavily calcified and fixed. The aortic valve is calcified. Aortic valve regurgitation is not visualized. Aortic valve sclerosis/calcification is present, without any evidence of aortic stenosis.  5. The inferior vena cava is normal in size with greater than 50% respiratory variability, suggesting right atrial pressure of 3 mmHg. FINDINGS  Left Ventricle: Left ventricular ejection fraction, by estimation, is 65 to 70%. The left ventricle has normal function. The left ventricle has no regional wall motion abnormalities. The left ventricular internal cavity size was normal in size. There is  mild left ventricular hypertrophy of the septal segment. Left ventricular diastolic parameters are consistent with Grade I diastolic dysfunction (impaired relaxation). Right Ventricle: The right ventricular  size is normal. No increase in right ventricular wall thickness. Right ventricular systolic function is normal. Left Atrium: Left atrial size was normal in size. Right Atrium: Right atrial size was normal in size. Pericardium: There is no evidence of pericardial effusion. Mitral Valve: The mitral valve is degenerative in appearance. There is moderate thickening of the posterior mitral valve leaflet(s). Moderate mitral annular calcification. Moderate mitral valve regurgitation. No evidence of mitral valve stenosis. MV peak  gradient, 6.2 mmHg. The mean mitral valve gradient is 2.5 mmHg. Tricuspid Valve: The tricuspid valve is normal in structure. Tricuspid valve regurgitation is not demonstrated. No evidence of tricuspid stenosis. Aortic Valve: The noncoronary cusp is heavily calcified and fixed.  The aortic valve is calcified. Aortic valve regurgitation is not visualized. Aortic valve sclerosis/calcification is present, without any evidence of aortic stenosis. Aortic valve mean gradient measures 8.5 mmHg. Aortic valve peak gradient measures 14.1 mmHg. Aortic valve area, by VTI measures 1.58 cm. Pulmonic Valve: The pulmonic valve was normal in structure. Pulmonic valve regurgitation is not visualized. No evidence of pulmonic stenosis. Aorta: The aortic root is normal in size and structure. Venous: The inferior vena cava is normal in size with greater than 50% respiratory variability, suggesting right atrial pressure of 3 mmHg. IAS/Shunts: No atrial level shunt detected by color flow Doppler.  LEFT VENTRICLE PLAX 2D LVIDd:         3.30 cm     Diastology LVIDs:         2.30 cm     LV e' medial:    4.47 cm/s LV PW:         1.00 cm     LV E/e' medial:  16.6 LV IVS:        1.30 cm     LV e' lateral:   4.71 cm/s LVOT diam:     1.80 cm     LV E/e' lateral: 15.8 LV SV:         51 LV SV Index:   31 LVOT Area:     2.54 cm  LV Volumes (MOD) LV vol d, MOD A2C: 40.5 ml LV vol d, MOD A4C: 59.6 ml LV vol s, MOD A2C: 14.9 ml LV vol s, MOD A4C: 21.2 ml LV SV MOD A2C:     25.6 ml LV SV MOD A4C:     59.6 ml LV SV MOD BP:      31.2 ml RIGHT VENTRICLE             IVC RV Basal diam:  3.60 cm     IVC diam: 2.10 cm RV S prime:     15.50 cm/s TAPSE (M-mode): 2.0 cm LEFT ATRIUM             Index        RIGHT ATRIUM           Index LA diam:        2.10 cm 1.27 cm/m   RA Area:     13.10 cm LA Vol (A2C):   70.4 ml 42.64 ml/m  RA Volume:   30.40 ml  18.41 ml/m LA Vol (A4C):   62.3 ml 37.73 ml/m LA Biplane Vol: 69.0 ml 41.79 ml/m  AORTIC VALVE AV Area (Vmax):    1.62 cm AV Area (Vmean):   1.63 cm AV Area (VTI):     1.58 cm AV Vmax:           187.50 cm/s AV Vmean:  137.500 cm/s AV VTI:            0.320 m AV Peak Grad:      14.1 mmHg AV Mean Grad:      8.5 mmHg LVOT Vmax:         119.00 cm/s LVOT Vmean:        88.200 cm/s  LVOT VTI:          0.198 m LVOT/AV VTI ratio: 0.62  AORTA Ao Root diam: 3.00 cm Ao Asc diam:  3.20 cm MITRAL VALVE MV Area (PHT): 2.88 cm     SHUNTS MV Area VTI:   1.78 cm     Systemic VTI:  0.20 m MV Peak grad:  6.2 mmHg     Systemic Diam: 1.80 cm MV Mean grad:  2.5 mmHg MV Vmax:       1.25 m/s MV Vmean:      76.3 cm/s MV Decel Time: 264 msec MV E velocity: 74.30 cm/s MV A velocity: 125.50 cm/s MV E/A ratio:  0.59 Chilton Si MD Electronically signed by Chilton Si MD Signature Date/Time: 01/06/2023/3:17:26 PM    Final    VAS US CAROTID  Result Date: 01/06/2023 Carotid Arterial Duplex Study Patient Name:  TONESHIA LICANO  Date of Exam:   01/06/2023 Medical Rec #: [id number]        Accession #:    [id number] Date of Birth: April 15, 1929        Patient Gender: F Patient Age:   27 years Exam Location:  [facility] Procedure:      VAS US CAROTID Referring Phys: Jeoffrey Massed --------------------------------------------------------------------------------  Indications:       TIA and facial droop. Risk Factors:      Hypertension, hyperlipidemia. Limitations        Today's exam was limited due to patient movement. Comparison Study:  No previous study. Performing Technologist: McKayla Maag RVT, [place]  Examination Guidelines: A complete evaluation includes B-mode imaging, spectral Doppler, color Doppler, and power Doppler as needed of all accessible portions of each vessel. Bilateral testing is considered an integral part of a complete examination. Limited examinations for reoccurring indications may be performed as noted.  Right Carotid Findings: +---------+--------+-------+--------+---------------------------------+--------+          PSV cm/sEDV    StenosisPlaque Description               Comments                  cm/s                                                     +---------+--------+-------+--------+---------------------------------+--------+ CCA Prox 59      6                                                         +---------+--------+-------+--------+---------------------------------+--------+ CCA      48      6              irregular, heterogenous and               Distal  calcific                                  +---------+--------+-------+--------+---------------------------------+--------+ ICA Prox 88      9      1-39%   heterogenous, irregular and                                               calcific                                  +---------+--------+-------+--------+---------------------------------+--------+ ICA      65      17                                                       Distal                                                                    +---------+--------+-------+--------+---------------------------------+--------+ ECA      108     13                                                       +---------+--------+-------+--------+---------------------------------+--------+ +----------+--------+-------+----------------+-------------------+           PSV cm/sEDV cmsDescribe        Arm Pressure (mmHG) +----------+--------+-------+----------------+-------------------+ [id number]             Multiphasic, WNL                    +----------+--------+-------+----------------+-------------------+ +---------+--------+--+--------+-+---------+ VertebralPSV cm/s39EDV cm/s4Antegrade +---------+--------+--+--------+-+---------+  Left Carotid Findings: +----------+-------+-------+--------+---------------------------------+--------+           PSV    EDV    StenosisPlaque Description               Comments           cm/s   cm/s                                                     +----------+-------+-------+--------+---------------------------------+--------+ CCA Prox  45     10                                                        +----------+-------+-------+--------+---------------------------------+--------+ CCA Distal38     11             irregular, heterogenous and  calcific                                  +----------+-------+-------+--------+---------------------------------+--------+ ICA Prox  35     9      1-39%   irregular, heterogenous and                                               calcific                                  +----------+-------+-------+--------+---------------------------------+--------+ ICA Distal55     15                                                       +----------+-------+-------+--------+---------------------------------+--------+ ECA       33     0                                                        +----------+-------+-------+--------+---------------------------------+--------+ +----------+--------+--------+----------------+-------------------+           PSV cm/sEDV cm/sDescribe        Arm Pressure (mmHG) +----------+--------+--------+----------------+-------------------+ [id number]             Multiphasic, WNL                    +----------+--------+--------+----------------+-------------------+ +---------+--------+--+--------+-+---------+ VertebralPSV cm/s29EDV cm/s9Antegrade +---------+--------+--+--------+-+---------+   Summary: Right Carotid: Velocities in the right ICA are consistent with a 1-39% stenosis. Left Carotid: Velocities in the left ICA are consistent with a 1-39% stenosis. Vertebrals:  Bilateral vertebral arteries demonstrate antegrade flow. Subclavians: Normal flow hemodynamics were seen in bilateral subclavian              arteries. *See table(s) above for measurements and observations.  Electronically signed by Sherald Hess MD on 01/06/2023 at 2:52:20 PM.    Final    MR BRAIN WO CONTRAST  Result Date: 01/05/2023 CLINICAL DATA:  Neuro deficit, acute, stroke suspected.  EXAM: MRI HEAD WITHOUT CONTRAST MRA HEAD WITHOUT CONTRAST TECHNIQUE: Multiplanar, multi-echo pulse sequences of the brain and surrounding structures were acquired without intravenous contrast. Angiographic images of the Circle of Willis were acquired using MRA technique without intravenous contrast. COMPARISON:  Head CT 01/05/2023. FINDINGS: MRI HEAD FINDINGS Brain: No acute infarct or hemorrhage. Moderate to severe chronic small-vessel disease no mass or midline shift. No hydrocephalus or extra-axial collection. No abnormal susceptibility. Vascular: Normal flow voids. Skull and upper cervical spine: Normal marrow signal. Sinuses/Orbits: Trace fluid in the left sphenoid sinus. Other: None. MRA HEAD FINDINGS Anterior circulation: Intracranial ICAs are patent without stenosis or aneurysm. The proximal ACAs and MCAs are patent without stenosis or aneurysm. Distal branches are symmetric. Posterior circulation: Normal basilar artery. The SCAs, AICAs and PICAs are patent proximally. The PCAs are patent proximally without stenosis or aneurysm. Distal branches are symmetric. Anatomic variants: Dominant left posterior communicating artery. IMPRESSION: 1. No acute intracranial abnormality. 2. No  intracranial large vessel occlusion or high-grade stenosis. 3. Moderate to severe chronic small-vessel disease. Electronically Signed   By: Orvan Falconer M.D.   On: 01/05/2023 19:16   MR ANGIO HEAD WO CONTRAST  Result Date: 01/05/2023 CLINICAL DATA:  Neuro deficit, acute, stroke suspected. EXAM: MRI HEAD WITHOUT CONTRAST MRA HEAD WITHOUT CONTRAST TECHNIQUE: Multiplanar, multi-echo pulse sequences of the brain and surrounding structures were acquired without intravenous contrast. Angiographic images of the Circle of Willis were acquired using MRA technique without intravenous contrast. COMPARISON:  Head CT 01/05/2023. FINDINGS: MRI HEAD FINDINGS Brain: No acute infarct or hemorrhage. Moderate to severe chronic small-vessel  disease no mass or midline shift. No hydrocephalus or extra-axial collection. No abnormal susceptibility. Vascular: Normal flow voids. Skull and upper cervical spine: Normal marrow signal. Sinuses/Orbits: Trace fluid in the left sphenoid sinus. Other: None. MRA HEAD FINDINGS Anterior circulation: Intracranial ICAs are patent without stenosis or aneurysm. The proximal ACAs and MCAs are patent without stenosis or aneurysm. Distal branches are symmetric. Posterior circulation: Normal basilar artery. The SCAs, AICAs and PICAs are patent proximally. The PCAs are patent proximally without stenosis or aneurysm. Distal branches are symmetric. Anatomic variants: Dominant left posterior communicating artery. IMPRESSION: 1. No acute intracranial abnormality. 2. No intracranial large vessel occlusion or high-grade stenosis. 3. Moderate to severe chronic small-vessel disease. Electronically Signed   By: Orvan Falconer M.D.   On: 01/05/2023 19:16   CT HEAD CODE STROKE WO CONTRAST  Result Date: 01/05/2023 CLINICAL DATA:  Code stroke.  Neuro deficit, acute, stroke suspected EXAM: CT HEAD WITHOUT CONTRAST TECHNIQUE: Contiguous axial images were obtained from the base of the skull through the vertex without intravenous contrast. RADIATION DOSE REDUCTION: This exam was performed according to the departmental dose-optimization program which includes automated exposure control, adjustment of the mA and/or kV according to patient size and/or use of iterative reconstruction technique. COMPARISON:  CT head Jan 01, 2023. FINDINGS: Brain: No evidence of acute large vascular territory infarction, hemorrhage, hydrocephalus, extra-axial collection or mass lesion/mass effect. Patchy white matter hypodensities, nonspecific but compatible with chronic microvascular ischemic disease. Vascular: No hyperdense vessel identified. Skull: No acute fracture. Sinuses/Orbits: Air-fluid levels in the sphenoid sinuses with additional scattered mild  paranasal sinus mucosal thickening. No acute orbital findings. Other: No mastoid effusions. ASPECTS [facility] Stroke Program Early CT Score) total score (0-10 with 10 being normal): 10. IMPRESSION: 1. No evidence of acute large vascular territory infarct or acute hemorrhage. ASPECTS is 10. 2. Chronic microvascular ischemic disease. Code stroke imaging results were communicated on 01/05/2023 at 4:31 pm to provider Dr. Roda Shutters via secure text paging. Electronically Signed   By: Feliberto Harts M.D.   On: 01/05/2023 16:31   CT Head Wo Contrast  Result Date: 01/01/2023 CLINICAL DATA:  Head trauma, minor (Age >= 65y). EXAM: CT HEAD WITHOUT CONTRAST TECHNIQUE: Contiguous axial images were obtained from the base of the skull through the vertex without intravenous contrast. RADIATION DOSE REDUCTION: This exam was performed according to the departmental dose-optimization program which includes automated exposure control, adjustment of the mA and/or kV according to patient size and/or use of iterative reconstruction technique. COMPARISON:  Head CT report from 03/06/2020 (images not available) FINDINGS: Brain: There is no evidence of an acute cortically based infarct, intracranial hemorrhage, mass, midline shift, or extra-axial fluid collection. Patchy and confluent hypodensities in the cerebral white matter and deep gray nuclei bilaterally are nonspecific but compatible with extensive chronic small vessel ischemic disease. Cerebral atrophy is mild for age. Vascular: Calcified  atherosclerosis at the skull base. No hyperdense vessel. Skull: No acute fracture or suspicious osseous lesion. Sinuses/Orbits: Mild mucosal thickening in the paranasal sinuses. Clear mastoid air cells. Bilateral cataract extraction. Other: None. IMPRESSION: 1. No evidence of acute intracranial abnormality. 2. Extensive chronic small vessel ischemic disease. Electronically Signed   By: Sebastian Ache M.D.   On: 01/01/2023 11:04    Microbiology: Recent  Results (from the past 240 hour(s))  Urine Culture (for pregnant, neutropenic or urologic patients or patients with an indwelling urinary catheter)     Status: Abnormal (Preliminary result)   Collection Time: 01/07/2023  9:30 PM   Specimen: Urine, Catheterized  Result Value Ref Range Status   Specimen Description   Final    URINE, CATHETERIZED Performed at [facility], [address], [place], [place] [address]    Special Requests   Final    NONE Performed at [facility], [address] [place], [place] [address]    Culture (A)  Final    >=100,000 COLONIES/mL ESCHERICHIA COLI SUSCEPTIBILITIES TO FOLLOW Performed at [facility], [address], [place], [place] [address]    Report Status PENDING  Incomplete    Time spent: 45 minutes  Signed: Alba Cory, MD 02/05/2023

## 2023-02-06 NOTE — Progress Notes (Signed)
Pt passed away 1020am, Press photographer, Md and family made aware.

## 2023-02-06 DEATH — deceased
